# Patient Record
Sex: Female | Born: 1945 | Race: Black or African American | Hispanic: No | Smoking: Former smoker
Health system: Southern US, Community
[De-identification: ages and names within clinical notes are randomized; demographics above are authoritative.]

## PROBLEM LIST (undated history)

## (undated) DIAGNOSIS — E785 Hyperlipidemia, unspecified: Secondary | ICD-10-CM

## (undated) DIAGNOSIS — M1990 Unspecified osteoarthritis, unspecified site: Secondary | ICD-10-CM

## (undated) DIAGNOSIS — R519 Headache, unspecified: Secondary | ICD-10-CM

## (undated) DIAGNOSIS — E669 Obesity, unspecified: Secondary | ICD-10-CM

## (undated) DIAGNOSIS — F0390 Unspecified dementia without behavioral disturbance: Secondary | ICD-10-CM

## (undated) DIAGNOSIS — G629 Polyneuropathy, unspecified: Secondary | ICD-10-CM

## (undated) DIAGNOSIS — J449 Chronic obstructive pulmonary disease, unspecified: Secondary | ICD-10-CM

## (undated) DIAGNOSIS — I2510 Atherosclerotic heart disease of native coronary artery without angina pectoris: Secondary | ICD-10-CM

## (undated) DIAGNOSIS — I509 Heart failure, unspecified: Secondary | ICD-10-CM

## (undated) DIAGNOSIS — G4730 Sleep apnea, unspecified: Secondary | ICD-10-CM

## (undated) DIAGNOSIS — I10 Essential (primary) hypertension: Secondary | ICD-10-CM

## (undated) DIAGNOSIS — E119 Type 2 diabetes mellitus without complications: Secondary | ICD-10-CM

## (undated) DIAGNOSIS — K449 Diaphragmatic hernia without obstruction or gangrene: Secondary | ICD-10-CM

## (undated) DIAGNOSIS — M4800 Spinal stenosis, site unspecified: Secondary | ICD-10-CM

## (undated) DIAGNOSIS — M4316 Spondylolisthesis, lumbar region: Secondary | ICD-10-CM

## (undated) DIAGNOSIS — Z961 Presence of intraocular lens: Secondary | ICD-10-CM

## (undated) DIAGNOSIS — K219 Gastro-esophageal reflux disease without esophagitis: Secondary | ICD-10-CM

## (undated) DIAGNOSIS — I700 Atherosclerosis of aorta: Secondary | ICD-10-CM

## (undated) DIAGNOSIS — H409 Unspecified glaucoma: Secondary | ICD-10-CM

## (undated) HISTORY — PX: OOPHORECTOMY: SHX86

## (undated) HISTORY — PX: CATARACT EXTRACTION W/ INTRAOCULAR LENS  IMPLANT, BILATERAL: SHX1307

## (undated) HISTORY — PX: JOINT REPLACEMENT: SHX530

## (undated) HISTORY — PX: PELVIC LAPAROSCOPY: SHX162

## (undated) HISTORY — PX: OTHER SURGICAL HISTORY: SHX169

## (undated) HISTORY — PX: EYE SURGERY: SHX253

## (undated) HISTORY — PX: ABDOMINAL HYSTERECTOMY: SHX81

## (undated) HISTORY — PX: TUBAL LIGATION: SHX77

## (undated) HISTORY — PX: BREAST EXCISIONAL BIOPSY: SUR124

## (undated) HISTORY — DX: Diaphragmatic hernia without obstruction or gangrene: K44.9

## (undated) HISTORY — DX: Gastro-esophageal reflux disease without esophagitis: K21.9

---

## 2005-08-06 ENCOUNTER — Emergency Department: Payer: Self-pay | Admitting: Emergency Medicine

## 2006-05-04 ENCOUNTER — Emergency Department: Payer: Self-pay | Admitting: Emergency Medicine

## 2006-10-12 ENCOUNTER — Ambulatory Visit: Payer: Self-pay | Admitting: Gastroenterology

## 2006-11-10 ENCOUNTER — Other Ambulatory Visit: Payer: Self-pay

## 2006-11-10 ENCOUNTER — Inpatient Hospital Stay: Payer: Self-pay | Admitting: Internal Medicine

## 2006-11-16 ENCOUNTER — Ambulatory Visit: Payer: Self-pay | Admitting: General Practice

## 2006-11-28 ENCOUNTER — Inpatient Hospital Stay: Payer: Self-pay | Admitting: General Practice

## 2007-11-26 ENCOUNTER — Other Ambulatory Visit: Payer: Self-pay

## 2007-11-26 ENCOUNTER — Emergency Department: Payer: Self-pay | Admitting: Internal Medicine

## 2008-09-09 ENCOUNTER — Emergency Department: Payer: Self-pay | Admitting: Emergency Medicine

## 2008-10-28 ENCOUNTER — Emergency Department: Payer: Self-pay | Admitting: Emergency Medicine

## 2009-06-02 ENCOUNTER — Emergency Department: Payer: Self-pay | Admitting: Emergency Medicine

## 2009-06-19 ENCOUNTER — Ambulatory Visit: Payer: Self-pay | Admitting: Orthopedic Surgery

## 2009-09-23 ENCOUNTER — Emergency Department: Payer: Self-pay | Admitting: Emergency Medicine

## 2010-01-12 ENCOUNTER — Ambulatory Visit: Payer: Self-pay | Admitting: Internal Medicine

## 2010-12-24 ENCOUNTER — Ambulatory Visit: Payer: Self-pay | Admitting: Internal Medicine

## 2011-01-14 ENCOUNTER — Ambulatory Visit: Payer: Self-pay | Admitting: Internal Medicine

## 2011-02-09 ENCOUNTER — Ambulatory Visit: Payer: Self-pay | Admitting: Specialist

## 2011-03-09 ENCOUNTER — Ambulatory Visit: Payer: Self-pay | Admitting: Specialist

## 2011-06-23 ENCOUNTER — Observation Stay: Payer: Self-pay | Admitting: Internal Medicine

## 2011-10-03 ENCOUNTER — Ambulatory Visit: Payer: Self-pay | Admitting: Physician Assistant

## 2012-06-11 ENCOUNTER — Ambulatory Visit: Payer: Self-pay | Admitting: Internal Medicine

## 2012-07-10 ENCOUNTER — Ambulatory Visit: Payer: Self-pay | Admitting: Gastroenterology

## 2012-07-12 LAB — PATHOLOGY REPORT

## 2013-01-10 ENCOUNTER — Other Ambulatory Visit: Payer: Self-pay | Admitting: Gastroenterology

## 2013-01-10 DIAGNOSIS — R1310 Dysphagia, unspecified: Secondary | ICD-10-CM

## 2013-01-17 ENCOUNTER — Ambulatory Visit
Admission: RE | Admit: 2013-01-17 | Discharge: 2013-01-17 | Disposition: A | Discharge status: Home / Self Care | Payer: Medicare HMO | Source: Ambulatory Visit | Attending: Gastroenterology | Admitting: Gastroenterology

## 2013-01-17 DIAGNOSIS — R1310 Dysphagia, unspecified: Secondary | ICD-10-CM

## 2013-01-22 ENCOUNTER — Ambulatory Visit: Payer: Self-pay | Admitting: Gastroenterology

## 2013-01-24 LAB — PATHOLOGY REPORT

## 2013-07-17 ENCOUNTER — Ambulatory Visit: Payer: Self-pay | Admitting: Internal Medicine

## 2013-12-09 ENCOUNTER — Ambulatory Visit: Payer: Self-pay | Admitting: Physical Medicine and Rehabilitation

## 2014-04-13 ENCOUNTER — Emergency Department: Payer: Self-pay | Admitting: Emergency Medicine

## 2014-04-13 LAB — URINALYSIS, COMPLETE
Bilirubin,UR: NEGATIVE
Blood: NEGATIVE
Glucose,UR: NEGATIVE mg/dL (ref 0–75)
Ketone: NEGATIVE
Leukocyte Esterase: NEGATIVE
Nitrite: NEGATIVE
Ph: 5 (ref 4.5–8.0)
Protein: NEGATIVE
RBC,UR: 7 /HPF (ref 0–5)
Specific Gravity: 1.026 (ref 1.003–1.030)
Squamous Epithelial: 5
WBC UR: 9 /HPF (ref 0–5)

## 2014-04-13 LAB — CBC WITH DIFFERENTIAL/PLATELET
Basophil #: 0.1 10*3/uL (ref 0.0–0.1)
Basophil %: 1 %
Eosinophil #: 0.2 10*3/uL (ref 0.0–0.7)
Eosinophil %: 1.9 %
HCT: 43.5 % (ref 35.0–47.0)
HGB: 14 g/dL (ref 12.0–16.0)
Lymphocyte #: 3.4 10*3/uL (ref 1.0–3.6)
Lymphocyte %: 41.8 %
MCH: 27.2 pg (ref 26.0–34.0)
MCHC: 32.2 g/dL (ref 32.0–36.0)
MCV: 85 fL (ref 80–100)
Monocyte #: 0.9 x10 3/mm (ref 0.2–0.9)
Monocyte %: 11.5 %
Neutrophil #: 3.6 10*3/uL (ref 1.4–6.5)
Neutrophil %: 43.8 %
Platelet: 261 10*3/uL (ref 150–440)
RBC: 5.14 10*6/uL (ref 3.80–5.20)
RDW: 16.6 % — ABNORMAL HIGH (ref 11.5–14.5)
WBC: 8.2 10*3/uL (ref 3.6–11.0)

## 2014-04-13 LAB — COMPREHENSIVE METABOLIC PANEL
Albumin: 3.4 g/dL (ref 3.4–5.0)
Alkaline Phosphatase: 84 U/L
Anion Gap: 10 (ref 7–16)
BUN: 10 mg/dL (ref 7–18)
Bilirubin,Total: 0.4 mg/dL (ref 0.2–1.0)
Calcium, Total: 9.1 mg/dL (ref 8.5–10.1)
Chloride: 104 mmol/L (ref 98–107)
Co2: 23 mmol/L (ref 21–32)
Creatinine: 0.88 mg/dL (ref 0.60–1.30)
EGFR (African American): >60
EGFR (Non-African Amer.): >60
Glucose: 116 mg/dL — ABNORMAL HIGH (ref 65–99)
Osmolality: 274 (ref 275–301)
Potassium: 3.7 mmol/L (ref 3.5–5.1)
SGOT(AST): 23 U/L (ref 15–37)
SGPT (ALT): 29 U/L
Sodium: 137 mmol/L (ref 136–145)
Total Protein: 8 g/dL (ref 6.4–8.2)

## 2014-04-13 LAB — LIPASE, BLOOD: Lipase: 169 U/L (ref 73–393)

## 2014-04-13 LAB — TROPONIN I: Troponin-I: <0.02 ng/mL

## 2014-04-14 LAB — URINE CULTURE

## 2014-05-06 ENCOUNTER — Ambulatory Visit: Payer: Self-pay | Admitting: Gastroenterology

## 2014-05-07 LAB — PATHOLOGY REPORT

## 2014-07-21 ENCOUNTER — Ambulatory Visit: Payer: Self-pay | Admitting: Internal Medicine

## 2014-07-28 DIAGNOSIS — J449 Chronic obstructive pulmonary disease, unspecified: Secondary | ICD-10-CM | POA: Diagnosis not present

## 2014-07-28 DIAGNOSIS — E669 Obesity, unspecified: Secondary | ICD-10-CM | POA: Diagnosis not present

## 2014-07-28 DIAGNOSIS — Z01818 Encounter for other preprocedural examination: Secondary | ICD-10-CM | POA: Diagnosis not present

## 2014-07-28 DIAGNOSIS — G4733 Obstructive sleep apnea (adult) (pediatric): Secondary | ICD-10-CM | POA: Diagnosis not present

## 2014-07-29 ENCOUNTER — Ambulatory Visit: Payer: Self-pay | Admitting: Obstetrics and Gynecology

## 2014-07-29 DIAGNOSIS — G4769 Other sleep related movement disorders: Secondary | ICD-10-CM | POA: Diagnosis not present

## 2014-07-29 DIAGNOSIS — Z01818 Encounter for other preprocedural examination: Secondary | ICD-10-CM | POA: Diagnosis not present

## 2014-07-29 DIAGNOSIS — N8320 Unspecified ovarian cysts: Secondary | ICD-10-CM | POA: Diagnosis not present

## 2014-07-29 DIAGNOSIS — Z87891 Personal history of nicotine dependence: Secondary | ICD-10-CM | POA: Diagnosis not present

## 2014-07-29 DIAGNOSIS — I10 Essential (primary) hypertension: Secondary | ICD-10-CM | POA: Diagnosis not present

## 2014-07-29 DIAGNOSIS — Z01812 Encounter for preprocedural laboratory examination: Secondary | ICD-10-CM | POA: Diagnosis not present

## 2014-07-29 DIAGNOSIS — Z01810 Encounter for preprocedural cardiovascular examination: Secondary | ICD-10-CM | POA: Diagnosis not present

## 2014-07-29 LAB — BASIC METABOLIC PANEL
Anion Gap: 8 (ref 7–16)
BUN: 5 mg/dL — ABNORMAL LOW (ref 7–18)
Calcium, Total: 8.9 mg/dL (ref 8.5–10.1)
Chloride: 106 mmol/L (ref 98–107)
Co2: 27 mmol/L (ref 21–32)
Creatinine: 0.74 mg/dL (ref 0.60–1.30)
EGFR (African American): >60
EGFR (Non-African Amer.): >60
Glucose: 91 mg/dL (ref 65–99)
Osmolality: 278 (ref 275–301)
Potassium: 3.6 mmol/L (ref 3.5–5.1)
Sodium: 141 mmol/L (ref 136–145)

## 2014-07-29 LAB — CBC
HCT: 41.9 % (ref 35.0–47.0)
HGB: 13.3 g/dL (ref 12.0–16.0)
MCH: 28.5 pg (ref 26.0–34.0)
MCHC: 31.8 g/dL — ABNORMAL LOW (ref 32.0–36.0)
MCV: 90 fL (ref 80–100)
Platelet: 214 10*3/uL (ref 150–440)
RBC: 4.68 10*6/uL (ref 3.80–5.20)
RDW: 15.8 % — ABNORMAL HIGH (ref 11.5–14.5)
WBC: 5.9 10*3/uL (ref 3.6–11.0)

## 2014-08-04 ENCOUNTER — Ambulatory Visit: Payer: Self-pay | Admitting: Obstetrics and Gynecology

## 2014-08-04 DIAGNOSIS — D271 Benign neoplasm of left ovary: Secondary | ICD-10-CM | POA: Diagnosis not present

## 2014-08-04 DIAGNOSIS — R1032 Left lower quadrant pain: Secondary | ICD-10-CM | POA: Diagnosis not present

## 2014-08-04 DIAGNOSIS — D270 Benign neoplasm of right ovary: Secondary | ICD-10-CM | POA: Diagnosis not present

## 2014-08-04 DIAGNOSIS — I2510 Atherosclerotic heart disease of native coronary artery without angina pectoris: Secondary | ICD-10-CM | POA: Diagnosis not present

## 2014-08-04 DIAGNOSIS — N736 Female pelvic peritoneal adhesions (postinfective): Secondary | ICD-10-CM | POA: Diagnosis not present

## 2014-08-04 DIAGNOSIS — I252 Old myocardial infarction: Secondary | ICD-10-CM | POA: Diagnosis not present

## 2014-08-04 DIAGNOSIS — K668 Other specified disorders of peritoneum: Secondary | ICD-10-CM | POA: Diagnosis not present

## 2014-08-04 DIAGNOSIS — I10 Essential (primary) hypertension: Secondary | ICD-10-CM | POA: Diagnosis not present

## 2014-08-04 DIAGNOSIS — N8329 Other ovarian cysts: Secondary | ICD-10-CM | POA: Diagnosis not present

## 2014-08-04 DIAGNOSIS — E785 Hyperlipidemia, unspecified: Secondary | ICD-10-CM | POA: Diagnosis not present

## 2014-09-01 DIAGNOSIS — E1169 Type 2 diabetes mellitus with other specified complication: Secondary | ICD-10-CM | POA: Diagnosis not present

## 2014-09-01 DIAGNOSIS — E1149 Type 2 diabetes mellitus with other diabetic neurological complication: Secondary | ICD-10-CM | POA: Diagnosis not present

## 2014-09-01 DIAGNOSIS — I25119 Atherosclerotic heart disease of native coronary artery with unspecified angina pectoris: Secondary | ICD-10-CM | POA: Diagnosis not present

## 2014-09-01 DIAGNOSIS — J42 Unspecified chronic bronchitis: Secondary | ICD-10-CM | POA: Diagnosis not present

## 2014-11-17 LAB — SURGICAL PATHOLOGY

## 2014-11-22 DIAGNOSIS — H4011X3 Primary open-angle glaucoma, severe stage: Secondary | ICD-10-CM | POA: Diagnosis not present

## 2014-11-22 DIAGNOSIS — Z0100 Encounter for examination of eyes and vision without abnormal findings: Secondary | ICD-10-CM | POA: Diagnosis not present

## 2014-11-22 DIAGNOSIS — I10 Essential (primary) hypertension: Secondary | ICD-10-CM | POA: Diagnosis not present

## 2014-11-23 NOTE — Op Note (Signed)
PATIENT NAME:  Penny Hart, Penny Hart MR#:  [id number] DATE OF BIRTH:  04/27/1946  DATE OF PROCEDURE:  08/04/2014   PREOPERATIVE DIAGNOSIS:  Complex left ovarian cyst.   POSTOPERATIVE DIAGNOSIS:  Complex left ovarian cyst.  PROCEDURE:  1.  Laparoscopic bilateral salpingo-oophorectomy.  2.  Peritoneal washings.  3.  Lysis of adhesions.   SURGEON: Boykin Nearing, MD    FIRST ASSISTANT: Benjaman Kindler, MD   INDICATIONS: This is a 69 year old gravida 4, para 4 patient with a complex left ovarian cyst measuring 3 x 4 cm with thick septation and papillation, patient with a history of right lower quadrant pain.  CA-125 within normal limits.   DESCRIPTION OF PROCEDURE: After adequate general endotracheal anesthesia, the patient was placed in the dorsal supine position, the legs placed in the [place] stirrups. An abdominal, perineal and vaginal prep was performed with Betadine. The patient's bladder was straight catheterized yielding 75 mL of clear urine. A 12 mm infraumbilical incision was made after injecting with 0.5% Marcaine.  The laparoscope was advanced into the abdominal cavity under direct visualization with the Optiview cannula. Second port was placed 3 cm medial to the left anterior iliac spine and under direct visualization a #11 mm port was advanced into the abdominal cavity under direct visualization.  Third port was placed in the right lower quadrant, 3 cm medial to the right anterior iliac spine, and a 5 mm port was advanced into the abdominal cavity under direct visualization. Initial impression of a number of adhesions to the left ovary from the colon. These were taken down sharply with the Harmonic scalpel. There was no identifiable free fluid.  Peritoneal washings were done. Upper abdomen appeared normal. Attention was directed to the patient's left side of the pelvis and the infundibulopelvic ligament was identified as well as a normal coursing of the ureter with normal peristalsis.  Infundibulopelvic ligament was cauterized with the Kleppingers and then the Harmonic was used to transect the IP ligament. The left ovary was adhesed to the vaginal cuff and the Harmonic scalpel was used to dissect this free.  Ultimately, the ovary was freed from the sidewall. Normal ureteral peristalsis was identified after removal of the ovary.  A similar procedure was repeated on the patient's right side after cauterizing the infundibulopelvic ligament on the right, Harmonic scalpel was used to transect the ligament and dissect the right ovary from the sidewall. Each ovary was placed into an Endo Catch bag and was brought through the left lower port site, intact without rupture.  The patient's abdomen was copiously irrigated. Good hemostasis was noted and the infundibulopelvic ligament appeared to be a little bit oozy with no active bleeding, so therefore Arista was brought up to the operative field and applied to this pedicle.  Good hemostasis was noted when the intra-abdominal pressure was brought to 7 mmHg.  There were no complications. The patient's abdomen was deflated and the left lower quadrant port site was closed with a deep fascial layer with 3 separate fascial stitches applied, sutures were applied and the infraumbilical area was closed with a deep fascial layer and then all skin incisions were reapproximated with interrupted 4-0 Vicryl suture.  Dermabond was placed on the incisions and then a 2 x 2 with Tegaderm. Sponge stick was removed from the vagina which was used for aid in manipulating the vaginal vault during the procedure.   ESTIMATED BLOOD LOSS: Minimal.  INTRAOPERATIVE FLUIDS:  1000 mL   CONDITION:  The patient was taken to  the recovery room in good condition.      ____________________________ Boykin Nearing, MD tjs:DT D: 08/04/2014 12:16:39 ET T: 08/04/2014 12:49:34 ET JOB#: [id number]  cc: Boykin Nearing, MD, <Dictator> Boykin Nearing MD ELECTRONICALLY  SIGNED 08/05/2014 9:17

## 2014-12-09 DIAGNOSIS — E1149 Type 2 diabetes mellitus with other diabetic neurological complication: Secondary | ICD-10-CM | POA: Diagnosis not present

## 2014-12-09 DIAGNOSIS — M4806 Spinal stenosis, lumbar region: Secondary | ICD-10-CM | POA: Diagnosis not present

## 2014-12-09 DIAGNOSIS — E118 Type 2 diabetes mellitus with unspecified complications: Secondary | ICD-10-CM | POA: Diagnosis not present

## 2014-12-29 DIAGNOSIS — I25119 Atherosclerotic heart disease of native coronary artery with unspecified angina pectoris: Secondary | ICD-10-CM | POA: Diagnosis not present

## 2014-12-29 DIAGNOSIS — E1149 Type 2 diabetes mellitus with other diabetic neurological complication: Secondary | ICD-10-CM | POA: Diagnosis not present

## 2014-12-29 DIAGNOSIS — R6889 Other general symptoms and signs: Secondary | ICD-10-CM | POA: Diagnosis not present

## 2014-12-29 DIAGNOSIS — E1169 Type 2 diabetes mellitus with other specified complication: Secondary | ICD-10-CM | POA: Diagnosis not present

## 2014-12-29 DIAGNOSIS — J42 Unspecified chronic bronchitis: Secondary | ICD-10-CM | POA: Diagnosis not present

## 2014-12-30 DIAGNOSIS — Z6836 Body mass index (BMI) 36.0-36.9, adult: Secondary | ICD-10-CM | POA: Diagnosis not present

## 2014-12-30 DIAGNOSIS — M5416 Radiculopathy, lumbar region: Secondary | ICD-10-CM | POA: Diagnosis not present

## 2014-12-30 DIAGNOSIS — M4806 Spinal stenosis, lumbar region: Secondary | ICD-10-CM | POA: Diagnosis not present

## 2014-12-30 DIAGNOSIS — M4316 Spondylolisthesis, lumbar region: Secondary | ICD-10-CM | POA: Diagnosis not present

## 2015-01-20 ENCOUNTER — Other Ambulatory Visit: Payer: Self-pay | Admitting: Neurosurgery

## 2015-01-20 DIAGNOSIS — M4316 Spondylolisthesis, lumbar region: Secondary | ICD-10-CM

## 2015-01-28 ENCOUNTER — Ambulatory Visit: Payer: Medicare HMO

## 2015-01-28 DIAGNOSIS — I2510 Atherosclerotic heart disease of native coronary artery without angina pectoris: Secondary | ICD-10-CM | POA: Diagnosis not present

## 2015-01-28 DIAGNOSIS — E785 Hyperlipidemia, unspecified: Secondary | ICD-10-CM | POA: Diagnosis not present

## 2015-01-28 DIAGNOSIS — E669 Obesity, unspecified: Secondary | ICD-10-CM | POA: Diagnosis not present

## 2015-01-28 DIAGNOSIS — E119 Type 2 diabetes mellitus without complications: Secondary | ICD-10-CM | POA: Diagnosis not present

## 2015-03-18 ENCOUNTER — Ambulatory Visit
Admission: RE | Admit: 2015-03-18 | Discharge: 2015-03-18 | Disposition: A | Discharge status: Home / Self Care | Payer: Commercial Managed Care - HMO | Source: Ambulatory Visit | Attending: Neurosurgery | Admitting: Neurosurgery

## 2015-03-18 DIAGNOSIS — M47896 Other spondylosis, lumbar region: Secondary | ICD-10-CM | POA: Insufficient documentation

## 2015-03-18 DIAGNOSIS — M5136 Other intervertebral disc degeneration, lumbar region: Secondary | ICD-10-CM | POA: Diagnosis not present

## 2015-03-18 DIAGNOSIS — M47816 Spondylosis without myelopathy or radiculopathy, lumbar region: Secondary | ICD-10-CM | POA: Diagnosis not present

## 2015-03-18 DIAGNOSIS — M4316 Spondylolisthesis, lumbar region: Secondary | ICD-10-CM

## 2015-03-18 DIAGNOSIS — M9973 Connective tissue and disc stenosis of intervertebral foramina of lumbar region: Secondary | ICD-10-CM | POA: Diagnosis not present

## 2015-04-28 DIAGNOSIS — M1611 Unilateral primary osteoarthritis, right hip: Secondary | ICD-10-CM | POA: Diagnosis not present

## 2015-04-28 DIAGNOSIS — E785 Hyperlipidemia, unspecified: Secondary | ICD-10-CM | POA: Diagnosis not present

## 2015-04-28 DIAGNOSIS — R1032 Left lower quadrant pain: Secondary | ICD-10-CM | POA: Diagnosis not present

## 2015-04-28 DIAGNOSIS — M25551 Pain in right hip: Secondary | ICD-10-CM | POA: Diagnosis not present

## 2015-04-28 DIAGNOSIS — E1149 Type 2 diabetes mellitus with other diabetic neurological complication: Secondary | ICD-10-CM | POA: Diagnosis not present

## 2015-04-28 DIAGNOSIS — R1031 Right lower quadrant pain: Secondary | ICD-10-CM | POA: Diagnosis not present

## 2015-04-28 DIAGNOSIS — I700 Atherosclerosis of aorta: Secondary | ICD-10-CM | POA: Diagnosis not present

## 2015-04-28 DIAGNOSIS — E1169 Type 2 diabetes mellitus with other specified complication: Secondary | ICD-10-CM | POA: Diagnosis not present

## 2015-04-28 DIAGNOSIS — I25119 Atherosclerotic heart disease of native coronary artery with unspecified angina pectoris: Secondary | ICD-10-CM | POA: Diagnosis not present

## 2015-05-28 ENCOUNTER — Encounter: Payer: Self-pay | Admitting: *Deleted

## 2015-05-28 ENCOUNTER — Observation Stay
Admission: EM | Admit: 2015-05-28 | Discharge: 2015-05-30 | Disposition: A | Discharge status: Home / Self Care | Payer: Commercial Managed Care - HMO | Attending: Internal Medicine | Admitting: Internal Medicine

## 2015-05-28 DIAGNOSIS — Z90710 Acquired absence of both cervix and uterus: Secondary | ICD-10-CM | POA: Insufficient documentation

## 2015-05-28 DIAGNOSIS — R51 Headache: Secondary | ICD-10-CM | POA: Insufficient documentation

## 2015-05-28 DIAGNOSIS — G629 Polyneuropathy, unspecified: Secondary | ICD-10-CM | POA: Insufficient documentation

## 2015-05-28 DIAGNOSIS — K449 Diaphragmatic hernia without obstruction or gangrene: Secondary | ICD-10-CM | POA: Diagnosis not present

## 2015-05-28 DIAGNOSIS — Z79899 Other long term (current) drug therapy: Secondary | ICD-10-CM | POA: Diagnosis not present

## 2015-05-28 DIAGNOSIS — R9431 Abnormal electrocardiogram [ECG] [EKG]: Secondary | ICD-10-CM | POA: Diagnosis not present

## 2015-05-28 DIAGNOSIS — E119 Type 2 diabetes mellitus without complications: Secondary | ICD-10-CM | POA: Diagnosis not present

## 2015-05-28 DIAGNOSIS — I10 Essential (primary) hypertension: Secondary | ICD-10-CM | POA: Insufficient documentation

## 2015-05-28 DIAGNOSIS — Z833 Family history of diabetes mellitus: Secondary | ICD-10-CM | POA: Diagnosis not present

## 2015-05-28 DIAGNOSIS — K2970 Gastritis, unspecified, without bleeding: Secondary | ICD-10-CM | POA: Insufficient documentation

## 2015-05-28 DIAGNOSIS — Z96652 Presence of left artificial knee joint: Secondary | ICD-10-CM | POA: Insufficient documentation

## 2015-05-28 DIAGNOSIS — D120 Benign neoplasm of cecum: Secondary | ICD-10-CM | POA: Diagnosis not present

## 2015-05-28 DIAGNOSIS — D123 Benign neoplasm of transverse colon: Secondary | ICD-10-CM | POA: Diagnosis not present

## 2015-05-28 DIAGNOSIS — R112 Nausea with vomiting, unspecified: Secondary | ICD-10-CM | POA: Diagnosis not present

## 2015-05-28 DIAGNOSIS — K921 Melena: Principal | ICD-10-CM | POA: Insufficient documentation

## 2015-05-28 DIAGNOSIS — K625 Hemorrhage of anus and rectum: Secondary | ICD-10-CM | POA: Diagnosis not present

## 2015-05-28 DIAGNOSIS — R58 Hemorrhage, not elsewhere classified: Secondary | ICD-10-CM | POA: Diagnosis not present

## 2015-05-28 DIAGNOSIS — R42 Dizziness and giddiness: Secondary | ICD-10-CM

## 2015-05-28 DIAGNOSIS — R1031 Right lower quadrant pain: Secondary | ICD-10-CM | POA: Diagnosis not present

## 2015-05-28 DIAGNOSIS — Z7982 Long term (current) use of aspirin: Secondary | ICD-10-CM | POA: Diagnosis not present

## 2015-05-28 DIAGNOSIS — J449 Chronic obstructive pulmonary disease, unspecified: Secondary | ICD-10-CM | POA: Diagnosis not present

## 2015-05-28 DIAGNOSIS — Z87891 Personal history of nicotine dependence: Secondary | ICD-10-CM | POA: Diagnosis not present

## 2015-05-28 DIAGNOSIS — E785 Hyperlipidemia, unspecified: Secondary | ICD-10-CM | POA: Insufficient documentation

## 2015-05-28 DIAGNOSIS — M5136 Other intervertebral disc degeneration, lumbar region: Secondary | ICD-10-CM | POA: Diagnosis not present

## 2015-05-28 DIAGNOSIS — K5730 Diverticulosis of large intestine without perforation or abscess without bleeding: Secondary | ICD-10-CM | POA: Insufficient documentation

## 2015-05-28 DIAGNOSIS — K642 Third degree hemorrhoids: Secondary | ICD-10-CM | POA: Diagnosis not present

## 2015-05-28 DIAGNOSIS — R1115 Cyclical vomiting syndrome unrelated to migraine: Secondary | ICD-10-CM | POA: Insufficient documentation

## 2015-05-28 DIAGNOSIS — H409 Unspecified glaucoma: Secondary | ICD-10-CM | POA: Diagnosis not present

## 2015-05-28 HISTORY — DX: Hyperlipidemia, unspecified: E78.5

## 2015-05-28 HISTORY — DX: Type 2 diabetes mellitus without complications: E11.9

## 2015-05-28 HISTORY — DX: Chronic obstructive pulmonary disease, unspecified: J44.9

## 2015-05-28 HISTORY — DX: Essential (primary) hypertension: I10

## 2015-05-28 HISTORY — DX: Unspecified glaucoma: H40.9

## 2015-05-28 HISTORY — DX: Polyneuropathy, unspecified: G62.9

## 2015-05-28 LAB — COMPREHENSIVE METABOLIC PANEL
ALT: 20 U/L (ref 14–54)
AST: 23 U/L (ref 15–41)
Albumin: 4.1 g/dL (ref 3.5–5.0)
Alkaline Phosphatase: 79 U/L (ref 38–126)
Anion gap: 6 (ref 5–15)
BUN: 13 mg/dL (ref 6–20)
CO2: 27 mmol/L (ref 22–32)
Calcium: 9.3 mg/dL (ref 8.9–10.3)
Chloride: 105 mmol/L (ref 101–111)
Creatinine, Ser: 0.81 mg/dL (ref 0.44–1.00)
GFR calc Af Amer: >60 mL/min (ref >60)
GFR calc non Af Amer: >60 mL/min (ref >60)
Glucose, Bld: 100 mg/dL — ABNORMAL HIGH (ref 65–99)
Potassium: 3.6 mmol/L (ref 3.5–5.1)
Sodium: 138 mmol/L (ref 135–145)
Total Bilirubin: 0.6 mg/dL (ref 0.3–1.2)
Total Protein: 8.1 g/dL (ref 6.5–8.1)

## 2015-05-28 LAB — URINALYSIS COMPLETE WITH MICROSCOPIC (ARMC ONLY)
Bacteria, UA: NONE SEEN
Bilirubin Urine: NEGATIVE
Glucose, UA: NEGATIVE mg/dL
Hgb urine dipstick: NEGATIVE
Ketones, ur: NEGATIVE mg/dL
Leukocytes, UA: NEGATIVE
Nitrite: NEGATIVE
Protein, ur: NEGATIVE mg/dL
Specific Gravity, Urine: 1.009 (ref 1.005–1.030)
Squamous Epithelial / LPF: NONE SEEN
pH: 5 (ref 5.0–8.0)

## 2015-05-28 LAB — CBC
HCT: 41.1 % (ref 35.0–47.0)
Hemoglobin: 13.1 g/dL (ref 12.0–16.0)
MCH: 26.6 pg (ref 26.0–34.0)
MCHC: 32 g/dL (ref 32.0–36.0)
MCV: 83.1 fL (ref 80.0–100.0)
Platelets: 281 10*3/uL (ref 150–440)
RBC: 4.95 MIL/uL (ref 3.80–5.20)
RDW: 16.9 % — ABNORMAL HIGH (ref 11.5–14.5)
WBC: 7.2 10*3/uL (ref 3.6–11.0)

## 2015-05-28 MED ORDER — IOHEXOL 240 MG/ML SOLN
25.0000 mL | Freq: Once | INTRAMUSCULAR | Status: AC | PRN
  Administered 2015-05-28: 25 mL via ORAL

## 2015-05-28 NOTE — ED Provider Notes (Signed)
[facility] Emergency Department Provider Note   ____________________________________________  Time seen: 10:35 PM I have reviewed the triage vital signs and the triage nursing note.  HISTORY  Chief Complaint Rectal Bleeding   Historian Patient  HPI Penny Hart is a 69 y.o. female who is here with multiple complaints. What caused her to come over here tonight is multiple episodes of bright red blood per rectum today. She has had intermittent blood per rectum over the past one year which was attributed to hemorrhoids, however today she had much larger amount and it was associated with right lower quadrant pain. She does report a history of diverticular to low doses in the past. No fever. Pain is mild to moderate.  She is also complaining of dizziness for about 3 days. She's been having a headache over the same period time it is global. Sounds like the dizziness is both lightheadedness and possibly room spinning. She feels like she's been off balance. No weakness or numbness. No slurred speech. No facial droop.    No past medical history on file.hypertension, COPD  There are no active problems to display for this patient.   No past surgical history on file.  Current Outpatient Rx  Name  Route  Sig  Dispense  Refill  . amLODipine (NORVASC) 5 MG tablet   Oral   Take 1 tablet by mouth daily.         Marland Kitchen azelastine (ASTELIN) 0.1 % nasal spray   Each Nare   Place 1 spray into both nostrils 2 (two) times daily. Use in each nostril as directed         . calcitonin, salmon, (MIACALCIN/FORTICAL) 200 UNIT/ACT nasal spray   Alternating Nares   Place 1 spray into alternate nostrils daily.          Marland Kitchen esomeprazole (NEXIUM) 40 MG capsule   Oral   Take 40 mg by mouth daily.         . furosemide (LASIX) 20 MG tablet   Oral   Take 1 tablet by mouth daily.         Marland Kitchen gabapentin (NEURONTIN) 100 MG capsule   Oral   Take 2 capsules by mouth 3 (three)  times daily.         . hydrOXYzine (ATARAX/VISTARIL) 25 MG tablet   Oral   Take 1 tablet by mouth every 4 (four) hours as needed for itching.          Marland Kitchen ipratropium (ATROVENT) 0.03 % nasal spray   Nasal   Place 1 spray into the nose daily.         . metoprolol tartrate (LOPRESSOR) 25 MG tablet   Oral   Take 12.5 mg by mouth every morning.         . potassium chloride SA (K-DUR,KLOR-CON) 20 MEQ tablet   Oral   Take 1 tablet by mouth daily.         . pravastatin (PRAVACHOL) 10 MG tablet   Oral   Take 1 tablet by mouth every evening.           Allergies Review of patient's allergies indicates no known allergies.  No family history on file.  Social History Social History  Substance Use Topics  . Smoking status: Never Smoker   . Smokeless tobacco: Not on file  . Alcohol Use: No    Review of Systems  Constitutional: Negative for fever. Eyes: Negative for visual changes. ENT: Negative for sore throat. Cardiovascular:  Negative for chest pain. Respiratory: Negative for shortness of breath. Gastrointestinal: Negative for diarrhea. No black stools. Genitourinary: Negative for dysuria. Musculoskeletal:positive for chronic back pain. Skin: Negative for rash. Neurological: positive for headache. 10 point Review of Systems otherwise negative ____________________________________________   PHYSICAL EXAM:  VITAL SIGNS: ED Triage Vitals  Enc Vitals Group     BP 05/28/15 2020 140/72 mmHg     Pulse Rate 05/28/15 2020 62     Resp 05/28/15 2020 20     Temp 05/28/15 2020 98.2 F (36.8 C)     Temp Source 05/28/15 2020 Oral     SpO2 05/28/15 2020 99 %     Weight 05/28/15 2020 206 lb (93.441 kg)     Height 05/28/15 2020 5\' 5"  (1.651 m)     Head Cir --      Peak Flow --      Pain Score --      Pain Loc --      Pain Edu? --      Excl. in [place]? --      Constitutional: Alert and oriented. Well appearing and in no distress. Eyes: Conjunctivae are normal. PERRL.  Normal extraocular movements. ENT   Head: Normocephalic and atraumatic.   Nose: No congestion/rhinnorhea.   Mouth/Throat: Mucous membranes are moist.   Neck: No stridor. Cardiovascular/Chest: Normal rate, regular rhythm.  No murmurs, rubs, or gallops. Respiratory: Normal respiratory effort without tachypnea nor retractions. Breath sounds are clear and equal bilaterally. No wheezes/rales/rhonchi. Gastrointestinal: Soft. No distention, no guarding, no rebound. Moderate right lower quadrant tenderness to palpation.  Genitourinary/rectal:several external hemorrhoids which are not thrombosed. Bright red blood on glove from rectal exam. Nontender rectal exam.no stool in the rectal vault. Musculoskeletal: Nontender with normal range of motion in all extremities. No joint effusions.  No lower extremity tenderness.  No edema. Neurologic: no dysarthria or aphasia. No facial droop. No gross or focal neurologic deficits are appreciated. coordination is intact Skin:  Skin is warm, dry and intact. No rash noted. Psychiatric: Mood and affect are normal. Speech and behavior are normal. Patient exhibits appropriate insight and judgment.  ____________________________________________   EKG I, Lisa Roca, MD, the attending physician have personally viewed and interpreted all ECGs.  65 beats per minute. Normal sinus rhythm. Narrow QRS. Normal axis. T-wave inversions inferiorly, anteriorly and laterally. ____________________________________________  LABS (pertinent positives/negatives)  Comprehensive metabolic panel within normal limits CBC within normal limits. Hemoglobin13.1. Normal platelet count. UA negative  ____________________________________________  RADIOLOGY All Xrays were viewed by me. Imaging interpreted by Radiologist.  CT head noncontrast: Pending  CT abdomen and pelvis with contrast: Pending __________________________________________  PROCEDURES  Procedure(s)  performed: None  Critical Care performed: None  ____________________________________________   ED COURSE / ASSESSMENT AND PLAN  CONSULTATIONS: None  Pertinent labs & imaging results that were available during my care of the patient were reviewed by me and considered in my medical decision making (see chart for details).  In terms of the patient's chief complaint which is rectal bleeding, it sounds like this may likely be due to hemorrhoidal bleeding, however patient is describing an abrupt increase in the amount of bleeding, which is now associated with right lower quadrant pain. Her initial hemoglobin is stable at 13.1. She's had no hypotension or tachycardia. She's not any blood thinners. I am going to obtain a CT of the abdomen and pelvis to evaluate/investigate the right lower quadrant pain.  On review of systems she describes a subacute headache and dizziness and  problems with coordination over about 2 weeks, and I am going to obtain a head CT to rule out intracranial source. It sounds like it may actually be related to vertigo.  CT head is pending. CT abdomen and pelvis is pending. Patient care transferred to Dr. Owens Shark at shift change.   Patient / Family / Caregiver informed of clinical course, medical decision-making process, and agree with plan.   ___________________________________________   FINAL CLINICAL IMPRESSION(S) / ED DIAGNOSES   Final diagnoses:  Dizziness  Right lower quadrant pain  Rectal bleeding       Lisa Roca, MD 05/29/15 [phone]

## 2015-05-28 NOTE — ED Notes (Addendum)
Pt to triage via wheelchair.  Pt reports rectal bleeding and dizziness. Sx for 1 year.  Pt states sx worse now.  Rectal bleeding episodes x 3 today.  Pt also has right lower abd pain.  Pt also reports hx of hemorrhoids.

## 2015-05-29 ENCOUNTER — Emergency Department: Payer: Commercial Managed Care - HMO

## 2015-05-29 DIAGNOSIS — E119 Type 2 diabetes mellitus without complications: Secondary | ICD-10-CM | POA: Diagnosis not present

## 2015-05-29 DIAGNOSIS — R1031 Right lower quadrant pain: Secondary | ICD-10-CM | POA: Diagnosis not present

## 2015-05-29 DIAGNOSIS — G43A0 Cyclical vomiting, not intractable: Secondary | ICD-10-CM

## 2015-05-29 DIAGNOSIS — J449 Chronic obstructive pulmonary disease, unspecified: Secondary | ICD-10-CM | POA: Diagnosis not present

## 2015-05-29 DIAGNOSIS — K921 Melena: Secondary | ICD-10-CM | POA: Diagnosis not present

## 2015-05-29 DIAGNOSIS — R1115 Cyclical vomiting syndrome unrelated to migraine: Secondary | ICD-10-CM | POA: Insufficient documentation

## 2015-05-29 DIAGNOSIS — D120 Benign neoplasm of cecum: Secondary | ICD-10-CM | POA: Diagnosis not present

## 2015-05-29 DIAGNOSIS — I10 Essential (primary) hypertension: Secondary | ICD-10-CM | POA: Diagnosis not present

## 2015-05-29 DIAGNOSIS — K625 Hemorrhage of anus and rectum: Secondary | ICD-10-CM

## 2015-05-29 DIAGNOSIS — D123 Benign neoplasm of transverse colon: Secondary | ICD-10-CM | POA: Diagnosis not present

## 2015-05-29 DIAGNOSIS — R42 Dizziness and giddiness: Secondary | ICD-10-CM | POA: Diagnosis not present

## 2015-05-29 DIAGNOSIS — R51 Headache: Secondary | ICD-10-CM | POA: Diagnosis not present

## 2015-05-29 DIAGNOSIS — K5730 Diverticulosis of large intestine without perforation or abscess without bleeding: Secondary | ICD-10-CM | POA: Diagnosis not present

## 2015-05-29 DIAGNOSIS — E785 Hyperlipidemia, unspecified: Secondary | ICD-10-CM | POA: Diagnosis not present

## 2015-05-29 DIAGNOSIS — K642 Third degree hemorrhoids: Secondary | ICD-10-CM | POA: Diagnosis not present

## 2015-05-29 LAB — HEMOGLOBIN
Hemoglobin: 12.5 g/dL (ref 12.0–16.0)
Hemoglobin: 12.3 g/dL (ref 12.0–16.0)
Hemoglobin: 12.4 g/dL (ref 12.0–16.0)

## 2015-05-29 MED ORDER — ACETAMINOPHEN 650 MG RE SUPP: 650.0000 mg | Freq: Four times a day (QID) | RECTAL | Status: DC | PRN

## 2015-05-29 MED ORDER — PRAVASTATIN SODIUM 10 MG PO TABS
10.0000 mg | ORAL_TABLET | Freq: Every evening | ORAL | Status: DC
  Administered 2015-05-29: 10 mg via ORAL
  Filled 2015-05-29: qty 1

## 2015-05-29 MED ORDER — GABAPENTIN 100 MG PO CAPS: 200.0000 mg | ORAL_CAPSULE | Freq: Three times a day (TID) | ORAL | Status: DC

## 2015-05-29 MED ORDER — ONDANSETRON HCL 4 MG/2ML IJ SOLN: 4.0000 mg | Freq: Four times a day (QID) | INTRAMUSCULAR | Status: DC | PRN

## 2015-05-29 MED ORDER — SODIUM CHLORIDE 0.9 % IV SOLN
INTRAVENOUS | Status: DC
  Administered 2015-05-29 – 2015-05-30 (×3): via INTRAVENOUS

## 2015-05-29 MED ORDER — ACETAMINOPHEN 325 MG PO TABS
650.0000 mg | ORAL_TABLET | Freq: Four times a day (QID) | ORAL | Status: DC | PRN
  Administered 2015-05-29 – 2015-05-30 (×2): 650 mg via ORAL
  Filled 2015-05-29 (×2): qty 2

## 2015-05-29 MED ORDER — SODIUM CHLORIDE 0.9 % IV BOLUS (SEPSIS)
1000.0000 mL | Freq: Once | INTRAVENOUS | Status: AC
  Administered 2015-05-29: 1000 mL via INTRAVENOUS

## 2015-05-29 MED ORDER — PEG 3350-KCL-NABCB-NACL-NASULF 236 G PO SOLR
4000.0000 mL | Freq: Once | ORAL | Status: AC
  Administered 2015-05-29: 4000 mL via ORAL
  Filled 2015-05-29: qty 4000

## 2015-05-29 MED ORDER — PANTOPRAZOLE SODIUM 40 MG PO TBEC
40.0000 mg | DELAYED_RELEASE_TABLET | Freq: Every day | ORAL | Status: DC
  Administered 2015-05-29: 40 mg via ORAL
  Filled 2015-05-29: qty 1

## 2015-05-29 MED ORDER — PANTOPRAZOLE SODIUM 40 MG PO TBEC
40.0000 mg | DELAYED_RELEASE_TABLET | Freq: Two times a day (BID) | ORAL | Status: DC
  Administered 2015-05-29 – 2015-05-30 (×2): 40 mg via ORAL
  Filled 2015-05-29 (×2): qty 1

## 2015-05-29 MED ORDER — HYDROXYZINE HCL 25 MG PO TABS: 25.0000 mg | ORAL_TABLET | ORAL | Status: DC | PRN

## 2015-05-29 MED ORDER — SODIUM CHLORIDE 0.9 % IV BOLUS (SEPSIS): 1000.0000 mL | Freq: Once | INTRAVENOUS | Status: DC

## 2015-05-29 MED ORDER — IOHEXOL 300 MG/ML  SOLN
100.0000 mL | Freq: Once | INTRAMUSCULAR | Status: AC | PRN
  Administered 2015-05-29: 100 mL via INTRAVENOUS

## 2015-05-29 MED ORDER — IPRATROPIUM BROMIDE 0.03 % NA SOLN
1.0000 | Freq: Every day | NASAL | Status: DC
  Administered 2015-05-30: 1 via NASAL
  Filled 2015-05-29: qty 30

## 2015-05-29 NOTE — Progress Notes (Signed)
Patient ID: Penny Hart, female   DOB: Oct 30, 1945, 69 y.o.   MRN: [id number]  Called with more bleeding. Hb stable Observation.  Spoke with GI about colonoscopy for tomorrow Will write admission Note later  Dr Leslye Peer

## 2015-05-29 NOTE — Consult Note (Signed)
[facility]  [address] [place], [place] [address] Phone: [phone] Fax : [phone]  Consultation  Referring Provider:     No ref. provider found Primary Care Physician:  Kirk Ruths., MD Primary Gastroenterologist:  Dr. Gustavo Lah         Reason for Consultation:     Hematochezia  Date of Admission:  05/28/2015 Date of Consultation:  05/29/2015         HPI:   Penny Hart is a 69 y.o. female who comes in today with years of rectal bleeding. The patient reports that she bleeds frequently but noticed the blood to be more frequent recently on last few days. The patient also reports that she has had chronic vomiting with bringing up food for many months. The patient states that she told this to her primary care provider and her gastrologist who she states has not told her where her symptoms are coming from. The patient denies any unexplained weight loss. The patient came within normal hemoglobin but when she was being discharged from the emergency room she had another episode of rectal bleeding and it was decided to admit the patient. The patient has a diagnosis rectal bleeding the past with a colonoscopy in December 2013 that showed colon polyps and diverticulosis with internal hemorrhoids.  Past Medical History  Diagnosis Date  . Diabetes mellitus without complication ([place])   . Hypertension     History reviewed. No pertinent past surgical history.  Prior to Admission medications   Medication Sig Start Date End Date Taking? Authorizing Provider  amLODipine (NORVASC) 5 MG tablet Take 1 tablet by mouth daily.   Yes Historical Provider, MD  azelastine (ASTELIN) 0.1 % nasal spray Place 1 spray into both nostrils 2 (two) times daily. Use in each nostril as directed   Yes Historical Provider, MD  calcitonin, salmon, (MIACALCIN/FORTICAL) 200 UNIT/ACT nasal spray Place 1 spray into alternate nostrils daily.    Yes Historical Provider, MD  esomeprazole  (NEXIUM) 40 MG capsule Take 40 mg by mouth daily.   Yes Historical Provider, MD  furosemide (LASIX) 20 MG tablet Take 1 tablet by mouth daily.   Yes Historical Provider, MD  gabapentin (NEURONTIN) 100 MG capsule Take 2 capsules by mouth 3 (three) times daily.   Yes Historical Provider, MD  hydrOXYzine (ATARAX/VISTARIL) 25 MG tablet Take 1 tablet by mouth every 4 (four) hours as needed for itching.    Yes Historical Provider, MD  ipratropium (ATROVENT) 0.03 % nasal spray Place 1 spray into the nose daily.   Yes Historical Provider, MD  metoprolol tartrate (LOPRESSOR) 25 MG tablet Take 12.5 mg by mouth every morning.   Yes Historical Provider, MD  potassium chloride SA (K-DUR,KLOR-CON) 20 MEQ tablet Take 1 tablet by mouth daily.   Yes Historical Provider, MD  pravastatin (PRAVACHOL) 10 MG tablet Take 1 tablet by mouth every evening.   Yes Historical Provider, MD    History reviewed. No pertinent family history.   Social History  Substance Use Topics  . Smoking status: Never Smoker   . Smokeless tobacco: None  . Alcohol Use: No    Allergies as of 05/28/2015  . (No Known Allergies)    Review of Systems:    All systems reviewed and negative except where noted in HPI.   Physical Exam:  Vital signs in last 24 hours: Temp:  [98.2 F (36.8 C)] 98.2 F (36.8 C) (11/04 1045) Pulse Rate:  [45-67] 51 (11/04 1045) Resp:  [12-24] 18 (  11/04 1045) BP: (96-141)/(53-77) 122/60 mmHg (11/04 1045) SpO2:  [88 %-100 %] 97 % (11/04 1045) Weight:  [206 lb (93.441 kg)] 206 lb (93.441 kg) (11/03 2020) Last BM Date: 05/29/15 General:   Pleasant, cooperative in NAD Head:  Normocephalic and atraumatic. Eyes:   No icterus.   Conjunctiva pink. PERRLA. Ears:  Normal auditory acuity. Neck:  Supple; no masses or thyroidomegaly Lungs: Respirations even and unlabored. Lungs clear to auscultation bilaterally.   No wheezes, crackles, or rhonchi.  Heart:  Regular rate and rhythm;  Without murmur, clicks, rubs or  gallops Abdomen:  Soft, nondistended, nontender. Normal bowel sounds. No appreciable masses or hepatomegaly.  No rebound or guarding.  Rectal:  Not performed. Msk:  Symmetrical without gross deformities.  Strength  Extremities:  Without edema, cyanosis or clubbing. Neurologic:  Alert and oriented x3;  grossly normal neurologically. Skin:  Intact without significant lesions or rashes. Cervical Nodes:  No significant cervical adenopathy. Psych:  Alert and cooperative. Normal affect.  LAB RESULTS:  Recent Labs  05/28/15 2050 05/29/15 0752 05/29/15 1209  WBC 7.2  --   --   HGB 13.1 12.5 12.3  HCT 41.1  --   --   PLT 281  --   --    BMET  Recent Labs  05/28/15 2050  NA 138  K 3.6  CL 105  CO2 27  GLUCOSE 100*  BUN 13  CREATININE 0.81  CALCIUM 9.3   LFT  Recent Labs  05/28/15 2050  PROT 8.1  ALBUMIN 4.1  AST 23  ALT 20  ALKPHOS 79  BILITOT 0.6   PT/INR No results for input(s): LABPROT, INR in the last 72 hours.  STUDIES: Ct Head Wo Contrast  05/29/2015  CLINICAL DATA:  Dizziness for 3 days.  Headache.  Feels off balance. EXAM: CT HEAD WITHOUT CONTRAST TECHNIQUE: Contiguous axial images were obtained from the base of the skull through the vertex without intravenous contrast. COMPARISON:  11/26/2007 FINDINGS: Mild cerebral atrophy. No ventricular dilatation. No mass effect or midline shift. No abnormal extra-axial fluid collections. Gray-white matter junctions are distinct. Basal cisterns are not effaced. No evidence of acute intracranial hemorrhage. No depressed skull fractures. Visualized paranasal sinuses and mastoid air cells are not opacified. Vascular calcifications. IMPRESSION: No acute intracranial abnormalities. Electronically Signed   By: Lucienne Capers M.D.   On: 05/29/2015 00:47   Ct Abdomen Pelvis W Contrast  05/29/2015  CLINICAL DATA:  Bright red blood per rectum intermittently over the past year, multiple episodes tonight. Right lower quadrant pain.  EXAM: CT ABDOMEN AND PELVIS WITH CONTRAST TECHNIQUE: Multidetector CT imaging of the abdomen and pelvis was performed using the standard protocol following bolus administration of intravenous contrast. CONTRAST:  140mL OMNIPAQUE IOHEXOL 300 MG/ML  SOLN COMPARISON:  04/13/2014 FINDINGS: Lower chest: Severe emphysematous and fibrotic changes. No acute findings. Hepatobiliary: There are normal appearances of the liver, gallbladder and bile ducts. Pancreas: Normal Spleen: Normal Adrenals/Urinary Tract: The adrenals and kidneys are normal in appearance. There is no urinary calculus evident. There is no hydronephrosis or ureteral dilatation. Collecting systems and ureters appear unremarkable. Stomach/Bowel: The stomach, small bowel and colon are remarkable only for mild uncomplicated colonic diverticulosis. Vascular/Lymphatic: The abdominal aorta is normal in caliber. There is mild atherosclerotic calcification. There is no adenopathy in the abdomen or pelvis. Reproductive: Hysterectomy.  No adnexal abnormalities. Other: No ascites. No acute inflammatory changes are evident in the abdomen or pelvis. Musculoskeletal: Lower lumbar degenerative disc and facet changes. No significant  skeletal lesions. IMPRESSION: No acute findings.  Mild uncomplicated diverticulosis. Electronically Signed   By: Andreas Newport M.D.   On: 05/29/2015 00:53      Impression / Plan:   Penny Hart is a 69 y.o. y/o female with rectal bleeding who had an episode of rectal bleeding down emergency department and a slight drop in her hemoglobin. The patient reports that she was unable to keep down any liquid diet today because of vomiting. She also reports that she has vomiting off and on and does not know why she does this nor has she been told what may be the cause. The patient will be set up for an EGD and colonoscopy for tomorrow.I have discussed risks & benefits which include, but are not limited to, bleeding, infection, perforation &  drug reaction.  The patient agrees with this plan & written consent will be obtained.      Thank you for involving me in the care of this patient.        Ollen Bowl, MD  05/29/2015, 2:22 PM   Note: This dictation was prepared with Dragon dictation along with smaller phrase technology. Any transcriptional errors that result from this process are unintentional.

## 2015-05-29 NOTE — Progress Notes (Signed)
Pastoral Care and prayer provided.  °

## 2015-05-29 NOTE — H&P (Signed)
[place] [facility] [employer] NAME: Penny Hart    MR#:  [id number]  DATE OF BIRTH:  22-Oct-1945  DATE OF ADMISSION:  05/28/2015  PRIMARY CARE PHYSICIAN: Kirk Ruths., MD   REQUESTING/REFERRING PHYSICIAN: Lisa Roca  CHIEF COMPLAINT:   Chief Complaint  Patient presents with  . Rectal Bleeding    HISTORY OF PRESENT ILLNESS:  Penny Hart  is a 69 y.o. female presented to the ER with rectal bleeding going on and off for months. She states it's getting worse. Every time she goes to the bathroom she has blood. The blood is bright red with darker clots. 4-5 times yesterday and one time in the ER. She does have a history of hemorrhoids. She did have a colonoscopy in December 2013 that had 2 polyps removed and diverticulosis. She does take aspirin on a daily basis. She does have occasional abdominal pain. Some nausea. She was in the ER overnight and first hemoglobin was 13.1. When I saw her in the ER, I decided to get another hemoglobin and potentially discharge her from the ER. The patient had another episode of bleeding from the rectum and I was called to admit the patient.  PAST MEDICAL HISTORY:   Past Medical History  Diagnosis Date  . Diabetes mellitus without complication ([place])   . Hypertension   . Glaucoma   . COPD (chronic obstructive pulmonary disease) ([place])   . Neuropathy ([place])   . Hyperlipidemia     PAST SURGICAL HISTORY:   Past Surgical History  Procedure Laterality Date  . Abdominal hysterectomy    . Oophorectomy    . Left total knee replacement    . Eye surgery for glaucoma      SOCIAL HISTORY:   Social History  Substance Use Topics  . Smoking status: Former Research scientist (life sciences)  . Smokeless tobacco: Not on file  . Alcohol Use: No    FAMILY HISTORY:   Family History  Problem Relation Age of Onset  . Diabetes Mother     DRUG ALLERGIES:  No Known Allergies  REVIEW OF SYSTEMS:  CONSTITUTIONAL: No fever,  positive for fatigue. Positive for chills and sweats. Positive for weight loss 220 to 206 EYES: Sometimes vision is cloudy. Wears glasses. EARS, NOSE, AND THROAT: No tinnitus or ear pain. No sore throat. Occasional nosebleed. RESPIRATORY: Occasional cough and shortness of breath, no wheezing or hemoptysis.  CARDIOVASCULAR: Occasional chest pain, no orthopnea, edema.  GASTROINTESTINAL: Some nausea, no vomiting, occasional abdominal pain. Bright red blood per rectum with dark clots. Sometimes regurgitates food GENITOURINARY: No dysuria, hematuria.  ENDOCRINE: No polyuria, nocturia,  HEMATOLOGY: No anemia, easy bruising or bleeding SKIN: No rash or lesion. Occasional vaginal itching MUSCULOSKELETAL: Positive for arthritis joint pain  NEUROLOGIC: No tingling, numbness, weakness.  PSYCHIATRY: No anxiety or depression.   MEDICATIONS AT HOME:   Prior to Admission medications   Medication Sig Start Date End Date Taking? Authorizing Provider  amLODipine (NORVASC) 5 MG tablet Take 1 tablet by mouth daily.   Yes Historical Provider, MD  azelastine (ASTELIN) 0.1 % nasal spray Place 1 spray into both nostrils 2 (two) times daily. Use in each nostril as directed   Yes Historical Provider, MD  calcitonin, salmon, (MIACALCIN/FORTICAL) 200 UNIT/ACT nasal spray Place 1 spray into alternate nostrils daily.    Yes Historical Provider, MD  esomeprazole (NEXIUM) 40 MG capsule Take 40 mg by mouth daily.   Yes Historical Provider, MD  furosemide (LASIX) 20 MG tablet Take  1 tablet by mouth daily.   Yes Historical Provider, MD  gabapentin (NEURONTIN) 100 MG capsule Take 2 capsules by mouth 3 (three) times daily.   Yes Historical Provider, MD  hydrOXYzine (ATARAX/VISTARIL) 25 MG tablet Take 1 tablet by mouth every 4 (four) hours as needed for itching.    Yes Historical Provider, MD  ipratropium (ATROVENT) 0.03 % nasal spray Place 1 spray into the nose daily.   Yes Historical Provider, MD  metoprolol tartrate  (LOPRESSOR) 25 MG tablet Take 12.5 mg by mouth every morning.   Yes Historical Provider, MD  potassium chloride SA (K-DUR,KLOR-CON) 20 MEQ tablet Take 1 tablet by mouth daily.   Yes Historical Provider, MD  pravastatin (PRAVACHOL) 10 MG tablet Take 1 tablet by mouth every evening.   Yes Historical Provider, MD      VITAL SIGNS:  Blood pressure 118/55, pulse 59, temperature 98 F (36.7 C), temperature source Oral, resp. rate 19, height 5\' 5"  (1.651 m), weight 93.441 kg (206 lb), SpO2 94 %.  PHYSICAL EXAMINATION:  GENERAL:  69 y.o.-year-old patient lying in the bed with no acute distress.  EYES: Pupils equal, round, reactive to light and accommodation. No scleral icterus. Extraocular muscles intact.  HEENT: Head atraumatic, normocephalic. Oropharynx and nasopharynx clear.  NECK:  Supple, no jugular venous distention. No thyroid enlargement, no tenderness.  LUNGS: Normal breath sounds bilaterally, no wheezing, rales,rhonchi or crepitation. No use of accessory muscles of respiration.  CARDIOVASCULAR: S1, S2 normal. No murmurs, rubs, or gallops.  ABDOMEN: Soft, nontender, nondistended. Bowel sounds present. No organomegaly or mass.  EXTREMITIES: 2+ edema, no cyanosis, or clubbing.  NEUROLOGIC: Cranial nerves II through XII are intact. Muscle strength 5/5 in all extremities. Sensation intact. Gait not checked.  PSYCHIATRIC: The patient is alert and oriented x 3.  SKIN: No rash, lesion, or ulcer.   LABORATORY PANEL:   CBC  Recent Labs Lab 05/28/15 2050  05/29/15 1209  WBC 7.2  --   --   HGB 13.1  < > 12.3  HCT 41.1  --   --   PLT 281  --   --   < > = values in this interval not displayed. ------------------------------------------------------------------------------------------------------------------  Chemistries   Recent Labs Lab 05/28/15 2050  NA 138  K 3.6  CL 105  CO2 27  GLUCOSE 100*  BUN 13  CREATININE 0.81  CALCIUM 9.3  AST 23  ALT 20  ALKPHOS 79  BILITOT 0.6    ------------------------------------------------------------------------------------------------------------------  RADIOLOGY:  Ct Head Wo Contrast  05/29/2015  CLINICAL DATA:  Dizziness for 3 days.  Headache.  Feels off balance. EXAM: CT HEAD WITHOUT CONTRAST TECHNIQUE: Contiguous axial images were obtained from the base of the skull through the vertex without intravenous contrast. COMPARISON:  11/26/2007 FINDINGS: Mild cerebral atrophy. No ventricular dilatation. No mass effect or midline shift. No abnormal extra-axial fluid collections. Gray-white matter junctions are distinct. Basal cisterns are not effaced. No evidence of acute intracranial hemorrhage. No depressed skull fractures. Visualized paranasal sinuses and mastoid air cells are not opacified. Vascular calcifications. IMPRESSION: No acute intracranial abnormalities. Electronically Signed   By: Lucienne Capers M.D.   On: 05/29/2015 00:47   Ct Abdomen Pelvis W Contrast  05/29/2015  CLINICAL DATA:  Bright red blood per rectum intermittently over the past year, multiple episodes tonight. Right lower quadrant pain. EXAM: CT ABDOMEN AND PELVIS WITH CONTRAST TECHNIQUE: Multidetector CT imaging of the abdomen and pelvis was performed using the standard protocol following bolus administration of intravenous  contrast. CONTRAST:  165mL OMNIPAQUE IOHEXOL 300 MG/ML  SOLN COMPARISON:  04/13/2014 FINDINGS: Lower chest: Severe emphysematous and fibrotic changes. No acute findings. Hepatobiliary: There are normal appearances of the liver, gallbladder and bile ducts. Pancreas: Normal Spleen: Normal Adrenals/Urinary Tract: The adrenals and kidneys are normal in appearance. There is no urinary calculus evident. There is no hydronephrosis or ureteral dilatation. Collecting systems and ureters appear unremarkable. Stomach/Bowel: The stomach, small bowel and colon are remarkable only for mild uncomplicated colonic diverticulosis. Vascular/Lymphatic: The abdominal  aorta is normal in caliber. There is mild atherosclerotic calcification. There is no adenopathy in the abdomen or pelvis. Reproductive: Hysterectomy.  No adnexal abnormalities. Other: No ascites. No acute inflammatory changes are evident in the abdomen or pelvis. Musculoskeletal: Lower lumbar degenerative disc and facet changes. No significant skeletal lesions. IMPRESSION: No acute findings.  Mild uncomplicated diverticulosis. Electronically Signed   By: Andreas Newport M.D.   On: 05/29/2015 00:53    EKG:   Normal sinus rhythm. Flipped T waves laterally and inferiorly  IMPRESSION AND PLAN:   1. Rectal bleeding with stable hemoglobin. Since the patient did have a colonoscopy back in the end of 2013, I do not think we'll find much on evaluation. The patient is very nervous about going home now that she had another episode of bleeding. I spoke with Dr. Allen Norris gastroenterology who will evaluate for a colonoscopy tomorrow. Stop aspirin. Serial hemoglobins. 2. Type 2 diabetes diet control. 3. Essential hypertension- blood pressure on the lower side. I will hold antihypertensive medications and give gentle IV fluid hydration. 4. COPD- respiratory status stable. 5. Neuropathy- patient told the nurse that she does not take gabapentin anymore. 6. Hyperlipidemia unspecified- continue pravastatin.  All the records are reviewed and case discussed with ED provider. Management plans discussed with the patient, family and they are in agreement.  CODE STATUS: full code  TOTAL TIME TAKING CARE OF THIS PATIENT: 55 minutes.   Loletha Grayer M.D on 05/29/2015 at 4:15 PM, patient seen this morning.  Between 7am to 6pm - Pager - [phone]  After 6pm call admission pager [facility]  Office  [phone]  CC: Primary care physician; Kirk Ruths., MD

## 2015-05-29 NOTE — Care Management Obs Status (Signed)
[facility] NOTIFICATION   Patient Details  Name: Penny Hart MRN: [id number] Date of Birth: Sep 17, 1945   Medicare Observation Status Notification Given:   Given to PT. In ER [place], RN 05/29/2015, 9:49 AM

## 2015-05-29 NOTE — Progress Notes (Signed)
Patient ID: MONROE TOURE, female   DOB: 1945/10/14, 69 y.o.   MRN: [id number]  Called to admit patient for rectal bleeding.  She has been bleeding for months on and off and has hx of hemmrhoids. Colonoscopy done 07/10/3012- 2 polyps Spoke with Dr Allen Norris GI covering  Suggest repeat hemoglobin stat and fluid bolus  If hemoglobin stable then can be discharged and f/u with Dr Gustavo Lah as outpatient Stop aspirin Can consider meds for hemmrhoids upon d/c  ER physician will handle discharge. Full Note by Me to follow.  Dr Loletha Grayer

## 2015-05-29 NOTE — ED Provider Notes (Signed)
-----------------------------------------   8:59 AM on 05/29/2015 -----------------------------------------  Hemoglobin on repeat 12.5. At this point think likely hemorrhoid bleed. Had colonoscopy in 2013. Vital signs without tachycardia or hypotension. Will plan on discharging home with return precautions.   Nance Pear, MD 05/29/15 [id number]

## 2015-05-29 NOTE — Plan of Care (Addendum)
Problem: Education: Goal: Knowledge of [facility] Education information/materials will improve Outcome: Progressing IVF infusing. Checking HGB q8h. Trending down. Pt vomited 1x. zofran ordered PRN, declines at this time. C/o headache, tylenol given 1x. Pt stated relief.  Problem: Bowel/Gastric: Goal: Will show no signs and symptoms of gastrointestinal bleeding Outcome: Not Progressing Pt having bright red bloody bowel movements. Dr. Allen Norris to do EGD and colonoscopy tomorrow. Consents signed

## 2015-05-29 NOTE — ED Notes (Signed)
Pt placed on 2L O2 via [place].  Pt sleeping and sats are 88%

## 2015-05-30 ENCOUNTER — Encounter
Admission: EM | Disposition: A | Discharge status: Home / Self Care | Payer: Self-pay | Source: Home / Self Care | Attending: Emergency Medicine

## 2015-05-30 ENCOUNTER — Encounter: Payer: Self-pay | Admitting: *Deleted

## 2015-05-30 ENCOUNTER — Observation Stay: Payer: Commercial Managed Care - HMO | Admitting: Anesthesiology

## 2015-05-30 DIAGNOSIS — K625 Hemorrhage of anus and rectum: Secondary | ICD-10-CM | POA: Diagnosis not present

## 2015-05-30 DIAGNOSIS — K921 Melena: Secondary | ICD-10-CM | POA: Diagnosis not present

## 2015-05-30 DIAGNOSIS — K2970 Gastritis, unspecified, without bleeding: Secondary | ICD-10-CM | POA: Diagnosis not present

## 2015-05-30 DIAGNOSIS — D120 Benign neoplasm of cecum: Secondary | ICD-10-CM

## 2015-05-30 DIAGNOSIS — K449 Diaphragmatic hernia without obstruction or gangrene: Secondary | ICD-10-CM

## 2015-05-30 DIAGNOSIS — K635 Polyp of colon: Secondary | ICD-10-CM | POA: Diagnosis not present

## 2015-05-30 DIAGNOSIS — K5730 Diverticulosis of large intestine without perforation or abscess without bleeding: Secondary | ICD-10-CM | POA: Insufficient documentation

## 2015-05-30 DIAGNOSIS — K649 Unspecified hemorrhoids: Secondary | ICD-10-CM | POA: Diagnosis not present

## 2015-05-30 DIAGNOSIS — D123 Benign neoplasm of transverse colon: Secondary | ICD-10-CM | POA: Diagnosis not present

## 2015-05-30 DIAGNOSIS — E119 Type 2 diabetes mellitus without complications: Secondary | ICD-10-CM | POA: Diagnosis not present

## 2015-05-30 DIAGNOSIS — G43A0 Cyclical vomiting, not intractable: Secondary | ICD-10-CM | POA: Diagnosis not present

## 2015-05-30 DIAGNOSIS — K642 Third degree hemorrhoids: Secondary | ICD-10-CM | POA: Diagnosis not present

## 2015-05-30 DIAGNOSIS — E785 Hyperlipidemia, unspecified: Secondary | ICD-10-CM | POA: Diagnosis not present

## 2015-05-30 DIAGNOSIS — I10 Essential (primary) hypertension: Secondary | ICD-10-CM | POA: Diagnosis not present

## 2015-05-30 DIAGNOSIS — J449 Chronic obstructive pulmonary disease, unspecified: Secondary | ICD-10-CM | POA: Diagnosis not present

## 2015-05-30 HISTORY — PX: COLONOSCOPY WITH PROPOFOL: SHX5780

## 2015-05-30 HISTORY — PX: ESOPHAGOGASTRODUODENOSCOPY (EGD) WITH PROPOFOL: SHX5813

## 2015-05-30 LAB — BASIC METABOLIC PANEL
Anion gap: 3 — ABNORMAL LOW (ref 5–15)
BUN: <5 mg/dL — ABNORMAL LOW (ref 6–20)
CO2: 25 mmol/L (ref 22–32)
Calcium: 8.6 mg/dL — ABNORMAL LOW (ref 8.9–10.3)
Chloride: 112 mmol/L — ABNORMAL HIGH (ref 101–111)
Creatinine, Ser: 0.69 mg/dL (ref 0.44–1.00)
GFR calc Af Amer: >60 mL/min (ref >60)
GFR calc non Af Amer: >60 mL/min (ref >60)
Glucose, Bld: 99 mg/dL (ref 65–99)
Potassium: 3.5 mmol/L (ref 3.5–5.1)
Sodium: 140 mmol/L (ref 135–145)

## 2015-05-30 LAB — CBC
HCT: 35.5 % (ref 35.0–47.0)
Hemoglobin: 11.6 g/dL — ABNORMAL LOW (ref 12.0–16.0)
MCH: 27.1 pg (ref 26.0–34.0)
MCHC: 32.7 g/dL (ref 32.0–36.0)
MCV: 82.9 fL (ref 80.0–100.0)
Platelets: 230 10*3/uL (ref 150–440)
RBC: 4.28 MIL/uL (ref 3.80–5.20)
RDW: 16.3 % — ABNORMAL HIGH (ref 11.5–14.5)
WBC: 5.8 10*3/uL (ref 3.6–11.0)

## 2015-05-30 LAB — HEMOGLOBIN: Hemoglobin: 11.5 g/dL — ABNORMAL LOW (ref 12.0–16.0)

## 2015-05-30 SURGERY — ESOPHAGOGASTRODUODENOSCOPY (EGD) WITH PROPOFOL
Anesthesia: General

## 2015-05-30 MED ORDER — HYDROCORTISONE 2.5 % RE CREA: 1.0000 "application " | TOPICAL_CREAM | Freq: Two times a day (BID) | RECTAL | Status: DC

## 2015-05-30 MED ORDER — FENTANYL CITRATE (PF) 100 MCG/2ML IJ SOLN: 25.0000 ug | INTRAMUSCULAR | Status: DC | PRN

## 2015-05-30 MED ORDER — OXYCODONE HCL 5 MG/5ML PO SOLN: 5.0000 mg | Freq: Once | ORAL | Status: DC | PRN

## 2015-05-30 MED ORDER — LIDOCAINE HCL (CARDIAC) 20 MG/ML IV SOLN
INTRAVENOUS | Status: DC | PRN
  Administered 2015-05-30: 100 mg via INTRAVENOUS

## 2015-05-30 MED ORDER — PROPOFOL 10 MG/ML IV BOLUS
INTRAVENOUS | Status: DC | PRN
  Administered 2015-05-30: 50 mg via INTRAVENOUS
  Administered 2015-05-30 (×2): 40 mg via INTRAVENOUS
  Administered 2015-05-30: 60 mg via INTRAVENOUS
  Administered 2015-05-30: 50 mg via INTRAVENOUS
  Administered 2015-05-30: 40 mg via INTRAVENOUS
  Administered 2015-05-30: 60 mg via INTRAVENOUS

## 2015-05-30 MED ORDER — OXYCODONE HCL 5 MG PO TABS: 5.0000 mg | ORAL_TABLET | Freq: Once | ORAL | Status: DC | PRN

## 2015-05-30 NOTE — Transfer of Care (Signed)
Immediate Anesthesia Transfer of Care Note  Patient: Penny Hart  Procedure(s) Performed: Procedure(s): ESOPHAGOGASTRODUODENOSCOPY (EGD) WITH PROPOFOL (N/A) COLONOSCOPY WITH PROPOFOL (N/A)  Patient Location: PACU  Anesthesia Type:General  Level of Consciousness: sedated  Airway & Oxygen Therapy: Patient Spontanous Breathing and Patient connected to nasal cannula oxygen  Post-op Assessment: Report given to RN and Post -op Vital signs reviewed and stable  Post vital signs: Reviewed and stable  Last Vitals:  Filed Vitals:   05/30/15 0737  BP: 138/78  Pulse: 81  Temp: 36.3 C  Resp: 18    Complications: No apparent anesthesia complications

## 2015-05-30 NOTE — Progress Notes (Signed)
Oral and written EDG and colonscopy pictures/reports given to pt with explanation given. Oral and written AVS instructions given to pt with stated understanding. Dr. Allen Norris in to see pt and explained findings with referral to PCP and surgical consult desired. Tylenol x 1 with relief for HA. Intermittent pain in RLQ resolved. Post care procedure instructions given with stated understanding. Advised pt to eat soft foods, easy to digest foods, rest x 24 hours and avoid driving and activities where she has to be mentally alert- stated she understood. HGB stable. No bleeding this shift noted.

## 2015-05-30 NOTE — Op Note (Signed)
[facility] Patient Name: Penny Hart Procedure Date: 05/30/2015 7:55 AM MRN: [id number] Account #: [id number] Date of Birth: [birth year] Admit Type: Inpatient Age: 69 Room: [employer] ENDO ROOM 4 Gender: Female Note Status: Finalized Procedure:         Upper GI endoscopy Indications:       Hematochezia Providers:         Lucilla Lame, MD Referring MD:      Ocie Cornfield. Ouida Sills, MD (Referring MD) Medicines:         Propofol per Anesthesia Complications:     No immediate complications. Procedure:         Pre-Anesthesia Assessment:                    - Prior to the procedure, a History and Physical was                     performed, and patient medications and allergies were                     reviewed. The patient's tolerance of previous anesthesia                     was also reviewed. The risks and benefits of the procedure                     and the sedation options and risks were discussed with the                     patient. All questions were answered, and informed consent                     was obtained. Prior Anticoagulants: The patient has taken                     no previous anticoagulant or antiplatelet agents. ASA                     Grade Assessment: II - A patient with mild systemic                     disease. After reviewing the risks and benefits, the                     patient was deemed in satisfactory condition to undergo                     the procedure.                    After obtaining informed consent, the endoscope was passed                     under direct vision. Throughout the procedure, the                     patient's blood pressure, pulse, and oxygen saturations                     were monitored continuously. The Endoscope was introduced                     through the mouth, and advanced to the second part of  duodenum. The upper GI endoscopy was accomplished without   difficulty. The patient tolerated the procedure well. Findings:      A small hiatus hernia was present.      Localized moderate inflammation characterized by erythema was found in       the gastric antrum.      The examined duodenum was normal. Impression:        - Small hiatus hernia.                    - Gastritis.                    - Normal examined duodenum.                    - No specimens collected.                    - No sign of activee or recent bleeding. Recommendation:    - Perform a colonoscopy today. Procedure Code(s): --- Professional ---                    [phone], Esophagogastroduodenoscopy, flexible, transoral;                     diagnostic, including collection of specimen(s) by                     brushing or washing, when performed (separate procedure) Diagnosis Code(s): --- Professional ---                    K92.1, Melena                    K44.9, Diaphragmatic hernia without obstruction or gangrene                    K29.70, Gastritis, unspecified, without bleeding CPT copyright 2014 American Medical Association. All rights reserved. The codes documented in this report are preliminary and upon coder review may  be revised to meet current compliance requirements. Lucilla Lame, MD 05/30/2015 8:06:35 AM This report has been signed electronically. Number of Addenda: 0 Note Initiated On: 05/30/2015 7:55 AM      [facility]

## 2015-05-30 NOTE — Anesthesia Preprocedure Evaluation (Addendum)
Anesthesia Evaluation  Patient identified by MRN, date of birth, ID band Patient awake    Reviewed: Allergy & Precautions, H&P , NPO status , Patient's Chart, lab work & pertinent test results  History of Anesthesia Complications Negative for: history of anesthetic complications  Airway Mallampati: III  TM Distance: >3 FB Neck ROM: limited    Dental  (+) Poor Dentition, Missing, Upper Dentures, Lower Dentures   Pulmonary neg shortness of breath, COPD,  COPD inhaler, former smoker,    Pulmonary exam normal breath sounds clear to auscultation       Cardiovascular Exercise Tolerance: Good hypertension, (-) angina+ CAD and + Past MI  (-) DOE Normal cardiovascular exam Rhythm:regular Rate:Normal     Neuro/Psych negative neurological ROS  negative psych ROS   GI/Hepatic Neg liver ROS, GERD  Controlled,  Endo/Other  diabetes  Renal/GU negative Renal ROS  negative genitourinary   Musculoskeletal   Abdominal   Peds  Hematology negative hematology ROS (+)   Anesthesia Other Findings Past Medical History:   Diabetes mellitus without complication (HCC)                 Hypertension                                                 Glaucoma                                                     COPD (chronic obstructive pulmonary disease) (*              Neuropathy ([facility])                                             Hyperlipidemia                                              Past Surgical History:   ABDOMINAL HYSTERECTOMY                                        OOPHORECTOMY                                                  Left total knee replacement                                   Eye surgery for glaucoma                                     BMI    Body Mass Index   34.28 kg/m 2  Reproductive/Obstetrics negative OB ROS                             Anesthesia Physical Anesthesia Plan  ASA:  III  Anesthesia Plan: General   Post-op Pain Management:    Induction:   Airway Management Planned:   Additional Equipment:   Intra-op Plan:   Post-operative Plan:   Informed Consent: I have reviewed the patients History and Physical, chart, labs and discussed the procedure including the risks, benefits and alternatives for the proposed anesthesia with the patient or authorized representative who has indicated his/her understanding and acceptance.   Dental Advisory Given  Plan Discussed with: Anesthesiologist, CRNA and Surgeon  Anesthesia Plan Comments:         Anesthesia Quick Evaluation

## 2015-05-30 NOTE — OR Nursing (Signed)
Patient from Endo upper and lower completed

## 2015-05-30 NOTE — Discharge Instructions (Signed)
°  DIET:  Regular diet  DISCHARGE CONDITION:  Stable  ACTIVITY:  Activity as tolerated  OXYGEN:  Home Oxygen: No.   Oxygen Delivery: room air  DISCHARGE LOCATION:  home    ADDITIONAL DISCHARGE INSTRUCTION:must use supposotories every day   If you experience worsening of your admission symptoms, develop shortness of breath, life threatening emergency, suicidal or homicidal thoughts you must seek medical attention immediately by calling 911 or calling your MD immediately  if symptoms less severe.  You Must read complete instructions/literature along with all the possible adverse reactions/side effects for all the Medicines you take and that have been prescribed to you. Take any new Medicines after you have completely understood and accpet all the possible adverse reactions/side effects.   Please note  You were cared for by a hospitalist during your hospital stay. If you have any questions about your discharge medications or the care you received while you were in the hospital after you are discharged, you can call the unit and asked to speak with the hospitalist on call if the hospitalist that took care of you is not available. Once you are discharged, your primary care physician will handle any further medical issues. Please note that NO REFILLS for any discharge medications will be authorized once you are discharged, as it is imperative that you return to your primary care physician (or establish a relationship with a primary care physician if you do not have one) for your aftercare needs so that they can reassess your need for medications and monitor your lab values.    Gastrointestinal Bleeding Gastrointestinal bleeding is bleeding somewhere along the path that food travels through the body (digestive tract). This path is anywhere between the mouth and the opening of the butt (anus). You may have blood in your throw up (vomit) or in your poop (stools). If there is a lot of bleeding,  you may need to stay in the hospital. [place]  Only take medicine as told by your doctor.  Eat foods with fiber such as whole grains, fruits, and vegetables. You can also try eating 1 to 3 prunes a day.  Drink enough fluids to keep your pee (urine) clear or pale yellow. GET HELP RIGHT AWAY IF:   Your bleeding gets worse.  You feel dizzy, weak, or you pass out (faint).  You have bad cramps in your back or belly (abdomen).  You have large blood clumps (clots) in your poop.  Your problems are getting worse. MAKE SURE YOU:   Understand these instructions.  Will watch your condition.  Will get help right away if you are not doing well or get worse.   This information is not intended to replace advice given to you by your health care provider. Make sure you discuss any questions you have with your health care provider.   Document Released: 04/19/2008 Document Revised: 06/27/2012 Document Reviewed: 12/29/2014 Elsevier Interactive Patient Education [employer].

## 2015-05-30 NOTE — Anesthesia Postprocedure Evaluation (Signed)
  Anesthesia Post-op Note  Patient: Penny Hart  Procedure(s) Performed: Procedure(s): ESOPHAGOGASTRODUODENOSCOPY (EGD) WITH PROPOFOL (N/A) COLONOSCOPY WITH PROPOFOL (N/A)  Anesthesia type:General  Patient location: PACU  Post pain: Pain level controlled  Post assessment: Post-op Vital signs reviewed, Patient's Cardiovascular Status Stable, Respiratory Function Stable, Patent Airway and No signs of Nausea or vomiting  Post vital signs: Reviewed and stable  Last Vitals:  Filed Vitals:   05/30/15 0923  BP: 91/49  Pulse: 54  Temp: 36.8 C  Resp: 18    Level of consciousness: awake, alert  and patient cooperative  Complications: No apparent anesthesia complications

## 2015-05-30 NOTE — Discharge Summary (Signed)
Kathyrn Sheriff, 69 y.o., DOB [birth year], MRN [id number]. Admission date: 05/28/2015 Discharge Date 05/30/2015 Primary MD Kirk Ruths., MD Admitting Physician Loletha Grayer, MD  Admission Diagnosis  Dizziness [R42] Rectal bleeding [K62.5] Right lower quadrant pain [R10.31]  Discharge Diagnosis   Active Problems:   Rectal bleeding   Non-intractable cyclical vomiting with nausea   Blood in stool   Hiatal hernia   Benign neoplasm of cecum   Benign neoplasm of transverse colon   Diverticulosis of large intestine without diverticulitis    internal hemorrhoids        Hospital Course  [place] Sherrell Puller is a 69 y.o. female presented to the ER with rectal bleeding going on and off for months. She states it's getting worse.  On further questioning patient reports that she has been recommended to him have internal hemorrhoid repair. However she has refused because this was offered in the office. Due to her persistent complaint of bleeding. She underwent a EGD which showed a mild gastritis and a colonoscopy which showed internal hemorrhoids. With some blood near the hemorrhoids. GI recommended suppositories trial if that does not help then she'll need outpatient surgical evaluation for intervention.           Consults  GI  Significant Tests:  See full reports for all details      Ct Head Wo Contrast  05/29/2015  CLINICAL DATA:  Dizziness for 3 days.  Headache.  Feels off balance. EXAM: CT HEAD WITHOUT CONTRAST TECHNIQUE: Contiguous axial images were obtained from the base of the skull through the vertex without intravenous contrast. COMPARISON:  11/26/2007 FINDINGS: Mild cerebral atrophy. No ventricular dilatation. No mass effect or midline shift. No abnormal extra-axial fluid collections. Gray-white matter junctions are distinct. Basal cisterns are not effaced. No evidence of acute intracranial hemorrhage. No depressed skull fractures. Visualized paranasal sinuses and mastoid air  cells are not opacified. Vascular calcifications. IMPRESSION: No acute intracranial abnormalities. Electronically Signed   By: Lucienne Capers M.D.   On: 05/29/2015 00:47   Ct Abdomen Pelvis W Contrast  05/29/2015  CLINICAL DATA:  Bright red blood per rectum intermittently over the past year, multiple episodes tonight. Right lower quadrant pain. EXAM: CT ABDOMEN AND PELVIS WITH CONTRAST TECHNIQUE: Multidetector CT imaging of the abdomen and pelvis was performed using the standard protocol following bolus administration of intravenous contrast. CONTRAST:  190mL OMNIPAQUE IOHEXOL 300 MG/ML  SOLN COMPARISON:  04/13/2014 FINDINGS: Lower chest: Severe emphysematous and fibrotic changes. No acute findings. Hepatobiliary: There are normal appearances of the liver, gallbladder and bile ducts. Pancreas: Normal Spleen: Normal Adrenals/Urinary Tract: The adrenals and kidneys are normal in appearance. There is no urinary calculus evident. There is no hydronephrosis or ureteral dilatation. Collecting systems and ureters appear unremarkable. Stomach/Bowel: The stomach, small bowel and colon are remarkable only for mild uncomplicated colonic diverticulosis. Vascular/Lymphatic: The abdominal aorta is normal in caliber. There is mild atherosclerotic calcification. There is no adenopathy in the abdomen or pelvis. Reproductive: Hysterectomy.  No adnexal abnormalities. Other: No ascites. No acute inflammatory changes are evident in the abdomen or pelvis. Musculoskeletal: Lower lumbar degenerative disc and facet changes. No significant skeletal lesions. IMPRESSION: No acute findings.  Mild uncomplicated diverticulosis. Electronically Signed   By: Andreas Newport M.D.   On: 05/29/2015 00:53       Today   Subjective:   Norva Karvonen  feels better no further bleeding had a EGD and colonoscopy earlier today  Objective:   Blood pressure 91/49, pulse 54, temperature 98.3  F (36.8 C), temperature source Tympanic, resp. rate  18, height 5\' 5"  (1.651 m), weight 93.441 kg (206 lb), SpO2 98 %.  .  Intake/Output Summary (Last 24 hours) at 05/30/15 1425 Last data filed at 05/30/15 1100  Gross per 24 hour  Intake   2260 ml  Output      1 ml  Net   2259 ml    Exam VITAL SIGNS: Blood pressure 91/49, pulse 54, temperature 98.3 F (36.8 C), temperature source Tympanic, resp. rate 18, height 5\' 5"  (1.651 m), weight 93.441 kg (206 lb), SpO2 98 %.  GENERAL:  69 y.o.-year-old patient lying in the bed with no acute distress.  EYES: Pupils equal, round, reactive to light and accommodation. No scleral icterus. Extraocular muscles intact.  HEENT: Head atraumatic, normocephalic. Oropharynx and nasopharynx clear.  NECK:  Supple, no jugular venous distention. No thyroid enlargement, no tenderness.  LUNGS: Normal breath sounds bilaterally, no wheezing, rales,rhonchi or crepitation. No use of accessory muscles of respiration.  CARDIOVASCULAR: S1, S2 normal. No murmurs, rubs, or gallops.  ABDOMEN: Soft, nontender, nondistended. Bowel sounds present. No organomegaly or mass.  EXTREMITIES: No pedal edema, cyanosis, or clubbing.  NEUROLOGIC: Cranial nerves II through XII are intact. Muscle strength 5/5 in all extremities. Sensation intact. Gait not checked.  PSYCHIATRIC: The patient is alert and oriented x 3.  SKIN: No obvious rash, lesion, or ulcer.   Data Review     CBC w Diff: Lab Results  Component Value Date   WBC 5.8 05/30/2015   WBC 5.9 07/29/2014   HGB 11.5* 05/30/2015   HGB 13.3 07/29/2014   HCT 35.5 05/30/2015   HCT 41.9 07/29/2014   PLT 230 05/30/2015   PLT 214 07/29/2014   LYMPHOPCT 41.8 04/13/2014   MONOPCT 11.5 04/13/2014   EOSPCT 1.9 04/13/2014   BASOPCT 1.0 04/13/2014   CMP: Lab Results  Component Value Date   NA 140 05/30/2015   NA 141 07/29/2014   K 3.5 05/30/2015   K 3.6 07/29/2014   CL 112* 05/30/2015   CL 106 07/29/2014   CO2 25 05/30/2015   CO2 27 07/29/2014   BUN <5* 05/30/2015   BUN 5*  07/29/2014   CREATININE 0.69 05/30/2015   CREATININE 0.74 07/29/2014   PROT 8.1 05/28/2015   PROT 8.0 04/13/2014   ALBUMIN 4.1 05/28/2015   ALBUMIN 3.4 04/13/2014   BILITOT 0.6 05/28/2015   BILITOT 0.4 04/13/2014   ALKPHOS 79 05/28/2015   ALKPHOS 84 04/13/2014   AST 23 05/28/2015   AST 23 04/13/2014   ALT 20 05/28/2015   ALT 29 04/13/2014  .  Micro Results No results found for this or any previous visit (from the past 240 hour(s)).      Code Status Orders        Start     Ordered   05/29/15 0931  Full code   Continuous     05/29/15 0931          Follow-up Information    Follow up with Kirk Ruths., MD In 2 days.   Specialty:  Internal Medicine   Contact information:   [place] [address] [phone]       Follow up with [facility] In 10 days.   Why:  for internal hemmorids      Discharge Medications     Medication List    TAKE these medications        amLODipine 5  MG tablet  Commonly known as:  NORVASC  Take 1 tablet by mouth daily.     azelastine 0.1 % nasal spray  Commonly known as:  ASTELIN  Place 1 spray into both nostrils 2 (two) times daily. Use in each nostril as directed     calcitonin (salmon) 200 UNIT/ACT nasal spray  Commonly known as:  MIACALCIN/FORTICAL  Place 1 spray into alternate nostrils daily.     esomeprazole 40 MG capsule  Commonly known as:  NEXIUM  Take 40 mg by mouth daily.     furosemide 20 MG tablet  Commonly known as:  LASIX  Take 1 tablet by mouth daily.     gabapentin 100 MG capsule  Commonly known as:  NEURONTIN  Take 2 capsules by mouth 3 (three) times daily.     hydrocortisone 2.5 % rectal cream  Commonly known as:  PROCTOSOL HC  Place 1 application rectally 2 (two) times daily.     hydrOXYzine 25 MG tablet  Commonly known as:  ATARAX/VISTARIL  Take 1 tablet by mouth every 4 (four) hours as needed for itching.     ipratropium  0.03 % nasal spray  Commonly known as:  ATROVENT  Place 1 spray into the nose daily.     metoprolol tartrate 25 MG tablet  Commonly known as:  LOPRESSOR  Take 12.5 mg by mouth every morning.     potassium chloride SA 20 MEQ tablet  Commonly known as:  K-DUR,KLOR-CON  Take 1 tablet by mouth daily.     pravastatin 10 MG tablet  Commonly known as:  PRAVACHOL  Take 1 tablet by mouth every evening.           Total Time in preparing paper work, data evaluation and todays exam - 35 minutes  Dustin Flock M.D on 05/30/2015 at 2:25 [facility]  [facility]   Office  [phone]

## 2015-05-30 NOTE — Plan of Care (Signed)
Problem: Education: Goal: Knowledge of [facility] Education information/materials will improve Outcome: Progressing Discussed general education regarding endo and colonoscopy, discussed prep and what would happen during the night  Problem: Bowel/Gastric: Goal: Will show no signs and symptoms of gastrointestinal bleeding Outcome: Not Progressing Patient experienced several episodes of loose bloody stools during the night, on go lytley prep

## 2015-05-30 NOTE — Op Note (Signed)
[facility] Patient Name: Penny Hart Procedure Date: 05/30/2015 7:55 AM MRN: [id number] Account #: [id number] Date of Birth: [birth year] Admit Type: Inpatient Age: 69 Room: [facility] ENDO ROOM 4 Gender: Female Note Status: Finalized Procedure:         Colonoscopy Indications:       Hematochezia Providers:         Lucilla Lame, MD Referring MD:      Ocie Cornfield. Ouida Sills, MD (Referring MD) Medicines:         Propofol per Anesthesia Complications:     No immediate complications. Procedure:         Pre-Anesthesia Assessment:                    - Prior to the procedure, a History and Physical was                     performed, and patient medications and allergies were                     reviewed. The patient's tolerance of previous anesthesia                     was also reviewed. The risks and benefits of the procedure                     and the sedation options and risks were discussed with the                     patient. All questions were answered, and informed consent                     was obtained. Prior Anticoagulants: The patient has taken                     no previous anticoagulant or antiplatelet agents. ASA                     Grade Assessment: II - A patient with mild systemic                     disease. After reviewing the risks and benefits, the                     patient was deemed in satisfactory condition to undergo                     the procedure.                    After obtaining informed consent, the colonoscope was                     passed under direct vision. Throughout the procedure, the                     patient's blood pressure, pulse, and oxygen saturations                     were monitored continuously. The Colonoscope was                     introduced through the anus and advanced to the the cecum,  identified by appendiceal orifice and ileocecal valve. The                     colonoscopy  was performed without difficulty. The patient                     tolerated the procedure well. The quality of the bowel                     preparation was excellent. Findings:      The perianal and digital rectal examinations were normal.      A 5 mm polyp was found in the cecum. The polyp was sessile. The polyp       was removed with a cold snare. Resection and retrieval were complete.      A 4 mm polyp was found in the transverse colon. The polyp was sessile.       The polyp was removed with a cold snare. Resection and retrieval were       complete.      Multiple small-mouthed diverticula were found in the sigmoid colon and       in the descending colon.      Bleeding internal hemorrhoids were found during retroflexion. The       hemorrhoids were Grade III (internal hemorrhoids that prolapse but       require manual reduction). Impression:        - One 5 mm polyp in the cecum. Resected and retrieved.                    - One 4 mm polyp in the transverse colon. Resected and                     retrieved.                    - Diverticulosis in the sigmoid colon and in the                     descending colon.                    - Bleeding internal hemorrhoids. Recommendation:    - Await pathology results. Procedure Code(s): --- Professional ---                    [phone], Colonoscopy, flexible; with removal of tumor(s),                     polyp(s), or other lesion(s) by snare technique Diagnosis Code(s): --- Professional ---                    K92.1, Melena                    D12.0, Benign neoplasm of cecum                    D12.3, Benign neoplasm of transverse colon                    K57.30, Diverticulosis of large intestine without                     perforation or abscess without bleeding CPT copyright 2014 American Medical Association. All rights reserved. The codes documented in this report are preliminary and upon coder review  may  be revised to meet current compliance  requirements. Lucilla Lame, MD 05/30/2015 8:21:39 AM This report has been signed electronically. Number of Addenda: 0 Note Initiated On: 05/30/2015 7:55 AM Scope Withdrawal Time: 0 hours 8 minutes 41 seconds  Total Procedure Duration: 0 hours 12 minutes 13 seconds       [facility]

## 2015-05-30 NOTE — Progress Notes (Signed)
Discharge home in care of dgt. Transported to private vehicle by [facility]. Advised pt and dgt that increased supervision for next 24 hours desired and stated understood.

## 2015-06-02 ENCOUNTER — Encounter: Payer: Self-pay | Admitting: Gastroenterology

## 2015-06-02 LAB — SURGICAL PATHOLOGY

## 2015-06-03 ENCOUNTER — Encounter: Payer: Self-pay | Admitting: Gastroenterology

## 2015-06-03 ENCOUNTER — Encounter: Payer: Self-pay | Admitting: *Deleted

## 2015-06-03 DIAGNOSIS — K648 Other hemorrhoids: Secondary | ICD-10-CM | POA: Diagnosis not present

## 2015-06-03 DIAGNOSIS — I25119 Atherosclerotic heart disease of native coronary artery with unspecified angina pectoris: Secondary | ICD-10-CM | POA: Diagnosis not present

## 2015-06-03 DIAGNOSIS — E1140 Type 2 diabetes mellitus with diabetic neuropathy, unspecified: Secondary | ICD-10-CM | POA: Diagnosis not present

## 2015-06-03 DIAGNOSIS — R6889 Other general symptoms and signs: Secondary | ICD-10-CM | POA: Diagnosis not present

## 2015-06-03 DIAGNOSIS — K922 Gastrointestinal hemorrhage, unspecified: Secondary | ICD-10-CM | POA: Diagnosis not present

## 2015-06-03 DIAGNOSIS — Z23 Encounter for immunization: Secondary | ICD-10-CM | POA: Diagnosis not present

## 2015-06-09 ENCOUNTER — Ambulatory Visit: Payer: Self-pay | Admitting: General Surgery

## 2015-06-16 ENCOUNTER — Ambulatory Visit (INDEPENDENT_AMBULATORY_CARE_PROVIDER_SITE_OTHER): Payer: Commercial Managed Care - HMO | Admitting: General Surgery

## 2015-06-16 ENCOUNTER — Encounter: Payer: Self-pay | Admitting: General Surgery

## 2015-06-16 VITALS — BP 128/70 | HR 58 | Resp 14 | Ht 65.0 in | Wt 206.0 lb

## 2015-06-16 DIAGNOSIS — R58 Hemorrhage, not elsewhere classified: Secondary | ICD-10-CM | POA: Diagnosis not present

## 2015-06-16 DIAGNOSIS — R6889 Other general symptoms and signs: Secondary | ICD-10-CM | POA: Diagnosis not present

## 2015-06-16 NOTE — Progress Notes (Signed)
Patient ID: Penny Hart, female   DOB: 05/11/1946, 69 y.o.   MRN: [id number]  Chief Complaint  Patient presents with  . Other    hemorrhoid    HPI Penny Hart is a 69 y.o. female here today for a evaluation of hemorrhoids. She was recent;y in the hospital for GI bleed. She had and upper and lower done at that time by Dr Allen Norris. Her bowels move daily sometimes 2-3 times a day. She has had hemorrhoids for many years but only recently has become worse. The bleeding is about every other day She has noticed bleeding even without a BM. She has lower right abdominal pain before she has the bleeding. I have reviewed the history of present illness with the patient.  HPI  Past Medical History  Diagnosis Date  . Diabetes mellitus without complication ([place])   . Hypertension   . Glaucoma   . COPD (chronic obstructive pulmonary disease) ([place])   . Neuropathy ([place])   . Hyperlipidemia   . GERD (gastroesophageal reflux disease)   . Hiatal hernia     Past Surgical History  Procedure Laterality Date  . Abdominal hysterectomy    . Oophorectomy    . Left total knee replacement    . Eye surgery for glaucoma    . Esophagogastroduodenoscopy (egd) with propofol N/A 05/30/2015    Procedure: ESOPHAGOGASTRODUODENOSCOPY (EGD) WITH PROPOFOL;  Surgeon: Lucilla Lame, MD;  Location: [facility] ENDOSCOPY;  Service: Endoscopy;  Laterality: N/A;  . Colonoscopy with propofol N/A 05/30/2015    Procedure: COLONOSCOPY WITH PROPOFOL;  Surgeon: Lucilla Lame, MD;  Location: [facility] ENDOSCOPY;  Service: Endoscopy;  Laterality: N/A;    Family History  Problem Relation Age of Onset  . Diabetes Mother     Social History Social History  Substance Use Topics  . Smoking status: Former Research scientist (life sciences)  . Smokeless tobacco: None  . Alcohol Use: No    Allergies  Allergen Reactions  . Tramadol Rash    Chest pain    Current Outpatient Prescriptions  Medication Sig Dispense Refill  . amLODipine (NORVASC) 5 MG tablet Take 1 tablet  by mouth daily.    . calcitonin, salmon, (MIACALCIN/FORTICAL) 200 UNIT/ACT nasal spray Place 1 spray into alternate nostrils daily.     . furosemide (LASIX) 20 MG tablet Take 1 tablet by mouth daily.    Marland Kitchen gabapentin (NEURONTIN) 100 MG capsule Take 2 capsules by mouth 3 (three) times daily.    . hydrocortisone (ANUSOL-HC) 25 MG suppository Place 25 mg rectally 2 (two) times daily as needed for hemorrhoids or itching.    . hydrOXYzine (ATARAX/VISTARIL) 25 MG tablet Take 1 tablet by mouth every 4 (four) hours as needed for itching.     . metoprolol tartrate (LOPRESSOR) 25 MG tablet Take 12.5 mg by mouth every morning.    . potassium chloride SA (K-DUR,KLOR-CON) 20 MEQ tablet Take 1 tablet by mouth daily.    . pravastatin (PRAVACHOL) 10 MG tablet Take 1 tablet by mouth every evening.    Marland Kitchen azelastine (ASTELIN) 0.1 % nasal spray Place 1 spray into both nostrils 2 (two) times daily. Use in each nostril as directed    . esomeprazole (NEXIUM) 40 MG capsule Take 40 mg by mouth daily.    . hydrocortisone (PROCTOSOL HC) 2.5 % rectal cream Place 1 application rectally 2 (two) times daily. (Patient not taking: Reported on 06/16/2015) 30 g 0  . ipratropium (ATROVENT) 0.03 % nasal spray Place 1 spray into the nose daily.  No current facility-administered medications for this visit.    Review of Systems Review of Systems  Constitutional: Negative.   Respiratory: Negative.   Cardiovascular: Negative.   Neurological: Positive for headaches.    Blood pressure 128/70, pulse 58, resp. rate 14, height 5\' 5"  (1.651 m), weight 206 lb (93.441 kg).  Physical Exam Physical Exam  Constitutional: She is oriented to person, place, and time. She appears well-developed and well-nourished.  HENT:  Mouth/Throat: Oropharynx is clear and moist.  Eyes: Conjunctivae are normal. No scleral icterus.  Neck: Neck supple.  Cardiovascular: Normal rate, regular rhythm and normal heart sounds.   Pulmonary/Chest: Effort  normal and breath sounds normal.  Abdominal: Soft. Normal appearance and bowel sounds are normal. There is no tenderness. No hernia.  Genitourinary:  internal  hemorrhoid at 3 o'clock location, prolapses easily   Lymphadenopathy:    She has no cervical adenopathy.  Neurological: She is alert and oriented to person, place, and time.  Skin: Skin is warm and dry.  Psychiatric: Her behavior is normal.    Data Reviewed Progress notes, CT scan and upper and lower endoscopy.  Assessment    Internal hemorrhoid, prolapsing and bleeding     Plan       Recommend banding the internal hemorrhoid in near future, patient agrees. Discussed utilizing a fleets enema prior to office visit. Will plan for anoscopy prior to banding   PCP:  Frances Furbish 06/16/2015, 4:23 PM

## 2015-06-16 NOTE — Patient Instructions (Addendum)
The patient is aware to call back for any questions or concerns.  Recommend banding the internal hemorrhoid in near future, patient agrees. Discussed utilizing a fleets enema prior to office visit.   About Hemorrhoids  Hemorrhoids are swollen veins in the lower rectum and anus.  Also called piles, hemorrhoids are a common problem.  Hemorrhoids may be internal (inside the rectum) or external (around the anus).  Internal Hemorrhoids  Internal hemorrhoids are often painless, but they rarely cause bleeding.  The internal veins may stretch and fall down (prolapse) through the anus to the outside of the body.  The veins may then become irritated and painful.  External Hemorrhoids  External hemorrhoids can be easily seen or felt around the anal opening.  They are under the skin around the anus.  When the swollen veins are scratched or broken by straining, rubbing or wiping they sometimes bleed.  How Hemorrhoids Occur  Veins in the rectum and around the anus tend to swell under pressure.  Hemorrhoids can result from increased pressure in the veins of your anus or rectum.  Some sources of pressure are:   Straining to have a bowel movement because of constipation  Waiting too long to have a bowel movement  Coughing and sneezing often  Sitting for extended periods of time, including on the toilet  Diarrhea  Obesity  Trauma or injury to the anus  Some liver diseases  Stress  Family history of hemorrhoids  Pregnancy  Pregnant women should try to avoid becoming constipated, because they are more likely to have hemorrhoids during pregnancy.  In the last trimester of pregnancy, the enlarged uterus may press on blood vessels and causes hemorrhoids.  In addition, the strain of childbirth sometimes causes hemorrhoids after the birth.  Symptoms of Hemorrhoids  Some symptoms of hemorrhoids include:  Swelling and/or a tender lump around the anus  Itching, mild burning and bleeding around  the anus  Painful bowel movements with or without constipation  Bright red blood covering the stool, on toilet paper or in the toilet bowel.   Symptoms usually go away within a few days.  Always talk to your doctor about any bleeding to make sure it is not from some other causes.  Diagnosing and Treating Hemorrhoids  Diagnosis is made by an examination by your healthcare provider.  Special test can be performed by your doctor.    Most cases of hemorrhoids can be treated with:  High-fiber diet: Eat more high-fiber foods, which help prevent constipation.  Ask for more detailed fiber information on types and sources of fiber from your healthcare provider.  Fluids: Drink plenty of water.  This helps soften bowel movements so they are easier to pass.  Sitz baths and cold packs: Sitting in lukewarm water two or three times a day for 15 minutes cleases the anal area and may relieve discomfort.  If the water is too hot, swelling around the anus will get worse.  Placing a cloth-covered ice pack on the anus for ten minutes four times a day can also help reduce selling.  Gently pushing a prolapsed hemorrhoid back inside after the bath or ice pack can be helpful.  Medications: For mild discomfort, your healthcare provider may suggest over-the-counter pain medication or prescribe a cream or ointment for topical use.  The cream may contain witch hazel, zinc oxide or petroleum jelly.  Medicated suppositories are also a treatment option.  Always consult your doctor before applying medications or creams.  Procedures and surgeries:  There are also a number of procedures and surgeries to shrink or remove hemorrhoids in more serious cases.  Talk to your physician about these options.  You can often prevent hemorrhoids or keep them from becoming worse by maintaining a healthy lifestyle.  Eat a fiber-rich diet of fruits, vegetables and whole grains.  Also, drink plenty of water and exercise regularly.   2007,  [employer] Doc.30

## 2015-07-01 ENCOUNTER — Ambulatory Visit (INDEPENDENT_AMBULATORY_CARE_PROVIDER_SITE_OTHER): Payer: Commercial Managed Care - HMO | Admitting: General Surgery

## 2015-07-01 ENCOUNTER — Encounter: Payer: Self-pay | Admitting: General Surgery

## 2015-07-01 VITALS — BP 130/70 | HR 62 | Resp 14 | Ht 65.0 in | Wt 204.0 lb

## 2015-07-01 DIAGNOSIS — K648 Other hemorrhoids: Secondary | ICD-10-CM

## 2015-07-01 DIAGNOSIS — R6889 Other general symptoms and signs: Secondary | ICD-10-CM | POA: Diagnosis not present

## 2015-07-01 NOTE — Progress Notes (Signed)
Patient is here today for anoscopy and hemorrhoid banding.  With patient in left lateral position anoscopy was completed. Single prolapsing internal hemorrhoid at 3 ocl location. No other findings.  Area was prepped with betadine. 2 ml 1% xylocaine mixed with 0.5% marcaine instileed. The prolapsing hemorrhoid was successfully banded. Procedure tolerated with very minimal discomfort. Pt advised on potential bleeding when the hemorrhoid sloughs off.  Recheck in 2-3 weeks.

## 2015-07-01 NOTE — Patient Instructions (Signed)
Avoid constipation 

## 2015-07-15 ENCOUNTER — Ambulatory Visit: Payer: Commercial Managed Care - HMO | Admitting: General Surgery

## 2015-07-29 ENCOUNTER — Ambulatory Visit (INDEPENDENT_AMBULATORY_CARE_PROVIDER_SITE_OTHER): Payer: Commercial Managed Care - HMO | Admitting: General Surgery

## 2015-07-29 ENCOUNTER — Encounter: Payer: Self-pay | Admitting: General Surgery

## 2015-07-29 VITALS — BP 150/70 | HR 72 | Resp 14 | Ht 64.0 in | Wt 211.0 lb

## 2015-07-29 DIAGNOSIS — K648 Other hemorrhoids: Secondary | ICD-10-CM | POA: Diagnosis not present

## 2015-07-29 DIAGNOSIS — R6889 Other general symptoms and signs: Secondary | ICD-10-CM | POA: Diagnosis not present

## 2015-07-29 NOTE — Progress Notes (Signed)
This is a 70 year old female here today for her two week follow up prolapsing internal hemorrhoid.  Hemorrhoid banding done on 07/01/15. Patient states she is doing well. No bleeding or pain . I have reviewed the history of present illness with the patient.  Rectal exam- small external skin tag at 3 ocl. No internal hemorrhoid seen at this site. No new findings Patient to return as needed.  This information has been scribed by Gaspar Cola CMA.  PCP:  Kirk Ruths

## 2015-07-29 NOTE — Patient Instructions (Signed)
Patient to return as needed. 

## 2015-10-09 DIAGNOSIS — E1140 Type 2 diabetes mellitus with diabetic neuropathy, unspecified: Secondary | ICD-10-CM | POA: Diagnosis not present

## 2015-10-09 DIAGNOSIS — Z6835 Body mass index (BMI) 35.0-35.9, adult: Secondary | ICD-10-CM | POA: Diagnosis not present

## 2015-10-09 DIAGNOSIS — I700 Atherosclerosis of aorta: Secondary | ICD-10-CM | POA: Diagnosis not present

## 2015-10-09 DIAGNOSIS — I10 Essential (primary) hypertension: Secondary | ICD-10-CM | POA: Diagnosis not present

## 2015-10-09 DIAGNOSIS — I25119 Atherosclerotic heart disease of native coronary artery with unspecified angina pectoris: Secondary | ICD-10-CM | POA: Diagnosis not present

## 2015-10-09 DIAGNOSIS — R6889 Other general symptoms and signs: Secondary | ICD-10-CM | POA: Diagnosis not present

## 2015-10-09 DIAGNOSIS — E785 Hyperlipidemia, unspecified: Secondary | ICD-10-CM | POA: Diagnosis not present

## 2015-10-09 DIAGNOSIS — E1169 Type 2 diabetes mellitus with other specified complication: Secondary | ICD-10-CM | POA: Diagnosis not present

## 2015-10-09 DIAGNOSIS — J42 Unspecified chronic bronchitis: Secondary | ICD-10-CM | POA: Diagnosis not present

## 2015-12-23 DIAGNOSIS — H5210 Myopia, unspecified eye: Secondary | ICD-10-CM | POA: Diagnosis not present

## 2015-12-23 DIAGNOSIS — H5202 Hypermetropia, left eye: Secondary | ICD-10-CM | POA: Diagnosis not present

## 2016-01-07 DIAGNOSIS — H401133 Primary open-angle glaucoma, bilateral, severe stage: Secondary | ICD-10-CM | POA: Diagnosis not present

## 2016-01-07 DIAGNOSIS — R6889 Other general symptoms and signs: Secondary | ICD-10-CM | POA: Diagnosis not present

## 2016-01-26 IMAGING — CT CT ABD-PELV W/ CM
1 of 2 series · 15 of 32 positions shown, 19 images · IV contrast (omnipaque)
Comparison: 04/13/2014

CLINICAL DATA: Bright red blood per rectum intermittently over the
past year, multiple episodes tonight. Right lower quadrant pain.

EXAM:
CT ABDOMEN AND PELVIS WITH CONTRAST
TECHNIQUE: Multidetector CT imaging of the abdomen and pelvis was performed
using the standard protocol following bolus administration of
intravenous contrast.
CONTRAST:  100mL OMNIPAQUE IOHEXOL 300 MG/ML  SOLN

[Series 2: routine abd pel with · axial · 0.79mm/px · z∈[-780,-345]mm · 15 of 95 slices shown, 19 images]
[im 4/95  soft-tissue]
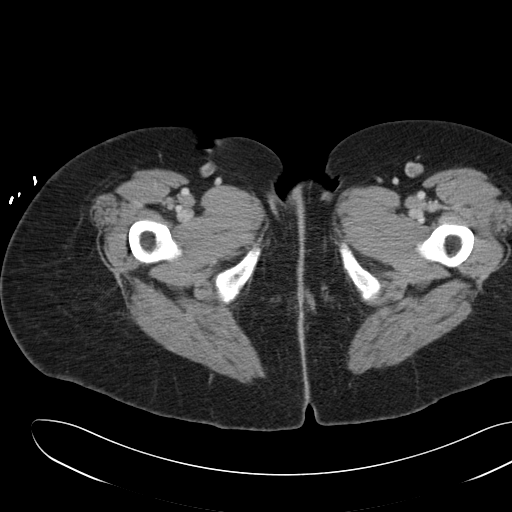
[im 4/95  bone]
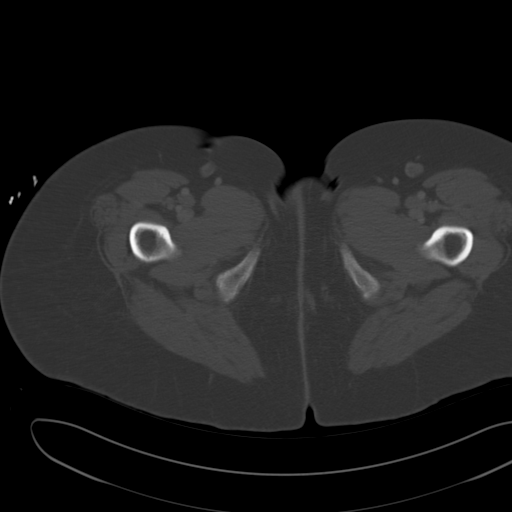
[im 12/95  soft-tissue]
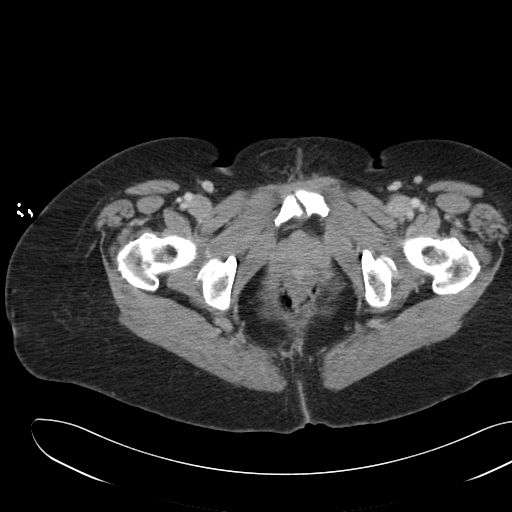
[im 20/95  soft-tissue]
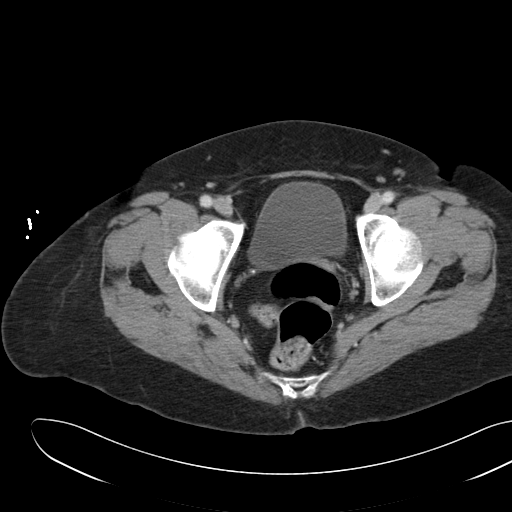
[im 28/95  soft-tissue]
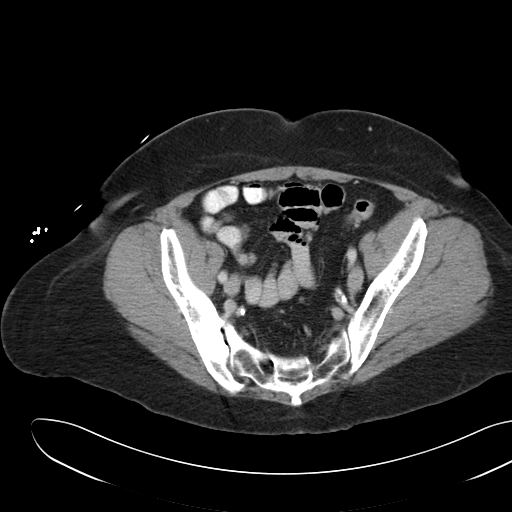
[im 32/95  soft-tissue]
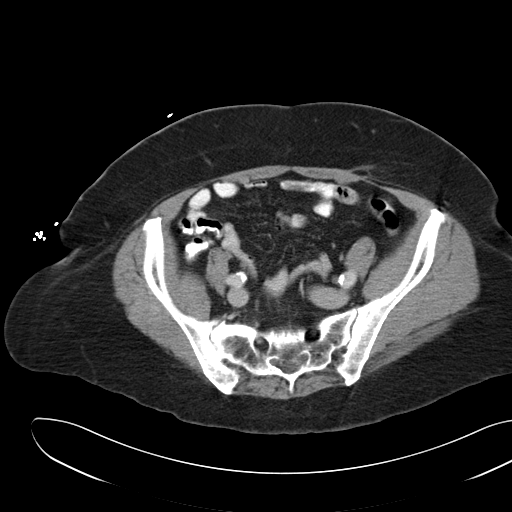
[im 40/95  soft-tissue]
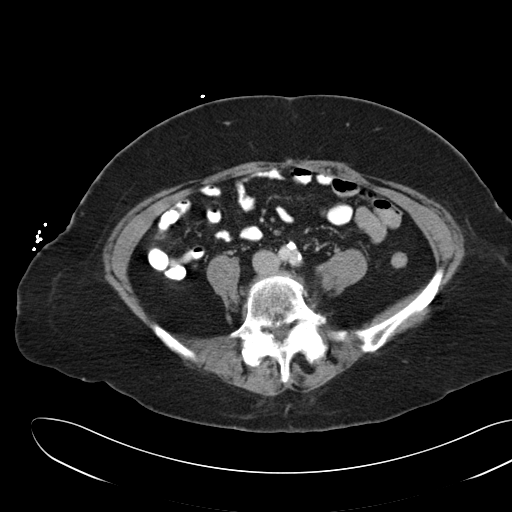
[im 48/95  soft-tissue]
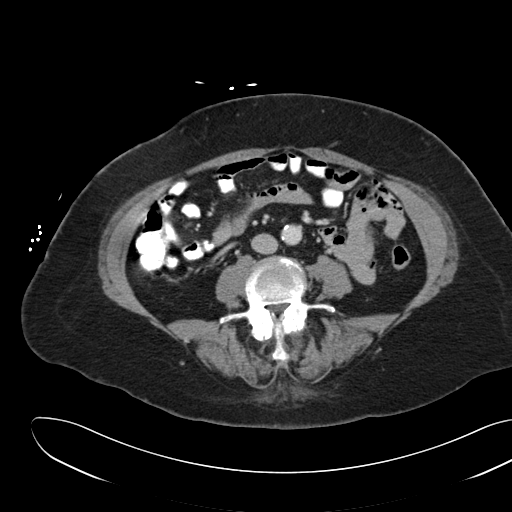
[im 55/95  soft-tissue]
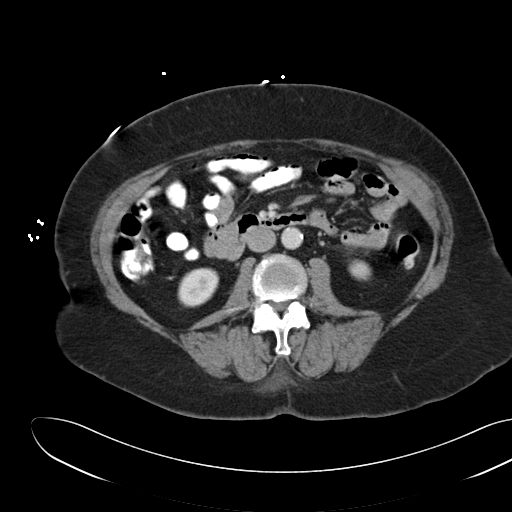
[im 63/95  soft-tissue]
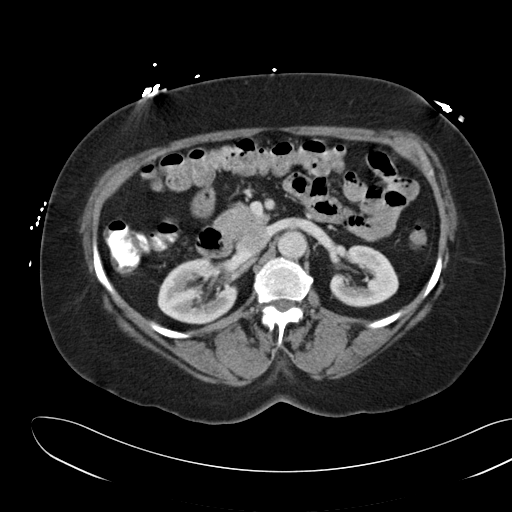
[im 63/95  bone]
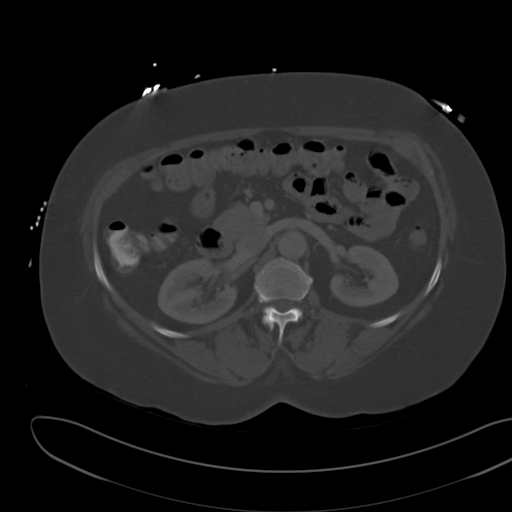
[im 67/95  soft-tissue]
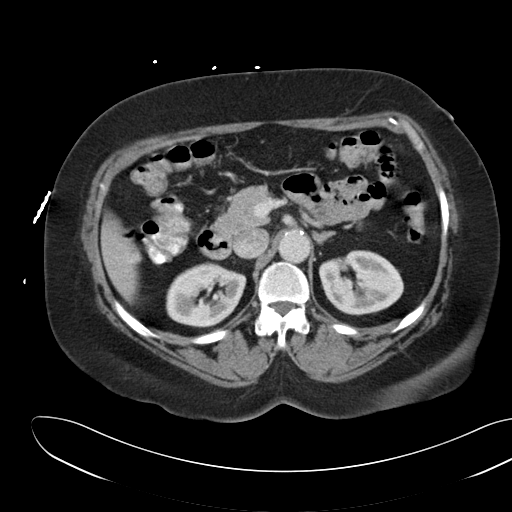
[im 75/95  soft-tissue]
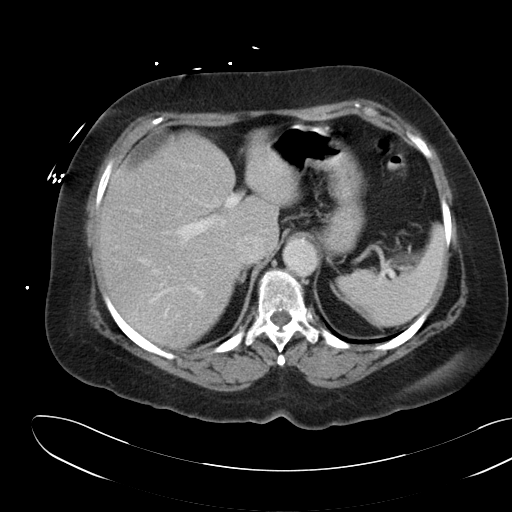
[im 79/95  lung]
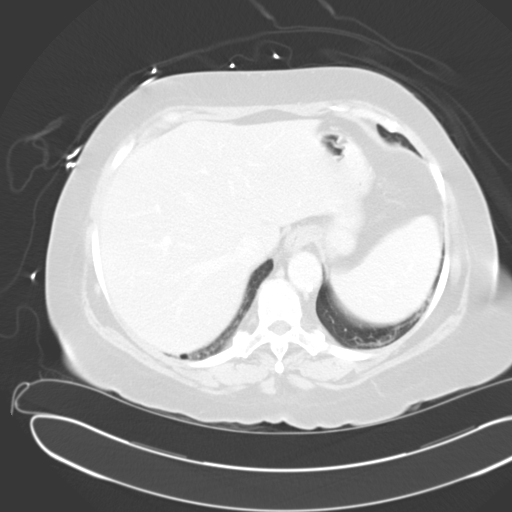
[im 83/95  soft-tissue]
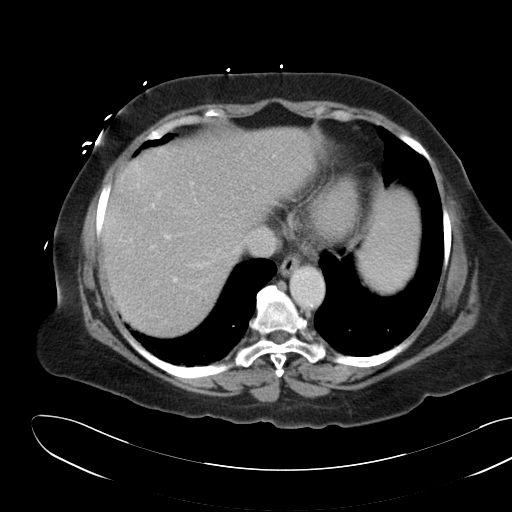
[im 83/95  lung]
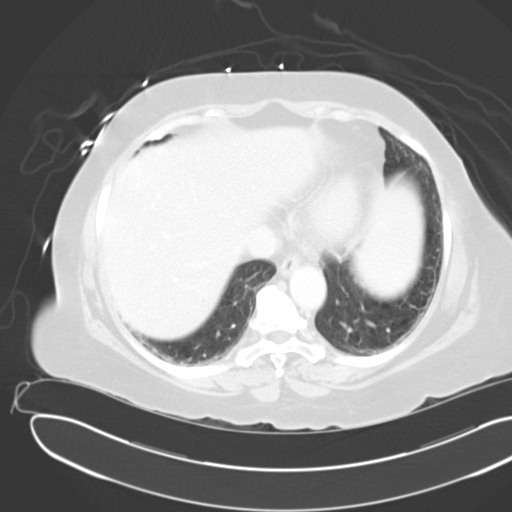
[im 87/95  lung]
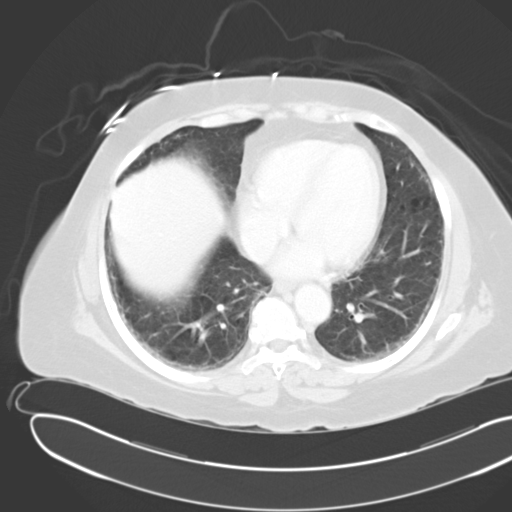
[im 91/95  soft-tissue]
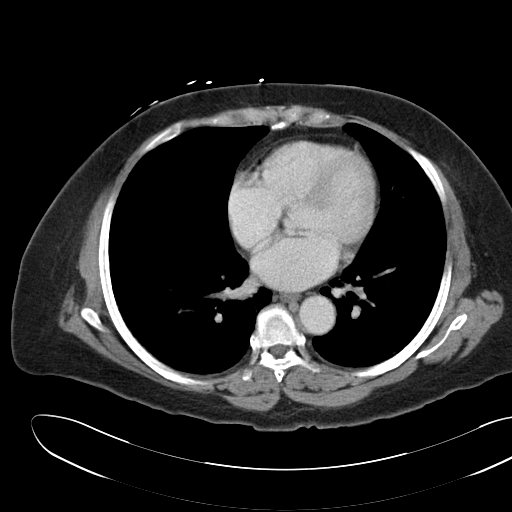
[im 91/95  lung]
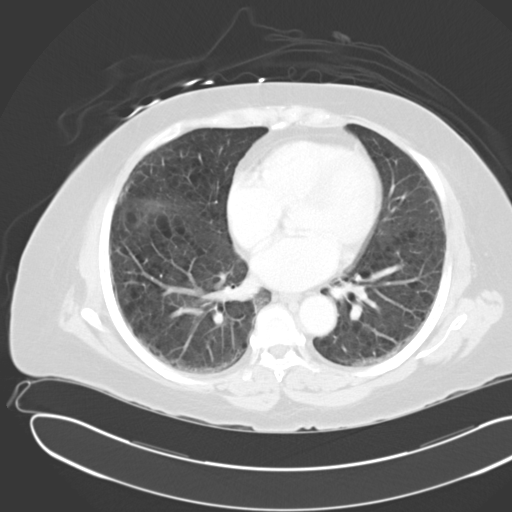

[15 of 32 positions shown; findings below may reference images not displayed]

FINDINGS: Lower chest: Severe emphysematous and fibrotic changes. No acute
findings.

Hepatobiliary: There are normal appearances of the liver,
gallbladder and bile ducts.

Pancreas: Normal

Spleen: Normal

Adrenals/Urinary Tract: The adrenals and kidneys are normal in
appearance. There is no urinary calculus evident. There is no
hydronephrosis or ureteral dilatation. Collecting systems and
ureters appear unremarkable.

Stomach/Bowel: The stomach, small bowel and colon are remarkable
only for mild uncomplicated colonic diverticulosis.

Vascular/Lymphatic: The abdominal aorta is normal in caliber. There
is mild atherosclerotic calcification. There is no adenopathy in the
abdomen or pelvis.

Reproductive: Hysterectomy.  No adnexal abnormalities.

Other: No ascites. No acute inflammatory changes are evident in the
abdomen or pelvis.

Musculoskeletal: Lower lumbar degenerative disc and facet changes.
No significant skeletal lesions.
IMPRESSION: No acute findings.  Mild uncomplicated diverticulosis.

## 2016-03-22 DIAGNOSIS — I700 Atherosclerosis of aorta: Secondary | ICD-10-CM | POA: Diagnosis not present

## 2016-03-22 DIAGNOSIS — E1169 Type 2 diabetes mellitus with other specified complication: Secondary | ICD-10-CM | POA: Diagnosis not present

## 2016-03-22 DIAGNOSIS — I10 Essential (primary) hypertension: Secondary | ICD-10-CM | POA: Diagnosis not present

## 2016-03-22 DIAGNOSIS — E1140 Type 2 diabetes mellitus with diabetic neuropathy, unspecified: Secondary | ICD-10-CM | POA: Diagnosis not present

## 2016-03-22 DIAGNOSIS — E785 Hyperlipidemia, unspecified: Secondary | ICD-10-CM | POA: Diagnosis not present

## 2016-03-29 ENCOUNTER — Other Ambulatory Visit: Payer: Self-pay | Admitting: Internal Medicine

## 2016-03-29 DIAGNOSIS — Z1231 Encounter for screening mammogram for malignant neoplasm of breast: Secondary | ICD-10-CM

## 2016-03-29 DIAGNOSIS — Z0000 Encounter for general adult medical examination without abnormal findings: Secondary | ICD-10-CM | POA: Diagnosis not present

## 2016-03-29 DIAGNOSIS — R6889 Other general symptoms and signs: Secondary | ICD-10-CM | POA: Diagnosis not present

## 2016-03-29 DIAGNOSIS — E1140 Type 2 diabetes mellitus with diabetic neuropathy, unspecified: Secondary | ICD-10-CM | POA: Diagnosis not present

## 2016-03-29 DIAGNOSIS — I25119 Atherosclerotic heart disease of native coronary artery with unspecified angina pectoris: Secondary | ICD-10-CM | POA: Diagnosis not present

## 2016-03-29 DIAGNOSIS — Z6835 Body mass index (BMI) 35.0-35.9, adult: Secondary | ICD-10-CM | POA: Diagnosis not present

## 2016-04-14 ENCOUNTER — Ambulatory Visit: Payer: Commercial Managed Care - HMO | Attending: Internal Medicine

## 2016-04-20 DIAGNOSIS — H401133 Primary open-angle glaucoma, bilateral, severe stage: Secondary | ICD-10-CM | POA: Diagnosis not present

## 2016-05-19 DIAGNOSIS — M25531 Pain in right wrist: Secondary | ICD-10-CM | POA: Diagnosis not present

## 2016-05-19 DIAGNOSIS — E1140 Type 2 diabetes mellitus with diabetic neuropathy, unspecified: Secondary | ICD-10-CM | POA: Diagnosis not present

## 2016-05-19 DIAGNOSIS — R229 Localized swelling, mass and lump, unspecified: Secondary | ICD-10-CM | POA: Diagnosis not present

## 2016-05-19 DIAGNOSIS — M19031 Primary osteoarthritis, right wrist: Secondary | ICD-10-CM | POA: Diagnosis not present

## 2016-09-02 ENCOUNTER — Emergency Department: Payer: Medicare HMO

## 2016-09-02 ENCOUNTER — Encounter: Payer: Self-pay | Admitting: Emergency Medicine

## 2016-09-02 ENCOUNTER — Emergency Department
Admission: EM | Admit: 2016-09-02 | Discharge: 2016-09-02 | Disposition: A | Discharge status: Home / Self Care | Payer: Medicare HMO | Attending: Emergency Medicine | Admitting: Emergency Medicine

## 2016-09-02 DIAGNOSIS — J449 Chronic obstructive pulmonary disease, unspecified: Secondary | ICD-10-CM | POA: Diagnosis not present

## 2016-09-02 DIAGNOSIS — M25512 Pain in left shoulder: Secondary | ICD-10-CM | POA: Diagnosis not present

## 2016-09-02 DIAGNOSIS — I10 Essential (primary) hypertension: Secondary | ICD-10-CM | POA: Insufficient documentation

## 2016-09-02 DIAGNOSIS — E119 Type 2 diabetes mellitus without complications: Secondary | ICD-10-CM | POA: Insufficient documentation

## 2016-09-02 DIAGNOSIS — R079 Chest pain, unspecified: Secondary | ICD-10-CM | POA: Diagnosis not present

## 2016-09-02 DIAGNOSIS — Z87891 Personal history of nicotine dependence: Secondary | ICD-10-CM | POA: Diagnosis not present

## 2016-09-02 DIAGNOSIS — R05 Cough: Secondary | ICD-10-CM | POA: Diagnosis not present

## 2016-09-02 DIAGNOSIS — R0789 Other chest pain: Secondary | ICD-10-CM | POA: Diagnosis not present

## 2016-09-02 DIAGNOSIS — Z79899 Other long term (current) drug therapy: Secondary | ICD-10-CM | POA: Insufficient documentation

## 2016-09-02 LAB — BASIC METABOLIC PANEL
Anion gap: 6 (ref 5–15)
BUN: UNDETERMINED mg/dL (ref 6–20)
BUN: 9 mg/dL (ref 6–20)
CO2: UNDETERMINED mmol/L (ref 22–32)
CO2: 28 mmol/L (ref 22–32)
Calcium: UNDETERMINED mg/dL (ref 8.9–10.3)
Calcium: 9.1 mg/dL (ref 8.9–10.3)
Chloride: UNDETERMINED mmol/L (ref 101–111)
Chloride: 106 mmol/L (ref 101–111)
Creatinine, Ser: UNDETERMINED mg/dL (ref 0.44–1.00)
Creatinine, Ser: 0.7 mg/dL (ref 0.44–1.00)
GFR calc Af Amer: >60 mL/min (ref >60)
GFR calc non Af Amer: >60 mL/min (ref >60)
Glucose, Bld: UNDETERMINED mg/dL (ref 65–99)
Glucose, Bld: 106 mg/dL — ABNORMAL HIGH (ref 65–99)
Potassium: UNDETERMINED mmol/L (ref 3.5–5.1)
Potassium: 3.6 mmol/L (ref 3.5–5.1)
Sodium: UNDETERMINED mmol/L (ref 135–145)
Sodium: 140 mmol/L (ref 135–145)

## 2016-09-02 LAB — CBC
HCT: 40.8 % (ref 35.0–47.0)
Hemoglobin: 13.5 g/dL (ref 12.0–16.0)
MCH: 28.2 pg (ref 26.0–34.0)
MCHC: 33 g/dL (ref 32.0–36.0)
MCV: 85.6 fL (ref 80.0–100.0)
Platelets: 226 10*3/uL (ref 150–440)
RBC: 4.77 MIL/uL (ref 3.80–5.20)
RDW: 14.9 % — ABNORMAL HIGH (ref 11.5–14.5)
WBC: 5.9 10*3/uL (ref 3.6–11.0)

## 2016-09-02 LAB — TROPONIN I
Troponin I: <0.03 ng/mL (ref <0.03)
Troponin I: <0.03 ng/mL (ref <0.03)

## 2016-09-02 MED ORDER — SUCRALFATE 1 G PO TABS: 1.0000 g | ORAL_TABLET | Freq: Four times a day (QID) | ORAL | 1 refills | Status: DC

## 2016-09-02 MED ORDER — PANTOPRAZOLE SODIUM 20 MG PO TBEC: 20.0000 mg | DELAYED_RELEASE_TABLET | Freq: Every day | ORAL | 1 refills | Status: DC

## 2016-09-02 MED ORDER — GI COCKTAIL ~~LOC~~
30.0000 mL | Freq: Once | ORAL | Status: AC
  Administered 2016-09-02: 30 mL via ORAL

## 2016-09-02 MED ORDER — GI COCKTAIL ~~LOC~~
ORAL | Status: AC
  Administered 2016-09-02: 30 mL via ORAL
  Filled 2016-09-02: qty 30

## 2016-09-02 NOTE — ED Provider Notes (Signed)
Time Seen: Approximately1625  I have reviewed the triage notes  Chief Complaint: Chest Pain   History of Present Illness: Penny Hart is a 71 y.o. female who presents with some transient chest discomfort she describes as substernal to this historian intermittent mostly related to meals over the past week. She states she has had a history of hiatal hernia and esophageal reflux disease and this feels similar. She has some left shoulder pain which she has acute attributed to a rotator cuff problem. The patient states some mild chest discomfort at this time without shortness of breath, nausea, vomiting. She denies any melena or hematochezia   Past Medical History:  Diagnosis Date  . COPD (chronic obstructive pulmonary disease) ([place])   . Diabetes mellitus without complication ([place])   . GERD (gastroesophageal reflux disease)   . Glaucoma   . Hiatal hernia   . Hyperlipidemia   . Hypertension   . Neuropathy [facility])   Patient states she has had a cardiac workup in the past which sounds like she had a chemical stress test which was negative. She has not had a heart catheterization or any stent placement  Patient Active Problem List   Diagnosis Date Noted  . Blood in stool   . Hiatal hernia   . Benign neoplasm of cecum   . Benign neoplasm of transverse colon   . Diverticulosis of large intestine without diverticulitis   . Rectal bleeding 05/29/2015  . Non-intractable cyclical vomiting with nausea     Past Surgical History:  Procedure Laterality Date  . ABDOMINAL HYSTERECTOMY    . COLONOSCOPY WITH PROPOFOL N/A 05/30/2015   Procedure: COLONOSCOPY WITH PROPOFOL;  Surgeon: Lucilla Lame, MD;  Location: [facility] ENDOSCOPY;  Service: Endoscopy;  Laterality: N/A;  . ESOPHAGOGASTRODUODENOSCOPY (EGD) WITH PROPOFOL N/A 05/30/2015   Procedure: ESOPHAGOGASTRODUODENOSCOPY (EGD) WITH PROPOFOL;  Surgeon: Lucilla Lame, MD;  Location: [facility] ENDOSCOPY;  Service: Endoscopy;  Laterality: N/A;  . Eye surgery for  glaucoma    . Left total knee replacement    . OOPHORECTOMY      Past Surgical History:  Procedure Laterality Date  . ABDOMINAL HYSTERECTOMY    . COLONOSCOPY WITH PROPOFOL N/A 05/30/2015   Procedure: COLONOSCOPY WITH PROPOFOL;  Surgeon: Lucilla Lame, MD;  Location: [facility] ENDOSCOPY;  Service: Endoscopy;  Laterality: N/A;  . ESOPHAGOGASTRODUODENOSCOPY (EGD) WITH PROPOFOL N/A 05/30/2015   Procedure: ESOPHAGOGASTRODUODENOSCOPY (EGD) WITH PROPOFOL;  Surgeon: Lucilla Lame, MD;  Location: [facility] ENDOSCOPY;  Service: Endoscopy;  Laterality: N/A;  . Eye surgery for glaucoma    . Left total knee replacement    . OOPHORECTOMY      Current Outpatient Rx  . Order #: [id number] Class: Historical Med  . Order #: [id number] Class: Historical Med  . Order #: [id number] Class: Historical Med  . Order #: [id number] Class: Historical Med  . Order #: [id number] Class: Historical Med  . Order #: [id number] Class: Historical Med  . Order #: [id number] Class: Historical Med  . Order #: [id number] Class: Normal  . Order #: [id number] Class: Historical Med  . Order #: [id number] Class: Historical Med  . Order #: [id number] Class: Historical Med  . Order #: [id number] Class: Print  . Order #: [id number] Class: Historical Med  . Order #: [id number] Class: Historical Med  . Order #: [id number] Class: Print    Allergies:  Tramadol  Family History: Family History  Problem Relation Age of Onset  . Diabetes Mother     Social History: Social History  Substance Use Topics  . Smoking status: Former Research scientist (life sciences)  . Smokeless tobacco: Not on  file  . Alcohol use No     Review of Systems:   10 point review of systems was performed and was otherwise negative:  Constitutional: No fever Eyes: No visual disturbances ENT: No sore throat, ear pain Cardiac: Mostly transient substernal related to meals. Respiratory: No shortness of breath, wheezing, or stridor Abdomen: No abdominal pain, no vomiting, No diarrhea Endocrine: No weight loss, No  night sweats Extremities: No peripheral edema, cyanosis Skin: No rashes, easy bruising Neurologic: No focal weakness, trouble with speech or swollowing Urologic: No dysuria, Hematuria, or urinary frequency * She denies any exertional chest pain Physical Exam:  ED Triage Vitals  Enc Vitals Group     BP 09/02/16 1114 133/66     Pulse Rate 09/02/16 1114 80     Resp 09/02/16 1114 18     Temp 09/02/16 1114 97.4 F (36.3 C)     Temp Source 09/02/16 1114 Oral     SpO2 09/02/16 1114 96 %     Weight 09/02/16 1109 210 lb (95.3 kg)     Height 09/02/16 1109 5\' 5"  (1.651 m)     Head Circumference --      Peak Flow --      Pain Score 09/02/16 1109 4     Pain Loc --      Pain Edu? --      Excl. in [place]? --     General: Awake , Alert , and Oriented times 3; GCS 15 Head: Normal cephalic , atraumatic Eyes: Pupils equal , round, reactive to light Nose/Throat: No nasal drainage, patent upper airway without erythema or exudate.  Neck: Supple, Full range of motion, No anterior adenopathy or palpable thyroid masses Lungs: Clear to ascultation without wheezes , rhonchi, or rales Heart: Regular rate, regular rhythm without murmurs , gallops , or rubs Abdomen: Soft, non tender without rebound, guarding , or rigidity; bowel sounds positive and symmetric in all 4 quadrants. No organomegaly .        Extremities: 2 plus symmetric pulses. No edema, clubbing or cyanosis Neurologic: normal ambulation, Motor symmetric without deficits, sensory intact Skin: warm, dry, no rashes   Labs:   All laboratory work was reviewed including any pertinent negatives or positives listed below:  Labs Reviewed  CBC - Abnormal; Notable for the following:       Result Value   RDW 14.9 (*)    All other components within normal limits  BASIC METABOLIC PANEL - Abnormal; Notable for the following:    Glucose, Bld 106 (*)    All other components within normal limits  BASIC METABOLIC PANEL  TROPONIN I  TROPONIN I   Laboratory work was reviewed and showed no clinically significant abnormalities.   EKG:  ED ECG REPORT I, Daymon Larsen, the attending physician, personally viewed and interpreted this ECG.  Date: 09/02/2016 EKG Time:1112 Rate: 79 Rhythm: normal sinus rhythm QRS Axis: normal Intervals: normal ST/T Wave abnormalities: normal Conduction Disturbances: none Narrative Interpretation: unremarkable No acute ischemic changes   Radiology:  "Dg Chest 2 View  Result Date: 09/02/2016 CLINICAL DATA:  Intermittent chest pain for 1 week. Former smoker. Some cough, some nausea and vomiting. History of COPD EXAM: CHEST  2 VIEW COMPARISON:  Chest x-ray dated 07/29/2014. FINDINGS: Heart size and mediastinal contours are normal. Atherosclerotic changes noted at the aortic arch. Lungs are hyperexpanded. Coarse interstitial lung markings noted bilaterally. No confluent opacity to suggest a developing pneumonia. No pleural effusion or pneumothorax seen. Mild degenerative spurring  noted within the thoracic spine. IMPRESSION: 1. Hyperexpanded lungs compatible with history of COPD. Suspect associated chronic bronchitic changes centrally and some degree of chronic interstitial lung disease. 2. No acute findings.  No evidence of pneumonia or pulmonary edema. 3. Aortic atherosclerosis. Electronically Signed   By: Franki Cabot M.D.   On: 09/02/2016 11:30  "  I personally reviewed the radiologic studies    ED Course: * Patient's stay here was uneventful and given her differential for chest pain or felt this was unlikely to be acute coronary syndrome, pulmonary embolism, aortic dissection, cardiac Denied or other life-threatening causes for chest pain. She received a GI cocktail here in emergency department which gave her symptomatic relief and with her history of reflux and pain around meals have felt this was most likely the source of her chest discomfort. I did request her to see her primary physician for  second opinion along with her cardiologist.     Assessment: * Esophageal reflux disease  Final Clinical Impression:   Final diagnoses:  Atypical chest pain     Plan:  Outpatient " Discharge Medication List as of 09/02/2016  5:39 PM    START taking these medications   Details  pantoprazole (PROTONIX) 20 MG tablet Take 1 tablet (20 mg total) by mouth daily., Starting Fri 09/02/2016, Until Sat 09/02/2017, Print    sucralfate (CARAFATE) 1 g tablet Take 1 tablet (1 g total) by mouth 4 (four) times daily., Starting Fri 09/02/2016, Until Sat 09/02/2017, Print      " Patient was advised to return immediately if condition worsens. Patient was advised to follow up with their primary care physician or other specialized physicians involved in their outpatient care. The patient and/or family member/power of attorney had laboratory results reviewed at the bedside. All questions and concerns were addressed and appropriate discharge instructions were distributed by the nursing staff.             Daymon Larsen, MD 09/02/16 [phone]

## 2016-09-02 NOTE — Discharge Instructions (Signed)
Please return immediately if condition worsens. Please contact her primary physician or the physician you were given for referral. If you have any specialist physicians involved in her treatment and plan please also contact them. Thank you for using [facility]. ° °

## 2016-09-02 NOTE — ED Triage Notes (Addendum)
Pt c/o left sided intermittent chest pain over the last week. Reports some vomiting and fatigue as well. NAD at this time. Respiration unlabored. Also has had some left arm pain.

## 2016-09-02 NOTE — ED Notes (Signed)
Brought back to triage area for one more green top at lab request.

## 2016-09-26 DIAGNOSIS — Z6835 Body mass index (BMI) 35.0-35.9, adult: Secondary | ICD-10-CM | POA: Diagnosis not present

## 2016-09-26 DIAGNOSIS — E1140 Type 2 diabetes mellitus with diabetic neuropathy, unspecified: Secondary | ICD-10-CM | POA: Diagnosis not present

## 2016-09-26 DIAGNOSIS — E785 Hyperlipidemia, unspecified: Secondary | ICD-10-CM | POA: Diagnosis not present

## 2016-09-26 DIAGNOSIS — I25119 Atherosclerotic heart disease of native coronary artery with unspecified angina pectoris: Secondary | ICD-10-CM | POA: Diagnosis not present

## 2016-09-26 DIAGNOSIS — M4800 Spinal stenosis, site unspecified: Secondary | ICD-10-CM | POA: Diagnosis not present

## 2016-09-26 DIAGNOSIS — E1169 Type 2 diabetes mellitus with other specified complication: Secondary | ICD-10-CM | POA: Diagnosis not present

## 2016-09-26 DIAGNOSIS — R6889 Other general symptoms and signs: Secondary | ICD-10-CM | POA: Diagnosis not present

## 2016-09-26 DIAGNOSIS — J42 Unspecified chronic bronchitis: Secondary | ICD-10-CM | POA: Diagnosis not present

## 2016-09-26 DIAGNOSIS — I700 Atherosclerosis of aorta: Secondary | ICD-10-CM | POA: Diagnosis not present

## 2016-10-14 DIAGNOSIS — I25119 Atherosclerotic heart disease of native coronary artery with unspecified angina pectoris: Secondary | ICD-10-CM | POA: Diagnosis not present

## 2016-10-14 DIAGNOSIS — E1140 Type 2 diabetes mellitus with diabetic neuropathy, unspecified: Secondary | ICD-10-CM | POA: Diagnosis not present

## 2016-10-18 DIAGNOSIS — M48062 Spinal stenosis, lumbar region with neurogenic claudication: Secondary | ICD-10-CM | POA: Diagnosis not present

## 2016-10-18 DIAGNOSIS — Z6833 Body mass index (BMI) 33.0-33.9, adult: Secondary | ICD-10-CM | POA: Diagnosis not present

## 2016-10-18 DIAGNOSIS — M4316 Spondylolisthesis, lumbar region: Secondary | ICD-10-CM | POA: Diagnosis not present

## 2016-10-18 DIAGNOSIS — R6889 Other general symptoms and signs: Secondary | ICD-10-CM | POA: Diagnosis not present

## 2016-10-27 ENCOUNTER — Other Ambulatory Visit: Payer: Self-pay | Admitting: Neurosurgery

## 2016-10-27 DIAGNOSIS — M48062 Spinal stenosis, lumbar region with neurogenic claudication: Secondary | ICD-10-CM

## 2016-11-03 ENCOUNTER — Ambulatory Visit
Admission: RE | Admit: 2016-11-03 | Discharge: 2016-11-03 | Disposition: A | Discharge status: Home / Self Care | Payer: Medicare HMO | Source: Ambulatory Visit | Attending: Neurosurgery | Admitting: Neurosurgery

## 2016-11-03 DIAGNOSIS — M48062 Spinal stenosis, lumbar region with neurogenic claudication: Secondary | ICD-10-CM

## 2016-11-03 DIAGNOSIS — M48061 Spinal stenosis, lumbar region without neurogenic claudication: Secondary | ICD-10-CM | POA: Diagnosis not present

## 2016-11-03 DIAGNOSIS — R6889 Other general symptoms and signs: Secondary | ICD-10-CM | POA: Diagnosis not present

## 2016-11-03 MED ORDER — ONDANSETRON HCL 4 MG/2ML IJ SOLN
4.0000 mg | Freq: Once | INTRAMUSCULAR | Status: AC
  Administered 2016-11-03: 4 mg via INTRAMUSCULAR

## 2016-11-03 MED ORDER — IOPAMIDOL (ISOVUE-M 200) INJECTION 41%
15.0000 mL | Freq: Once | INTRAMUSCULAR | Status: AC
  Administered 2016-11-03: 15 mL via INTRATHECAL

## 2016-11-03 MED ORDER — DIAZEPAM 5 MG PO TABS
5.0000 mg | ORAL_TABLET | Freq: Once | ORAL | Status: AC
  Administered 2016-11-03: 5 mg via ORAL

## 2016-11-03 MED ORDER — MEPERIDINE HCL 100 MG/ML IJ SOLN
50.0000 mg | Freq: Once | INTRAMUSCULAR | Status: AC
  Administered 2016-11-03: 50 mg via INTRAMUSCULAR

## 2016-11-03 NOTE — Discharge Instructions (Signed)
Myelogram Discharge Instructions  1. Go home and rest quietly for the next 24 hours.  It is important to lie flat for the next 24 hours.  Get up only to go to the restroom.  You may lie in the bed or on a couch on your back, your stomach, your left side or your right side.  You may have one pillow under your head.  You may have pillows between your knees while you are on your side or under your knees while you are on your back.  2. DO NOT drive today.  Recline the seat as far back as it will go, while still wearing your seat belt, on the way home.  3. You may get up to go to the bathroom as needed.  You may sit up for 10 minutes to eat.  You may resume your normal diet and medications unless otherwise indicated.  Drink lots of extra fluids today and tomorrow.  4. The incidence of headache, nausea, or vomiting is about 5% (one in 20 patients).  If you develop a headache, lie flat and drink plenty of fluids until the headache goes away.  Caffeinated beverages may be helpful.  If you develop severe nausea and vomiting or a headache that does not go away with flat bed rest, call [phone].  5. You may resume normal activities after your 24 hours of bed rest is over; however, do not exert yourself strongly or do any heavy lifting tomorrow. If when you get up you have a headache when standing, go back to bed and force fluids for another 24 hours.  6. Call your physician for a follow-up appointment.  The results of your myelogram will be sent directly to your physician by the following day.  7. If you have any questions or if complications develop after you arrive home, please call [phone].  Discharge instructions have been explained to the patient.  The patient, or the person responsible for the patient, fully understands these instructions.       May resume Venalefaxine on November 04, 2016, after 9:30 am.

## 2016-11-04 ENCOUNTER — Telehealth: Payer: Self-pay

## 2016-11-04 NOTE — Telephone Encounter (Signed)
Penny Hart called to report her stomach was bloated and hurting her and wanted to know if these symptoms were caused by yesterday's myelogram.  I told her "no," but said lying flat for the past 24 hours, especially after eating quickly and not being able to move around to help food digest, may be contributing.  I asked her to hang in there for about one more hour for her 24 hours of bedrest to be over, then she may ambulate and try to get things moving in her gut.  She says she will do this and also will ask someone to bring her some Zantac or Tums.  She also says she hasn't been taking either Protonix or Nexium lately, so I suggested she resume one of these to help her belly in the long term.  She states an understanding to call her primary doctor if these symptoms continue.  Brita Romp, RN

## 2017-02-28 DIAGNOSIS — M4316 Spondylolisthesis, lumbar region: Secondary | ICD-10-CM | POA: Diagnosis not present

## 2017-02-28 DIAGNOSIS — M48062 Spinal stenosis, lumbar region with neurogenic claudication: Secondary | ICD-10-CM | POA: Diagnosis not present

## 2017-02-28 DIAGNOSIS — R6889 Other general symptoms and signs: Secondary | ICD-10-CM | POA: Diagnosis not present

## 2017-03-23 DIAGNOSIS — I25119 Atherosclerotic heart disease of native coronary artery with unspecified angina pectoris: Secondary | ICD-10-CM | POA: Diagnosis not present

## 2017-03-23 DIAGNOSIS — E1140 Type 2 diabetes mellitus with diabetic neuropathy, unspecified: Secondary | ICD-10-CM | POA: Diagnosis not present

## 2017-03-30 DIAGNOSIS — R6889 Other general symptoms and signs: Secondary | ICD-10-CM | POA: Diagnosis not present

## 2017-03-30 DIAGNOSIS — E1140 Type 2 diabetes mellitus with diabetic neuropathy, unspecified: Secondary | ICD-10-CM | POA: Diagnosis not present

## 2017-03-30 DIAGNOSIS — I25119 Atherosclerotic heart disease of native coronary artery with unspecified angina pectoris: Secondary | ICD-10-CM | POA: Diagnosis not present

## 2017-03-30 DIAGNOSIS — I700 Atherosclerosis of aorta: Secondary | ICD-10-CM | POA: Diagnosis not present

## 2017-03-30 DIAGNOSIS — E1169 Type 2 diabetes mellitus with other specified complication: Secondary | ICD-10-CM | POA: Diagnosis not present

## 2017-03-30 DIAGNOSIS — Z0000 Encounter for general adult medical examination without abnormal findings: Secondary | ICD-10-CM | POA: Diagnosis not present

## 2017-03-30 DIAGNOSIS — J42 Unspecified chronic bronchitis: Secondary | ICD-10-CM | POA: Diagnosis not present

## 2017-03-30 DIAGNOSIS — G6289 Other specified polyneuropathies: Secondary | ICD-10-CM | POA: Diagnosis not present

## 2017-03-30 DIAGNOSIS — E785 Hyperlipidemia, unspecified: Secondary | ICD-10-CM | POA: Diagnosis not present

## 2017-03-31 ENCOUNTER — Other Ambulatory Visit: Payer: Self-pay | Admitting: Neurosurgery

## 2017-04-19 NOTE — Pre-Procedure Instructions (Signed)
Penny Hart  04/19/2017      [facility] [name], [place] - [address] [place] [address] Phone: [phone] Fax: [facility] [place] [address] ([address]), [place] - [place]) [name] [address] Phone: [phone] Fax: [phone]    Your procedure is scheduled on Thursday, April 27, 2017  Report to [facility] Admitting Entrance "A" at 5:30 A.M.   Call this number if you have problems the morning of surgery:  [phone]   Remember:  Do not eat food or drink liquids after midnight.  Take these medicines the morning of surgery with A SIP OF WATER: AmLODipine (NORVASC), Gabapentin (NEURONTIN), Metoprolol tartrate (LOPRESSOR).  If needed Omeprazole (PRILOSEC) for acid reflux.  7 days before surgery stop taking all Aspirins, Vitamins, Fish oils, and Herbal medications. Also stop all NSAIDS i.e. Advil, Motrin, Aleve, Anaprox, Naproxen, BC and Goody Powders.  How to Manage Your Diabetes Before and After Surgery  Why is it important to control my blood sugar before and after surgery? . Improving blood sugar levels before and after surgery helps healing and can limit problems. . A way of improving blood sugar control is eating a healthy diet by: o  Eating less sugar and carbohydrates o  Increasing activity/exercise o  Talking with your doctor about reaching your blood sugar goals . High blood sugars (greater than 180 mg/dL) can raise your risk of infections and slow your recovery, so you will need to focus on controlling your diabetes during the weeks before surgery. . Make sure that the doctor who takes care of your diabetes knows about your planned surgery including the date and location.  How do I manage my blood sugar before surgery? . Check your blood sugar at least 4 times a day, starting 2 days before surgery, to make sure that the level is not too high or  low. o Check your blood sugar the morning of your surgery when you wake up and every 2 hours until you get to the Short Stay unit. . If your blood sugar is less than 70 mg/dL, you will need to treat for low blood sugar: o Do not take insulin. o Treat a low blood sugar (less than 70 mg/dL) with  cup of clear juice (cranberry or apple), 4 glucose tablets, OR glucose gel. o Recheck blood sugar in 15 minutes after treatment (to make sure it is greater than 70 mg/dL). If your blood sugar is not greater than 70 mg/dL on recheck, call [phone] for further instructions. . Report your blood sugar to the short stay nurse when you get to Short Stay.  . If you are admitted to the hospital after surgery: o Your blood sugar will be checked by the staff and you will probably be given insulin after surgery (instead of oral diabetes medicines) to make sure you have good blood sugar levels. o The goal for blood sugar control after surgery is 80-180 mg/dL.  WHAT DO I DO ABOUT MY DIABETES MEDICATION?   Marland Kitchen Do not take oral diabetes medicines (pills) the morning of surgery.  . THE NIGHT BEFORE SURGERY, take ___________ units of ___________insulin.       Marland Kitchen HE MORNING OF SURGERY, take _____________ units of __________insulin.  . The day of surgery, do not take other diabetes injectables, including Byetta (exenatide), Bydureon (exenatide ER), Victoza (liraglutide), or Trulicity (dulaglutide).  . If your CBG is greater than  220 mg/dL, you may take  of your sliding scale (correction) dose of insulin.   Do not wear jewelry, make-up or nail polish.  Do not wear lotions, powders, or perfumes, or deodorant.  Do not shave 48 hours prior to surgery.    Do not bring valuables to the hospital.  [facility] is not responsible for any belongings or valuables.  Contacts, dentures or bridgework may not be worn into surgery.  Leave your suitcase in the car.  After surgery it may be brought to your room.  For patients  admitted to the hospital, discharge time will be determined by your treatment team.  Patients discharged the day of surgery will not be allowed to drive home.   Special instructions:   [place]- Preparing For Surgery  Before surgery, you can play an important role. Because skin is not sterile, your skin needs to be as free of germs as possible. You can reduce the number of germs on your skin by washing with CHG (chlorahexidine gluconate) Soap before surgery.  CHG is an antiseptic cleaner which kills germs and bonds with the skin to continue killing germs even after washing.  Please do not use if you have an allergy to CHG or antibacterial soaps. If your skin becomes reddened/irritated stop using the CHG.  Do not shave (including legs and underarms) for at least 48 hours prior to first CHG shower. It is OK to shave your face.  Please follow these instructions carefully.   1. Shower the NIGHT BEFORE SURGERY and the MORNING OF SURGERY with CHG.   2. If you chose to wash your hair, wash your hair first as usual with your normal shampoo.  3. After you shampoo, rinse your hair and body thoroughly to remove the shampoo.  4. Use CHG as you would any other liquid soap. You can apply CHG directly to the skin and wash gently with a scrungie or a clean washcloth.   5. Apply the CHG Soap to your body ONLY FROM THE NECK DOWN.  Do not use on open wounds or open sores. Avoid contact with your eyes, ears, mouth and genitals (private parts). Wash genitals (private parts) with your normal soap.  6. Wash thoroughly, paying special attention to the area where your surgery will be performed.  7. Thoroughly rinse your body with warm water from the neck down.  8. DO NOT shower/wash with your normal soap after using and rinsing off the CHG Soap.  9. Pat yourself dry with a CLEAN TOWEL.   10. Wear CLEAN PAJAMAS   11. Place CLEAN SHEETS on your bed the night of your first shower and DO NOT SLEEP WITH  PETS.  Day of Surgery: Do not apply any deodorants/lotions. Please wear clean clothes to the hospital/surgery center.    Please read over the following fact sheets that you were given. Pain Booklet, Coughing and Deep Breathing, Blood Transfusion Information, MRSA Information and Surgical Site Infection Prevention

## 2017-04-20 ENCOUNTER — Encounter (HOSPITAL_COMMUNITY): Payer: Self-pay

## 2017-04-20 ENCOUNTER — Encounter (HOSPITAL_COMMUNITY)
Admission: RE | Admit: 2017-04-20 | Discharge: 2017-04-20 | Disposition: A | Discharge status: Home / Self Care | Payer: Medicare HMO | Source: Ambulatory Visit | Attending: Neurosurgery | Admitting: Neurosurgery

## 2017-04-20 DIAGNOSIS — K219 Gastro-esophageal reflux disease without esophagitis: Secondary | ICD-10-CM | POA: Insufficient documentation

## 2017-04-20 DIAGNOSIS — E119 Type 2 diabetes mellitus without complications: Secondary | ICD-10-CM | POA: Insufficient documentation

## 2017-04-20 DIAGNOSIS — I509 Heart failure, unspecified: Secondary | ICD-10-CM | POA: Diagnosis not present

## 2017-04-20 DIAGNOSIS — K449 Diaphragmatic hernia without obstruction or gangrene: Secondary | ICD-10-CM | POA: Diagnosis not present

## 2017-04-20 DIAGNOSIS — D120 Benign neoplasm of cecum: Secondary | ICD-10-CM | POA: Insufficient documentation

## 2017-04-20 DIAGNOSIS — I110 Hypertensive heart disease with heart failure: Secondary | ICD-10-CM | POA: Diagnosis not present

## 2017-04-20 DIAGNOSIS — Z01812 Encounter for preprocedural laboratory examination: Secondary | ICD-10-CM | POA: Insufficient documentation

## 2017-04-20 DIAGNOSIS — R6889 Other general symptoms and signs: Secondary | ICD-10-CM | POA: Diagnosis not present

## 2017-04-20 DIAGNOSIS — D123 Benign neoplasm of transverse colon: Secondary | ICD-10-CM | POA: Insufficient documentation

## 2017-04-20 DIAGNOSIS — E785 Hyperlipidemia, unspecified: Secondary | ICD-10-CM | POA: Diagnosis not present

## 2017-04-20 DIAGNOSIS — K5730 Diverticulosis of large intestine without perforation or abscess without bleeding: Secondary | ICD-10-CM | POA: Diagnosis not present

## 2017-04-20 DIAGNOSIS — H409 Unspecified glaucoma: Secondary | ICD-10-CM | POA: Diagnosis not present

## 2017-04-20 DIAGNOSIS — K625 Hemorrhage of anus and rectum: Secondary | ICD-10-CM | POA: Insufficient documentation

## 2017-04-20 DIAGNOSIS — J449 Chronic obstructive pulmonary disease, unspecified: Secondary | ICD-10-CM | POA: Insufficient documentation

## 2017-04-20 HISTORY — DX: Spondylolisthesis, lumbar region: M43.16

## 2017-04-20 HISTORY — DX: Unspecified osteoarthritis, unspecified site: M19.90

## 2017-04-20 HISTORY — DX: Heart failure, unspecified: I50.9

## 2017-04-20 HISTORY — DX: Sleep apnea, unspecified: G47.30

## 2017-04-20 LAB — CBC
HCT: 42.4 % (ref 36.0–46.0)
Hemoglobin: 13.2 g/dL (ref 12.0–15.0)
MCH: 27.3 pg (ref 26.0–34.0)
MCHC: 31.1 g/dL (ref 30.0–36.0)
MCV: 87.6 fL (ref 78.0–100.0)
Platelets: 260 10*3/uL (ref 150–400)
RBC: 4.84 MIL/uL (ref 3.87–5.11)
RDW: 14.9 % (ref 11.5–15.5)
WBC: 5.5 10*3/uL (ref 4.0–10.5)

## 2017-04-20 LAB — SURGICAL PCR SCREEN
MRSA, PCR: NEGATIVE
Staphylococcus aureus: NEGATIVE

## 2017-04-20 LAB — TYPE AND SCREEN
ABO/RH(D): O POS
Antibody Screen: NEGATIVE

## 2017-04-20 LAB — BASIC METABOLIC PANEL
Anion gap: 10 (ref 5–15)
BUN: 7 mg/dL (ref 6–20)
CO2: 23 mmol/L (ref 22–32)
Calcium: 9.2 mg/dL (ref 8.9–10.3)
Chloride: 104 mmol/L (ref 101–111)
Creatinine, Ser: 0.79 mg/dL (ref 0.44–1.00)
GFR calc Af Amer: >60 mL/min (ref >60)
GFR calc non Af Amer: >60 mL/min (ref >60)
Glucose, Bld: 96 mg/dL (ref 65–99)
Potassium: 3.8 mmol/L (ref 3.5–5.1)
Sodium: 137 mmol/L (ref 135–145)

## 2017-04-20 LAB — ABO/RH: ABO/RH(D): O POS

## 2017-04-20 LAB — GLUCOSE, CAPILLARY: Glucose-Capillary: 102 mg/dL — ABNORMAL HIGH (ref 65–99)

## 2017-04-20 NOTE — Progress Notes (Addendum)
[facility]  [facility]  Chest x-ray - 09/02/16 (E)  EKG - 09/02/16 (E)  Stress Test - 03/14/14 (CE)  ECHO - 03/14/14 (CE)  Cardiac Cath - Denies  Sleep Study - Yes CPAP - Yes- Does not know settings and only uses as needed  HA1C- 03/23/17-6.2 Fasting Blood Sugar - Today, 102 Checks Blood Sugar ___0__ times a day Pt does not check her blood sugar. Pt sts its always normal, and she has never used medications to control it.  Pt denies having chest pain, sob, or fever at this time. All instructions explained to the pt, with a verbal understanding of the material. Pt agrees to go over the instructions while at home for a better understanding. The opportunity to ask questions was provided.

## 2017-04-20 NOTE — Progress Notes (Signed)
Penny Hart            04/19/2017                          [place], [place] - [address] [place] [address] Phone: [phone] Fax: [place] [address]), [place] - [place]) [place] [address] Phone: [phone] Fax: [phone]              Your procedure is scheduled on Thursday, April 27, 2017            Report to [facility] Admitting Entrance "A" at 5:30 A.M.             Call this number if you have problems the morning of surgery:            [phone]             Remember:            Do not eat food or drink liquids after midnight.            Take these medicines the morning of surgery with A SIP OF WATER: AmLODipine (NORVASC), Gabapentin (NEURONTIN), Metoprolol tartrate (LOPRESSOR).  If needed Omeprazole (PRILOSEC) for acid reflux.  7 days before surgery stop taking all Aspirins, Vitamins, Fish oils, and Herbal medications. Also stop all NSAIDS i.e. Advil, Motrin, Aleve, Anaprox, Naproxen, BC and Goody Powders.  HOW TO MANAGE YOUR DIABETES BEFORE AND AFTER SURGERY  Why is it important to control my blood sugar before and after surgery?  Improving blood sugar levels before and after surgery helps healing and can limit problems.  A way of improving blood sugar control is eating a healthy diet by: ?  Eating less sugar and carbohydrates ?  Increasing activity/exercise ?  Talking with your doctor about reaching your blood sugar goals  High blood sugars (greater than 180 mg/dL) can raise your risk of infections and slow your recovery, so you will need to focus on controlling your diabetes during the weeks before surgery.  Make sure that the doctor who takes care of your diabetes knows about your planned surgery including the date and location.  How do I manage my blood sugar before surgery?  Check your blood sugar at  least 4 times a day, starting 2 days before surgery, to make sure that the level is not too high or low. ? Check your blood sugar the morning of your surgery when you wake up and every 2 hours until you get to the Short Stay unit.  If your blood sugar is less than 70 mg/dL, you will need to treat for low blood sugar: ? Do not take insulin. ? Treat a low blood sugar (less than 70 mg/dL) with  cup of clear juice (cranberry or apple), 4glucose tablets, OR glucose gel. ? Recheck blood sugar in 15 minutes after treatment (to make sure it is greater than 70 mg/dL). If your blood sugar is not greater than 70 mg/dL on recheck, call [phone] for further instructions.  Report your blood sugar to the short stay nurse when you get to Short Stay.   If you are admitted to the hospital after surgery: ? Your blood sugar will be checked by the staff and you will probably be given insulin after surgery (instead of oral diabetes medicines) to make sure you have  good blood sugar levels. ? The goal for blood sugar control after surgery is 80-180 mg/dL.             Do not wear jewelry, make-up or nail polish.            Do not wear lotions, powders, or perfumes, or deodorant.            Do not shave 48 hours prior to surgery.              Do not bring valuables to the hospital.            [facility] is not responsible for any belongings or valuables.  Contacts, dentures or bridgework may not be worn into surgery.  Leave your suitcase in the car.  After surgery it may be brought to your room.  For patients admitted to the hospital, discharge time will be determined by your treatment team.  Patients discharged the day of surgery will not be allowed to drive home.   Special instructions:   [facility]- Preparing For Surgery  Before surgery, you can play an important role. Because skin is not sterile, your skin needs to be as free of germs as possible. You can reduce the number of germs on your  skin by washing with CHG (chlorahexidine gluconate) Soap before surgery.  CHG is an antiseptic cleaner which kills germs and bonds with the skin to continue killing germs even after washing.  Please do not use if you have an allergy to CHG or antibacterial soaps. If your skin becomes reddened/irritated stop using the CHG.  Do not shave (including legs and underarms) for at least 48 hours prior to first CHG shower. It is OK to shave your face.  Please follow these instructions carefully.                                                                                                                     1. Shower the NIGHT BEFORE SURGERY and the MORNING OF SURGERY with CHG.   2. If you chose to wash your hair, wash your hair first as usual with your normal shampoo.  3. After you shampoo, rinse your hair and body thoroughly to remove the shampoo.  4. Use CHG as you would any other liquid soap. You can apply CHG directly to the skin and wash gently with a scrungie or a clean washcloth.   5. Apply the CHG Soap to your body ONLY FROM THE NECK DOWN.  Do not use on open wounds or open sores. Avoid contact with your eyes, ears, mouth and genitals (private parts). Wash genitals (private parts) with your normal soap.  6. Wash thoroughly, paying special attention to the area where your surgery will be performed.  7. Thoroughly rinse your body with warm water from the neck down.  8. DO NOT shower/wash with your normal soap after using and rinsing off the CHG Soap.  9. Pat yourself dry with a CLEAN TOWEL.  10. Wear CLEAN PAJAMAS   11. Place CLEAN SHEETS on your bed the night of your first shower and DO NOT SLEEP WITH PETS.  Day of Surgery: Do not apply any deodorants/lotions. Please wear clean clothes to the hospital/surgery center.    Please read over the following fact sheets that you were given. Pain Booklet, Coughing and Deep Breathing, Blood Transfusion Information, MRSA  Information and Surgical Site Infection Prevention

## 2017-04-27 ENCOUNTER — Inpatient Hospital Stay (HOSPITAL_COMMUNITY): Payer: Medicare HMO

## 2017-04-27 ENCOUNTER — Encounter (HOSPITAL_COMMUNITY): Payer: Self-pay | Admitting: *Deleted

## 2017-04-27 ENCOUNTER — Inpatient Hospital Stay (HOSPITAL_COMMUNITY)
Admission: RE | Admit: 2017-04-27 | Discharge: 2017-05-03 | DRG: 454 | Disposition: A | Discharge status: Skilled Nursing Facility | Payer: Medicare HMO | Source: Ambulatory Visit | Attending: Neurosurgery | Admitting: Neurosurgery

## 2017-04-27 ENCOUNTER — Inpatient Hospital Stay (HOSPITAL_COMMUNITY): Payer: Medicare HMO | Admitting: Certified Registered Nurse Anesthetist

## 2017-04-27 ENCOUNTER — Encounter (HOSPITAL_COMMUNITY)
Admission: RE | Disposition: A | Discharge status: Skilled Nursing Facility | Payer: Self-pay | Source: Ambulatory Visit | Attending: Neurosurgery

## 2017-04-27 DIAGNOSIS — I25119 Atherosclerotic heart disease of native coronary artery with unspecified angina pectoris: Secondary | ICD-10-CM | POA: Diagnosis not present

## 2017-04-27 DIAGNOSIS — O3430 Maternal care for cervical incompetence, unspecified trimester: Secondary | ICD-10-CM | POA: Diagnosis not present

## 2017-04-27 DIAGNOSIS — I5032 Chronic diastolic (congestive) heart failure: Secondary | ICD-10-CM | POA: Diagnosis present

## 2017-04-27 DIAGNOSIS — I10 Essential (primary) hypertension: Secondary | ICD-10-CM

## 2017-04-27 DIAGNOSIS — Z23 Encounter for immunization: Secondary | ICD-10-CM

## 2017-04-27 DIAGNOSIS — J449 Chronic obstructive pulmonary disease, unspecified: Secondary | ICD-10-CM

## 2017-04-27 DIAGNOSIS — Z419 Encounter for procedure for purposes other than remedying health state, unspecified: Secondary | ICD-10-CM | POA: Diagnosis not present

## 2017-04-27 DIAGNOSIS — Z79899 Other long term (current) drug therapy: Secondary | ICD-10-CM | POA: Diagnosis not present

## 2017-04-27 DIAGNOSIS — K625 Hemorrhage of anus and rectum: Secondary | ICD-10-CM | POA: Diagnosis not present

## 2017-04-27 DIAGNOSIS — G4730 Sleep apnea, unspecified: Secondary | ICD-10-CM | POA: Diagnosis present

## 2017-04-27 DIAGNOSIS — K219 Gastro-esophageal reflux disease without esophagitis: Secondary | ICD-10-CM | POA: Diagnosis present

## 2017-04-27 DIAGNOSIS — G8918 Other acute postprocedural pain: Secondary | ICD-10-CM

## 2017-04-27 DIAGNOSIS — M4326 Fusion of spine, lumbar region: Secondary | ICD-10-CM | POA: Diagnosis not present

## 2017-04-27 DIAGNOSIS — E1140 Type 2 diabetes mellitus with diabetic neuropathy, unspecified: Secondary | ICD-10-CM | POA: Diagnosis present

## 2017-04-27 DIAGNOSIS — M48062 Spinal stenosis, lumbar region with neurogenic claudication: Secondary | ICD-10-CM | POA: Diagnosis not present

## 2017-04-27 DIAGNOSIS — R41841 Cognitive communication deficit: Secondary | ICD-10-CM | POA: Diagnosis not present

## 2017-04-27 DIAGNOSIS — M5416 Radiculopathy, lumbar region: Secondary | ICD-10-CM | POA: Diagnosis not present

## 2017-04-27 DIAGNOSIS — E119 Type 2 diabetes mellitus without complications: Secondary | ICD-10-CM

## 2017-04-27 DIAGNOSIS — Z96652 Presence of left artificial knee joint: Secondary | ICD-10-CM | POA: Diagnosis present

## 2017-04-27 DIAGNOSIS — E785 Hyperlipidemia, unspecified: Secondary | ICD-10-CM | POA: Diagnosis present

## 2017-04-27 DIAGNOSIS — M6281 Muscle weakness (generalized): Secondary | ICD-10-CM | POA: Diagnosis not present

## 2017-04-27 DIAGNOSIS — K449 Diaphragmatic hernia without obstruction or gangrene: Secondary | ICD-10-CM | POA: Diagnosis not present

## 2017-04-27 DIAGNOSIS — M4807 Spinal stenosis, lumbosacral region: Secondary | ICD-10-CM | POA: Diagnosis not present

## 2017-04-27 DIAGNOSIS — M4316 Spondylolisthesis, lumbar region: Secondary | ICD-10-CM | POA: Diagnosis not present

## 2017-04-27 DIAGNOSIS — D72829 Elevated white blood cell count, unspecified: Secondary | ICD-10-CM | POA: Diagnosis not present

## 2017-04-27 DIAGNOSIS — M545 Low back pain: Secondary | ICD-10-CM | POA: Diagnosis present

## 2017-04-27 DIAGNOSIS — K921 Melena: Secondary | ICD-10-CM | POA: Diagnosis not present

## 2017-04-27 DIAGNOSIS — I110 Hypertensive heart disease with heart failure: Secondary | ICD-10-CM | POA: Diagnosis not present

## 2017-04-27 DIAGNOSIS — Z87891 Personal history of nicotine dependence: Secondary | ICD-10-CM | POA: Diagnosis not present

## 2017-04-27 DIAGNOSIS — R2689 Other abnormalities of gait and mobility: Secondary | ICD-10-CM | POA: Diagnosis not present

## 2017-04-27 DIAGNOSIS — M549 Dorsalgia, unspecified: Secondary | ICD-10-CM | POA: Diagnosis not present

## 2017-04-27 DIAGNOSIS — E871 Hypo-osmolality and hyponatremia: Secondary | ICD-10-CM

## 2017-04-27 DIAGNOSIS — M48061 Spinal stenosis, lumbar region without neurogenic claudication: Secondary | ICD-10-CM | POA: Diagnosis not present

## 2017-04-27 DIAGNOSIS — Z4789 Encounter for other orthopedic aftercare: Secondary | ICD-10-CM | POA: Diagnosis not present

## 2017-04-27 HISTORY — PX: POSTERIOR LUMBAR FUSION: SHX6036

## 2017-04-27 LAB — GLUCOSE, CAPILLARY
Glucose-Capillary: 106 mg/dL — ABNORMAL HIGH (ref 65–99)
Glucose-Capillary: 136 mg/dL — ABNORMAL HIGH (ref 65–99)

## 2017-04-27 SURGERY — POSTERIOR LUMBAR FUSION 1 LEVEL
Anesthesia: General | Site: Back

## 2017-04-27 MED ORDER — HYDROMORPHONE HCL 2 MG PO TABS
ORAL_TABLET | ORAL | Status: AC
  Filled 2017-04-27: qty 2

## 2017-04-27 MED ORDER — HYDROMORPHONE HCL 1 MG/ML IJ SOLN
0.2500 mg | INTRAMUSCULAR | Status: DC | PRN
  Administered 2017-04-27 (×2): 0.25 mg via INTRAVENOUS

## 2017-04-27 MED ORDER — ONDANSETRON HCL 4 MG PO TABS: 4.0000 mg | ORAL_TABLET | Freq: Four times a day (QID) | ORAL | Status: DC | PRN

## 2017-04-27 MED ORDER — FUROSEMIDE 20 MG PO TABS
20.0000 mg | ORAL_TABLET | Freq: Every day | ORAL | Status: DC
  Administered 2017-04-28 – 2017-05-03 (×5): 20 mg via ORAL
  Filled 2017-04-27 (×5): qty 1

## 2017-04-27 MED ORDER — ARTIFICIAL TEARS OPHTHALMIC OINT
TOPICAL_OINTMENT | OPHTHALMIC | Status: AC
  Filled 2017-04-27: qty 3.5

## 2017-04-27 MED ORDER — FENTANYL CITRATE (PF) 100 MCG/2ML IJ SOLN
INTRAMUSCULAR | Status: DC | PRN
Start: 2017-04-27 — End: 2017-04-27
  Administered 2017-04-27 (×3): 50 ug via INTRAVENOUS

## 2017-04-27 MED ORDER — VANCOMYCIN HCL 1000 MG IV SOLR
INTRAVENOUS | Status: DC | PRN
  Administered 2017-04-27: 1000 mg via TOPICAL

## 2017-04-27 MED ORDER — LIDOCAINE HCL (CARDIAC) 20 MG/ML IV SOLN
INTRAVENOUS | Status: DC | PRN
  Administered 2017-04-27: 60 mg via INTRAVENOUS

## 2017-04-27 MED ORDER — SODIUM CHLORIDE 0.9 % IV SOLN
250.0000 mL | INTRAVENOUS | Status: DC
  Administered 2017-04-27: 250 mL via INTRAVENOUS

## 2017-04-27 MED ORDER — BUPIVACAINE-EPINEPHRINE (PF) 0.5% -1:200000 IJ SOLN
INTRAMUSCULAR | Status: DC | PRN
  Administered 2017-04-27: 10 mL

## 2017-04-27 MED ORDER — METOPROLOL TARTRATE 12.5 MG HALF TABLET
12.5000 mg | ORAL_TABLET | Freq: Two times a day (BID) | ORAL | Status: DC
  Administered 2017-04-28 – 2017-05-03 (×10): 12.5 mg via ORAL
  Filled 2017-04-27 (×12): qty 1

## 2017-04-27 MED ORDER — ONDANSETRON HCL 4 MG/2ML IJ SOLN
INTRAMUSCULAR | Status: AC
  Filled 2017-04-27: qty 2

## 2017-04-27 MED ORDER — THROMBIN 20000 UNITS EX SOLR
CUTANEOUS | Status: AC
  Filled 2017-04-27: qty 20000

## 2017-04-27 MED ORDER — INFLUENZA VAC SPLIT HIGH-DOSE 0.5 ML IM SUSY
0.5000 mL | PREFILLED_SYRINGE | INTRAMUSCULAR | Status: AC
  Administered 2017-04-29: 0.5 mL via INTRAMUSCULAR
  Filled 2017-04-27 (×2): qty 0.5

## 2017-04-27 MED ORDER — ACETAMINOPHEN 650 MG RE SUPP: 650.0000 mg | RECTAL | Status: DC | PRN

## 2017-04-27 MED ORDER — SURGIFOAM 100 EX MISC
CUTANEOUS | Status: DC | PRN
  Administered 2017-04-27: 09:00:00 via TOPICAL

## 2017-04-27 MED ORDER — THROMBIN 5000 UNITS EX SOLR
CUTANEOUS | Status: AC
  Filled 2017-04-27: qty 5000

## 2017-04-27 MED ORDER — ROCURONIUM BROMIDE 10 MG/ML (PF) SYRINGE
PREFILLED_SYRINGE | INTRAVENOUS | Status: AC
  Filled 2017-04-27: qty 5

## 2017-04-27 MED ORDER — DOCUSATE SODIUM 100 MG PO CAPS
100.0000 mg | ORAL_CAPSULE | Freq: Two times a day (BID) | ORAL | Status: DC
  Administered 2017-04-27 – 2017-05-03 (×11): 100 mg via ORAL
  Filled 2017-04-27 (×11): qty 1

## 2017-04-27 MED ORDER — BUPIVACAINE LIPOSOME 1.3 % IJ SUSP
20.0000 mL | INTRAMUSCULAR | Status: AC
  Administered 2017-04-27: 20 mL
  Filled 2017-04-27: qty 20

## 2017-04-27 MED ORDER — MIDAZOLAM HCL 2 MG/2ML IJ SOLN
INTRAMUSCULAR | Status: AC
  Filled 2017-04-27: qty 2

## 2017-04-27 MED ORDER — BISACODYL 10 MG RE SUPP: 10.0000 mg | Freq: Every day | RECTAL | Status: DC | PRN

## 2017-04-27 MED ORDER — HYDROMORPHONE HCL 2 MG PO TABS
4.0000 mg | ORAL_TABLET | ORAL | Status: DC | PRN
  Administered 2017-04-27 – 2017-04-29 (×5): 4 mg via ORAL
  Filled 2017-04-27 (×4): qty 2

## 2017-04-27 MED ORDER — CHLORHEXIDINE GLUCONATE CLOTH 2 % EX PADS: 6.0000 | MEDICATED_PAD | Freq: Once | CUTANEOUS | Status: DC

## 2017-04-27 MED ORDER — ONDANSETRON HCL 4 MG/2ML IJ SOLN
INTRAMUSCULAR | Status: DC | PRN
  Administered 2017-04-27: 4 mg via INTRAVENOUS

## 2017-04-27 MED ORDER — MIDAZOLAM HCL 5 MG/5ML IJ SOLN
INTRAMUSCULAR | Status: DC | PRN
  Administered 2017-04-27: 1 mg via INTRAVENOUS

## 2017-04-27 MED ORDER — VANCOMYCIN HCL 1000 MG IV SOLR
INTRAVENOUS | Status: AC
  Filled 2017-04-27: qty 1000

## 2017-04-27 MED ORDER — ZOLPIDEM TARTRATE 5 MG PO TABS
5.0000 mg | ORAL_TABLET | Freq: Every evening | ORAL | Status: DC | PRN
  Filled 2017-04-27: qty 1

## 2017-04-27 MED ORDER — PANTOPRAZOLE SODIUM 40 MG PO TBEC
40.0000 mg | DELAYED_RELEASE_TABLET | Freq: Every day | ORAL | Status: DC
  Administered 2017-04-27 – 2017-05-03 (×7): 40 mg via ORAL
  Filled 2017-04-27 (×7): qty 1

## 2017-04-27 MED ORDER — PHENYLEPHRINE HCL 10 MG/ML IJ SOLN
INTRAMUSCULAR | Status: DC | PRN
  Administered 2017-04-27: 40 ug/min via INTRAVENOUS

## 2017-04-27 MED ORDER — GLYCOPYRROLATE 0.2 MG/ML IJ SOLN
INTRAMUSCULAR | Status: DC | PRN
  Administered 2017-04-27: 0.2 mg via INTRAVENOUS

## 2017-04-27 MED ORDER — PHENYLEPHRINE 40 MCG/ML (10ML) SYRINGE FOR IV PUSH (FOR BLOOD PRESSURE SUPPORT)
PREFILLED_SYRINGE | INTRAVENOUS | Status: AC
  Filled 2017-04-27: qty 10

## 2017-04-27 MED ORDER — PHENYLEPHRINE HCL 10 MG/ML IJ SOLN
INTRAMUSCULAR | Status: DC | PRN
  Administered 2017-04-27: 80 ug via INTRAVENOUS

## 2017-04-27 MED ORDER — POTASSIUM CHLORIDE CRYS ER 20 MEQ PO TBCR
20.0000 meq | EXTENDED_RELEASE_TABLET | Freq: Every day | ORAL | Status: DC
  Administered 2017-04-28 – 2017-05-03 (×6): 20 meq via ORAL
  Filled 2017-04-27 (×6): qty 1

## 2017-04-27 MED ORDER — CEFAZOLIN SODIUM-DEXTROSE 2-4 GM/100ML-% IV SOLN
INTRAVENOUS | Status: AC
  Filled 2017-04-27: qty 100

## 2017-04-27 MED ORDER — 0.9 % SODIUM CHLORIDE (POUR BTL) OPTIME
TOPICAL | Status: DC | PRN
  Administered 2017-04-27: 1000 mL

## 2017-04-27 MED ORDER — LACTATED RINGERS IV SOLN
INTRAVENOUS | Status: DC | PRN
  Administered 2017-04-27: 08:00:00 via INTRAVENOUS

## 2017-04-27 MED ORDER — ARTIFICIAL TEARS OPHTHALMIC OINT
TOPICAL_OINTMENT | OPHTHALMIC | Status: DC | PRN
  Administered 2017-04-27: 1 via OPHTHALMIC

## 2017-04-27 MED ORDER — SUGAMMADEX SODIUM 200 MG/2ML IV SOLN
INTRAVENOUS | Status: AC
  Filled 2017-04-27: qty 2

## 2017-04-27 MED ORDER — VITAMIN D 1000 UNITS PO TABS
1000.0000 [IU] | ORAL_TABLET | Freq: Every day | ORAL | Status: DC
  Administered 2017-04-28 – 2017-05-03 (×6): 1000 [IU] via ORAL
  Filled 2017-04-27 (×6): qty 1

## 2017-04-27 MED ORDER — HYDROMORPHONE HCL 1 MG/ML IJ SOLN
INTRAMUSCULAR | Status: AC
  Administered 2017-04-27: 0.25 mg via INTRAVENOUS
  Filled 2017-04-27: qty 1

## 2017-04-27 MED ORDER — PROPOFOL 10 MG/ML IV BOLUS
INTRAVENOUS | Status: AC
  Filled 2017-04-27: qty 40

## 2017-04-27 MED ORDER — DEXAMETHASONE SODIUM PHOSPHATE 10 MG/ML IJ SOLN
INTRAMUSCULAR | Status: AC
  Filled 2017-04-27: qty 1

## 2017-04-27 MED ORDER — SODIUM CHLORIDE 0.9% FLUSH
3.0000 mL | Freq: Two times a day (BID) | INTRAVENOUS | Status: DC
  Administered 2017-04-27 – 2017-05-03 (×12): 3 mL via INTRAVENOUS

## 2017-04-27 MED ORDER — PROPOFOL 10 MG/ML IV BOLUS
INTRAVENOUS | Status: AC
  Filled 2017-04-27: qty 20

## 2017-04-27 MED ORDER — GABAPENTIN 100 MG PO CAPS
200.0000 mg | ORAL_CAPSULE | Freq: Three times a day (TID) | ORAL | Status: DC
  Administered 2017-04-27 – 2017-05-03 (×17): 200 mg via ORAL
  Filled 2017-04-27 (×18): qty 2

## 2017-04-27 MED ORDER — ROCURONIUM BROMIDE 100 MG/10ML IV SOLN
INTRAVENOUS | Status: DC | PRN
  Administered 2017-04-27: 10 mg via INTRAVENOUS
  Administered 2017-04-27: 20 mg via INTRAVENOUS
  Administered 2017-04-27: 60 mg via INTRAVENOUS

## 2017-04-27 MED ORDER — LIDOCAINE 2% (20 MG/ML) 5 ML SYRINGE
INTRAMUSCULAR | Status: AC
  Filled 2017-04-27: qty 5

## 2017-04-27 MED ORDER — SUGAMMADEX SODIUM 200 MG/2ML IV SOLN
INTRAVENOUS | Status: DC | PRN
  Administered 2017-04-27: 200 mg via INTRAVENOUS

## 2017-04-27 MED ORDER — PROMETHAZINE HCL 25 MG/ML IJ SOLN: 6.2500 mg | INTRAMUSCULAR | Status: DC | PRN

## 2017-04-27 MED ORDER — ACETAMINOPHEN 325 MG PO TABS
650.0000 mg | ORAL_TABLET | ORAL | Status: DC | PRN
  Administered 2017-04-29 – 2017-05-03 (×10): 650 mg via ORAL
  Filled 2017-04-27 (×10): qty 2

## 2017-04-27 MED ORDER — VENLAFAXINE HCL ER 75 MG PO CP24
75.0000 mg | ORAL_CAPSULE | Freq: Every day | ORAL | Status: DC
Start: 2017-04-28 — End: 2017-05-03
  Administered 2017-04-28 – 2017-05-03 (×6): 75 mg via ORAL
  Filled 2017-04-27 (×6): qty 1

## 2017-04-27 MED ORDER — SODIUM CHLORIDE 0.9 % IR SOLN
Status: DC | PRN
  Administered 2017-04-27: 09:00:00

## 2017-04-27 MED ORDER — LACTATED RINGERS IV SOLN
INTRAVENOUS | Status: DC | PRN
  Administered 2017-04-27: 07:00:00 via INTRAVENOUS

## 2017-04-27 MED ORDER — MENTHOL 3 MG MT LOZG: 1.0000 | LOZENGE | OROMUCOSAL | Status: DC | PRN

## 2017-04-27 MED ORDER — BACITRACIN ZINC 500 UNIT/GM EX OINT
TOPICAL_OINTMENT | CUTANEOUS | Status: DC | PRN
  Administered 2017-04-27: 1 via TOPICAL

## 2017-04-27 MED ORDER — MORPHINE SULFATE (PF) 4 MG/ML IV SOLN
4.0000 mg | INTRAVENOUS | Status: DC | PRN
  Administered 2017-04-29: 4 mg via INTRAVENOUS
  Filled 2017-04-27 (×2): qty 1

## 2017-04-27 MED ORDER — EPHEDRINE SULFATE 50 MG/ML IJ SOLN
INTRAMUSCULAR | Status: DC | PRN
  Administered 2017-04-27 (×2): 10 mg via INTRAVENOUS

## 2017-04-27 MED ORDER — CEFAZOLIN SODIUM-DEXTROSE 2-4 GM/100ML-% IV SOLN
2.0000 g | Freq: Three times a day (TID) | INTRAVENOUS | Status: AC
  Administered 2017-04-27 – 2017-04-28 (×2): 2 g via INTRAVENOUS
  Filled 2017-04-27 (×2): qty 100

## 2017-04-27 MED ORDER — DEXAMETHASONE SODIUM PHOSPHATE 10 MG/ML IJ SOLN
INTRAMUSCULAR | Status: DC | PRN
  Administered 2017-04-27: 10 mg via INTRAVENOUS

## 2017-04-27 MED ORDER — FENTANYL CITRATE (PF) 250 MCG/5ML IJ SOLN
INTRAMUSCULAR | Status: AC
  Filled 2017-04-27: qty 5

## 2017-04-27 MED ORDER — BUPIVACAINE-EPINEPHRINE (PF) 0.5% -1:200000 IJ SOLN
INTRAMUSCULAR | Status: AC
  Filled 2017-04-27: qty 30

## 2017-04-27 MED ORDER — CEFAZOLIN SODIUM-DEXTROSE 2-4 GM/100ML-% IV SOLN
2.0000 g | INTRAVENOUS | Status: AC
  Administered 2017-04-27: 2 g via INTRAVENOUS

## 2017-04-27 MED ORDER — PROPOFOL 10 MG/ML IV BOLUS
INTRAVENOUS | Status: DC | PRN
  Administered 2017-04-27: 100 mg via INTRAVENOUS

## 2017-04-27 MED ORDER — PHENOL 1.4 % MT LIQD: 1.0000 | OROMUCOSAL | Status: DC | PRN

## 2017-04-27 MED ORDER — AMLODIPINE BESYLATE 5 MG PO TABS
5.0000 mg | ORAL_TABLET | Freq: Every day | ORAL | Status: DC
  Administered 2017-04-28 – 2017-05-03 (×5): 5 mg via ORAL
  Filled 2017-04-27 (×5): qty 1

## 2017-04-27 MED ORDER — CYCLOBENZAPRINE HCL 10 MG PO TABS
10.0000 mg | ORAL_TABLET | Freq: Three times a day (TID) | ORAL | Status: DC | PRN
  Administered 2017-04-29 – 2017-04-30 (×2): 10 mg via ORAL
  Filled 2017-04-27 (×2): qty 1

## 2017-04-27 MED ORDER — SODIUM CHLORIDE 0.9% FLUSH: 3.0000 mL | INTRAVENOUS | Status: DC | PRN

## 2017-04-27 MED ORDER — PRAVASTATIN SODIUM 10 MG PO TABS
10.0000 mg | ORAL_TABLET | Freq: Every evening | ORAL | Status: DC
  Administered 2017-04-27 – 2017-05-02 (×6): 10 mg via ORAL
  Filled 2017-04-27 (×5): qty 1

## 2017-04-27 MED ORDER — ONDANSETRON HCL 4 MG/2ML IJ SOLN: 4.0000 mg | Freq: Four times a day (QID) | INTRAMUSCULAR | Status: DC | PRN

## 2017-04-27 MED ORDER — BACITRACIN ZINC 500 UNIT/GM EX OINT
TOPICAL_OINTMENT | CUTANEOUS | Status: AC
  Filled 2017-04-27: qty 28.35

## 2017-04-27 MED ORDER — THROMBIN 5000 UNITS EX SOLR
OROMUCOSAL | Status: DC | PRN
  Administered 2017-04-27: 09:00:00 via TOPICAL

## 2017-04-27 MED FILL — Heparin Sodium (Porcine) Inj 1000 Unit/ML: INTRAMUSCULAR | Qty: 30 | Status: AC

## 2017-04-27 MED FILL — Sodium Chloride IV Soln 0.9%: INTRAVENOUS | Qty: 1000 | Status: AC

## 2017-04-27 SURGICAL SUPPLY — 64 items
BAG DECANTER FOR FLEXI CONT (MISCELLANEOUS) ×2 IMPLANT
BENZOIN TINCTURE PRP APPL 2/3 (GAUZE/BANDAGES/DRESSINGS) ×2 IMPLANT
BLADE CLIPPER SURG (BLADE) IMPLANT
BUR MATCHSTICK NEURO 3.0 LAGG (BURR) ×2 IMPLANT
BUR PRECISION FLUTE 6.0 (BURR) ×2 IMPLANT
CANISTER SUCT 3000ML PPV (MISCELLANEOUS) ×2 IMPLANT
CAP REVERE LOCKING (Cap) ×8 IMPLANT
CARTRIDGE OIL MAESTRO DRILL (MISCELLANEOUS) ×1 IMPLANT
CONT SPEC 4OZ CLIKSEAL STRL BL (MISCELLANEOUS) ×2 IMPLANT
COVER BACK TABLE 60X90IN (DRAPES) ×2 IMPLANT
DIFFUSER DRILL AIR PNEUMATIC (MISCELLANEOUS) ×2 IMPLANT
DRAPE C-ARM 42X72 X-RAY (DRAPES) ×4 IMPLANT
DRAPE HALF SHEET 40X57 (DRAPES) ×2 IMPLANT
DRAPE LAPAROTOMY 100X72X124 (DRAPES) ×2 IMPLANT
DRAPE POUCH INSTRU U-SHP 10X18 (DRAPES) ×2 IMPLANT
DRAPE SURG 17X23 STRL (DRAPES) ×8 IMPLANT
ELECT BLADE 4.0 EZ CLEAN MEGAD (MISCELLANEOUS) ×2
ELECT REM PT RETURN 9FT ADLT (ELECTROSURGICAL) ×2
ELECTRODE BLDE 4.0 EZ CLN MEGD (MISCELLANEOUS) ×1 IMPLANT
ELECTRODE REM PT RTRN 9FT ADLT (ELECTROSURGICAL) ×1 IMPLANT
EVACUATOR 1/8 PVC DRAIN (DRAIN) IMPLANT
GAUZE SPONGE 4X4 12PLY STRL (GAUZE/BANDAGES/DRESSINGS) ×2 IMPLANT
GAUZE SPONGE 4X4 16PLY XRAY LF (GAUZE/BANDAGES/DRESSINGS) ×2 IMPLANT
GLOVE BIO SURGEON STRL SZ8 (GLOVE) ×6 IMPLANT
GLOVE BIO SURGEON STRL SZ8.5 (GLOVE) ×4 IMPLANT
GLOVE BIOGEL PI IND STRL 7.0 (GLOVE) ×3 IMPLANT
GLOVE BIOGEL PI INDICATOR 7.0 (GLOVE) ×3
GLOVE EXAM NITRILE LRG STRL (GLOVE) IMPLANT
GLOVE EXAM NITRILE XL STR (GLOVE) IMPLANT
GLOVE EXAM NITRILE XS STR PU (GLOVE) IMPLANT
GLOVE INDICATOR 8.5 STRL (GLOVE) ×2 IMPLANT
GLOVE SURG SS PI 7.0 STRL IVOR (GLOVE) ×10 IMPLANT
GOWN STRL REUS W/ TWL LRG LVL3 (GOWN DISPOSABLE) ×2 IMPLANT
GOWN STRL REUS W/ TWL XL LVL3 (GOWN DISPOSABLE) ×3 IMPLANT
GOWN STRL REUS W/TWL 2XL LVL3 (GOWN DISPOSABLE) IMPLANT
GOWN STRL REUS W/TWL LRG LVL3 (GOWN DISPOSABLE) ×2
GOWN STRL REUS W/TWL XL LVL3 (GOWN DISPOSABLE) ×3
HEMOSTAT POWDER KIT SURGIFOAM (HEMOSTASIS) ×2 IMPLANT
KIT BASIN OR (CUSTOM PROCEDURE TRAY) ×2 IMPLANT
KIT ROOM TURNOVER OR (KITS) ×2 IMPLANT
NEEDLE HYPO 21X1.5 SAFETY (NEEDLE) ×2 IMPLANT
NEEDLE HYPO 22GX1.5 SAFETY (NEEDLE) ×2 IMPLANT
NS IRRIG 1000ML POUR BTL (IV SOLUTION) ×2 IMPLANT
OIL CARTRIDGE MAESTRO DRILL (MISCELLANEOUS) ×2
PACK LAMINECTOMY NEURO (CUSTOM PROCEDURE TRAY) ×2 IMPLANT
PAD ARMBOARD 7.5X6 YLW CONV (MISCELLANEOUS) ×6 IMPLANT
PATTIES SURGICAL .5 X1 (DISPOSABLE) IMPLANT
ROD CURVED REVERE 6.35X50MM (Rod) ×4 IMPLANT
SCREW REVERE 6.35 6.5MMX45 (Screw) ×8 IMPLANT
SPACER ALTERA 10X31-15 (Spacer) ×2 IMPLANT
SPONGE LAP 4X18 X RAY DECT (DISPOSABLE) IMPLANT
SPONGE NEURO XRAY DETECT 1X3 (DISPOSABLE) IMPLANT
SPONGE SURGIFOAM ABS GEL 100 (HEMOSTASIS) ×2 IMPLANT
STRIP BIOACTIVE 20CC 25X100X8 (Miscellaneous) ×2 IMPLANT
STRIP CLOSURE SKIN 1/2X4 (GAUZE/BANDAGES/DRESSINGS) ×2 IMPLANT
SUT VIC AB 1 CT1 18XBRD ANBCTR (SUTURE) ×2 IMPLANT
SUT VIC AB 1 CT1 8-18 (SUTURE) ×2
SUT VIC AB 2-0 CP2 18 (SUTURE) ×4 IMPLANT
SYRINGE 20CC LL (MISCELLANEOUS) ×2 IMPLANT
TAPE CLOTH SURG 4X10 WHT LF (GAUZE/BANDAGES/DRESSINGS) ×2 IMPLANT
TOWEL GREEN STERILE (TOWEL DISPOSABLE) ×2 IMPLANT
TOWEL GREEN STERILE FF (TOWEL DISPOSABLE) ×2 IMPLANT
TRAY FOLEY W/METER SILVER 16FR (SET/KITS/TRAYS/PACK) ×2 IMPLANT
WATER STERILE IRR 1000ML POUR (IV SOLUTION) ×2 IMPLANT

## 2017-04-27 NOTE — Transfer of Care (Signed)
Immediate Anesthesia Transfer of Care Note  Patient: Penny Hart  Procedure(s) Performed: POSTERIOR LUMBAR INTERBODY FUSION, INTERBODY PROSTHESIS, POSTERIOR INSTRUMENTATION LUMBAR FOUR- LUMBAR FIVE; LUMBAR THREE- LUMBAR FOUR AND LUMBAR FIVE- SACRAL ONE LAMINECTOMY (N/A Back)  Patient Location: PACU  Anesthesia Type:General  Level of Consciousness: patient cooperative  Airway & Oxygen Therapy: Patient Spontanous Breathing and Patient connected to nasal cannula oxygen  Post-op Assessment: Report given to RN and Post -op Vital signs reviewed and stable  Post vital signs: Reviewed and stable  Last Vitals:  Vitals:   04/27/17 0602  BP: 127/75  Pulse: 85  Resp: 20  Temp: 37 C    Last Pain:  Vitals:   04/27/17 0602  TempSrc: Oral         Complications: No apparent anesthesia complications

## 2017-04-27 NOTE — Anesthesia Postprocedure Evaluation (Signed)
Anesthesia Post Note  Patient: Penny Hart  Procedure(s) Performed: POSTERIOR LUMBAR INTERBODY FUSION, INTERBODY PROSTHESIS, POSTERIOR INSTRUMENTATION LUMBAR FOUR- LUMBAR FIVE; LUMBAR THREE- LUMBAR FOUR AND LUMBAR FIVE- SACRAL ONE LAMINECTOMY (N/A Back)     Patient location during evaluation: PACU Anesthesia Type: General Level of consciousness: awake and alert Pain management: pain level controlled Vital Signs Assessment: post-procedure vital signs reviewed and stable Respiratory status: spontaneous breathing, nonlabored ventilation, respiratory function stable and patient connected to nasal cannula oxygen Cardiovascular status: blood pressure returned to baseline and stable Postop Assessment: no apparent nausea or vomiting Anesthetic complications: no    Last Vitals:  Vitals:   04/27/17 0602 04/27/17 1120  BP: 127/75 124/61  Pulse: 85 (!) 58  Resp: 20 17  Temp: 37 C (!) 36.1 C  SpO2:  99%    Last Pain:  Vitals:   04/27/17 1120  TempSrc:   PainSc: 0-No pain                 Bronsyn Shappell S

## 2017-04-27 NOTE — Anesthesia Procedure Notes (Signed)
Procedure Name: Intubation Date/Time: 04/27/2017 7:38 AM Performed by: Candis Shine Pre-anesthesia Checklist: Patient identified, Emergency Drugs available, Suction available and Patient being monitored Patient Re-evaluated:Patient Re-evaluated prior to induction Oxygen Delivery Method: Circle System Utilized Preoxygenation: Pre-oxygenation with 100% oxygen Induction Type: IV induction Ventilation: Mask ventilation without difficulty Laryngoscope Size: Mac and 3 Grade View: Grade I Tube type: Oral Tube size: 7.0 mm Number of attempts: 1 Airway Equipment and Method: Stylet Placement Confirmation: ETT inserted through vocal cords under direct vision,  positive ETCO2 and breath sounds checked- equal and bilateral Secured at: 22 cm Tube secured with: Tape Dental Injury: Teeth and Oropharynx as per pre-operative assessment

## 2017-04-27 NOTE — Evaluation (Signed)
Physical Therapy Evaluation Patient Details Name: Penny Hart MRN: [id number] DOB: [birth year] Today's Date: 04/27/2017   History of Present Illness  Pt is a 71 y/o female s/p TLIF L4-L5 and decompressive laminotomies L3-S1. PMH including but not limited to CHF, COPD, HTN, and DM.  Clinical Impression  Pt presented supine in bed with HOB elevated, awake and willing to participate in therapy session. Prior to admission, pt reported that she was very independent and active with all functional mobility and ADLs. Pt lives alone in a single level home and has family that can provide some assistance upon d/c. Pt limited this session secondary to lethargy and weakness, especially in R LE. Pt with buckling of R knee in weight bearing and unable to advance forwards. Upon standing, pt with LOB posteriorly x2 requiring mod A to maintain upright standing. PT reviewed 3/3 back precautions and log roll technique with pt and pt's family. Pt would continue to benefit from skilled physical therapy services at this time while admitted and after d/c to address the below listed limitations in order to improve overall safety and independence with functional mobility.     Follow Up Recommendations CIR;Supervision/Assistance - 24 hour    Equipment Recommendations  None recommended by PT    Recommendations for Other Services Rehab consult     Precautions / Restrictions Precautions Precautions: Fall;Back Precaution Booklet Issued: No Precaution Comments: PT reviewed 3/3 back precautions with pt and family Required Braces or Orthoses: Spinal Brace Spinal Brace: Lumbar corset;Applied in sitting position Restrictions Weight Bearing Restrictions: No      Mobility  Bed Mobility Overal bed mobility: Needs Assistance Bed Mobility: Rolling;Sidelying to Sit;Sit to Sidelying Rolling: Min assist Sidelying to sit: Mod assist     Sit to sidelying: Min assist General bed mobility comments: increased time and  effort, use of bed rails, cueing for log roll technique, assist with bilateral LEs and to elevate trunk  Transfers Overall transfer level: Needs assistance Equipment used: Rolling walker (2 wheeled) Transfers: Sit to/from Stand Sit to Stand: Mod assist         General transfer comment: increased time and effort, cueing for technique, mod A to power into standing from EOB and with stability upon standing as pt had LOB posteriorly x2  Ambulation/Gait             General Gait Details: deferred at this time secondary to pt being lethargic, impaired sensation in R LE with R knee buckling in standing  Stairs            Wheelchair Mobility    Modified Rankin (Stroke Patients Only)       Balance Overall balance assessment: Needs assistance Sitting-balance support: Feet supported Sitting balance-Leahy Scale: Fair     Standing balance support: During functional activity;No upper extremity supported Standing balance-Leahy Scale: Poor Standing balance comment: pt with LOB x2 posteriorly in standing, requiring mod A to maintain upright                             Pertinent Vitals/Pain Pain Assessment: Faces Faces Pain Scale: No hurt    Home Living Family/patient expects to be discharged to:: Private residence Living Arrangements: Alone Available Help at Discharge: Family;Available PRN/intermittently Type of Home: House Home Access: Level entry     Home Layout: One level Home Equipment: Walker - 2 wheels      Prior Function Level of Independence: Independent  Hand Dominance        Extremity/Trunk Assessment   Upper Extremity Assessment Upper Extremity Assessment: Defer to OT evaluation    Lower Extremity Assessment Lower Extremity Assessment: Generalized weakness;RLE deficits/detail RLE Deficits / Details: pt with impaired sensation to light touch throughout and weakness post-op with buckling of R knee with weight  bearing    Cervical / Trunk Assessment Cervical / Trunk Assessment: Other exceptions Cervical / Trunk Exceptions: s/p lumbar sx  Communication   Communication: No difficulties  Cognition Arousal/Alertness: Lethargic Behavior During Therapy: WFL for tasks assessed/performed Overall Cognitive Status: Within Functional Limits for tasks assessed                                        General Comments General comments (skin integrity, edema, etc.): multiple family members present throughout session    Exercises     Assessment/Plan    PT Assessment Patient needs continued PT services  PT Problem List Decreased strength;Decreased activity tolerance;Decreased balance;Decreased mobility;Decreased coordination;Decreased knowledge of use of DME;Decreased safety awareness;Decreased knowledge of precautions;Pain       PT Treatment Interventions DME instruction;Gait training;Stair training;Functional mobility training;Therapeutic activities;Therapeutic exercise;Neuromuscular re-education;Balance training;Patient/family education    PT Goals (Current goals can be found in the Care Plan section)  Acute Rehab PT Goals Patient Stated Goal: return to independence PT Goal Formulation: With patient/family Time For Goal Achievement: 05/11/17 Potential to Achieve Goals: Good    Frequency Min 5X/week   Barriers to discharge        Co-evaluation               AM-PAC PT "6 Clicks" Daily Activity  Outcome Measure Difficulty turning over in bed (including adjusting bedclothes, sheets and blankets)?: Unable Difficulty moving from lying on back to sitting on the side of the bed? : Unable Difficulty sitting down on and standing up from a chair with arms (e.g., wheelchair, bedside commode, etc,.)?: Unable Help needed moving to and from a bed to chair (including a wheelchair)?: A Lot Help needed walking in hospital room?: A Lot Help needed climbing 3-5 steps with a railing? :  Total 6 Click Score: 8    End of Session Equipment Utilized During Treatment: Gait belt;Back brace Activity Tolerance: Patient limited by lethargy;Patient limited by fatigue Patient left: in bed;with call bell/phone within reach;with bed alarm set;with family/visitor present Nurse Communication: Mobility status PT Visit Diagnosis: Other abnormalities of gait and mobility (R26.89);Unsteadiness on feet (R26.81)    Time: 9562-1308 PT Time Calculation (min) (ACUTE ONLY): 23 min   Charges:   PT Evaluation $PT Eval Moderate Complexity: 1 Mod PT Treatments $Therapeutic Activity: 8-22 mins   PT G Codes:        Reynolds, PT, DPT [place] 04/27/2017, 5:38 PM

## 2017-04-27 NOTE — H&P (Signed)
Subjective: The patient is a 71 year old black female who has complained of back and leg pain consistent with neurogenic claudication. She has failed medical management and was worked up with a lumbar myelo CT. This demonstrated the patient had a spondylolisthesis at L4-5 with stenosis at L3-4, L4-5 and L5-S1. I discussed the various treatment options with the patient including surgery. She has decided to proceed with a lumbar decompression, instrumentation, and fusion.   Past Medical History:  Diagnosis Date  . Arthritis   . CHF (congestive heart failure) ([place])   . COPD (chronic obstructive pulmonary disease) ([place])   . Diabetes mellitus without complication ([place])   . GERD (gastroesophageal reflux disease)   . Glaucoma   . Hiatal hernia   . Hyperlipidemia   . Hypertension   . Neuropathy   . Sleep apnea   . Spondylolisthesis of lumbar region     Past Surgical History:  Procedure Laterality Date  . ABDOMINAL HYSTERECTOMY    . COLONOSCOPY WITH PROPOFOL N/A 05/30/2015   Procedure: COLONOSCOPY WITH PROPOFOL;  Surgeon: Lucilla Lame, MD;  Location: [facility] ENDOSCOPY;  Service: Endoscopy;  Laterality: N/A;  . ESOPHAGOGASTRODUODENOSCOPY (EGD) WITH PROPOFOL N/A 05/30/2015   Procedure: ESOPHAGOGASTRODUODENOSCOPY (EGD) WITH PROPOFOL;  Surgeon: Lucilla Lame, MD;  Location: [facility] ENDOSCOPY;  Service: Endoscopy;  Laterality: N/A;  . EYE SURGERY     Bialteral for glaucoma  . Eye surgery for glaucoma    . JOINT REPLACEMENT     Left  . Left total knee replacement    . OOPHORECTOMY    . TUBAL LIGATION      Allergies  Allergen Reactions  . Oxycodone-Acetaminophen     UNSPECIFIED REACTION   . Tramadol Rash and Other (See Comments)    INTOLERANCE > Chest pain    Social History  Substance Use Topics  . Smoking status: Former Research scientist (life sciences)  . Smokeless tobacco: Never Used  . Alcohol use No    Family History  Problem Relation Age of Onset  . Diabetes Mother    Prior to Admission medications   Medication  Sig Start Date End Date Taking? Authorizing Provider  amLODipine (NORVASC) 5 MG tablet Take 1 tablet by mouth daily.   Yes [provider]  cholecalciferol (VITAMIN D) 1000 units tablet Take 1,000 Units by mouth daily.   Yes [provider]  furosemide (LASIX) 20 MG tablet Take 1 tablet by mouth daily.   Yes [provider]  gabapentin (NEURONTIN) 100 MG capsule Take 200 mg by mouth 3 (three) times daily.    Yes [provider]  hydrocortisone (ANUSOL-HC) 25 MG suppository Place 25 mg rectally 2 (two) times daily as needed for hemorrhoids or itching.   Yes [provider]  metoprolol tartrate (LOPRESSOR) 25 MG tablet Take 12.5 mg by mouth 2 (two) times daily.    Yes [provider]  omeprazole (PRILOSEC) 20 MG capsule Take 20 mg by mouth daily as needed.   Yes [provider]  potassium chloride SA (K-DUR,KLOR-CON) 20 MEQ tablet Take 1 tablet by mouth daily.   Yes [provider]  pravastatin (PRAVACHOL) 10 MG tablet Take 1 tablet by mouth every evening.   Yes [provider]  venlafaxine XR (EFFEXOR-XR) 75 MG 24 hr capsule Take 75 mg by mouth daily with breakfast.   Yes [provider]     Review of Systems  Positive ROS: As above  All other systems have been reviewed and were otherwise negative with the exception of those mentioned  in the HPI and as above.  Objective: Vital signs in last 24 hours: Temp:  [98.6 F (37 C)] 98.6 F (37 C) (10/04 0602) Pulse Rate:  [85] 85 (10/04 0602) Resp:  [20] 20 (10/04 0602) BP: (127)/(75) 127/75 (10/04 0602)  General Appearance: Alert Head: Normocephalic, without obvious abnormality, atraumatic Eyes: PERRL, conjunctiva/corneas clear, EOM's intact,    Ears: Normal  Throat: Normal  Neck: Supple, Back: unremarkable Lungs: Clear to auscultation bilaterally, respirations unlabored Heart: Regular rate and rhythm, no murmur, rub or gallop Abdomen: Soft,  non-tender Extremities: Extremities normal, atraumatic, no cyanosis or edema Skin: unremarkable  NEUROLOGIC:   Mental status: alert and oriented,Motor Exam - grossly normal Sensory Exam - grossly normal Reflexes:  Coordination - grossly normal Gait - grossly normal Balance - grossly normal Cranial Nerves: I: smell Not tested  II: visual acuity  OS: Normal  OD: Normal   II: visual fields Full to confrontation  II: pupils Equal, round, reactive to light  III,VII: ptosis None  III,IV,VI: extraocular muscles  Full ROM  V: mastication Normal  V: facial light touch sensation  Normal  V,VII: corneal reflex  Present  VII: facial muscle function - upper  Normal  VII: facial muscle function - lower Normal  VIII: hearing Not tested  IX: soft palate elevation  Normal  IX,X: gag reflex Present  XI: trapezius strength  5/5  XI: sternocleidomastoid strength 5/5  XI: neck flexion strength  5/5  XII: tongue strength  Normal    Data Review Lab Results  Component Value Date   WBC 5.5 04/20/2017   HGB 13.2 04/20/2017   HCT 42.4 04/20/2017   MCV 87.6 04/20/2017   PLT 260 04/20/2017   Lab Results  Component Value Date   NA 137 04/20/2017   K 3.8 04/20/2017   CL 104 04/20/2017   CO2 23 04/20/2017   BUN 7 04/20/2017   CREATININE 0.79 04/20/2017   GLUCOSE 96 04/20/2017   No results found for: INR, PROTIME  Assessment/Plan: At L3-4, L4-5 and L5-S1 spinal stenosis, L4-5 spondylolisthesis, lumbago, lumbar radicular pain, neurogenic claudication: I have discussed the situation with the patient and reviewed her imaging studies with her. We have discussed the various treatment options including surgery. I have described the surgical treatment option of a L3-4, L4-5 and L5-S1 laminectomy with instrumentation and fusion at L4-5. I have described the surgery to her. I have shown her surgical models. I have given her a surgical pamphlet. We have discussed the risks, benefits, alternatives,  expected postoperative course, and likelihood of achieving her goals with surgery. I have answered all her questions. She has decided to proceed with surgery.   Karris Deangelo D 04/27/2017 7:23 AM

## 2017-04-27 NOTE — Op Note (Signed)
Brief history: The patient is a 71 year old white female who has complained of back, buttock and leg pain consistent with neurogenic claudication. She has failed medical management was worked up with a lumbar myelo CT. This demonstrated the patient had spinal stenosis at L34, L4-5 and L5-S1 with a spondylolisthesis at L4-5. I discussed the various treatment options with the patient including surgery. She has weighed the risks, benefits, and alternatives to surgery and decided proceed with a lumbar laminectomy, his mentation, and fusion.  Preoperative diagnosis: L4-5 spondylolisthesis, L3-4, L4-5 and L5-S1 spinal stenosis compressing the L4, L5 and S1 nerve roots; lumbago; lumbar radiculopathy  Postoperative diagnosis: The same  Procedure: L3-4 bilateral Laminotomy/foraminotomies, L4-5 and L5-S1 laminectomy/foraminotomy to decompress the bilateral L4, L5 and S1 nerve roots(the work required to do this was in addition to the work required to do the posterior lumbar interbody fusion because of the patient's spinal stenosis, facet arthropathy. Etc. requiring a wide decompression of the nerve roots.); L4-5 transforaminal lumbar interbody fusion with local morselized autograft bone and Kinnex graft extender; insertion of interbody prosthesis at L4-5 (globus peek expandable interbody prosthesis); posterior nonsegmental instrumentation from L4 to L5 with globus titanium pedicle screws and rods; posterior lateral arthrodesis at L4-5 with local morselized autograft bone and Kinnex bone graft extender.  Surgeon: Dr. Earle Gell  Asst.: Dr. Saintclair Halsted  Anesthesia: Gen. endotracheal  Estimated blood loss: 250 mL  Drains: None  Complications: None  Description of procedure: The patient was brought to the operating room by the anesthesia team. General endotracheal anesthesia was induced. The patient was turned to the prone position on the Wilson frame. The patient's lumbosacral region was then prepared with Betadine  scrub and Betadine solution. Sterile drapes were applied.  I then injected the area to be incised with Marcaine with epinephrine solution. I then used the scalpel to make a linear midline incision over the L3-4, L4-5 and L5-S1 interspace. I then used electrocautery to perform a bilateral subperiosteal dissection exposing the spinous p L3-4, L4-5 and L5-S1 bilaterally rocess and lamina of L3, L4, L5 and the upper sacrum. We then obtained intraoperative radiograph to confirm our location. We then inserted the Verstrac retractor to provide exposure. We began the decompression by incising the interspinous ligament at L3-4, L4-5 and L5-S1. I used a Leksell rongeur to remove the L4 and L5 spinous process.  I began the decompression by using the high speed drill to perform laminotomies at L3-4, L4-5 and L5-S1 bilaterally. We then used the Kerrison punches to complete the L4 and L5 laminectomy and to widen the bilateral laminotomy at L3-4 and removed the ligamentum flavum at L3-4, L4-5 and L5-S1. We used the Kerrison punches to remove the medial facets at L3-4, L4-5 and L5-S1. We performed wide foraminotomies about the bilateral L4, L5 and S1 nerve roots completing the decompression.  We now turned our attention to the posterior lumbar interbody fusion. I used a scalpel to incise the intervertebral disc at L4-5 bilaterally. I then performed a partial intervertebral discectomy at L4-5 bilaterally using the pituitary forceps. We prepared the vertebral endplates at C1-4 bilaterally for the fusion by removing the soft tissues with the curettes. We then used the trial spacers to pick the appropriate sized interbody prosthesis. We prefilled his prosthesis with a combination of local morselized autograft bone that we obtained during the decompression as well as Kinnex bone graft extender. We inserted the prefilled prosthesis into the interspace at L4-5 from the right, we then expanded the prosthesis. There was  a good snug  fit of the prosthesis in the interspace. We then filled and the remainder of the intervertebral disc space with local morselized autograft bone and Kinnex. This completed the posterior lumbar interbody arthrodesis.  We now turned attention to the instrumentation. Under fluoroscopic guidance we cannulated the bilateral L4 and L5 pedicles with the bone probe. We then removed the bone probe. We then tapped the pedicle with a 5.5 millimeter tap. We then removed the tap. We probed inside the tapped pedicle with a ball probe to rule out cortical breaches. We then inserted a 6.5 x 45 millimeter pedicle screw into the L4 and L5 pedicles bilaterally under fluoroscopic guidance. We then palpated along the medial aspect of the pedicles to rule out cortical breaches. There were none. The nerve roots were not injured. We then connected the unilateral pedicle screws with a lordotic rod. We compressed the construct and secured the rod in place with the caps. We then tightened the caps appropriately. This completed the instrumentation from L4-5 bilaterally.  We now turned our attention to the posterior lateral arthrodesis at L4-5 bilaterally. We used the high-speed drill to decorticate the remainder of the facets, pars, transverse process at L4-5 bilaterally. We then applied a combination of local morselized autograft bone and Kinnex bone graft extender over these decorticated posterior lateral structures. This completed the posterior lateral arthrodesis.  We then obtained hemostasis using bipolar electrocautery. We irrigated the wound out with bacitracin solution. We inspected the thecal sac and nerve roots and noted they were well decompressed. We then removed the retractor. We placed vancomycin powder in the wound. We reapproximated patient's thoracolumbar fascia with interrupted #1 Vicryl suture. We reapproximated patient's subcutaneous tissue with interrupted 2-0 Vicryl suture. The reapproximated patient's skin with  Steri-Strips and benzoin. The wound was then coated with bacitracin ointment. A sterile dressing was applied. The drapes were removed. The patient was subsequently returned to the supine position where they were extubated by the anesthesia team. He was then transported to the post anesthesia care unit in stable condition. All sponge instrument and needle counts were reportedly correct at the end of this case.

## 2017-04-27 NOTE — Progress Notes (Signed)
Patient ID: Penny Hart, female   DOB: May 17, 1946, 71 y.o.   MRN: [id number] Subjective:  The patient is alert and pleasant. She is in no apparent distress.  Objective: Vital signs in last 24 hours: Temp:  [97 F (36.1 C)-98.6 F (37 C)] 97.7 F (36.5 C) (10/04 1300) Pulse Rate:  [45-85] 45 (10/04 1330) Resp:  [12-20] 12 (10/04 1330) BP: (102-127)/(59-75) 102/66 (10/04 1330) SpO2:  [93 %-99 %] 93 % (10/04 1330)  Intake/Output from previous day: No intake/output data recorded. Intake/Output this shift: Total I/O In: 1600 [I.V.:1600] Out: 420 [Urine:220; Blood:200]  Physical exam the patient is alert and pleasant. Her strength is grossly normal in her bilateral quadriceps, gastrocnemius and dorsiflexors.  Lab Results: No results for input(s): WBC, HGB, HCT, PLT in the last 72 hours. BMET No results for input(s): NA, K, CL, CO2, GLUCOSE, BUN, CREATININE, CALCIUM in the last 72 hours.  Studies/Results: Dg Lumbar Spine 2-3 Views  Result Date: 04/27/2017 CLINICAL DATA:  Status post fusion L4 and L5 EXAM: DG C-ARM 61-120 MIN; LUMBAR SPINE - 2-3 VIEW COMPARISON:  Intraoperative images obtained earlier in the day FLUOROSCOPY TIME:  0 minutes 26 seconds; 2 acquired images FINDINGS: Frontal and lateral views were obtained. There is screw fixation posteriorly at L4 and L5 with pedicle screw tips in the respective vertebral bodies. Disc spacer noted at L4-5. Moderate disc space narrowing noted at L5-S1. No fracture or spondylolisthesis. IMPRESSION: Pedicle screws at L4 and L5 with screw tips in respective vertebral bodies. Disc spacer L4-5. Disc space narrowing L5-S1. No fracture or spondylolisthesis. Electronically Signed   By: Lowella Grip III M.D.   On: 04/27/2017 11:22   Dg Lumbar Spine 1 View  Result Date: 04/27/2017 CLINICAL DATA:  Intraoperative localization for lumbar fusion EXAM: LUMBAR SPINE - 1 VIEW COMPARISON:  11/03/2016 FINDINGS: Intraoperative localization is noted at  L4-5 with surgical instrument along the posterior elements at that level. Surgical retractors are seen as well as radiopaque sponges. The numbering nomenclature is similar to that utilized on prior CT examination. IMPRESSION: Intraoperative localization at L4-5. Electronically Signed   By: Inez Catalina M.D.   On: 04/27/2017 11:50   Dg C-arm 1-60 Min  Result Date: 04/27/2017 CLINICAL DATA:  Status post fusion L4 and L5 EXAM: DG C-ARM 61-120 MIN; LUMBAR SPINE - 2-3 VIEW COMPARISON:  Intraoperative images obtained earlier in the day FLUOROSCOPY TIME:  0 minutes 26 seconds; 2 acquired images FINDINGS: Frontal and lateral views were obtained. There is screw fixation posteriorly at L4 and L5 with pedicle screw tips in the respective vertebral bodies. Disc spacer noted at L4-5. Moderate disc space narrowing noted at L5-S1. No fracture or spondylolisthesis. IMPRESSION: Pedicle screws at L4 and L5 with screw tips in respective vertebral bodies. Disc spacer L4-5. Disc space narrowing L5-S1. No fracture or spondylolisthesis. Electronically Signed   By: Lowella Grip III M.D.   On: 04/27/2017 11:22    Assessment/Plan: The patient is doing well. I spoke with her family.  LOS: 0 days     Zak Gondek D 04/27/2017, 3:01 PM

## 2017-04-27 NOTE — Progress Notes (Signed)
Orthopedic Tech Progress Note Patient Details:  Penny Hart 1945-12-19 [id number] Brace completed by bio-tech Patient ID: Penny Hart, female   DOB: May 21, 1946, 71 y.o.   MRN: [id number]   Penny Hart 04/27/2017, 5:26 PM

## 2017-04-27 NOTE — Anesthesia Preprocedure Evaluation (Signed)
Anesthesia Evaluation  Patient identified by MRN, date of birth, ID band Patient awake    Reviewed: Allergy & Precautions, NPO status , Patient's Chart, lab work & pertinent test results  Airway Mallampati: II  TM Distance: >3 FB Neck ROM: Full    Dental no notable dental hx.    Pulmonary sleep apnea , COPD, former smoker,    Pulmonary exam normal breath sounds clear to auscultation       Cardiovascular hypertension, Normal cardiovascular exam Rhythm:Regular Rate:Normal     Neuro/Psych negative neurological ROS  negative psych ROS   GI/Hepatic Neg liver ROS, GERD  ,  Endo/Other  diabetes  Renal/GU negative Renal ROS  negative genitourinary   Musculoskeletal negative musculoskeletal ROS (+)   Abdominal   Peds negative pediatric ROS (+)  Hematology negative hematology ROS (+)   Anesthesia Other Findings   Reproductive/Obstetrics negative OB ROS                             Anesthesia Physical Anesthesia Plan  ASA: III  Anesthesia Plan: General   Post-op Pain Management:    Induction: Intravenous  PONV Risk Score and Plan: 2 and Ondansetron, Dexamethasone and Treatment may vary due to age or medical condition  Airway Management Planned: Oral ETT  Additional Equipment:   Intra-op Plan:   Post-operative Plan: Extubation in OR  Informed Consent: I have reviewed the patients History and Physical, chart, labs and discussed the procedure including the risks, benefits and alternatives for the proposed anesthesia with the patient or authorized representative who has indicated his/her understanding and acceptance.   Dental advisory given  Plan Discussed with: CRNA and Surgeon  Anesthesia Plan Comments:         Anesthesia Quick Evaluation

## 2017-04-28 LAB — GLUCOSE, CAPILLARY
Glucose-Capillary: 116 mg/dL — ABNORMAL HIGH (ref 65–99)
Glucose-Capillary: 113 mg/dL — ABNORMAL HIGH (ref 65–99)

## 2017-04-28 LAB — CBC
HCT: 36.3 % (ref 36.0–46.0)
Hemoglobin: 11.8 g/dL — ABNORMAL LOW (ref 12.0–15.0)
MCH: 28.2 pg (ref 26.0–34.0)
MCHC: 32.5 g/dL (ref 30.0–36.0)
MCV: 86.6 fL (ref 78.0–100.0)
Platelets: 229 10*3/uL (ref 150–400)
RBC: 4.19 MIL/uL (ref 3.87–5.11)
RDW: 14.5 % (ref 11.5–15.5)
WBC: 14 10*3/uL — ABNORMAL HIGH (ref 4.0–10.5)

## 2017-04-28 LAB — BASIC METABOLIC PANEL
Anion gap: 5 (ref 5–15)
BUN: 7 mg/dL (ref 6–20)
CO2: 25 mmol/L (ref 22–32)
Calcium: 8.7 mg/dL — ABNORMAL LOW (ref 8.9–10.3)
Chloride: 102 mmol/L (ref 101–111)
Creatinine, Ser: 0.81 mg/dL (ref 0.44–1.00)
GFR calc Af Amer: >60 mL/min (ref >60)
GFR calc non Af Amer: >60 mL/min (ref >60)
Glucose, Bld: 125 mg/dL — ABNORMAL HIGH (ref 65–99)
Potassium: 3.8 mmol/L (ref 3.5–5.1)
Sodium: 132 mmol/L — ABNORMAL LOW (ref 135–145)

## 2017-04-28 NOTE — Progress Notes (Signed)
Physical Therapy Treatment Patient Details Name: Penny Hart MRN: [id number] DOB: 1946-02-12 Today's Date: 04/28/2017    History of Present Illness Pt is a 71 y/o female s/p TLIF L4-L5 and decompressive laminotomies L3-S1. PMH including but not limited to CHF, COPD, HTN, and DM.    PT Comments    Pt remains limited secondary to lethargy and R LE weakness. Pt unable to weight shift in standing without R knee buckling. Therefore to perform transfer to chair safely, the Charlaine Dalton was utilized. PT will continue to follow acutely for mobility progression.    Follow Up Recommendations  CIR;Supervision/Assistance - 24 hour     Equipment Recommendations  None recommended by PT    Recommendations for Other Services Rehab consult     Precautions / Restrictions Precautions Precautions: Fall;Back Precaution Booklet Issued: No Precaution Comments: PT reviewed 3/3 back precautions with pt and family Required Braces or Orthoses: Spinal Brace Spinal Brace: Lumbar corset;Applied in sitting position Restrictions Weight Bearing Restrictions: No    Mobility  Bed Mobility Overal bed mobility: Needs Assistance Bed Mobility: Rolling;Sidelying to Sit Rolling: Min assist Sidelying to sit: Min assist       General bed mobility comments: increased time and effort, use of bed rails, cueing for log roll technique, assist with bilateral LEs and to elevate trunk  Transfers Overall transfer level: Needs assistance Equipment used: Rolling walker (2 wheeled) Transfers: Sit to/from [facility] Sit to Stand: Mod assist;+2 physical assistance;+2 safety/equipment;From elevated surface Stand pivot transfers: Mod assist;+2 physical assistance;+2 safety/equipment       General transfer comment: increased time and effort, cueing for technique, mod A to power into standing from EOB and with stability upon standing. attempted stand-pivot transfer with RW and two person assist but pt with R  knee buckling and unable to safely perform; therefore, used the [place].  Ambulation/Gait             General Gait Details: deferred at this time secondary to pt being lethargic, impaired sensation in R LE with R knee buckling in standing   Stairs            Wheelchair Mobility    Modified Rankin (Stroke Patients Only)       Balance Overall balance assessment: Needs assistance Sitting-balance support: Feet supported Sitting balance-Leahy Scale: Fair Sitting balance - Comments: pt able to sit EOB with supervision   Standing balance support: During functional activity;Bilateral upper extremity supported Standing balance-Leahy Scale: Poor Standing balance comment: reliant on UE supports and mod assist                            Cognition Arousal/Alertness: Lethargic Behavior During Therapy: WFL for tasks assessed/performed Overall Cognitive Status: Within Functional Limits for tasks assessed                                        Exercises      General Comments        Pertinent Vitals/Pain Pain Assessment: Faces Faces Pain Scale: No hurt    Home Living                      Prior Function            PT Goals (current goals can now be found in the care plan section) Acute Rehab PT Goals  PT Goal Formulation: With patient/family Time For Goal Achievement: 05/11/17 Potential to Achieve Goals: Good Progress towards PT goals: Progressing toward goals    Frequency    Min 5X/week      PT Plan Current plan remains appropriate    Co-evaluation              AM-PAC PT "6 Clicks" Daily Activity  Outcome Measure  Difficulty turning over in bed (including adjusting bedclothes, sheets and blankets)?: Unable Difficulty moving from lying on back to sitting on the side of the bed? : Unable Difficulty sitting down on and standing up from a chair with arms (e.g., wheelchair, bedside commode, etc,.)?: Unable Help  needed moving to and from a bed to chair (including a wheelchair)?: A Lot Help needed walking in hospital room?: A Lot Help needed climbing 3-5 steps with a railing? : Total 6 Click Score: 8    End of Session Equipment Utilized During Treatment: Gait belt;Back brace Activity Tolerance: Patient limited by lethargy;Patient limited by fatigue Patient left: in chair;with call bell/phone within reach;with chair alarm set Nurse Communication: Mobility status PT Visit Diagnosis: Other abnormalities of gait and mobility (R26.89);Unsteadiness on feet (R26.81)     Time: 1431-1450 PT Time Calculation (min) (ACUTE ONLY): 19 min  Charges:  $Therapeutic Activity: 8-22 mins                    G Codes:       [place], [place], [place] 04/28/2017, 4:27 PM

## 2017-04-28 NOTE — Plan of Care (Signed)
Problem: Activity: Goal: Risk for activity intolerance will decrease Outcome: Progressing Patient up to [facility] and chair with max 2+ assist, tolerating mobility well with extreme RLE weakness.

## 2017-04-28 NOTE — Progress Notes (Signed)
Received patient lying in bed, supine, with mouth open, asleep.Easily awakened. States "as long as I'm lying still, it doesn't hurt too bad.  Encouraged to keep deep breathing and coughing.  Will continue to monitor.

## 2017-04-28 NOTE — Progress Notes (Signed)
Rehab Admissions Coordinator Note:  Patient was screened by Retta Diones for appropriateness for an Inpatient Acute Rehab Consult.  At this time, we are recommending Inpatient Rehab consult.  Jodell Cipro M 04/28/2017, 9:26 AM  I can be reached at [phone].

## 2017-04-28 NOTE — Consult Note (Signed)
     [facility] CM Primary Care Navigator  04/28/2017  Penny Hart 06-09-1946 [id number]   Met with patient at the bedside to identify possible discharge needs. Patient reports having back and leg pain that had led to this admission/ surgery.  Patient endorses Dr. Laurance Flatten [facility] as herprimary care provider.   [facility] [place] and [facility] serviceto obtain medications without difficulty.   She verbalizedmanagingher own medications at home with use of "pill box" system filled weekly.  Patient reports using [employer] transportation to herdoctors'appointments and taxi cab for any other transport needs.   Patient lives alone at home and is independent with self care prior to admission/ surgery.  Daughter Helene Kelp) can assist her with care needs if needed per patient.   Anticipated discharge plan per PT recommendation is CIR [facility] Inpatient [facility]). Awaiting for OT evaluation and physician order.  Patient voiced understanding to call primary care provider's officewhen shereturns backhome,for a post discharge follow-up within a week or sooner if needs arise.Patient letter (with PCP's contact number) was provided as areminder.   Discussed with patient regarding [facility] CM services available for health managementat home and she states that "it is under control and managed at home". Patient voiced understanding to seekreferral from primary care provider to [facility] care management ifdeemed necessary and appropriatefor services in the future- when she returns back home.   [facility] care management information was provided for future needs that she may have.   For questions, please contact:  Dannielle Huh, BSN, RN- [facility] Primary Care Navigator  Telephone: [phone] [place]

## 2017-04-28 NOTE — Care Management Note (Signed)
Case Management Note  Patient Details  Name: Penny Hart MRN: [id number] Date of Birth: 1946-06-13  Subjective/Objective:    Pt underwent:    POSTERIOR LUMBAR INTERBODY FUSION, INTERBODY PROSTHESIS, POSTERIOR INSTRUMENTATION LUMBAR FOUR- LUMBAR FIVE; LUMBAR THREE- LUMBAR FOUR AND LUMBAR FIVE- SACRAL ONE LAMINECTOMY. She is from home alone.               Action/Plan: PT recommending CIR. Awaiting OT eval and physician order. CM following for d/c disposition.  Expected Discharge Date:                  Expected Discharge Plan:  [place]  In-House Referral:     Discharge planning Services  CM Consult  Post Acute Care Choice:    Choice offered to:     DME Arranged:    DME Agency:     HH Arranged:    [place] Agency:     Status of Service:  In process, will continue to follow  If discussed at Long Length of Stay Meetings, dates discussed:    Additional Comments:  Pollie Friar, RN 04/28/2017, 11:19 AM

## 2017-04-28 NOTE — Progress Notes (Signed)
Patient drowsy throughout day, forgetful at times. BLE weak, needing assistance of stedy to transfer from bed to BSC/chair.  Minimal complaints with pain/complaints with movement.  Patient voiding on own throughout day. Continue to monitor patient.

## 2017-04-28 NOTE — Progress Notes (Signed)
Patient is concerned regarding the way her Right legs wants to "buckle under her. We fixed the bed side commode for her to used.

## 2017-04-28 NOTE — Progress Notes (Signed)
Subjective: Patient reports patient doing well no leg pain improved back pain  Objective: Vital signs in last 24 hours: Temp:  [97 F (36.1 C)-98.8 F (37.1 C)] 98.8 F (37.1 C) (10/05 0655) Pulse Rate:  [45-75] 62 (10/05 0655) Resp:  [11-20] 18 (10/05 0655) BP: (102-124)/(59-69) 122/61 (10/05 0655) SpO2:  [93 %-100 %] 100 % (10/05 0655)  Intake/Output from previous day: 10/04 0701 - 10/05 0700 In: [place] [P.O.:240; I.V.:1600] Out: 1370 [Urine:1170; Blood:200] Intake/Output this shift: No intake/output data recorded.  strength out of 5 with some baseline right looks to me weakness  Lab Results:  Recent Labs  04/28/17 0527  WBC 14.0*  HGB 11.8*  HCT 36.3  PLT 229   BMET  Recent Labs  04/28/17 0527  NA 132*  K 3.8  CL 102  CO2 25  GLUCOSE 125*  BUN 7  CREATININE 0.81  CALCIUM 8.7*    Studies/Results: Dg Lumbar Spine 2-3 Views  Result Date: 04/27/2017 CLINICAL DATA:  Status post fusion L4 and L5 EXAM: DG C-ARM 61-120 MIN; LUMBAR SPINE - 2-3 VIEW COMPARISON:  Intraoperative images obtained earlier in the day FLUOROSCOPY TIME:  0 minutes 26 seconds; 2 acquired images FINDINGS: Frontal and lateral views were obtained. There is screw fixation posteriorly at L4 and L5 with pedicle screw tips in the respective vertebral bodies. Disc spacer noted at L4-5. Moderate disc space narrowing noted at L5-S1. No fracture or spondylolisthesis. IMPRESSION: Pedicle screws at L4 and L5 with screw tips in respective vertebral bodies. Disc spacer L4-5. Disc space narrowing L5-S1. No fracture or spondylolisthesis. Electronically Signed   By: Lowella Grip III M.D.   On: 04/27/2017 11:22   Dg Lumbar Spine 1 View  Result Date: 04/27/2017 CLINICAL DATA:  Intraoperative localization for lumbar fusion EXAM: LUMBAR SPINE - 1 VIEW COMPARISON:  11/03/2016 FINDINGS: Intraoperative localization is noted at L4-5 with surgical instrument along the posterior elements at that level. Surgical  retractors are seen as well as radiopaque sponges. The numbering nomenclature is similar to that utilized on prior CT examination. IMPRESSION: Intraoperative localization at L4-5. Electronically Signed   By: Inez Catalina M.D.   On: 04/27/2017 11:50   Dg C-arm 1-60 Min  Result Date: 04/27/2017 CLINICAL DATA:  Status post fusion L4 and L5 EXAM: DG C-ARM 61-120 MIN; LUMBAR SPINE - 2-3 VIEW COMPARISON:  Intraoperative images obtained earlier in the day FLUOROSCOPY TIME:  0 minutes 26 seconds; 2 acquired images FINDINGS: Frontal and lateral views were obtained. There is screw fixation posteriorly at L4 and L5 with pedicle screw tips in the respective vertebral bodies. Disc spacer noted at L4-5. Moderate disc space narrowing noted at L5-S1. No fracture or spondylolisthesis. IMPRESSION: Pedicle screws at L4 and L5 with screw tips in respective vertebral bodies. Disc spacer L4-5. Disc space narrowing L5-S1. No fracture or spondylolisthesis. Electronically Signed   By: Lowella Grip III M.D.   On: 04/27/2017 11:22    Assessment/Plan: Status post posterior lumbar interbody fusion postop day 1 doing well making normal and expected recovery. Continue physical occupational therapy  LOS: 1 day     Aanvi Voyles P 04/28/2017, 8:49 AM

## 2017-04-29 NOTE — Progress Notes (Signed)
Physical Therapy Treatment Patient Details Name: Penny Hart MRN: [id number] DOB: 10-Feb-1946 Today's Date: 04/29/2017    History of Present Illness Pt is a 71 y/o female s/p TLIF L4-L5 and decompressive laminotomies L3-S1. PMH including but not limited to CHF, COPD, HTN, and DM.    PT Comments    Pt remains very limited secondary to lethargy and R LE weakness. She continues to require two person physical assistance for transfers with blocking of R knee. Pt would continue to benefit from skilled physical therapy services at this time while admitted and after d/c to address the below listed limitations in order to improve overall safety and independence with functional mobility.    Follow Up Recommendations  CIR;Supervision/Assistance - 24 hour     Equipment Recommendations  None recommended by PT    Recommendations for Other Services Rehab consult     Precautions / Restrictions Precautions Precautions: Fall;Back Precaution Booklet Issued: No Precaution Comments: PT reviewed 3/3 back precautions with pt and family Required Braces or Orthoses: Spinal Brace Spinal Brace: Lumbar corset;Applied in sitting position Restrictions Weight Bearing Restrictions: No    Mobility  Bed Mobility Overal bed mobility: Needs Assistance Bed Mobility: Rolling;Sidelying to Sit Rolling: Min assist Sidelying to sit: Min assist       General bed mobility comments: increased time and effort, use of bed rails, cueing for log roll technique, assist with bilateral LEs and to elevate trunk  Transfers Overall transfer level: Needs assistance Equipment used: 2 person hand held assist Transfers: Sit to/from [facility] Sit to Stand: Mod assist;+2 physical assistance Stand pivot transfers: Max assist;+2 physical assistance       General transfer comment: increased time and effort, cueing for safe hand placement, physical assist of two to rise into full standing from EOB with  therapist blocking R knee, more assist needed with pivotal movement to chair positioned on pt's L side  Ambulation/Gait             General Gait Details: deferred at this time secondary to pt being lethargic, impaired sensation in R LE with R knee buckling in standing   Stairs            Wheelchair Mobility    Modified Rankin (Stroke Patients Only)       Balance Overall balance assessment: Needs assistance Sitting-balance support: Feet supported Sitting balance-Leahy Scale: Fair     Standing balance support: During functional activity;Bilateral upper extremity supported Standing balance-Leahy Scale: Poor Standing balance comment: reliant on UE supports and mod assist                            Cognition Arousal/Alertness: Lethargic Behavior During Therapy: WFL for tasks assessed/performed Overall Cognitive Status: Impaired/Different from baseline Area of Impairment: Attention;Memory;Following commands;Safety/judgement;Problem solving                   Current Attention Level: Sustained Memory: Decreased short-term memory;Decreased recall of precautions Following Commands: Follows one step commands consistently;Follows one step commands with increased time Safety/Judgement: Decreased awareness of safety   Problem Solving: Decreased initiation;Requires verbal cues;Requires tactile cues;Difficulty sequencing        Exercises      General Comments        Pertinent Vitals/Pain Pain Assessment: Faces Faces Pain Scale: No hurt    Home Living  Prior Function            PT Goals (current goals can now be found in the care plan section) Acute Rehab PT Goals PT Goal Formulation: With patient/family Time For Goal Achievement: 05/11/17 Potential to Achieve Goals: Good Progress towards PT goals: Progressing toward goals    Frequency    Min 5X/week      PT Plan Current plan remains appropriate     Co-evaluation              AM-PAC PT "6 Clicks" Daily Activity  Outcome Measure  Difficulty turning over in bed (including adjusting bedclothes, sheets and blankets)?: Unable Difficulty moving from lying on back to sitting on the side of the bed? : Unable Difficulty sitting down on and standing up from a chair with arms (e.g., wheelchair, bedside commode, etc,.)?: Unable Help needed moving to and from a bed to chair (including a wheelchair)?: A Lot Help needed walking in hospital room?: A Lot Help needed climbing 3-5 steps with a railing? : Total 6 Click Score: 8    End of Session Equipment Utilized During Treatment: Gait belt;Back brace Activity Tolerance: Patient limited by lethargy;Patient limited by fatigue Patient left: in chair;with call bell/phone within reach;with chair alarm set Nurse Communication: Mobility status;Need for lift equipment PT Visit Diagnosis: Other abnormalities of gait and mobility (R26.89);Unsteadiness on feet (R26.81)     Time: 4268-3419 PT Time Calculation (min) (ACUTE ONLY): 16 min  Charges:  $Therapeutic Activity: 8-22 mins                    G Codes:       [place], [place], [place] 04/29/2017, 12:12 PM

## 2017-04-29 NOTE — Progress Notes (Signed)
Patient with lower BP and sleepy most of the shift; she is now awake and alert; denies discomfort; denies any shortness of breath, denies any chest pain or feeling weak. Continue to monitor; patient has been out of bed to chair wearing her brace; still no ambulation due to right leg weakness; she did a transfer with therapy; she ate all of her lunch and is taking po's without difficulty.

## 2017-04-29 NOTE — Plan of Care (Signed)
Problem: Activity: Goal: Risk for activity intolerance will decrease Outcome: Not Progressing Patient unable to stand without assistance of at least 2. She states her R leg "just gives out".

## 2017-04-29 NOTE — Progress Notes (Signed)
Subjective: Patient reports overall patient improved reports less pain but still significantly limited by baseline right lower extremity weakness  Objective: Vital signs in last 24 hours: Temp:  [97.4 F (36.3 C)-99.9 F (37.7 C)] 99.9 F (37.7 C) (10/06 0500) Pulse Rate:  [61-66] 65 (10/06 0500) Resp:  [16-18] 18 (10/06 0500) BP: (104-125)/(46-72) 107/72 (10/06 0500) SpO2:  [86 %-100 %] 97 % (10/06 0500) Weight:  [86 kg (189 lb 9.6 oz)] 86 kg (189 lb 9.6 oz) (10/05 0921)  Intake/Output from previous day: 10/05 0701 - 10/06 0700 In: 375.7 [P.O.:360; I.V.:15.7] Out: 50 [Urine:50] Intake/Output this shift: No intake/output data recorded.  wound clean dry and intact baseline pain limited weakness right lower extremity no worse  Lab Results:  Recent Labs  04/28/17 0527  WBC 14.0*  HGB 11.8*  HCT 36.3  PLT 229   BMET  Recent Labs  04/28/17 0527  NA 132*  K 3.8  CL 102  CO2 25  GLUCOSE 125*  BUN 7  CREATININE 0.81  CALCIUM 8.7*    Studies/Results: Dg Lumbar Spine 2-3 Views  Result Date: 04/27/2017 CLINICAL DATA:  Status post fusion L4 and L5 EXAM: DG C-ARM 61-120 MIN; LUMBAR SPINE - 2-3 VIEW COMPARISON:  Intraoperative images obtained earlier in the day FLUOROSCOPY TIME:  0 minutes 26 seconds; 2 acquired images FINDINGS: Frontal and lateral views were obtained. There is screw fixation posteriorly at L4 and L5 with pedicle screw tips in the respective vertebral bodies. Disc spacer noted at L4-5. Moderate disc space narrowing noted at L5-S1. No fracture or spondylolisthesis. IMPRESSION: Pedicle screws at L4 and L5 with screw tips in respective vertebral bodies. Disc spacer L4-5. Disc space narrowing L5-S1. No fracture or spondylolisthesis. Electronically Signed   By: Lowella Grip III M.D.   On: 04/27/2017 11:22   Dg Lumbar Spine 1 View  Result Date: 04/27/2017 CLINICAL DATA:  Intraoperative localization for lumbar fusion EXAM: LUMBAR SPINE - 1 VIEW COMPARISON:   11/03/2016 FINDINGS: Intraoperative localization is noted at L4-5 with surgical instrument along the posterior elements at that level. Surgical retractors are seen as well as radiopaque sponges. The numbering nomenclature is similar to that utilized on prior CT examination. IMPRESSION: Intraoperative localization at L4-5. Electronically Signed   By: Inez Catalina M.D.   On: 04/27/2017 11:50   Dg C-arm 1-60 Min  Result Date: 04/27/2017 CLINICAL DATA:  Status post fusion L4 and L5 EXAM: DG C-ARM 61-120 MIN; LUMBAR SPINE - 2-3 VIEW COMPARISON:  Intraoperative images obtained earlier in the day FLUOROSCOPY TIME:  0 minutes 26 seconds; 2 acquired images FINDINGS: Frontal and lateral views were obtained. There is screw fixation posteriorly at L4 and L5 with pedicle screw tips in the respective vertebral bodies. Disc spacer noted at L4-5. Moderate disc space narrowing noted at L5-S1. No fracture or spondylolisthesis. IMPRESSION: Pedicle screws at L4 and L5 with screw tips in respective vertebral bodies. Disc spacer L4-5. Disc space narrowing L5-S1. No fracture or spondylolisthesis. Electronically Signed   By: Lowella Grip III M.D.   On: 04/27/2017 11:22    Assessment/Plan: Continue to mobilize with physical and occupational therapy patient may very well need a rehabilitation consult.  LOS: 2 days     Makena Mcgrady P 04/29/2017, 7:28 AM

## 2017-04-29 NOTE — Progress Notes (Signed)
Patient slept well throughout night, however she is quite unpredictable when standing or ambulating, stating her R leg is weak, and it buckles at times, without warning.

## 2017-04-30 NOTE — Progress Notes (Signed)
Penny Hart's daughter Clarene Critchley called to follow up on her mothers progression for the day. Staff assessed pt for level of consciousness with response of a/ox4, spontaneously arousal with voice and no complaint of pain. Patient in bed resting, states of being tired and wanting to sleep for the time being.

## 2017-04-30 NOTE — Progress Notes (Signed)
Neurosurgery Progress Note  Soft blood pressures yesterday. Asymptomatic. Discussed with nursing. Held narcotics and lasix and had improvement in BP. Stable neuro exam.  Consult was placed for CIR. Continue to work with therapy

## 2017-04-30 NOTE — Progress Notes (Signed)
Patients blood pressure continues to be soft, difficulty ambulating due to deconditioned right leg, consequently gait is unstable will attempt once more in AM. She is not compliant with twisting at her torso and continues to have pain relative to this.

## 2017-04-30 NOTE — Progress Notes (Signed)
Physical Therapy Treatment Patient Details Name: Penny Hart MRN: [id number] DOB: [birth year] Today's Date: 04/30/2017    History of Present Illness Pt is a 71 y/o female s/p TLIF L4-L5 and decompressive laminotomies L3-S1. PMH including but not limited to CHF, COPD, HTN, and DM.    PT Comments    Patient required +2 assist for functional transfers. Pt continues to be drowsy but willing to participate. Pt was able to perform pre gait activities however pt does continue to demonstrate R LE weakness and R knee buckling. Pt limited by pain. RN reported pt was up with nursing staff using [place] to get to the bathroom just prior to PT arrival.  Current plan remains appropriate.  Follow Up Recommendations  CIR;Supervision/Assistance - 24 hour     Equipment Recommendations  None recommended by PT    Recommendations for Other Services Rehab consult     Precautions / Restrictions Precautions Precautions: Fall;Back Precaution Booklet Issued: No Required Braces or Orthoses: Spinal Brace Spinal Brace: Lumbar corset;Applied in sitting position Restrictions Weight Bearing Restrictions: No    Mobility  Bed Mobility               General bed mobility comments: pt sitting in chair upon arrival  Transfers Overall transfer level: Needs assistance Equipment used: Rolling walker (2 wheeled) Transfers: Sit to/from Stand Sit to Stand: Mod assist;+2 physical assistance         General transfer comment: pt stood X2 with cues for technique and safe hand placement; pt able to stand without R knee blocked with assist to power up and maintain balance upon standing  Ambulation/Gait             General Gait Details: pt able to work on pre gait tasks with multimodal cues for R knee stabilization and for wieght shifting and posture; R knee buckling noted X2; pt limited by pain   Stairs            Wheelchair Mobility    Modified Rankin (Stroke Patients Only)        Balance Overall balance assessment: Needs assistance Sitting-balance support: Feet supported Sitting balance-Leahy Scale: Fair     Standing balance support: During functional activity;Bilateral upper extremity supported Standing balance-Leahy Scale: Poor                              Cognition Arousal/Alertness: Lethargic Behavior During Therapy: WFL for tasks assessed/performed Overall Cognitive Status: Impaired/Different from baseline Area of Impairment: Attention;Memory;Following commands;Problem solving                   Current Attention Level: Sustained Memory: Decreased short-term memory;Decreased recall of precautions Following Commands: Follows one step commands consistently;Follows one step commands with increased time     Problem Solving: Decreased initiation;Requires verbal cues;Requires tactile cues;Difficulty sequencing        Exercises      General Comments        Pertinent Vitals/Pain Pain Assessment: Faces Faces Pain Scale: Hurts even more Pain Location: back and buttocks Pain Descriptors / Indicators: Grimacing;Guarding;Sore;Spasm Pain Intervention(s): Limited activity within patient's tolerance;Monitored during session;Premedicated before session;Repositioned    Home Living                      Prior Function            PT Goals (current goals can now be found in the care plan section) Acute Rehab  PT Goals Patient Stated Goal: return to independence PT Goal Formulation: With patient/family Time For Goal Achievement: 05/11/17 Potential to Achieve Goals: Good Progress towards PT goals: Progressing toward goals    Frequency    Min 5X/week      PT Plan Current plan remains appropriate    Co-evaluation              AM-PAC PT "6 Clicks" Daily Activity  Outcome Measure  Difficulty turning over in bed (including adjusting bedclothes, sheets and blankets)?: Unable Difficulty moving from lying on back to  sitting on the side of the bed? : Unable Difficulty sitting down on and standing up from a chair with arms (e.g., wheelchair, bedside commode, etc,.)?: Unable Help needed moving to and from a bed to chair (including a wheelchair)?: A Lot Help needed walking in hospital room?: A Lot Help needed climbing 3-5 steps with a railing? : Total 6 Click Score: 8    End of Session Equipment Utilized During Treatment: Gait belt;Back brace Activity Tolerance: Patient limited by pain Patient left: in chair;with call bell/phone within reach;with chair alarm set Nurse Communication: Mobility status;Need for lift equipment PT Visit Diagnosis: Other abnormalities of gait and mobility (R26.89);Unsteadiness on feet (R26.81)     Time: 9798-9211 PT Time Calculation (min) (ACUTE ONLY): 18 min  Charges:  $Therapeutic Activity: 8-22 mins                    G Codes:       Earney Navy, PTA Pager: [phone]     Darliss Cheney 04/30/2017, 11:07 AM

## 2017-04-30 NOTE — Care Management Note (Addendum)
Case Management Note  Patient Details  Name: Penny Hart MRN: [id number] Date of Birth: Dec 20, 1945  Subjective/Objective:  MD has ordered CIR however pt is from "home alone", per previous CM assessment. Will alert CSW for alternative plan.                    Action/Plan: CM will follow closely for disposition/discharge needs.    Expected Discharge Date:                  Expected Discharge Plan:  [place]  In-House Referral:     Discharge planning Services  CM Consult  Post Acute Care Choice:    Choice offered to:     DME Arranged:    DME Agency:     HH Arranged:    [place] Agency:     Status of Service:  In process, will continue to follow  If discussed at Long Length of Stay Meetings, dates discussed:    Additional Comments:  Delrae Sawyers, RN 04/30/2017, 8:34 AM

## 2017-05-01 DIAGNOSIS — Z419 Encounter for procedure for purposes other than remedying health state, unspecified: Secondary | ICD-10-CM

## 2017-05-01 DIAGNOSIS — E119 Type 2 diabetes mellitus without complications: Secondary | ICD-10-CM

## 2017-05-01 DIAGNOSIS — M4316 Spondylolisthesis, lumbar region: Principal | ICD-10-CM

## 2017-05-01 DIAGNOSIS — J449 Chronic obstructive pulmonary disease, unspecified: Secondary | ICD-10-CM

## 2017-05-01 DIAGNOSIS — I5032 Chronic diastolic (congestive) heart failure: Secondary | ICD-10-CM

## 2017-05-01 DIAGNOSIS — G8918 Other acute postprocedural pain: Secondary | ICD-10-CM

## 2017-05-01 DIAGNOSIS — D72829 Elevated white blood cell count, unspecified: Secondary | ICD-10-CM

## 2017-05-01 DIAGNOSIS — I10 Essential (primary) hypertension: Secondary | ICD-10-CM

## 2017-05-01 DIAGNOSIS — E871 Hypo-osmolality and hyponatremia: Secondary | ICD-10-CM

## 2017-05-01 MED ORDER — HYDROMORPHONE HCL 2 MG PO TABS
2.0000 mg | ORAL_TABLET | ORAL | Status: DC | PRN
  Administered 2017-05-01 – 2017-05-03 (×6): 2 mg via ORAL
  Filled 2017-05-01 (×6): qty 1

## 2017-05-01 NOTE — Progress Notes (Signed)
Inpatient Rehabilitation  Met with patient to discuss team's recommendation for IP Rehab.  Shared booklets, insurance verification letter, and answered initial questions.  Plan to follow for timing of medical readiness, therapy tolerance, insurance authorization, and IP Rehab bed availability.  Please call with questions.    Carmelia Roller., CCC/SLP Admission Coordinator  [facility]  Cell [phone]

## 2017-05-01 NOTE — Progress Notes (Signed)
Physical Therapy Treatment Patient Details Name: Penny Hart MRN: [id number] DOB: June 22, 1946 Today's Date: 05/01/2017    History of Present Illness Pt is a 71 y/o female s/p TLIF L4-L5 and decompressive laminotomies L3-S1. PMH including but not limited to CHF, COPD, HTN, and DM.    PT Comments    Patient making modest progress towards mobility goals. Ambulated in room ~66ft with moderate assist and multi modal cues for RLE control. Tolerated Pre gait activities and functional task performance with assist. Will continue to see and progress as tolerated. Current POC remains appropriate.   Follow Up Recommendations  CIR;Supervision/Assistance - 24 hour     Equipment Recommendations  None recommended by PT    Recommendations for Other Services Rehab consult     Precautions / Restrictions Precautions Precautions: Fall;Back Precaution Booklet Issued: No Required Braces or Orthoses: Spinal Brace Spinal Brace: Lumbar corset;Applied in sitting position Restrictions Weight Bearing Restrictions: No    Mobility  Bed Mobility               General bed mobility comments: pt sitting in chair upon arrival  Transfers Overall transfer level: Needs assistance Equipment used: Rolling walker (2 wheeled) Transfers: Sit to/from Stand Sit to Stand: Mod assist;+2 physical assistance         General transfer comment: performed x4 during session, x2 from chair x2 from [facility]  Ambulation/Gait Ambulation/Gait assistance: Mod assist;+2 physical assistance Ambulation Distance (Feet): 10 Feet Assistive device: Rolling walker (2 wheeled) Gait Pattern/deviations: Decreased stance time - right;Decreased dorsiflexion - right (RLE buckling) Gait velocity: decreased Gait velocity interpretation: <1.8 ft/sec, indicative of risk for recurrent falls General Gait Details: patient with noted RLE buckling, cues for quad setting prior to taking on weight for limb advancement   Stairs            Wheelchair Mobility    Modified Rankin (Stroke Patients Only)       Balance Overall balance assessment: Needs assistance Sitting-balance support: Feet supported Sitting balance-Leahy Scale: Fair     Standing balance support: During functional activity;Bilateral upper extremity supported Standing balance-Leahy Scale: Poor                              Cognition Arousal/Alertness: Lethargic Behavior During Therapy: WFL for tasks assessed/performed Overall Cognitive Status: Impaired/Different from baseline Area of Impairment: Attention;Memory;Following commands;Problem solving                   Current Attention Level: Sustained Memory: Decreased short-term memory;Decreased recall of precautions Following Commands: Follows one step commands consistently;Follows one step commands with increased time     Problem Solving: Decreased initiation;Requires verbal cues;Requires tactile cues;Difficulty sequencing        Exercises Other Exercises Other Exercises: Pre gait weight shifts in standing with physical assist and RLE block out    General Comments        Pertinent Vitals/Pain Pain Assessment: Faces Faces Pain Scale: Hurts little more Pain Location: back and buttocks Pain Descriptors / Indicators: Grimacing;Guarding;Sore;Spasm Pain Intervention(s): Monitored during session    Home Living                      Prior Function            PT Goals (current goals can now be found in the care plan section) Acute Rehab PT Goals Patient Stated Goal: return to independence PT Goal Formulation: With patient/family Time For  Goal Achievement: 05/11/17 Potential to Achieve Goals: Good Progress towards PT goals: Progressing toward goals    Frequency    Min 5X/week      PT Plan Current plan remains appropriate    Co-evaluation              AM-PAC PT "6 Clicks" Daily Activity  Outcome Measure  Difficulty turning over in bed  (including adjusting bedclothes, sheets and blankets)?: Unable Difficulty moving from lying on back to sitting on the side of the bed? : Unable Difficulty sitting down on and standing up from a chair with arms (e.g., wheelchair, bedside commode, etc,.)?: Unable Help needed moving to and from a bed to chair (including a wheelchair)?: A Lot Help needed walking in hospital room?: A Lot Help needed climbing 3-5 steps with a railing? : Total 6 Click Score: 8    End of Session Equipment Utilized During Treatment: Gait belt;Back brace Activity Tolerance: Patient limited by pain Patient left: in chair;with call bell/phone within reach;with chair alarm set Nurse Communication: Mobility status;Need for lift equipment PT Visit Diagnosis: Other abnormalities of gait and mobility (R26.89);Unsteadiness on feet (R26.81)     Time: 4081-4481 PT Time Calculation (min) (ACUTE ONLY): 21 min  Charges:  $Therapeutic Activity: 8-22 mins                    G Codes:       Alben Deeds, PT DPT  Board Certified Neurologic Specialist [place] 05/01/2017, 1:34 PM

## 2017-05-01 NOTE — Progress Notes (Signed)
Patient still not able to ambulate without fear of right leg buckling, using steady in and out of bathroom. Daughter visited last night and was very supportive and felt if patient could get rehab at this hospital would be great.

## 2017-05-01 NOTE — Progress Notes (Signed)
Patient ID: Penny Hart, female   DOB: 06-02-1946, 71 y.o.   MRN: [id number] Subjective:  the patient is somnolent. She is  Arousable.She is in no apparent distress  Objective: Vital signs in last 24 hours: Temp:  [98.1 F (36.7 C)-99.5 F (37.5 C)] 99.5 F (37.5 C) (10/08 1103) Pulse Rate:  [57-75] 57 (10/08 1103) Resp:  [16-20] 20 (10/08 1103) BP: (110-144)/(55-71) 127/68 (10/08 1103) SpO2:  [94 %-100 %] 100 % (10/08 1103)  Intake/Output from previous day: 10/07 0701 - 10/08 0700 In: 123 [P.O.:120; I.V.:3] Out: -  Intake/Output this shift: No intake/output data recorded.  Physical exam the patient isomunolent but arousable.her strength is grossly normal on her bilateral gastrocnemius and quadriceps.  Lab Results: No results for input(s): WBC, HGB, HCT, PLT in the last 72 hours. BMET No results for input(s): NA, K, CL, CO2, GLUCOSE, BUN, CREATININE, CALCIUM in the last 72 hours.  Studies/Results: No results found.  Assessment/Plan: Postop day #4: I'll ask rehabilitation to see the patient. We will minimize her sedating medications and continue to encourage mobilization.  LOS: 4 days     Penny Hart D 05/01/2017, 11:13 AM

## 2017-05-01 NOTE — Consult Note (Signed)
Physical Medicine and Rehabilitation Consult Reason for Consult:decreased functional mobility Referring Physician: Dr. Arnoldo Morale   HPI: Penny Hart is a 71 y.o.right hand female with history of diastolic congestive heart failure, COPD, diabetes mellitus, hypertension. Per chart review and patient, patient lives alone independent prior to admission occasionally using a cane. One level home. Family in the area works. Presented 04/27/2017 with progressive low back pain. X-rays and imaging revealed L4 - 5 spondylolisthesis, L3-4, L4-5 and L5-S1 spinal stenosis radiculopathy compressing L4-5 and S1 nerve roots. Underwent L3-4 bilateral laminotomy foraminotomies, L4-5 and L5-S1 laminectomy foraminotomies to decompress bilateral L4, L5 and S1 nerve roots with posterior lumbar interbody fusion 04/27/2017 per Dr. Arnoldo Morale. Lumbar corset when out of bed applied in sitting position. Hospital course pain management. Physical therapy evaluation completed with recommendations of physical medicine rehabilitation consult.   Review of Systems  Constitutional: Negative for chills and fever.  HENT: Negative for hearing loss.   Eyes: Negative for blurred vision and double vision.  Respiratory: Positive for shortness of breath. Negative for cough.   Cardiovascular: Positive for leg swelling. Negative for chest pain and palpitations.  Gastrointestinal: Positive for constipation. Negative for nausea and vomiting.       GERD  Genitourinary: Negative for flank pain and hematuria.  Musculoskeletal: Positive for back pain and myalgias.  Skin: Negative for rash.  Neurological: Positive for weakness.  All other systems reviewed and are negative.  Past Medical History:  Diagnosis Date  . Arthritis   . CHF (congestive heart failure) ([place])   . COPD (chronic obstructive pulmonary disease) ([place])   . Diabetes mellitus without complication ([place])   . GERD (gastroesophageal reflux disease)   . Glaucoma   . Hiatal  hernia   . Hyperlipidemia   . Hypertension   . Neuropathy   . Sleep apnea   . Spondylolisthesis of lumbar region    Past Surgical History:  Procedure Laterality Date  . ABDOMINAL HYSTERECTOMY    . COLONOSCOPY WITH PROPOFOL N/A 05/30/2015   Procedure: COLONOSCOPY WITH PROPOFOL;  Surgeon: Lucilla Lame, MD;  Location: [facility] ENDOSCOPY;  Service: Endoscopy;  Laterality: N/A;  . ESOPHAGOGASTRODUODENOSCOPY (EGD) WITH PROPOFOL N/A 05/30/2015   Procedure: ESOPHAGOGASTRODUODENOSCOPY (EGD) WITH PROPOFOL;  Surgeon: Lucilla Lame, MD;  Location: [facility] ENDOSCOPY;  Service: Endoscopy;  Laterality: N/A;  . EYE SURGERY     Bialteral for glaucoma  . Eye surgery for glaucoma    . JOINT REPLACEMENT     Left  . Left total knee replacement    . OOPHORECTOMY    . POSTERIOR LUMBAR FUSION  04/27/2017  . TUBAL LIGATION     Family History  Problem Relation Age of Onset  . Diabetes Mother    Social History:  reports that she has quit smoking. She has never used smokeless tobacco. She reports that she does not drink alcohol or use drugs. Allergies:  Allergies  Allergen Reactions  . Oxycodone-Acetaminophen     UNSPECIFIED REACTION   . Tramadol Rash and Other (See Comments)    INTOLERANCE > Chest pain   Medications Prior to Admission  Medication Sig Dispense Refill  . amLODipine (NORVASC) 5 MG tablet Take 1 tablet by mouth daily.    . cholecalciferol (VITAMIN D) 1000 units tablet Take 1,000 Units by mouth daily.    . furosemide (LASIX) 20 MG tablet Take 1 tablet by mouth daily.    Marland Kitchen gabapentin (NEURONTIN) 100 MG capsule Take 200 mg by mouth 3 (three) times daily.     Marland Kitchen  hydrocortisone (ANUSOL-HC) 25 MG suppository Place 25 mg rectally 2 (two) times daily as needed for hemorrhoids or itching.    . metoprolol tartrate (LOPRESSOR) 25 MG tablet Take 12.5 mg by mouth 2 (two) times daily.     Marland Kitchen omeprazole (PRILOSEC) 20 MG capsule Take 20 mg by mouth daily as needed.    . potassium chloride SA (K-DUR,KLOR-CON) 20  MEQ tablet Take 1 tablet by mouth daily.    . pravastatin (PRAVACHOL) 10 MG tablet Take 1 tablet by mouth every evening.    . venlafaxine XR (EFFEXOR-XR) 75 MG 24 hr capsule Take 75 mg by mouth daily with breakfast.      Home: Home Living Family/patient expects to be discharged to:: Private residence Living Arrangements: Alone Available Help at Discharge: Family, Available PRN/intermittently Type of Home: House Home Access: Level entry Home Layout: One level Home Equipment: Environmental consultant - 2 wheels  Functional History: Prior Function Level of Independence: Independent Functional Status:  Mobility: Bed Mobility Overal bed mobility: Needs Assistance Bed Mobility: Rolling, Sidelying to Sit Rolling: Min assist Sidelying to sit: Min assist Sit to sidelying: Min assist General bed mobility comments: pt sitting in chair upon arrival Transfers Overall transfer level: Needs assistance Equipment used: Rolling walker (2 wheeled) Transfer via Lift Equipment: Stedy Transfers: Sit to/from Stand Sit to Stand: Mod assist, +2 physical assistance Stand pivot transfers: Max assist, +2 physical assistance General transfer comment: pt stood X2 with cues for technique and safe hand placement; pt able to stand without R knee blocked with assist to power up and maintain balance upon standing Ambulation/Gait General Gait Details: pt able to work on pre gait tasks with multimodal cues for R knee stabilization and for wieght shifting and posture; R knee buckling noted X2; pt limited by pain    ADL:    Cognition: Cognition Overall Cognitive Status: Impaired/Different from baseline Orientation Level: Oriented X4 Cognition Arousal/Alertness: Lethargic Behavior During Therapy: WFL for tasks assessed/performed Overall Cognitive Status: Impaired/Different from baseline Area of Impairment: Attention, Memory, Following commands, Problem solving Current Attention Level: Sustained Memory: Decreased short-term  memory, Decreased recall of precautions Following Commands: Follows one step commands consistently, Follows one step commands with increased time Safety/Judgement: Decreased awareness of safety Problem Solving: Decreased initiation, Requires verbal cues, Requires tactile cues, Difficulty sequencing  Blood pressure 118/62, pulse 72, temperature 98.8 F (37.1 C), temperature source Oral, resp. rate 18, height 5\' 3"  (1.6 m), weight 86 kg (189 lb 9.6 oz), SpO2 96 %. Physical Exam  Vitals reviewed. Constitutional: She is oriented to person, place, and time. She appears well-developed and well-nourished. She appears distressed.  HENT:  Head: Normocephalic and atraumatic.  Eyes: EOM are normal. Right eye exhibits no discharge. Left eye exhibits no discharge.  Neck: Normal range of motion. Neck supple. No thyromegaly present.  Cardiovascular: Normal rate, regular rhythm and normal heart sounds.   Respiratory: Effort normal and breath sounds normal. No respiratory distress.  GI: Soft. Bowel sounds are normal. She exhibits no distension.  Musculoskeletal:  +Edema, no tenderness in extremities  Neurological: She is alert and oriented to person, place, and time.  Motor: B/l UE 5/5 proximal to distal RLE: HF 2+/5, KE 3/5, ADF/PF 4+/5 LLE: HF, KE 4-/5, ADF/PF 5/5 Sensation intact to light touch  Skin: Skin is warm and dry.  Back incision is dressed  Psychiatric: Her affect is blunt. She is slowed.    No results found for this or any previous visit (from the past 24 hour(s)). No results  found.  Assessment/Plan: Diagnosis: L4- S1 Radiculopathy s/p decompression Labs independently reviewed.  Records reviewed and summated above.  1. Does the need for close, 24 hr/day medical supervision in concert with the patient's rehab needs make it unreasonable for this patient to be served in a less intensive setting? Potentially 2. Co-Morbidities requiring supervision/potential complications: diastolic  congestive heart failure (monitor for signs/symptoms of fluid overload), COPD (monitor O2 Sats and RR with increased mobility), diabetes mellitus (Monitor in accordance with exercise and adjust meds as necessary), HTN (monitor and provide prns in accordance with increased physical exertion and pain), post-op pain management (Biofeedback training with therapies to help reduce reliance on opiate pain medications, monitor pain control during therapies, and sedation at rest and titrate to maximum efficacy to ensure participation and gains in therapies), hyponatremia (cont to monitor, treat if necessary), leukocytosis (cont to monitor for signs and symptoms of infection, further workup if indicated) 3. Due to bladder management, bowel management, safety, skin/wound care, disease management, pain management and patient education, does the patient require 24 hr/day rehab nursing? Yes 4. Does the patient require coordinated care of a physician, rehab nurse, PT (1-2 hrs/day, 5 days/week) and OT (1-2 hrs/day, 5 days/week) to address physical and functional deficits in the context of the above medical diagnosis(es)? Yes Addressing deficits in the following areas: balance, endurance, locomotion, strength, transferring, bowel/bladder control, bathing, dressing, toileting and psychosocial support 5. Can the patient actively participate in an intensive therapy program of at least 3 hrs of therapy per day at least 5 days per week? No 6. The potential for patient to make measurable gains while on inpatient rehab is excellent 7. Anticipated functional outcomes upon discharge from inpatient rehab are min assist  with PT, min assist with OT, n/a with SLP. 8. Estimated rehab length of stay to reach the above functional goals is: 18-22 days. 9. Anticipated D/C setting: Other 10. Anticipated post D/C treatments: SNF 11. Overall Rehab/Functional Prognosis: excellent  RECOMMENDATIONS: This patient's condition is appropriate for  continued rehabilitative care in the following setting: Likely CIR when patient able to tolerate 3 hours of therapy/day. Patient has agreed to participate in recommended program. Potentially Note that insurance prior authorization may be required for reimbursement for recommended care.  Comment: Rehab Admissions Coordinator to follow up.  Delice Lesch, MD, ABPMR Lauraine Rinne J., PA-C 05/01/2017

## 2017-05-01 NOTE — Care Management Important Message (Signed)
Important Message  Patient Details  Name: Penny Hart MRN: [id number] Date of Birth: 05-10-1946   Medicare Important Message Given:  Yes    Nathen May 05/01/2017, 8:41 AM

## 2017-05-01 NOTE — Progress Notes (Signed)
Penny Hart showing increased drowsiness in between activities. Patients daughter Penny Hart called asking that her mother is given pain medications. Daughter was reassured that her mother is given pain medication when patient verbalizes or show signs and symptoms of pain. Doctor paged for pain medication adjustment.

## 2017-05-02 MED ORDER — DOCUSATE SODIUM 100 MG PO CAPS: 100.0000 mg | ORAL_CAPSULE | Freq: Two times a day (BID) | ORAL | 0 refills | Status: DC

## 2017-05-02 MED ORDER — HYDROMORPHONE HCL 2 MG PO TABS: 2.0000 mg | ORAL_TABLET | ORAL | 0 refills | Status: DC | PRN

## 2017-05-02 NOTE — Progress Notes (Addendum)
Inpatient Rehabilitation  Note that patient has been discharged today, we are likely unable to get insurance authorization same day for an IP Rehab admission, and have limited bed availability at this time.  Discussed with nurse case manager.  Will follow incase patient remains in house and we have a bed that opens. Call if questions.    Carmelia Roller., CCC/SLP Admission Coordinator  [place]  Cell [phone]

## 2017-05-02 NOTE — Progress Notes (Signed)
Physical Therapy Treatment Patient Details Name: Penny Hart MRN: [id number] DOB: 1945-11-30 Today's Date: 05/02/2017    History of Present Illness Pt is a 71 y/o female s/p TLIF L4-L5 and decompressive laminotomies L3-S1. PMH including but not limited to CHF, COPD, HTN, and DM.    PT Comments    Patient seen for mobility progression, was able to tolerate ambulation with RW and 1 person assist today. Still remains limited by fatigue and pain. Will follow.   Follow Up Recommendations  CIR;Supervision/Assistance - 24 hour     Equipment Recommendations  None recommended by PT    Recommendations for Other Services Rehab consult     Precautions / Restrictions Precautions Precautions: Fall;Back Precaution Booklet Issued: No Required Braces or Orthoses: Spinal Brace Spinal Brace: Lumbar corset;Applied in sitting position Restrictions Weight Bearing Restrictions: No    Mobility  Bed Mobility               General bed mobility comments: pt sitting in chair upon arrival  Transfers Overall transfer level: Needs assistance Equipment used: Rolling walker (2 wheeled) Transfers: Sit to/from Stand Sit to Stand: Min assist         General transfer comment: Min assist to power up to standing with modified hand placement and multi modal cues  Ambulation/Gait Ambulation/Gait assistance: Min assist Ambulation Distance (Feet): 50 Feet Assistive device: Rolling walker (2 wheeled) Gait Pattern/deviations: Decreased stance time - right;Decreased dorsiflexion - right Gait velocity: decreased   General Gait Details: Continues to demonstrate RLE weakness which worsens with fatigue, cues for setting of RLE remain a necessity   Stairs            Wheelchair Mobility    Modified Rankin (Stroke Patients Only)       Balance Overall balance assessment: Needs assistance Sitting-balance support: Feet supported Sitting balance-Leahy Scale: Fair     Standing balance  support: During functional activity;Bilateral upper extremity supported Standing balance-Leahy Scale: Poor Standing balance comment: heavy reliance on UE support                            Cognition Arousal/Alertness: Awake/alert Behavior During Therapy: WFL for tasks assessed/performed Overall Cognitive Status: Within Functional Limits for tasks assessed                                 General Comments: improved cognition today      Exercises      General Comments        Pertinent Vitals/Pain Pain Assessment: Faces Faces Pain Scale: Hurts little more Pain Location: back and right LE Pain Descriptors / Indicators: Grimacing;Guarding;Sore;Spasm Pain Intervention(s): Monitored during session    Home Living                      Prior Function            PT Goals (current goals can now be found in the care plan section) Acute Rehab PT Goals Patient Stated Goal: return to independence PT Goal Formulation: With patient/family Time For Goal Achievement: 05/11/17 Potential to Achieve Goals: Good Progress towards PT goals: Progressing toward goals    Frequency    Min 5X/week      PT Plan Current plan remains appropriate    Co-evaluation              AM-PAC PT "6 Clicks" Daily  Activity  Outcome Measure  Difficulty turning over in bed (including adjusting bedclothes, sheets and blankets)?: Unable Difficulty moving from lying on back to sitting on the side of the bed? : Unable Difficulty sitting down on and standing up from a chair with arms (e.g., wheelchair, bedside commode, etc,.)?: Unable Help needed moving to and from a bed to chair (including a wheelchair)?: A Lot Help needed walking in hospital room?: A Lot Help needed climbing 3-5 steps with a railing? : Total 6 Click Score: 8    End of Session Equipment Utilized During Treatment: Gait belt;Back brace Activity Tolerance: Patient limited by pain Patient left: in  chair;with call bell/phone within reach;with chair alarm set Nurse Communication: Mobility status;Need for lift equipment PT Visit Diagnosis: Other abnormalities of gait and mobility (R26.89);Unsteadiness on feet (R26.81)     Time: 0488-8916 PT Time Calculation (min) (ACUTE ONLY): 18 min  Charges:  $Gait Training: 8-22 mins                    G Codes:       Alben Deeds, PT DPT  Board Certified Neurologic Specialist [place] 05/02/2017, 1:40 PM

## 2017-05-02 NOTE — Discharge Summary (Addendum)
Physician Discharge Summary  Patient ID: Penny Hart MRN: [id number] DOB/AGE: 69-Apr-1947 69 y.o.  Admit date: 04/27/2017 Discharge date: 05/03/2017  Admission Diagnoses:lumbar spinal stenosis, lumbar spondylolisthesis, lumbago, lumbar radiculopathy, neurogenic claudication  Discharge Diagnoses: the same Active Problems:   Spondylolisthesis of lumbar region   Surgery, elective   Chronic diastolic congestive heart failure ([facility])   Chronic obstructive pulmonary disease ([place])   Diabetes mellitus type 2 in nonobese ([place])   Benign essential HTN   Post-operative pain   Hyponatremia   Leukocytosis   Discharged Condition: good  Hospital Course: I performed a lumbar compression, instrumentation and fusion on the patient on 04/27/2017. The surgery went well.  The patient's postoperative course was only remarkable for her being slow to mobilize. He was a bit somnolent at first. Her sedating medications were minimized.The patient was seen by physical therapy and rehabilitation. Arrangements were made for her to be transferred to the rehabilitation unit.  Consults: physical therapy, rehabilitation Significant Diagnostic Studies:none Treatments:L4-5 decompression, instrumentation and fusion, L3-4 and L5-S1 laminectomy/laminotomy/foraminotomy Discharge Exam: Blood pressure (!) 113/58, pulse 72, temperature 98.1 F (36.7 C), temperature source Oral, resp. rate 18, height 5\' 3"  (1.6 m), weight 86 kg (189 lb 9.6 oz), SpO2 93 %. The patient is alert and pleasant. Her strengths normal in her lower extremities.  Disposition: rehabilitation  Discharge Instructions    Call MD for:  difficulty breathing, headache or visual disturbances    Complete by:  As directed    Call MD for:  extreme fatigue    Complete by:  As directed    Call MD for:  hives    Complete by:  As directed    Call MD for:  persistant dizziness or light-headedness    Complete by:  As directed    Call MD for:  persistant  nausea and vomiting    Complete by:  As directed    Call MD for:  redness, tenderness, or signs of infection (pain, swelling, redness, odor or green/yellow discharge around incision site)    Complete by:  As directed    Call MD for:  severe uncontrolled pain    Complete by:  As directed    Call MD for:  temperature >100.4    Complete by:  As directed    Diet - low sodium heart healthy    Complete by:  As directed    Discharge instructions    Complete by:  As directed    Call [phone] for a followup appointment. Take a stool softener while you are using pain medications.   Driving Restrictions    Complete by:  As directed    Do not drive for 2 weeks.   Increase activity slowly    Complete by:  As directed    Lifting restrictions    Complete by:  As directed    Do not lift more than 5 pounds. No excessive bending or twisting.   May shower / Bathe    Complete by:  As directed    He may shower after the pain she is removed 3 days after surgery. Leave the incision alone.   No dressing needed    Complete by:  As directed      Allergies as of 05/03/2017      Reactions   Oxycodone-acetaminophen    UNSPECIFIED REACTION    Tramadol Rash, Other (See Comments)   INTOLERANCE > Chest pain      Medication List    TAKE these medications  amLODipine 5 MG tablet Commonly known as:  NORVASC Take 1 tablet by mouth daily.   cholecalciferol 1000 units tablet Commonly known as:  VITAMIN D Take 1,000 Units by mouth daily.   docusate sodium 100 MG capsule Commonly known as:  COLACE Take 1 capsule (100 mg total) by mouth 2 (two) times daily.   furosemide 20 MG tablet Commonly known as:  LASIX Take 1 tablet by mouth daily.   gabapentin 100 MG capsule Commonly known as:  NEURONTIN Take 200 mg by mouth 3 (three) times daily.   hydrocortisone 25 MG suppository Commonly known as:  ANUSOL-HC Place 25 mg rectally 2 (two) times daily as needed for hemorrhoids or itching.    HYDROmorphone 4 MG tablet Commonly known as:  DILAUDID Take 1 tablet (4 mg total) by mouth every 4 (four) hours as needed for severe pain.   metoprolol tartrate 25 MG tablet Commonly known as:  LOPRESSOR Take 12.5 mg by mouth 2 (two) times daily.   omeprazole 20 MG capsule Commonly known as:  PRILOSEC Take 20 mg by mouth daily as needed.   potassium chloride SA 20 MEQ tablet Commonly known as:  K-DUR,KLOR-CON Take 1 tablet by mouth daily.   pravastatin 10 MG tablet Commonly known as:  PRAVACHOL Take 1 tablet by mouth every evening.   venlafaxine XR 75 MG 24 hr capsule Commonly known as:  EFFEXOR-XR Take 75 mg by mouth daily with breakfast.      Contact information for after-discharge care    Destination    [facility] .   Specialty:  [place] information: [address] [place] [phone]              Signed: Traci Sermon 05/03/2017, 1:57 PM

## 2017-05-02 NOTE — Clinical Social Work Note (Signed)
Clinical Social Work Assessment  Patient Details  Name: Penny Hart MRN: [id number] Date of Birth: 10-13-1945  Date of referral:  05/02/17               Reason for consult:  Facility Placement                Permission sought to share information with:  Facility Sport and exercise psychologist, Family Supports Permission granted to share information::  Yes, Verbal Permission Granted  Name::     Armed forces training and education officer::  SNF  Relationship::  Daughter  Contact Information:     Housing/Transportation Living arrangements for the past 2 months:  Single Family Home Source of Information:  Patient Patient Interpreter Needed:  None Criminal Activity/Legal Involvement Pertinent to Current Situation/Hospitalization:  No - Comment as needed Significant Relationships:  Adult Children, Other Family Members Lives with:  Self Do you feel safe going back to the place where you live?  Yes Need for family participation in patient care:  No (Coment)  Care giving concerns:  Patient currently lives at home alone but will benefit from receiving short term rehab prior to discharging back home in order to adequately care for herself independently.   Social Worker assessment / plan:  CSW met with patient and discussed recommendation for SNF. CSW provided bed offers. CSW will follow back up with patient after she speaks with her daughter to determine facility preference and initiate insurance authorization with [employer].  Employment status:  Retired Nurse, adult PT Recommendations:  [place] / Referral to community resources:  [place]  Patient/Family's Response to care:  Patient is agreeable to SNF, and would like to be close to [place].  Patient/Family's Understanding of and Emotional Response to Diagnosis, Current Treatment, and Prognosis:  Patient indicated understanding of the benefit of receiving rehab prior to going home, and  indicated that she wanted to make sure she was able to pick a good place that would take good care of her. Patient requested ability to talk with her daughter over the phone to help with facility preference. Patient indicated understanding of CSW role and appreciated assistance.  Emotional Assessment Appearance:  Appears stated age Attitude/Demeanor/Rapport:    Affect (typically observed):  Appropriate Orientation:  Oriented to Self, Oriented to Place, Oriented to  Time, Oriented to Situation Alcohol / Substance use:  Not Applicable Psych involvement (Current and /or in the community):  No (Comment)  Discharge Needs  Concerns to be addressed:  Care Coordination Readmission within the last 30 days:  No Current discharge risk:  Lives alone, Physical Impairment Barriers to Discharge:  Continued Medical Work up, [place], [place] 05/02/2017, 12:42 PM

## 2017-05-02 NOTE — NC FL2 (Signed)
[place] MEDICAID FL2 LEVEL OF CARE SCREENING TOOL     IDENTIFICATION  Patient Name: Penny Hart Birthdate: 06-02-1946 Sex: female Admission Date (Current Location): 04/27/2017  [facility] and [place] Number:  Engineering geologist and Address:  [facility], [place] [address], [place], [place] [address]      Provider Number: [id number]  Attending Physician Name and Address:  Newman Pies, MD  Relative Name and Phone Number:       Current Level of Care: Hospital Recommended Level of Care: [place] Prior Approval Number:    Date Approved/Denied:   PASRR Number: [id number]  Discharge Plan: SNF    Current Diagnoses: Patient Active Problem List   Diagnosis Date Noted  . Surgery, elective   . Chronic diastolic congestive heart failure ([place])   . Chronic obstructive pulmonary disease ([place])   . Diabetes mellitus type 2 in nonobese (HCC)   . Benign essential HTN   . Post-operative pain   . Hyponatremia   . Leukocytosis   . Spondylolisthesis of lumbar region 04/27/2017  . Blood in stool   . Hiatal hernia   . Benign neoplasm of cecum   . Benign neoplasm of transverse colon   . Diverticulosis of large intestine without diverticulitis   . Rectal bleeding 05/29/2015  . Non-intractable cyclical vomiting with nausea     Orientation RESPIRATION BLADDER Height & Weight     Self, Time, Situation, Place  Normal Continent Weight: 189 lb 9.6 oz (86 kg) Height:  5\' 3"  (160 cm)  BEHAVIORAL SYMPTOMS/MOOD NEUROLOGICAL BOWEL NUTRITION STATUS      Continent    AMBULATORY STATUS COMMUNICATION OF NEEDS Skin   Limited Assist Verbally Surgical wounds (closed back incision 04/27/17, adhesive bandage and gauze dressing)                       Personal Care Assistance Level of Assistance  Bathing, Dressing Bathing Assistance: Limited assistance   Dressing Assistance: Limited assistance     Functional Limitations Info              [place]  PT (By licensed PT), OT (By licensed OT)     PT Frequency: 5x/wk OT Frequency: 5x/wk            Contractures      Additional Factors Info  Code Status, Allergies, Psychotropic Code Status Info: full Allergies Info: Oxycodone-acetaminophen, Tramadol Psychotropic Info: Effexor XR 75mg , daily with breakfast         Current Medications (05/02/2017):  This is the current hospital active medication list Current Facility-Administered Medications  Medication Dose Route Frequency Provider Last Rate Last Dose  . 0.9 %  sodium chloride infusion  250 mL Intravenous Continuous Newman Pies, MD   Stopped at 04/28/17 1000  . acetaminophen (TYLENOL) tablet 650 mg  650 mg Oral Q4H PRN Newman Pies, MD   650 mg at 05/02/17 2297   Or  . acetaminophen (TYLENOL) suppository 650 mg  650 mg Rectal Q4H PRN Newman Pies, MD      . amLODipine (NORVASC) tablet 5 mg  5 mg Oral Daily Newman Pies, MD   5 mg at 05/02/17 0915  . bisacodyl (DULCOLAX) suppository 10 mg  10 mg Rectal Daily PRN Newman Pies, MD      . cholecalciferol (VITAMIN D) tablet 1,000 Units  1,000 Units Oral Daily Newman Pies, MD   1,000 Units at 05/02/17 0914  . docusate sodium (  COLACE) capsule 100 mg  100 mg Oral BID Newman Pies, MD   100 mg at 05/02/17 0920  . furosemide (LASIX) tablet 20 mg  20 mg Oral Daily Newman Pies, MD   20 mg at 05/02/17 0915  . gabapentin (NEURONTIN) capsule 200 mg  200 mg Oral TID Newman Pies, MD   200 mg at 05/02/17 0914  . HYDROmorphone (DILAUDID) tablet 2 mg  2 mg Oral Q4H PRN Newman Pies, MD   2 mg at 05/01/17 2202  . menthol-cetylpyridinium (CEPACOL) lozenge 3 mg  1 lozenge Oral PRN Newman Pies, MD       Or  . phenol [facility]) mouth spray 1 spray  1 spray Mouth/Throat PRN Newman Pies, MD      . metoprolol tartrate (LOPRESSOR) tablet 12.5 mg  12.5 mg Oral BID Newman Pies, MD   12.5 mg at 05/02/17 0914  .  ondansetron (ZOFRAN) tablet 4 mg  4 mg Oral Q6H PRN Newman Pies, MD       Or  . ondansetron [facility]) injection 4 mg  4 mg Intravenous Q6H PRN Newman Pies, MD      . pantoprazole (PROTONIX) EC tablet 40 mg  40 mg Oral Daily Newman Pies, MD   40 mg at 05/02/17 0915  . potassium chloride SA (K-DUR,KLOR-CON) CR tablet 20 mEq  20 mEq Oral Daily Newman Pies, MD   20 mEq at 05/02/17 1000  . pravastatin (PRAVACHOL) tablet 10 mg  10 mg Oral QPM Newman Pies, MD   10 mg at 05/01/17 2000  . sodium chloride flush (NS) 0.9 % injection 3 mL  3 mL Intravenous Q12H Newman Pies, MD   3 mL at 05/01/17 2200  . sodium chloride flush (NS) 0.9 % injection 3 mL  3 mL Intravenous PRN Newman Pies, MD      . venlafaxine XR (EFFEXOR-XR) 24 hr capsule 75 mg  75 mg Oral Q breakfast Newman Pies, MD   75 mg at 05/02/17 6294     Discharge Medications: Please see discharge summary for a list of discharge medications.  Relevant Imaging Results:  Relevant Lab Results:   Additional Information SS#: [id number]  Geralynn Ochs, LCSW

## 2017-05-02 NOTE — Progress Notes (Signed)
CSW received call from clinical nurse through [facility] to discuss the authorization request received for SNF placement. Clinical nurse indicated that authorization would not be received until tomorrow. CSW contacted facility to discuss if they would accept the patient under LOG pending insurance authorization, and they will not; patient will not be able to admit to facility until authorization has been received.  CSW to follow up with information when received tomorrow.  Laveda Abbe, [place] Clinical Social Worker [phone]

## 2017-05-02 NOTE — Progress Notes (Signed)
Patient in bed this evening. Walked to and from the bathroom several times. Requested and received pain meds several times through the shift. Will continue to monitor

## 2017-05-03 ENCOUNTER — Encounter
Admission: RE | Admit: 2017-05-03 | Discharge: 2017-05-03 | Disposition: A | Discharge status: Home / Self Care | Payer: Medicare HMO | Source: Ambulatory Visit | Attending: Internal Medicine | Admitting: Internal Medicine

## 2017-05-03 DIAGNOSIS — M4800 Spinal stenosis, site unspecified: Secondary | ICD-10-CM | POA: Diagnosis not present

## 2017-05-03 DIAGNOSIS — M549 Dorsalgia, unspecified: Secondary | ICD-10-CM | POA: Diagnosis not present

## 2017-05-03 DIAGNOSIS — I25119 Atherosclerotic heart disease of native coronary artery with unspecified angina pectoris: Secondary | ICD-10-CM | POA: Diagnosis not present

## 2017-05-03 DIAGNOSIS — Z4789 Encounter for other orthopedic aftercare: Secondary | ICD-10-CM | POA: Diagnosis not present

## 2017-05-03 DIAGNOSIS — J42 Unspecified chronic bronchitis: Secondary | ICD-10-CM | POA: Diagnosis not present

## 2017-05-03 DIAGNOSIS — Z23 Encounter for immunization: Secondary | ICD-10-CM | POA: Diagnosis not present

## 2017-05-03 DIAGNOSIS — R2689 Other abnormalities of gait and mobility: Secondary | ICD-10-CM | POA: Diagnosis not present

## 2017-05-03 DIAGNOSIS — M4316 Spondylolisthesis, lumbar region: Secondary | ICD-10-CM | POA: Diagnosis not present

## 2017-05-03 DIAGNOSIS — E1140 Type 2 diabetes mellitus with diabetic neuropathy, unspecified: Secondary | ICD-10-CM | POA: Diagnosis not present

## 2017-05-03 DIAGNOSIS — I110 Hypertensive heart disease with heart failure: Secondary | ICD-10-CM | POA: Diagnosis not present

## 2017-05-03 DIAGNOSIS — M48062 Spinal stenosis, lumbar region with neurogenic claudication: Secondary | ICD-10-CM | POA: Diagnosis not present

## 2017-05-03 DIAGNOSIS — R41841 Cognitive communication deficit: Secondary | ICD-10-CM | POA: Diagnosis not present

## 2017-05-03 DIAGNOSIS — O3430 Maternal care for cervical incompetence, unspecified trimester: Secondary | ICD-10-CM | POA: Diagnosis not present

## 2017-05-03 DIAGNOSIS — M6281 Muscle weakness (generalized): Secondary | ICD-10-CM | POA: Diagnosis not present

## 2017-05-03 MED ORDER — HYDROMORPHONE HCL 4 MG PO TABS: 4.0000 mg | ORAL_TABLET | ORAL | 0 refills | Status: DC | PRN

## 2017-05-03 MED ORDER — HYDROMORPHONE HCL 2 MG PO TABS
4.0000 mg | ORAL_TABLET | ORAL | Status: DC | PRN
  Administered 2017-05-03: 4 mg via ORAL
  Filled 2017-05-03: qty 2

## 2017-05-03 NOTE — Progress Notes (Signed)
Inpatient Rehabilitation  I have no bed available to offer today; note that SNF plans are in process.  Will sign off at this time.  Call if questions.  Carmelia Roller., CCC/SLP Admission Coordinator  [place]  Cell [phone]

## 2017-05-03 NOTE — Progress Notes (Signed)
Report called in to nurse Dawn at [facility].

## 2017-05-03 NOTE — Care Management Note (Signed)
Case Management Note  Patient Details  Name: Penny Hart MRN: [id number] Date of Birth: 1945/10/27  Subjective/Objective:                    Action/Plan: Pt discharging to [facility]. No further needs per CM.  Expected Discharge Date:  05/02/17               Expected Discharge Plan:  IP Rehab Facility  In-House Referral:  Clinical Social Work  Discharge planning Services  CM Consult  Post Acute Care Choice:    Choice offered to:     DME Arranged:    DME Agency:     HH Arranged:    [place] Agency:     Status of Service:  Completed, signed off  If discussed at [employer], dates discussed:    Additional Comments:  Pollie Friar, RN 05/03/2017, 2:56 PM

## 2017-05-03 NOTE — Progress Notes (Signed)
Physical Therapy Treatment Patient Details Name: Penny Hart MRN: [id number] DOB: [birth year] Today's Date: 05/03/2017    History of Present Illness Pt is a 71 y/o female s/p TLIF L4-L5 and decompressive laminotomies L3-S1. PMH including but not limited to CHF, COPD, HTN, and DM.    PT Comments    Patient seen for mobility progression. Limited by pain today but able to mobilize in room. Performed some aspects of bed mobility but did require cues for technique and sequencing.  Continues to require physical assist during transfers and ambulation. SNF is appropriate for post acute rehabilitation.  Follow Up Recommendations  SNF;Supervision/Assistance - 24 hour     Equipment Recommendations  None recommended by PT    Recommendations for Other Services Rehab consult     Precautions / Restrictions Precautions Precautions: Fall;Back Precaution Booklet Issued: No Required Braces or Orthoses: Spinal Brace Spinal Brace: Lumbar corset;Applied in sitting position Restrictions Weight Bearing Restrictions: No    Mobility  Bed Mobility Overal bed mobility: Needs Assistance Bed Mobility: Rolling;Sit to Sidelying Rolling: Min assist       Sit to sidelying: Min assist General bed mobility comments: assist to elevate LEs to return to sidelying  Transfers Overall transfer level: Needs assistance Equipment used: Rolling walker (2 wheeled) Transfers: Sit to/from Stand Sit to Stand: Min assist;Mod assist         General transfer comment: Moderate assist form chair, in assist from toilet to power up to standing with modified hand placement and multi modal cues  Ambulation/Gait Ambulation/Gait assistance: Min assist Ambulation Distance (Feet): 60 Feet Assistive device: Rolling walker (2 wheeled) Gait Pattern/deviations: Decreased stance time - right;Decreased dorsiflexion - right Gait velocity: decreased Gait velocity interpretation: <1.8 ft/sec, indicative of risk for recurrent  falls General Gait Details: Limited by pain and RLWE weakness. Continues to demonstrate RLE weakness which worsens with fatigue, cues for setting of RLE remain a necessity   Stairs            Wheelchair Mobility    Modified Rankin (Stroke Patients Only)       Balance Overall balance assessment: Needs assistance Sitting-balance support: Feet supported Sitting balance-Leahy Scale: Fair     Standing balance support: During functional activity;Bilateral upper extremity supported Standing balance-Leahy Scale: Poor Standing balance comment: heavy reliance on UE support                            Cognition Arousal/Alertness: Awake/alert Behavior During Therapy: WFL for tasks assessed/performed Overall Cognitive Status: Within Functional Limits for tasks assessed                                 General Comments: improved cognition today      Exercises      General Comments        Pertinent Vitals/Pain Pain Assessment: Faces Faces Pain Scale: Hurts even more Pain Location: back and right LE Pain Descriptors / Indicators: Grimacing;Guarding;Sore;Spasm Pain Intervention(s): Monitored during session    Home Living                      Prior Function            PT Goals (current goals can now be found in the care plan section) Acute Rehab PT Goals Patient Stated Goal: return to independence PT Goal Formulation: With patient/family Time For Goal Achievement: 05/11/17 Potential  to Achieve Goals: Good Progress towards PT goals: Progressing toward goals    Frequency    Min 5X/week      PT Plan Current plan remains appropriate    Co-evaluation              AM-PAC PT "6 Clicks" Daily Activity  Outcome Measure  Difficulty turning over in bed (including adjusting bedclothes, sheets and blankets)?: Unable Difficulty moving from lying on back to sitting on the side of the bed? : Unable Difficulty sitting down on and  standing up from a chair with arms (e.g., wheelchair, bedside commode, etc,.)?: Unable Help needed moving to and from a bed to chair (including a wheelchair)?: A Lot Help needed walking in hospital room?: A Lot Help needed climbing 3-5 steps with a railing? : Total 6 Click Score: 8    End of Session Equipment Utilized During Treatment: Gait belt;Back brace Activity Tolerance: Patient limited by pain Patient left: in chair;with call bell/phone within reach;with chair alarm set Nurse Communication: Mobility status;Need for lift equipment PT Visit Diagnosis: Other abnormalities of gait and mobility (R26.89);Unsteadiness on feet (R26.81)     Time: 1000-1016 PT Time Calculation (min) (ACUTE ONLY): 16 min  Charges:  $Therapeutic Activity: 8-22 mins                    G Codes:       Alben Deeds, PT DPT  Board Certified Neurologic Specialist [place] 05/03/2017, 11:51 AM

## 2017-05-03 NOTE — Progress Notes (Signed)
Patient ID: Penny Hart, female   DOB: [birth year], 71 y.o.   MRN: [id number] Subjective:  The patient is alert and pleasant. She is in no apparent distress. She does not feel she is getting adequate pain relief with her current regimen.  Objective: Vital signs in last 24 hours: Temp:  [98.4 F (36.9 C)-99.3 F (37.4 C)] 98.4 F (36.9 C) (10/10 0829) Pulse Rate:  [57-88] 75 (10/10 0829) Resp:  [16-18] 16 (10/10 0829) BP: (106-122)/(43-70) 122/64 (10/10 0829) SpO2:  [93 %-100 %] 96 % (10/10 0829)  Intake/Output from previous day: 10/09 0701 - 10/10 0700 In: 600 [P.O.:600] Out: -  Intake/Output this shift: Total I/O In: 360 [P.O.:360] Out: -   Physical exam the patient is alert and oriented. Her strength is grossly normal in the lower extremities. Her lumbar incision is healing well.  Lab Results: No results for input(s): WBC, HGB, HCT, PLT in the last 72 hours. BMET No results for input(s): NA, K, CL, CO2, GLUCOSE, BUN, CREATININE, CALCIUM in the last 72 hours.  Studies/Results: No results found.  Assessment/Plan: Postop day #6: We are awaiting skilled nursing facility placement. She is ready for discharge. I will increase her Dilaudid for better pain control. I have answered all her questions.  LOS: 6 days     Yashar Inclan D 05/03/2017, 11:10 AM

## 2017-05-03 NOTE — H&P (Signed)
Pt discharging at this time to [facility] via [place] with all personal belongings. IV discontinued, dry dressing applied. Discharge instructions provided with verbal understanding. Attempted x4 to give nurse report. Left message with Sharyn Lull the admissions coordinator.

## 2017-05-03 NOTE — Clinical Social Work Placement (Signed)
Nurse to call report to [phone], room 209A    CLINICAL SOCIAL WORK PLACEMENT  NOTE  Date:  05/03/2017  Patient Details  Name: Penny Hart MRN: [id number] Date of Birth: 1945-08-26  Clinical Social Work is seeking post-discharge placement for this patient at the [place] level of care (*CSW will initial, date and re-position this form in  chart as items are completed):  Yes   Patient/family provided with [place] Work Department's list of facilities offering this level of care within the geographic area requested by the patient (or if unable, by the patient's family).  Yes   Patient/family informed of their freedom to choose among providers that offer the needed level of care, that participate in Medicare, Medicaid or managed care program needed by the patient, have an available bed and are willing to accept the patient.  Yes   Patient/family informed of [facility]'s ownership interest in [facility] and [facility], as well as of the fact that they are under no obligation to receive care at these facilities.  PASRR submitted to EDS on 05/02/17     PASRR number received on       Existing PASRR number confirmed on       FL2 transmitted to all facilities in geographic area requested by pt/family on       FL2 transmitted to all facilities within larger geographic area on 05/02/17     Patient informed that his/her managed care company has contracts with or will negotiate with certain facilities, including the following:        Yes   Patient/family informed of bed offers received.  Patient chooses bed at [facility]     Physician recommends and patient chooses bed at      Patient to be transferred to [facility] on 05/03/17.  Patient to be transferred to facility by [facility]     Patient family notified on 05/03/17 of transfer.  Name of family member notified:  Helene Kelp     PHYSICIAN       Additional Comment:     _______________________________________________ Geralynn Ochs, [place] 05/03/2017, 12:24 PM

## 2017-05-04 DIAGNOSIS — I25119 Atherosclerotic heart disease of native coronary artery with unspecified angina pectoris: Secondary | ICD-10-CM | POA: Diagnosis not present

## 2017-05-04 DIAGNOSIS — E1140 Type 2 diabetes mellitus with diabetic neuropathy, unspecified: Secondary | ICD-10-CM | POA: Diagnosis not present

## 2017-05-04 DIAGNOSIS — M4800 Spinal stenosis, site unspecified: Secondary | ICD-10-CM | POA: Diagnosis not present

## 2017-05-04 DIAGNOSIS — J42 Unspecified chronic bronchitis: Secondary | ICD-10-CM | POA: Diagnosis not present

## 2017-05-09 ENCOUNTER — Other Ambulatory Visit
Admission: RE | Admit: 2017-05-09 | Discharge: 2017-05-09 | Disposition: A | Discharge status: Home / Self Care | Payer: Medicare HMO | Source: Skilled Nursing Facility | Attending: Gerontology | Admitting: Gerontology

## 2017-05-09 DIAGNOSIS — I25119 Atherosclerotic heart disease of native coronary artery with unspecified angina pectoris: Secondary | ICD-10-CM | POA: Insufficient documentation

## 2017-05-09 LAB — COMPREHENSIVE METABOLIC PANEL
ALT: 22 U/L (ref 14–54)
AST: 25 U/L (ref 15–41)
Albumin: 2.6 g/dL — ABNORMAL LOW (ref 3.5–5.0)
Alkaline Phosphatase: 72 U/L (ref 38–126)
Anion gap: 12 (ref 5–15)
BUN: 7 mg/dL (ref 6–20)
CO2: 27 mmol/L (ref 22–32)
Calcium: 9.8 mg/dL (ref 8.9–10.3)
Chloride: 95 mmol/L — ABNORMAL LOW (ref 101–111)
Creatinine, Ser: 0.69 mg/dL (ref 0.44–1.00)
GFR calc Af Amer: >60 mL/min (ref >60)
GFR calc non Af Amer: >60 mL/min (ref >60)
Glucose, Bld: 97 mg/dL (ref 65–99)
Potassium: 3.9 mmol/L (ref 3.5–5.1)
Sodium: 134 mmol/L — ABNORMAL LOW (ref 135–145)
Total Bilirubin: 0.7 mg/dL (ref 0.3–1.2)
Total Protein: 7.7 g/dL (ref 6.5–8.1)

## 2017-05-09 LAB — CBC WITH DIFFERENTIAL/PLATELET
Basophils Absolute: 0.1 10*3/uL (ref 0–0.1)
Basophils Relative: 1 %
Eosinophils Absolute: 0.2 10*3/uL (ref 0–0.7)
Eosinophils Relative: 2 %
HCT: 34.7 % — ABNORMAL LOW (ref 35.0–47.0)
Hemoglobin: 11.4 g/dL — ABNORMAL LOW (ref 12.0–16.0)
Lymphocytes Relative: 14 %
Lymphs Abs: 1.5 10*3/uL (ref 1.0–3.6)
MCH: 27.7 pg (ref 26.0–34.0)
MCHC: 32.9 g/dL (ref 32.0–36.0)
MCV: 84.1 fL (ref 80.0–100.0)
Monocytes Absolute: 1.4 10*3/uL — ABNORMAL HIGH (ref 0.2–0.9)
Monocytes Relative: 13 %
Neutro Abs: 7.6 10*3/uL — ABNORMAL HIGH (ref 1.4–6.5)
Neutrophils Relative %: 70 %
Platelets: 455 10*3/uL — ABNORMAL HIGH (ref 150–440)
RBC: 4.12 MIL/uL (ref 3.80–5.20)
RDW: 15.5 % — ABNORMAL HIGH (ref 11.5–14.5)
WBC: 10.7 10*3/uL (ref 3.6–11.0)

## 2017-05-10 ENCOUNTER — Other Ambulatory Visit: Payer: Self-pay

## 2017-05-10 MED ORDER — HYDROMORPHONE HCL 4 MG PO TABS: 4.0000 mg | ORAL_TABLET | ORAL | 0 refills | Status: DC | PRN

## 2017-05-10 NOTE — Telephone Encounter (Signed)
Rx sent to [facility] phone : [phone] , fax : [phone]  

## 2017-05-15 ENCOUNTER — Other Ambulatory Visit: Payer: Self-pay | Admitting: Internal Medicine

## 2017-05-15 DIAGNOSIS — Z1231 Encounter for screening mammogram for malignant neoplasm of breast: Secondary | ICD-10-CM

## 2017-05-19 DIAGNOSIS — M6281 Muscle weakness (generalized): Secondary | ICD-10-CM | POA: Diagnosis not present

## 2017-05-19 DIAGNOSIS — R2689 Other abnormalities of gait and mobility: Secondary | ICD-10-CM | POA: Diagnosis not present

## 2017-05-19 DIAGNOSIS — M4316 Spondylolisthesis, lumbar region: Secondary | ICD-10-CM | POA: Diagnosis not present

## 2017-05-19 DIAGNOSIS — E1140 Type 2 diabetes mellitus with diabetic neuropathy, unspecified: Secondary | ICD-10-CM | POA: Diagnosis not present

## 2017-05-19 DIAGNOSIS — M48062 Spinal stenosis, lumbar region with neurogenic claudication: Secondary | ICD-10-CM | POA: Diagnosis not present

## 2017-05-19 DIAGNOSIS — I10 Essential (primary) hypertension: Secondary | ICD-10-CM | POA: Diagnosis not present

## 2017-05-19 DIAGNOSIS — Z4789 Encounter for other orthopedic aftercare: Secondary | ICD-10-CM | POA: Diagnosis not present

## 2017-05-22 DIAGNOSIS — Z4789 Encounter for other orthopedic aftercare: Secondary | ICD-10-CM | POA: Diagnosis not present

## 2017-05-22 DIAGNOSIS — M48062 Spinal stenosis, lumbar region with neurogenic claudication: Secondary | ICD-10-CM | POA: Diagnosis not present

## 2017-05-22 DIAGNOSIS — I10 Essential (primary) hypertension: Secondary | ICD-10-CM | POA: Diagnosis not present

## 2017-05-22 DIAGNOSIS — M6281 Muscle weakness (generalized): Secondary | ICD-10-CM | POA: Diagnosis not present

## 2017-05-22 DIAGNOSIS — R2689 Other abnormalities of gait and mobility: Secondary | ICD-10-CM | POA: Diagnosis not present

## 2017-05-22 DIAGNOSIS — E1140 Type 2 diabetes mellitus with diabetic neuropathy, unspecified: Secondary | ICD-10-CM | POA: Diagnosis not present

## 2017-05-22 DIAGNOSIS — M4316 Spondylolisthesis, lumbar region: Secondary | ICD-10-CM | POA: Diagnosis not present

## 2017-05-23 DIAGNOSIS — I10 Essential (primary) hypertension: Secondary | ICD-10-CM | POA: Diagnosis not present

## 2017-05-23 DIAGNOSIS — Z4789 Encounter for other orthopedic aftercare: Secondary | ICD-10-CM | POA: Diagnosis not present

## 2017-05-23 DIAGNOSIS — E1140 Type 2 diabetes mellitus with diabetic neuropathy, unspecified: Secondary | ICD-10-CM | POA: Diagnosis not present

## 2017-05-23 DIAGNOSIS — M4316 Spondylolisthesis, lumbar region: Secondary | ICD-10-CM | POA: Diagnosis not present

## 2017-05-23 DIAGNOSIS — M48062 Spinal stenosis, lumbar region with neurogenic claudication: Secondary | ICD-10-CM | POA: Diagnosis not present

## 2017-05-23 DIAGNOSIS — M6281 Muscle weakness (generalized): Secondary | ICD-10-CM | POA: Diagnosis not present

## 2017-05-23 DIAGNOSIS — R2689 Other abnormalities of gait and mobility: Secondary | ICD-10-CM | POA: Diagnosis not present

## 2017-05-29 DIAGNOSIS — I5032 Chronic diastolic (congestive) heart failure: Secondary | ICD-10-CM | POA: Diagnosis not present

## 2017-05-29 DIAGNOSIS — R2689 Other abnormalities of gait and mobility: Secondary | ICD-10-CM | POA: Diagnosis not present

## 2017-05-29 DIAGNOSIS — M6281 Muscle weakness (generalized): Secondary | ICD-10-CM | POA: Diagnosis not present

## 2017-05-29 DIAGNOSIS — I110 Hypertensive heart disease with heart failure: Secondary | ICD-10-CM | POA: Diagnosis not present

## 2017-05-29 DIAGNOSIS — E1140 Type 2 diabetes mellitus with diabetic neuropathy, unspecified: Secondary | ICD-10-CM | POA: Diagnosis not present

## 2017-05-29 DIAGNOSIS — J449 Chronic obstructive pulmonary disease, unspecified: Secondary | ICD-10-CM | POA: Diagnosis not present

## 2017-05-29 DIAGNOSIS — Z87891 Personal history of nicotine dependence: Secondary | ICD-10-CM | POA: Diagnosis not present

## 2017-05-29 DIAGNOSIS — Z4789 Encounter for other orthopedic aftercare: Secondary | ICD-10-CM | POA: Diagnosis not present

## 2017-05-30 DIAGNOSIS — R2689 Other abnormalities of gait and mobility: Secondary | ICD-10-CM | POA: Diagnosis not present

## 2017-05-30 DIAGNOSIS — M6281 Muscle weakness (generalized): Secondary | ICD-10-CM | POA: Diagnosis not present

## 2017-05-30 DIAGNOSIS — Z4789 Encounter for other orthopedic aftercare: Secondary | ICD-10-CM | POA: Diagnosis not present

## 2017-05-30 DIAGNOSIS — J449 Chronic obstructive pulmonary disease, unspecified: Secondary | ICD-10-CM | POA: Diagnosis not present

## 2017-05-30 DIAGNOSIS — Z23 Encounter for immunization: Secondary | ICD-10-CM | POA: Diagnosis not present

## 2017-05-30 DIAGNOSIS — E1169 Type 2 diabetes mellitus with other specified complication: Secondary | ICD-10-CM | POA: Diagnosis not present

## 2017-05-30 DIAGNOSIS — I110 Hypertensive heart disease with heart failure: Secondary | ICD-10-CM | POA: Diagnosis not present

## 2017-05-30 DIAGNOSIS — Z87891 Personal history of nicotine dependence: Secondary | ICD-10-CM | POA: Diagnosis not present

## 2017-05-30 DIAGNOSIS — E1140 Type 2 diabetes mellitus with diabetic neuropathy, unspecified: Secondary | ICD-10-CM | POA: Diagnosis not present

## 2017-05-30 DIAGNOSIS — I25119 Atherosclerotic heart disease of native coronary artery with unspecified angina pectoris: Secondary | ICD-10-CM | POA: Diagnosis not present

## 2017-05-30 DIAGNOSIS — J42 Unspecified chronic bronchitis: Secondary | ICD-10-CM | POA: Diagnosis not present

## 2017-05-30 DIAGNOSIS — E785 Hyperlipidemia, unspecified: Secondary | ICD-10-CM | POA: Diagnosis not present

## 2017-05-30 DIAGNOSIS — I5032 Chronic diastolic (congestive) heart failure: Secondary | ICD-10-CM | POA: Diagnosis not present

## 2017-06-01 DIAGNOSIS — I110 Hypertensive heart disease with heart failure: Secondary | ICD-10-CM | POA: Diagnosis not present

## 2017-06-01 DIAGNOSIS — Z87891 Personal history of nicotine dependence: Secondary | ICD-10-CM | POA: Diagnosis not present

## 2017-06-01 DIAGNOSIS — Z4789 Encounter for other orthopedic aftercare: Secondary | ICD-10-CM | POA: Diagnosis not present

## 2017-06-01 DIAGNOSIS — I5032 Chronic diastolic (congestive) heart failure: Secondary | ICD-10-CM | POA: Diagnosis not present

## 2017-06-01 DIAGNOSIS — J449 Chronic obstructive pulmonary disease, unspecified: Secondary | ICD-10-CM | POA: Diagnosis not present

## 2017-06-01 DIAGNOSIS — R2689 Other abnormalities of gait and mobility: Secondary | ICD-10-CM | POA: Diagnosis not present

## 2017-06-01 DIAGNOSIS — E1140 Type 2 diabetes mellitus with diabetic neuropathy, unspecified: Secondary | ICD-10-CM | POA: Diagnosis not present

## 2017-06-01 DIAGNOSIS — M6281 Muscle weakness (generalized): Secondary | ICD-10-CM | POA: Diagnosis not present

## 2017-06-02 DIAGNOSIS — I5032 Chronic diastolic (congestive) heart failure: Secondary | ICD-10-CM | POA: Diagnosis not present

## 2017-06-02 DIAGNOSIS — I110 Hypertensive heart disease with heart failure: Secondary | ICD-10-CM | POA: Diagnosis not present

## 2017-06-02 DIAGNOSIS — M6281 Muscle weakness (generalized): Secondary | ICD-10-CM | POA: Diagnosis not present

## 2017-06-02 DIAGNOSIS — Z87891 Personal history of nicotine dependence: Secondary | ICD-10-CM | POA: Diagnosis not present

## 2017-06-02 DIAGNOSIS — E1140 Type 2 diabetes mellitus with diabetic neuropathy, unspecified: Secondary | ICD-10-CM | POA: Diagnosis not present

## 2017-06-02 DIAGNOSIS — J449 Chronic obstructive pulmonary disease, unspecified: Secondary | ICD-10-CM | POA: Diagnosis not present

## 2017-06-02 DIAGNOSIS — Z4789 Encounter for other orthopedic aftercare: Secondary | ICD-10-CM | POA: Diagnosis not present

## 2017-06-02 DIAGNOSIS — R2689 Other abnormalities of gait and mobility: Secondary | ICD-10-CM | POA: Diagnosis not present

## 2017-06-06 DIAGNOSIS — J449 Chronic obstructive pulmonary disease, unspecified: Secondary | ICD-10-CM | POA: Diagnosis not present

## 2017-06-06 DIAGNOSIS — Z4789 Encounter for other orthopedic aftercare: Secondary | ICD-10-CM | POA: Diagnosis not present

## 2017-06-06 DIAGNOSIS — I110 Hypertensive heart disease with heart failure: Secondary | ICD-10-CM | POA: Diagnosis not present

## 2017-06-06 DIAGNOSIS — Z87891 Personal history of nicotine dependence: Secondary | ICD-10-CM | POA: Diagnosis not present

## 2017-06-06 DIAGNOSIS — R2689 Other abnormalities of gait and mobility: Secondary | ICD-10-CM | POA: Diagnosis not present

## 2017-06-06 DIAGNOSIS — E1140 Type 2 diabetes mellitus with diabetic neuropathy, unspecified: Secondary | ICD-10-CM | POA: Diagnosis not present

## 2017-06-06 DIAGNOSIS — M6281 Muscle weakness (generalized): Secondary | ICD-10-CM | POA: Diagnosis not present

## 2017-06-06 DIAGNOSIS — I5032 Chronic diastolic (congestive) heart failure: Secondary | ICD-10-CM | POA: Diagnosis not present

## 2017-06-07 DIAGNOSIS — M6281 Muscle weakness (generalized): Secondary | ICD-10-CM | POA: Diagnosis not present

## 2017-06-07 DIAGNOSIS — I5032 Chronic diastolic (congestive) heart failure: Secondary | ICD-10-CM | POA: Diagnosis not present

## 2017-06-07 DIAGNOSIS — I110 Hypertensive heart disease with heart failure: Secondary | ICD-10-CM | POA: Diagnosis not present

## 2017-06-07 DIAGNOSIS — R2689 Other abnormalities of gait and mobility: Secondary | ICD-10-CM | POA: Diagnosis not present

## 2017-06-07 DIAGNOSIS — Z87891 Personal history of nicotine dependence: Secondary | ICD-10-CM | POA: Diagnosis not present

## 2017-06-07 DIAGNOSIS — E1140 Type 2 diabetes mellitus with diabetic neuropathy, unspecified: Secondary | ICD-10-CM | POA: Diagnosis not present

## 2017-06-07 DIAGNOSIS — J449 Chronic obstructive pulmonary disease, unspecified: Secondary | ICD-10-CM | POA: Diagnosis not present

## 2017-06-07 DIAGNOSIS — Z4789 Encounter for other orthopedic aftercare: Secondary | ICD-10-CM | POA: Diagnosis not present

## 2017-06-08 DIAGNOSIS — Z87891 Personal history of nicotine dependence: Secondary | ICD-10-CM | POA: Diagnosis not present

## 2017-06-08 DIAGNOSIS — M6281 Muscle weakness (generalized): Secondary | ICD-10-CM | POA: Diagnosis not present

## 2017-06-08 DIAGNOSIS — Z4789 Encounter for other orthopedic aftercare: Secondary | ICD-10-CM | POA: Diagnosis not present

## 2017-06-08 DIAGNOSIS — J449 Chronic obstructive pulmonary disease, unspecified: Secondary | ICD-10-CM | POA: Diagnosis not present

## 2017-06-08 DIAGNOSIS — R2689 Other abnormalities of gait and mobility: Secondary | ICD-10-CM | POA: Diagnosis not present

## 2017-06-08 DIAGNOSIS — I110 Hypertensive heart disease with heart failure: Secondary | ICD-10-CM | POA: Diagnosis not present

## 2017-06-08 DIAGNOSIS — I5032 Chronic diastolic (congestive) heart failure: Secondary | ICD-10-CM | POA: Diagnosis not present

## 2017-06-08 DIAGNOSIS — E1140 Type 2 diabetes mellitus with diabetic neuropathy, unspecified: Secondary | ICD-10-CM | POA: Diagnosis not present

## 2017-06-09 DIAGNOSIS — Z4789 Encounter for other orthopedic aftercare: Secondary | ICD-10-CM | POA: Diagnosis not present

## 2017-06-09 DIAGNOSIS — E1140 Type 2 diabetes mellitus with diabetic neuropathy, unspecified: Secondary | ICD-10-CM | POA: Diagnosis not present

## 2017-06-09 DIAGNOSIS — Z87891 Personal history of nicotine dependence: Secondary | ICD-10-CM | POA: Diagnosis not present

## 2017-06-09 DIAGNOSIS — M6281 Muscle weakness (generalized): Secondary | ICD-10-CM | POA: Diagnosis not present

## 2017-06-09 DIAGNOSIS — I5032 Chronic diastolic (congestive) heart failure: Secondary | ICD-10-CM | POA: Diagnosis not present

## 2017-06-09 DIAGNOSIS — I110 Hypertensive heart disease with heart failure: Secondary | ICD-10-CM | POA: Diagnosis not present

## 2017-06-09 DIAGNOSIS — J449 Chronic obstructive pulmonary disease, unspecified: Secondary | ICD-10-CM | POA: Diagnosis not present

## 2017-06-09 DIAGNOSIS — R2689 Other abnormalities of gait and mobility: Secondary | ICD-10-CM | POA: Diagnosis not present

## 2017-06-12 DIAGNOSIS — I110 Hypertensive heart disease with heart failure: Secondary | ICD-10-CM | POA: Diagnosis not present

## 2017-06-12 DIAGNOSIS — J449 Chronic obstructive pulmonary disease, unspecified: Secondary | ICD-10-CM | POA: Diagnosis not present

## 2017-06-12 DIAGNOSIS — M6281 Muscle weakness (generalized): Secondary | ICD-10-CM | POA: Diagnosis not present

## 2017-06-12 DIAGNOSIS — Z4789 Encounter for other orthopedic aftercare: Secondary | ICD-10-CM | POA: Diagnosis not present

## 2017-06-12 DIAGNOSIS — Z87891 Personal history of nicotine dependence: Secondary | ICD-10-CM | POA: Diagnosis not present

## 2017-06-12 DIAGNOSIS — I5032 Chronic diastolic (congestive) heart failure: Secondary | ICD-10-CM | POA: Diagnosis not present

## 2017-06-12 DIAGNOSIS — R2689 Other abnormalities of gait and mobility: Secondary | ICD-10-CM | POA: Diagnosis not present

## 2017-06-12 DIAGNOSIS — E1140 Type 2 diabetes mellitus with diabetic neuropathy, unspecified: Secondary | ICD-10-CM | POA: Diagnosis not present

## 2017-06-14 DIAGNOSIS — I5032 Chronic diastolic (congestive) heart failure: Secondary | ICD-10-CM | POA: Diagnosis not present

## 2017-06-14 DIAGNOSIS — Z87891 Personal history of nicotine dependence: Secondary | ICD-10-CM | POA: Diagnosis not present

## 2017-06-14 DIAGNOSIS — M6281 Muscle weakness (generalized): Secondary | ICD-10-CM | POA: Diagnosis not present

## 2017-06-14 DIAGNOSIS — J449 Chronic obstructive pulmonary disease, unspecified: Secondary | ICD-10-CM | POA: Diagnosis not present

## 2017-06-14 DIAGNOSIS — E1140 Type 2 diabetes mellitus with diabetic neuropathy, unspecified: Secondary | ICD-10-CM | POA: Diagnosis not present

## 2017-06-14 DIAGNOSIS — R2689 Other abnormalities of gait and mobility: Secondary | ICD-10-CM | POA: Diagnosis not present

## 2017-06-14 DIAGNOSIS — I110 Hypertensive heart disease with heart failure: Secondary | ICD-10-CM | POA: Diagnosis not present

## 2017-06-14 DIAGNOSIS — Z4789 Encounter for other orthopedic aftercare: Secondary | ICD-10-CM | POA: Diagnosis not present

## 2017-06-20 DIAGNOSIS — I110 Hypertensive heart disease with heart failure: Secondary | ICD-10-CM | POA: Diagnosis not present

## 2017-06-20 DIAGNOSIS — Z4789 Encounter for other orthopedic aftercare: Secondary | ICD-10-CM | POA: Diagnosis not present

## 2017-06-20 DIAGNOSIS — J449 Chronic obstructive pulmonary disease, unspecified: Secondary | ICD-10-CM | POA: Diagnosis not present

## 2017-06-20 DIAGNOSIS — R2689 Other abnormalities of gait and mobility: Secondary | ICD-10-CM | POA: Diagnosis not present

## 2017-06-20 DIAGNOSIS — E1140 Type 2 diabetes mellitus with diabetic neuropathy, unspecified: Secondary | ICD-10-CM | POA: Diagnosis not present

## 2017-06-20 DIAGNOSIS — M6281 Muscle weakness (generalized): Secondary | ICD-10-CM | POA: Diagnosis not present

## 2017-06-20 DIAGNOSIS — Z87891 Personal history of nicotine dependence: Secondary | ICD-10-CM | POA: Diagnosis not present

## 2017-06-20 DIAGNOSIS — I5032 Chronic diastolic (congestive) heart failure: Secondary | ICD-10-CM | POA: Diagnosis not present

## 2017-06-21 DIAGNOSIS — Z4789 Encounter for other orthopedic aftercare: Secondary | ICD-10-CM | POA: Diagnosis not present

## 2017-06-21 DIAGNOSIS — I5032 Chronic diastolic (congestive) heart failure: Secondary | ICD-10-CM | POA: Diagnosis not present

## 2017-06-21 DIAGNOSIS — I10 Essential (primary) hypertension: Secondary | ICD-10-CM | POA: Diagnosis not present

## 2017-06-21 DIAGNOSIS — E1140 Type 2 diabetes mellitus with diabetic neuropathy, unspecified: Secondary | ICD-10-CM | POA: Diagnosis not present

## 2017-06-22 DIAGNOSIS — Z4789 Encounter for other orthopedic aftercare: Secondary | ICD-10-CM | POA: Diagnosis not present

## 2017-06-22 DIAGNOSIS — E1140 Type 2 diabetes mellitus with diabetic neuropathy, unspecified: Secondary | ICD-10-CM | POA: Diagnosis not present

## 2017-06-22 DIAGNOSIS — M6281 Muscle weakness (generalized): Secondary | ICD-10-CM | POA: Diagnosis not present

## 2017-06-22 DIAGNOSIS — I5032 Chronic diastolic (congestive) heart failure: Secondary | ICD-10-CM | POA: Diagnosis not present

## 2017-06-22 DIAGNOSIS — R2689 Other abnormalities of gait and mobility: Secondary | ICD-10-CM | POA: Diagnosis not present

## 2017-06-22 DIAGNOSIS — J449 Chronic obstructive pulmonary disease, unspecified: Secondary | ICD-10-CM | POA: Diagnosis not present

## 2017-06-22 DIAGNOSIS — I110 Hypertensive heart disease with heart failure: Secondary | ICD-10-CM | POA: Diagnosis not present

## 2017-06-22 DIAGNOSIS — Z87891 Personal history of nicotine dependence: Secondary | ICD-10-CM | POA: Diagnosis not present

## 2017-06-23 DIAGNOSIS — M6281 Muscle weakness (generalized): Secondary | ICD-10-CM | POA: Diagnosis not present

## 2017-06-23 DIAGNOSIS — M4316 Spondylolisthesis, lumbar region: Secondary | ICD-10-CM | POA: Diagnosis not present

## 2017-06-23 DIAGNOSIS — R2689 Other abnormalities of gait and mobility: Secondary | ICD-10-CM | POA: Diagnosis not present

## 2017-06-23 DIAGNOSIS — I110 Hypertensive heart disease with heart failure: Secondary | ICD-10-CM | POA: Diagnosis not present

## 2017-06-23 DIAGNOSIS — Z87891 Personal history of nicotine dependence: Secondary | ICD-10-CM | POA: Diagnosis not present

## 2017-06-23 DIAGNOSIS — J449 Chronic obstructive pulmonary disease, unspecified: Secondary | ICD-10-CM | POA: Diagnosis not present

## 2017-06-23 DIAGNOSIS — Z4789 Encounter for other orthopedic aftercare: Secondary | ICD-10-CM | POA: Diagnosis not present

## 2017-06-23 DIAGNOSIS — E1140 Type 2 diabetes mellitus with diabetic neuropathy, unspecified: Secondary | ICD-10-CM | POA: Diagnosis not present

## 2017-06-23 DIAGNOSIS — I5032 Chronic diastolic (congestive) heart failure: Secondary | ICD-10-CM | POA: Diagnosis not present

## 2017-06-29 DIAGNOSIS — M4316 Spondylolisthesis, lumbar region: Secondary | ICD-10-CM | POA: Diagnosis not present

## 2017-07-10 ENCOUNTER — Ambulatory Visit
Admission: RE | Admit: 2017-07-10 | Discharge: 2017-07-10 | Disposition: A | Discharge status: Home / Self Care | Payer: Medicare HMO | Source: Ambulatory Visit | Attending: Internal Medicine | Admitting: Internal Medicine

## 2017-07-10 DIAGNOSIS — Z1231 Encounter for screening mammogram for malignant neoplasm of breast: Secondary | ICD-10-CM

## 2017-07-25 HISTORY — PX: BACK SURGERY: SHX140

## 2017-09-04 ENCOUNTER — Emergency Department
Admission: EM | Admit: 2017-09-04 | Discharge: 2017-09-04 | Disposition: A | Discharge status: Home / Self Care | Payer: Medicare Other | Attending: Emergency Medicine | Admitting: Emergency Medicine

## 2017-09-04 ENCOUNTER — Emergency Department: Payer: Medicare Other

## 2017-09-04 ENCOUNTER — Other Ambulatory Visit: Payer: Self-pay

## 2017-09-04 DIAGNOSIS — Z96652 Presence of left artificial knee joint: Secondary | ICD-10-CM | POA: Diagnosis not present

## 2017-09-04 DIAGNOSIS — Z79899 Other long term (current) drug therapy: Secondary | ICD-10-CM | POA: Diagnosis not present

## 2017-09-04 DIAGNOSIS — Z87891 Personal history of nicotine dependence: Secondary | ICD-10-CM | POA: Diagnosis not present

## 2017-09-04 DIAGNOSIS — E119 Type 2 diabetes mellitus without complications: Secondary | ICD-10-CM | POA: Insufficient documentation

## 2017-09-04 DIAGNOSIS — R0789 Other chest pain: Secondary | ICD-10-CM

## 2017-09-04 DIAGNOSIS — J449 Chronic obstructive pulmonary disease, unspecified: Secondary | ICD-10-CM | POA: Insufficient documentation

## 2017-09-04 DIAGNOSIS — I110 Hypertensive heart disease with heart failure: Secondary | ICD-10-CM | POA: Diagnosis not present

## 2017-09-04 DIAGNOSIS — I5032 Chronic diastolic (congestive) heart failure: Secondary | ICD-10-CM | POA: Insufficient documentation

## 2017-09-04 LAB — TROPONIN I: Troponin I: <0.03 ng/mL (ref <0.03)

## 2017-09-04 LAB — CBC
HCT: 39.9 % (ref 35.0–47.0)
Hemoglobin: 13 g/dL (ref 12.0–16.0)
MCH: 27.4 pg (ref 26.0–34.0)
MCHC: 32.6 g/dL (ref 32.0–36.0)
MCV: 84.1 fL (ref 80.0–100.0)
Platelets: 257 10*3/uL (ref 150–440)
RBC: 4.74 MIL/uL (ref 3.80–5.20)
RDW: 16.9 % — ABNORMAL HIGH (ref 11.5–14.5)
WBC: 8.7 10*3/uL (ref 3.6–11.0)

## 2017-09-04 LAB — BASIC METABOLIC PANEL
Anion gap: 10 (ref 5–15)
BUN: 11 mg/dL (ref 6–20)
CO2: 25 mmol/L (ref 22–32)
Calcium: 9.1 mg/dL (ref 8.9–10.3)
Chloride: 103 mmol/L (ref 101–111)
Creatinine, Ser: 0.85 mg/dL (ref 0.44–1.00)
GFR calc Af Amer: >60 mL/min (ref >60)
GFR calc non Af Amer: >60 mL/min (ref >60)
Glucose, Bld: 90 mg/dL (ref 65–99)
Potassium: 2.9 mmol/L — ABNORMAL LOW (ref 3.5–5.1)
Sodium: 138 mmol/L (ref 135–145)

## 2017-09-04 NOTE — ED Triage Notes (Signed)
Pt arrived via ems for c/o chest pain that started this am - pt has hx of htn, angina and chf - pt denies shortness of breath - pt states that she has chest pressure that is going and coming with burping - Dr Corky Downs at bedside

## 2017-09-04 NOTE — ED Provider Notes (Signed)
[facility] Emergency Department Provider Note   ____________________________________________    I have reviewed the triage vital signs and the nursing notes.   HISTORY  Chief Complaint Chest Pain     HPI Taytem Norman Clay is a 72 y.o. female who presents with complaints of chest tightness.  Patient reports since she woke up this morning she has felt mild intermittent tightness in her chest.  She is also felt like her heart rate has been increasing and decreasing.  Currently she says she feels well and has no significant complaints.  Aspirin given via EMS.  Review of records demonstrates she sees Dr. Clayborn Bigness for history of coronary artery disease.  Besides the aspirin has not taken anything else today.   Past Medical History:  Diagnosis Date  . Arthritis   . CHF (congestive heart failure) ([place])   . COPD (chronic obstructive pulmonary disease) ([place])   . Diabetes mellitus without complication ([place])   . GERD (gastroesophageal reflux disease)   . Glaucoma   . Hiatal hernia   . Hyperlipidemia   . Hypertension   . Neuropathy   . Sleep apnea   . Spondylolisthesis of lumbar region     Patient Active Problem List   Diagnosis Date Noted  . Surgery, elective   . Chronic diastolic congestive heart failure ([place])   . Chronic obstructive pulmonary disease ([place])   . Diabetes mellitus type 2 in nonobese (HCC)   . Benign essential HTN   . Post-operative pain   . Hyponatremia   . Leukocytosis   . Spondylolisthesis of lumbar region 04/27/2017  . Blood in stool   . Hiatal hernia   . Benign neoplasm of cecum   . Benign neoplasm of transverse colon   . Diverticulosis of large intestine without diverticulitis   . Rectal bleeding 05/29/2015  . Non-intractable cyclical vomiting with nausea     Past Surgical History:  Procedure Laterality Date  . ABDOMINAL HYSTERECTOMY    . BREAST EXCISIONAL BIOPSY Left yrs ago    benign  . COLONOSCOPY WITH PROPOFOL N/A  05/30/2015   Procedure: COLONOSCOPY WITH PROPOFOL;  Surgeon: Lucilla Lame, MD;  Location: [facility] ENDOSCOPY;  Service: Endoscopy;  Laterality: N/A;  . ESOPHAGOGASTRODUODENOSCOPY (EGD) WITH PROPOFOL N/A 05/30/2015   Procedure: ESOPHAGOGASTRODUODENOSCOPY (EGD) WITH PROPOFOL;  Surgeon: Lucilla Lame, MD;  Location: [facility] ENDOSCOPY;  Service: Endoscopy;  Laterality: N/A;  . EYE SURGERY     Bialteral for glaucoma  . Eye surgery for glaucoma    . JOINT REPLACEMENT     Left  . Left total knee replacement    . OOPHORECTOMY    . POSTERIOR LUMBAR FUSION  04/27/2017  . TUBAL LIGATION      Prior to Admission medications   Medication Sig Start Date End Date Taking? Authorizing Provider  amLODipine (NORVASC) 5 MG tablet Take 1 tablet by mouth daily.    [provider]  cholecalciferol (VITAMIN D) 1000 units tablet Take 1,000 Units by mouth daily.    [provider]  docusate sodium (COLACE) 100 MG capsule Take 1 capsule (100 mg total) by mouth 2 (two) times daily. 05/02/17   Newman Pies, MD  furosemide (LASIX) 20 MG tablet Take 1 tablet by mouth daily.    [provider]  gabapentin (NEURONTIN) 100 MG capsule Take 200 mg by mouth 3 (three) times daily.     [provider]  hydrocortisone (ANUSOL-HC) 25 MG suppository Place 25 mg rectally 2 (two) times daily as  needed for hemorrhoids or itching.    [provider]  HYDROmorphone (DILAUDID) 4 MG tablet Take 1 tablet (4 mg total) by mouth every 4 (four) hours as needed for severe pain. 05/10/17   Toni Arthurs, NP  metoprolol tartrate (LOPRESSOR) 25 MG tablet Take 12.5 mg by mouth 2 (two) times daily.     [provider]  omeprazole (PRILOSEC) 20 MG capsule Take 20 mg by mouth daily as needed.    [provider]  potassium chloride SA (K-DUR,KLOR-CON) 20 MEQ tablet Take 1 tablet by mouth daily.    [provider]  pravastatin (PRAVACHOL) 10 MG tablet Take 1 tablet by mouth every evening.     [provider]  venlafaxine XR (EFFEXOR-XR) 75 MG 24 hr capsule Take 75 mg by mouth daily with breakfast.    [provider]     Allergies Oxycodone-acetaminophen and Tramadol  Family History  Problem Relation Age of Onset  . Diabetes Mother     Social History Social History   Tobacco Use  . Smoking status: Former Research scientist (life sciences)  . Smokeless tobacco: Never Used  Substance Use Topics  . Alcohol use: No  . Drug use: No    Review of Systems  Constitutional: No fever/chills Eyes: No visual changes.  ENT: No sore throat. Cardiovascular: Chest tightness as described above, central chest Respiratory: Denies shortness of breath. Gastrointestinal: No abdominal pain.   Genitourinary: Negative for dysuria. Musculoskeletal: Negative for back pain. Skin: Negative for rash. Neurological: Negative for headaches    ____________________________________________   PHYSICAL EXAM:  VITAL SIGNS: ED Triage Vitals [09/04/17 1338]  Enc Vitals Group     BP 130/78     Pulse Rate (!) 51     Resp 16     Temp (!) 97.5 F (36.4 C)     Temp Source Oral     SpO2 99 %     Weight 81.6 kg (180 lb)     Height 1.676 m (5\' 6" )     Head Circumference      Peak Flow      Pain Score 0     Pain Loc      Pain Edu?      Excl. in [place]?     Constitutional: Alert and oriented. No acute distress. Pleasant and interactive Eyes: Conjunctivae are normal.   Nose: No congestion/rhinnorhea. Mouth/Throat: Mucous membranes are moist.    Cardiovascular: Mild bradycardia, regular rhythm. Grossly normal heart sounds.  Good peripheral circulation. Respiratory: Normal respiratory effort.  No retractions. Lungs CTAB. Gastrointestinal: Soft and nontender. No distention.  No CVA tenderness. Genitourinary: deferred Musculoskeletal:  Warm and well perfused, no calf pain tenderness Neurologic:  Normal speech and language. No gross focal neurologic deficits are appreciated.  Skin:  Skin is warm, dry  and intact. No rash noted. Psychiatric: Mood and affect are normal. Speech and behavior are normal.  ____________________________________________   LABS (all labs ordered are listed, but only abnormal results are displayed)  Labs Reviewed  BASIC METABOLIC PANEL - Abnormal; Notable for the following components:      Result Value   Potassium 2.9 (*)    All other components within normal limits  CBC - Abnormal; Notable for the following components:   RDW 16.9 (*)    All other components within normal limits  TROPONIN I   ____________________________________________  EKG  ED ECG REPORT I, Lavonia Drafts, the attending physician, personally viewed and interpreted this ECG.  Date: 09/04/2017  Rhythm: Bradycardia  QRS Axis: normal Intervals: normal ST/T Wave abnormalities: normal Narrative Interpretation: no evidence of acute ischemia  ____________________________________________  RADIOLOGY  Chest x-ray normal ____________________________________________   PROCEDURES  Procedure(s) performed: No  Procedures   Critical Care performed: No ____________________________________________   INITIAL IMPRESSION / ASSESSMENT AND PLAN / ED COURSE  Pertinent labs & imaging results that were available during my care of the patient were reviewed by me and considered in my medical decision making (see chart for details).  Patient overall well-appearing in no acute distress.  History of coronary artery disease with brief episode chest tightness today.  We will obtain labs, EKG, chest x-ray and monitor closely  ----------------------------------------- 3:19 PM on 09/04/2017 -----------------------------------------  Lab work is unremarkable, troponin normal.  EKG unremarkable.  Chest x-ray normal.  Patient has been pain-free in the emergency department.  Discussed with her and her family, they will follow-up with cardiology, any return of pain will come back to the emergency  department.    ____________________________________________   FINAL CLINICAL IMPRESSION(S) / ED DIAGNOSES  Final diagnoses:  Atypical chest pain        Note:  This document was prepared using Dragon voice recognition software and may include unintentional dictation errors.    Lavonia Drafts, MD 09/04/17 1520

## 2017-09-27 DIAGNOSIS — E1140 Type 2 diabetes mellitus with diabetic neuropathy, unspecified: Secondary | ICD-10-CM | POA: Diagnosis not present

## 2017-09-27 DIAGNOSIS — E785 Hyperlipidemia, unspecified: Secondary | ICD-10-CM | POA: Diagnosis not present

## 2017-09-27 DIAGNOSIS — E1169 Type 2 diabetes mellitus with other specified complication: Secondary | ICD-10-CM | POA: Diagnosis not present

## 2017-09-27 DIAGNOSIS — Z6835 Body mass index (BMI) 35.0-35.9, adult: Secondary | ICD-10-CM | POA: Diagnosis not present

## 2017-09-27 DIAGNOSIS — I25119 Atherosclerotic heart disease of native coronary artery with unspecified angina pectoris: Secondary | ICD-10-CM | POA: Diagnosis not present

## 2017-09-27 DIAGNOSIS — J42 Unspecified chronic bronchitis: Secondary | ICD-10-CM | POA: Diagnosis not present

## 2017-09-27 DIAGNOSIS — I700 Atherosclerosis of aorta: Secondary | ICD-10-CM | POA: Diagnosis not present

## 2017-09-28 DIAGNOSIS — R6889 Other general symptoms and signs: Secondary | ICD-10-CM | POA: Diagnosis not present

## 2017-09-28 DIAGNOSIS — I208 Other forms of angina pectoris: Secondary | ICD-10-CM | POA: Diagnosis not present

## 2017-09-28 DIAGNOSIS — R0602 Shortness of breath: Secondary | ICD-10-CM | POA: Diagnosis not present

## 2017-10-02 DIAGNOSIS — R0602 Shortness of breath: Secondary | ICD-10-CM | POA: Diagnosis not present

## 2017-10-02 DIAGNOSIS — G4730 Sleep apnea, unspecified: Secondary | ICD-10-CM | POA: Diagnosis not present

## 2017-10-02 DIAGNOSIS — I10 Essential (primary) hypertension: Secondary | ICD-10-CM | POA: Diagnosis not present

## 2017-10-02 DIAGNOSIS — I208 Other forms of angina pectoris: Secondary | ICD-10-CM | POA: Diagnosis not present

## 2017-10-02 DIAGNOSIS — E1140 Type 2 diabetes mellitus with diabetic neuropathy, unspecified: Secondary | ICD-10-CM | POA: Diagnosis not present

## 2017-10-02 DIAGNOSIS — J42 Unspecified chronic bronchitis: Secondary | ICD-10-CM | POA: Diagnosis not present

## 2017-10-02 DIAGNOSIS — K219 Gastro-esophageal reflux disease without esophagitis: Secondary | ICD-10-CM | POA: Diagnosis not present

## 2017-10-02 DIAGNOSIS — M1990 Unspecified osteoarthritis, unspecified site: Secondary | ICD-10-CM | POA: Diagnosis not present

## 2017-10-02 DIAGNOSIS — E1169 Type 2 diabetes mellitus with other specified complication: Secondary | ICD-10-CM | POA: Diagnosis not present

## 2017-10-23 DIAGNOSIS — R6889 Other general symptoms and signs: Secondary | ICD-10-CM | POA: Diagnosis not present

## 2017-10-23 DIAGNOSIS — H401133 Primary open-angle glaucoma, bilateral, severe stage: Secondary | ICD-10-CM | POA: Diagnosis not present

## 2017-11-28 DIAGNOSIS — R6889 Other general symptoms and signs: Secondary | ICD-10-CM | POA: Diagnosis not present

## 2017-11-28 DIAGNOSIS — H401133 Primary open-angle glaucoma, bilateral, severe stage: Secondary | ICD-10-CM | POA: Diagnosis not present

## 2017-12-06 DIAGNOSIS — H401133 Primary open-angle glaucoma, bilateral, severe stage: Secondary | ICD-10-CM | POA: Diagnosis not present

## 2018-02-06 DIAGNOSIS — H401133 Primary open-angle glaucoma, bilateral, severe stage: Secondary | ICD-10-CM | POA: Diagnosis not present

## 2018-02-07 DIAGNOSIS — R21 Rash and other nonspecific skin eruption: Secondary | ICD-10-CM | POA: Diagnosis not present

## 2018-02-26 DIAGNOSIS — Z0100 Encounter for examination of eyes and vision without abnormal findings: Secondary | ICD-10-CM | POA: Diagnosis not present

## 2018-03-17 DIAGNOSIS — T1490XA Injury, unspecified, initial encounter: Secondary | ICD-10-CM | POA: Diagnosis not present

## 2018-03-17 DIAGNOSIS — R51 Headache: Secondary | ICD-10-CM | POA: Diagnosis not present

## 2018-04-05 ENCOUNTER — Other Ambulatory Visit: Payer: Self-pay | Admitting: Internal Medicine

## 2018-04-05 ENCOUNTER — Ambulatory Visit
Admission: RE | Admit: 2018-04-05 | Discharge: 2018-04-05 | Disposition: A | Discharge status: Home / Self Care | Payer: Medicare HMO | Source: Ambulatory Visit | Attending: Internal Medicine | Admitting: Internal Medicine

## 2018-04-05 DIAGNOSIS — E785 Hyperlipidemia, unspecified: Secondary | ICD-10-CM | POA: Diagnosis not present

## 2018-04-05 DIAGNOSIS — W19XXXA Unspecified fall, initial encounter: Secondary | ICD-10-CM

## 2018-04-05 DIAGNOSIS — I25119 Atherosclerotic heart disease of native coronary artery with unspecified angina pectoris: Secondary | ICD-10-CM | POA: Diagnosis not present

## 2018-04-05 DIAGNOSIS — H02401 Unspecified ptosis of right eyelid: Secondary | ICD-10-CM

## 2018-04-05 DIAGNOSIS — I700 Atherosclerosis of aorta: Secondary | ICD-10-CM | POA: Diagnosis not present

## 2018-04-05 DIAGNOSIS — R42 Dizziness and giddiness: Secondary | ICD-10-CM

## 2018-04-05 DIAGNOSIS — R51 Headache: Secondary | ICD-10-CM | POA: Diagnosis not present

## 2018-04-05 DIAGNOSIS — E1169 Type 2 diabetes mellitus with other specified complication: Secondary | ICD-10-CM | POA: Diagnosis not present

## 2018-04-05 DIAGNOSIS — R270 Ataxia, unspecified: Secondary | ICD-10-CM

## 2018-04-05 DIAGNOSIS — E1140 Type 2 diabetes mellitus with diabetic neuropathy, unspecified: Secondary | ICD-10-CM | POA: Diagnosis not present

## 2018-04-05 DIAGNOSIS — J42 Unspecified chronic bronchitis: Secondary | ICD-10-CM | POA: Diagnosis not present

## 2018-04-05 DIAGNOSIS — Z0000 Encounter for general adult medical examination without abnormal findings: Secondary | ICD-10-CM | POA: Diagnosis not present

## 2018-04-05 DIAGNOSIS — R6889 Other general symptoms and signs: Secondary | ICD-10-CM | POA: Diagnosis not present

## 2018-04-06 ENCOUNTER — Other Ambulatory Visit: Payer: Self-pay | Admitting: Internal Medicine

## 2018-04-06 DIAGNOSIS — R270 Ataxia, unspecified: Secondary | ICD-10-CM

## 2018-04-06 DIAGNOSIS — H02409 Unspecified ptosis of unspecified eyelid: Secondary | ICD-10-CM

## 2018-04-10 DIAGNOSIS — H401133 Primary open-angle glaucoma, bilateral, severe stage: Secondary | ICD-10-CM | POA: Diagnosis not present

## 2018-04-10 DIAGNOSIS — R6889 Other general symptoms and signs: Secondary | ICD-10-CM | POA: Diagnosis not present

## 2018-04-26 DIAGNOSIS — H401133 Primary open-angle glaucoma, bilateral, severe stage: Secondary | ICD-10-CM | POA: Diagnosis not present

## 2018-05-15 DIAGNOSIS — H401133 Primary open-angle glaucoma, bilateral, severe stage: Secondary | ICD-10-CM | POA: Diagnosis not present

## 2018-06-12 ENCOUNTER — Other Ambulatory Visit (HOSPITAL_COMMUNITY): Payer: Self-pay | Admitting: Neurology

## 2018-06-12 DIAGNOSIS — R4189 Other symptoms and signs involving cognitive functions and awareness: Secondary | ICD-10-CM | POA: Diagnosis not present

## 2018-06-12 DIAGNOSIS — G44309 Post-traumatic headache, unspecified, not intractable: Secondary | ICD-10-CM | POA: Diagnosis not present

## 2018-06-12 DIAGNOSIS — E559 Vitamin D deficiency, unspecified: Secondary | ICD-10-CM | POA: Diagnosis not present

## 2018-06-12 DIAGNOSIS — R2689 Other abnormalities of gait and mobility: Secondary | ICD-10-CM | POA: Diagnosis not present

## 2018-06-12 DIAGNOSIS — E538 Deficiency of other specified B group vitamins: Secondary | ICD-10-CM | POA: Diagnosis not present

## 2018-06-13 DIAGNOSIS — H401133 Primary open-angle glaucoma, bilateral, severe stage: Secondary | ICD-10-CM | POA: Diagnosis not present

## 2018-06-18 DIAGNOSIS — Z298 Encounter for other specified prophylactic measures: Secondary | ICD-10-CM | POA: Diagnosis not present

## 2018-06-22 ENCOUNTER — Ambulatory Visit (HOSPITAL_COMMUNITY): Payer: Medicare HMO

## 2018-06-24 ENCOUNTER — Encounter: Payer: Self-pay | Admitting: Emergency Medicine

## 2018-06-24 ENCOUNTER — Other Ambulatory Visit: Payer: Self-pay

## 2018-06-24 ENCOUNTER — Emergency Department
Admission: EM | Admit: 2018-06-24 | Discharge: 2018-06-24 | Disposition: A | Discharge status: Home / Self Care | Payer: Medicare HMO | Attending: Emergency Medicine | Admitting: Emergency Medicine

## 2018-06-24 DIAGNOSIS — E119 Type 2 diabetes mellitus without complications: Secondary | ICD-10-CM | POA: Diagnosis not present

## 2018-06-24 DIAGNOSIS — I110 Hypertensive heart disease with heart failure: Secondary | ICD-10-CM | POA: Diagnosis not present

## 2018-06-24 DIAGNOSIS — R42 Dizziness and giddiness: Secondary | ICD-10-CM | POA: Diagnosis not present

## 2018-06-24 DIAGNOSIS — Z79899 Other long term (current) drug therapy: Secondary | ICD-10-CM | POA: Insufficient documentation

## 2018-06-24 DIAGNOSIS — I5032 Chronic diastolic (congestive) heart failure: Secondary | ICD-10-CM | POA: Diagnosis not present

## 2018-06-24 DIAGNOSIS — J449 Chronic obstructive pulmonary disease, unspecified: Secondary | ICD-10-CM | POA: Insufficient documentation

## 2018-06-24 DIAGNOSIS — R51 Headache: Secondary | ICD-10-CM | POA: Insufficient documentation

## 2018-06-24 DIAGNOSIS — R519 Headache, unspecified: Secondary | ICD-10-CM

## 2018-06-24 DIAGNOSIS — Z87891 Personal history of nicotine dependence: Secondary | ICD-10-CM | POA: Insufficient documentation

## 2018-06-24 MED ORDER — METOCLOPRAMIDE HCL 5 MG/ML IJ SOLN
10.0000 mg | Freq: Once | INTRAMUSCULAR | Status: AC
  Administered 2018-06-24: 10 mg via INTRAMUSCULAR
  Filled 2018-06-24: qty 2

## 2018-06-24 MED ORDER — MECLIZINE HCL 25 MG PO TABS
25.0000 mg | ORAL_TABLET | Freq: Once | ORAL | Status: AC
  Administered 2018-06-24: 25 mg via ORAL
  Filled 2018-06-24: qty 1

## 2018-06-24 NOTE — Discharge Instructions (Addendum)
Go to your MRI tomorrow as scheduled, and follow-up with Dr. Manuella Ghazi as scheduled.  Return to the ER for new, worsening, persistent severe headache, nausea vomiting, dizziness, weakness, or any other new or worsening symptoms that concern you.

## 2018-06-24 NOTE — ED Notes (Signed)
Spoke with MD Siadecki, no new orders at this time

## 2018-06-24 NOTE — ED Provider Notes (Signed)
[facility] Emergency Department Provider Note ____________________________________________   First MD Initiated Contact with Patient 06/24/18 1746     (approximate)  I have reviewed the triage vital signs and the nursing notes.   HISTORY  Chief Complaint Dizziness    HPI Penny Hart is a 72 y.o. female with PMH as noted below who presents with headache, intermittent course, starting on the top of her head and then radiating to the right side, and associated with a dizzy or "swimmy headed" feeling.  The patient states that these headaches have been occurring ever since she had a head injury this past August, but they have worsened in the last several days.  The patient previously had a CT in September after the injury, and is actually scheduled for an MRI tomorrow.  She denies associated nausea or vomiting.  She denies vision changes, fever, or other acute symptoms.  Past Medical History:  Diagnosis Date  . Arthritis   . CHF (congestive heart failure) ([place])   . COPD (chronic obstructive pulmonary disease) ([place])   . Diabetes mellitus without complication ([place])   . GERD (gastroesophageal reflux disease)   . Glaucoma   . Hiatal hernia   . Hyperlipidemia   . Hypertension   . Neuropathy   . Sleep apnea   . Spondylolisthesis of lumbar region     Patient Active Problem List   Diagnosis Date Noted  . Surgery, elective   . Chronic diastolic congestive heart failure ([place])   . Chronic obstructive pulmonary disease ([place])   . Diabetes mellitus type 2 in nonobese (HCC)   . Benign essential HTN   . Post-operative pain   . Hyponatremia   . Leukocytosis   . Spondylolisthesis of lumbar region 04/27/2017  . Blood in stool   . Hiatal hernia   . Benign neoplasm of cecum   . Benign neoplasm of transverse colon   . Diverticulosis of large intestine without diverticulitis   . Rectal bleeding 05/29/2015  . Non-intractable cyclical vomiting with nausea      Past Surgical History:  Procedure Laterality Date  . ABDOMINAL HYSTERECTOMY    . BREAST EXCISIONAL BIOPSY Left yrs ago    benign  . COLONOSCOPY WITH PROPOFOL N/A 05/30/2015   Procedure: COLONOSCOPY WITH PROPOFOL;  Surgeon: Lucilla Lame, MD;  Location: [facility] ENDOSCOPY;  Service: Endoscopy;  Laterality: N/A;  . ESOPHAGOGASTRODUODENOSCOPY (EGD) WITH PROPOFOL N/A 05/30/2015   Procedure: ESOPHAGOGASTRODUODENOSCOPY (EGD) WITH PROPOFOL;  Surgeon: Lucilla Lame, MD;  Location: [facility] ENDOSCOPY;  Service: Endoscopy;  Laterality: N/A;  . EYE SURGERY     Bialteral for glaucoma  . Eye surgery for glaucoma    . JOINT REPLACEMENT     Left  . Left total knee replacement    . OOPHORECTOMY    . POSTERIOR LUMBAR FUSION  04/27/2017  . TUBAL LIGATION      Prior to Admission medications   Medication Sig Start Date End Date Taking? Authorizing Provider  amLODipine (NORVASC) 5 MG tablet Take 1 tablet by mouth daily.    [provider]  cholecalciferol (VITAMIN D) 1000 units tablet Take 1,000 Units by mouth daily.    [provider]  docusate sodium (COLACE) 100 MG capsule Take 1 capsule (100 mg total) by mouth 2 (two) times daily. 05/02/17   Newman Pies, MD  furosemide (LASIX) 20 MG tablet Take 1 tablet by mouth daily.    [provider]  gabapentin (NEURONTIN) 100 MG capsule Take 200 mg by mouth  3 (three) times daily.     [provider]  hydrocortisone (ANUSOL-HC) 25 MG suppository Place 25 mg rectally 2 (two) times daily as needed for hemorrhoids or itching.    [provider]  HYDROmorphone (DILAUDID) 4 MG tablet Take 1 tablet (4 mg total) by mouth every 4 (four) hours as needed for severe pain. 05/10/17   Toni Arthurs, NP  metoprolol tartrate (LOPRESSOR) 25 MG tablet Take 12.5 mg by mouth 2 (two) times daily.     [provider]  omeprazole (PRILOSEC) 20 MG capsule Take 20 mg by mouth daily as needed.    [provider]  potassium  chloride SA (K-DUR,KLOR-CON) 20 MEQ tablet Take 1 tablet by mouth daily.    [provider]  pravastatin (PRAVACHOL) 10 MG tablet Take 1 tablet by mouth every evening.    [provider]  venlafaxine XR (EFFEXOR-XR) 75 MG 24 hr capsule Take 75 mg by mouth daily with breakfast.    [provider]    Allergies Oxycodone-acetaminophen and Tramadol  Family History  Problem Relation Age of Onset  . Diabetes Mother     Social History Social History   Tobacco Use  . Smoking status: Former Research scientist (life sciences)  . Smokeless tobacco: Never Used  Substance Use Topics  . Alcohol use: No  . Drug use: No    Review of Systems  Constitutional: No fever. Eyes: No visual changes. ENT: No neck pain. Cardiovascular: Denies chest pain. Respiratory: Denies shortness of breath. Gastrointestinal: No vomiting.  Genitourinary: Negative for flank pain.  Musculoskeletal: Negative for back pain. Skin: Negative for rash. Neurological: Positive for headache, negative for focal weakness or numbness.   ____________________________________________   PHYSICAL EXAM:  VITAL SIGNS: ED Triage Vitals  Enc Vitals Group     BP 06/24/18 1527 (!) 144/66     Pulse Rate 06/24/18 1527 62     Resp 06/24/18 1527 18     Temp 06/24/18 1527 98.6 F (37 C)     Temp Source 06/24/18 1527 Oral     SpO2 06/24/18 1527 94 %     Weight 06/24/18 1533 179 lb 14.3 oz (81.6 kg)     Height 06/24/18 1533 5\' 9"  (1.753 m)     Head Circumference --      Peak Flow --      Pain Score 06/24/18 1533 8     Pain Loc --      Pain Edu? --      Excl. in [place]? --     Constitutional: Alert and oriented. Well appearing and in no acute distress. Eyes: Conjunctivae are normal.  EOMI.  PERRLA. Head: Atraumatic. Nose: No congestion/rhinnorhea. Mouth/Throat: Mucous membranes are moist.   Neck: Normal range of motion.  Cardiovascular: Normal rate, regular rhythm. Grossly normal heart sounds.  Good peripheral  circulation. Respiratory: Normal respiratory effort.  No retractions. Lungs CTAB. Gastrointestinal: No distention.  Musculoskeletal: Extremities warm and well perfused.  Neurologic:  Normal speech and language.  Motor and sensory intact in all extremities.  Normal coordination with no ataxia on finger-to-nose.  Cranial nerves II through XII intact.  Skin:  Skin is warm and dry. No rash noted. Psychiatric: Mood and affect are normal. Speech and behavior are normal.  ____________________________________________   LABS (all labs ordered are listed, but only abnormal results are displayed)  Labs Reviewed - No data to display ____________________________________________  EKG   ____________________________________________  RADIOLOGY    ____________________________________________   PROCEDURES  Procedure(s) performed: No  Procedures  Critical Care performed: No ____________________________________________   INITIAL IMPRESSION / ASSESSMENT AND PLAN / ED COURSE  Pertinent labs & imaging results that were available during my care of the patient were reviewed by me and considered in my medical decision making (see chart for details).  72 year old female with PMH as noted above presents with intermittent headaches over the last several months, which have somewhat worsened in the last few days.  I reviewed the past medical records in Epic; the patient had a fall in August, and has been evaluated by Dr. Manuella Ghazi for likely concussion.  She had a negative CT head on 04/05/2018.  She had labs drawn on 06/12/2018 which were within normal limits.  On exam the patient is relatively well-appearing.  Her vital signs are normal except for borderline low heart rate.  Her neuro exam is normal.  Overall, as suspected by the neurologist I think that the patient's symptoms are likely postconcussive related.  Based on the chronicity of the symptoms and the normal neuro exam, there is no evidence of  intracranial hemorrhage or other concerning acute cause, and no indication for emergent imaging.  Because of the dizziness and the patient's age I offered to obtain repeat labs today, however the patient declines and states that she just wants something for the headache and would like to go home.  I will give a dose of Reglan and meclizine for symptomatic treatment.  The patient wants to go home after this.  I counseled her on return precautions.  She will go to the MRI tomorrow as scheduled. ____________________________________________   FINAL CLINICAL IMPRESSION(S) / ED DIAGNOSES  Final diagnoses:  Dizziness  Nonintractable episodic headache, unspecified headache type      NEW MEDICATIONS STARTED DURING THIS VISIT:  Discharge Medication List as of 06/24/2018  6:49 PM       Note:  This document was prepared using Dragon voice recognition software and may include unintentional dictation errors.   Arta Silence, MD 06/24/18 1905

## 2018-06-24 NOTE — ED Triage Notes (Signed)
Pt had mechanical fall 8/21 causing pt to hit her head, no loc. Pt states since then "my head is swimming- it feels like I am drunk." Pt was evaluated and had an negative CT. Pt states she has an MRI tomorrow but "cannot take it anymore."

## 2018-06-25 ENCOUNTER — Ambulatory Visit (HOSPITAL_COMMUNITY)
Admission: RE | Admit: 2018-06-25 | Discharge: 2018-06-25 | Disposition: A | Discharge status: Home / Self Care | Payer: Medicare HMO | Source: Ambulatory Visit | Attending: Neurology | Admitting: Neurology

## 2018-06-25 DIAGNOSIS — R9389 Abnormal findings on diagnostic imaging of other specified body structures: Secondary | ICD-10-CM | POA: Diagnosis not present

## 2018-06-25 DIAGNOSIS — R4189 Other symptoms and signs involving cognitive functions and awareness: Secondary | ICD-10-CM | POA: Diagnosis not present

## 2018-06-25 DIAGNOSIS — G44309 Post-traumatic headache, unspecified, not intractable: Secondary | ICD-10-CM | POA: Diagnosis not present

## 2018-06-25 DIAGNOSIS — R4020 Unspecified coma: Secondary | ICD-10-CM | POA: Diagnosis not present

## 2018-07-11 DIAGNOSIS — H401133 Primary open-angle glaucoma, bilateral, severe stage: Secondary | ICD-10-CM | POA: Diagnosis not present

## 2018-07-12 DIAGNOSIS — R2689 Other abnormalities of gait and mobility: Secondary | ICD-10-CM | POA: Diagnosis not present

## 2018-07-12 DIAGNOSIS — G44309 Post-traumatic headache, unspecified, not intractable: Secondary | ICD-10-CM | POA: Diagnosis not present

## 2018-07-31 ENCOUNTER — Ambulatory Visit: Payer: Medicare HMO

## 2018-08-07 ENCOUNTER — Ambulatory Visit: Payer: Medicare Other

## 2018-08-13 ENCOUNTER — Encounter: Payer: Self-pay | Admitting: Physical Therapy

## 2018-08-13 ENCOUNTER — Other Ambulatory Visit: Payer: Self-pay

## 2018-08-13 ENCOUNTER — Ambulatory Visit: Payer: Medicare Other | Attending: Neurology | Admitting: Physical Therapy

## 2018-08-13 VITALS — BP 145/69 | HR 56

## 2018-08-13 DIAGNOSIS — M6281 Muscle weakness (generalized): Secondary | ICD-10-CM | POA: Insufficient documentation

## 2018-08-13 DIAGNOSIS — Z9181 History of falling: Secondary | ICD-10-CM | POA: Insufficient documentation

## 2018-08-13 DIAGNOSIS — R293 Abnormal posture: Secondary | ICD-10-CM | POA: Diagnosis present

## 2018-08-14 ENCOUNTER — Ambulatory Visit: Payer: Medicare Other

## 2018-08-14 NOTE — Therapy (Addendum)
[place] MAIN [facility] SERVICES [address] [place], [place], [address] Phone: [phone]   Fax:  [phone]  Physical Therapy Evaluation  Patient Details  Name: Penny Hart MRN: [id number] Date of Birth: Nov 14, 1945 Referring Provider (PT): Jennings Books, MD   Encounter Date: 08/13/2018  PT End of Session - 08/13/18 1737    Visit Number  1    Number of Visits  17    Date for PT Re-Evaluation  10/09/18    Authorization Type  1/10 (Eval 08/13/18)    PT Start Time  1740    PT Stop Time  1833    PT Time Calculation (min)  53 min    Equipment Utilized During Treatment  Gait belt    Activity Tolerance  Patient tolerated treatment well;Patient limited by fatigue;Treatment limited secondary to medical complications (Comment)   CHF and COPD caused increased need for rest during testing   Behavior During Therapy  [facility] for tasks assessed/performed       Past Medical History:  Diagnosis Date  . Arthritis   . CHF (congestive heart failure) ([place])   . COPD (chronic obstructive pulmonary disease) ([place])   . Diabetes mellitus without complication ([place])   . GERD (gastroesophageal reflux disease)   . Glaucoma   . Hiatal hernia   . Hyperlipidemia   . Hypertension   . Neuropathy   . Sleep apnea   . Spondylolisthesis of lumbar region     Past Surgical History:  Procedure Laterality Date  . ABDOMINAL HYSTERECTOMY    . BREAST EXCISIONAL BIOPSY Left yrs ago    benign  . COLONOSCOPY WITH PROPOFOL N/A 05/30/2015   Procedure: COLONOSCOPY WITH PROPOFOL;  Surgeon: Lucilla Lame, MD;  Location: [facility] ENDOSCOPY;  Service: Endoscopy;  Laterality: N/A;  . ESOPHAGOGASTRODUODENOSCOPY (EGD) WITH PROPOFOL N/A 05/30/2015   Procedure: ESOPHAGOGASTRODUODENOSCOPY (EGD) WITH PROPOFOL;  Surgeon: Lucilla Lame, MD;  Location: [facility] ENDOSCOPY;  Service: Endoscopy;  Laterality: N/A;  . EYE SURGERY     Bialteral for glaucoma  . Eye surgery for glaucoma    . JOINT REPLACEMENT      Left  . Left total knee replacement    . OOPHORECTOMY    . POSTERIOR LUMBAR FUSION  04/27/2017  . TUBAL LIGATION      Vitals:   08/13/18 1749  BP: (!) 145/69  Pulse: (!) 56  SpO2: 98%     Subjective Assessment - 08/13/18 1749    Subjective  Patient fell March 17, 2018 going down steps. Body was hurting all down the R side. The circumstances of the fall were it had been raining and her surroundings were dark, she thought she was on the last step and then she fell into the wall. She required stitches above her R eye. Patient continues to have headaches and pain at the top of the head. She states that she feels like she's had "whiskey" with her dizziness.  Patient hit her head again on the L side in November when she had a cabinet door open.  Patient reports increased fatigue and states that some days she spends most of her time sleeping in bed.   Patient is accompained by:  Family member    Pertinent History  Co-morbidities: COPD, CHF (diastolic), OA, glaucoma, DM, neuropathy, HTN, hyperlipidemia; currently taking beta-blocker therefore dampened HR response to activity; surgical hx for lumbar fusion, L TKA    Limitations  Lifting;Standing;Walking;House hold activities;Reading    Diagnostic tests  MRI: No acute or  reversible finding. Mild small vessel change of the cerebral hemispheric white matter.    Patient Stated Goals  get rid of headaches, not feel dizzy    Currently in Pain?  Yes    Pain Score  6     Pain Location  Head    Pain Orientation  Anterior;Posterior;Right;Left;Medial    Pain Descriptors / Indicators  Aching    Pain Type  Chronic pain    Pain Onset  More than a month ago    Pain Frequency  Constant    Pain Relieving Factors  medicine, sleeping      Onset: August 2019 Imaging: MRI negative for significant changes.  Recent changes in overall health/medication: No Directional pattern for falls: None reported.  Prior history of physical therapy for balance:  None Follow-up appointment with MD: None scheduled. Told to return in 12/2018.  Red flags (bowel/bladder changes, saddle paresthesia, personal history of cancer, chills/fever, night sweats, unrelenting pain) Patient has chronic nausea, headache, and night sweats. Patient reports compliance with pharmaceutical management as prescribed.  OBJECTIVE  MUSCULOSKELETAL: Tremor: Absent Bulk: Normal Tone: Normal, no clonus  Posture Generally slumped posture in sitting. Slightly increased lordosis in standing.   Gait Patient's RLE is externally rotated throughout gait cycle; decreased DF eccentric control after heel strike bilaterally. Patient relies on joint congruency for stability throughout gait cycle. With increased time in standing and walking, patient begins to become unsteady, drifting to the R>L, adopting a wider BOS, and taking more varied strides.  Strength R/L 3+/3+ Hip flexion 4/4 Hip abduction (seated) 4/4 Hip adduction (seated) 3+/3+ Knee extension 3+/3+ Knee flexion 4/4 Ankle Plantarflexion (seated) 4/4 Ankle Dorsiflexion     NEUROLOGICAL:  Mental Status Patient is oriented to person, place and time.  Recent memory is intact; patient requires verbal cues from her daughter for information accuracy. Remote memory is intact.  Attention span and concentration are intact.  Expressive speech is intact.  Patient's fund of knowledge is within normal limits for educational level.  Sensation Grossly intact to light touch bilateral UEs/LEs as determined by testing dermatomes L2-S2 respectively Proprioception and hot/cold testing deferred on this date.   [facility] PT Assessment - 08/14/18 0001      Assessment   Medical Diagnosis  Impaired Balance    Referring Provider (PT)  Jennings Books, MD    Onset Date/Surgical Date  03/17/18    Hand Dominance  Left    Next MD Visit  May 2020    Prior Therapy  for knee replacement and back, but not balance      Precautions   Precautions   Fall      Restrictions   Weight Bearing Restrictions  No      Balance Screen   Has the patient fallen in the past 6 months  Yes    How many times?  1    Has the patient had a decrease in activity level because of a fear of falling?   Yes    Is the patient reluctant to leave their home because of a fear of falling?   Yes      [place]  Private residence    [place] Access  Level entry      Prior Function   Level of Independence  Independent      Cognition   Overall Cognitive Status  Within Functional Limits for tasks assessed    Memory  Appears intact  Patient requires verbal cues from daughter for answers     Standardized Balance Assessment   Five times sit to stand comments   19.7 sec, no UE support    10 Meter Walk  0.92 m/s self-selected; 1.08 m/s fast      Dynamic Gait Index   Level Surface  Normal    Change in Gait Speed  Mild Impairment    Gait with Horizontal Head Turns  Mild Impairment    Gait with Vertical Head Turns  Moderate Impairment    Gait and Pivot Turn  Moderate Impairment    Step Over Obstacle  Mild Impairment    Step Around Obstacles  Mild Impairment    Steps  Mild Impairment    Total Score  15    DGI comment:  Falls Risk      Timed Up and Go Test   Normal TUG (seconds)  12.1      Patient was provided a seated rest break between all functional assessments. During DGI, patient made it through first five tasks before having increased headache and dizziness and demonstrating unsteadiness requiring external support. Patient was provided a seated rest break and vitals were taken as noted above to be 145/69, with HR 56 bpm. Patient was provided with five minutes of seated calm rest with monitoring of symptoms and vitals taken again to be 130/69 with HR 51 bpm. Patient reported decrease in dizziness and testing was continued with close monitoring of symptoms.   Objective measurements completed on  examination: See above findings.     PT Education - 08/14/18 1346    Education Details  POC, prognosis, s/s to monitor (BP, sudden changes to strength, difficulty with speech)    Person(s) Educated  Patient;Child(ren)    Methods  Explanation    Comprehension  Verbalized understanding;Need further instruction       PT Short Term Goals - 08/14/18 1352      PT SHORT TERM GOAL #1   Title  Patient will be independent with HEP for improved therapeutic gains and decreased risk of falls.    Baseline  IE: not provided    Time  4    Period  Weeks    Status  New    Target Date  09/11/18        PT Long Term Goals - 08/14/18 1407      PT LONG TERM GOAL #1   Title  Pt will improve DGI by at least 3 points in order to demonstrate clinically significant improvement in balance and decreased risk for falls.    Baseline  IE: 15/24    Time  8    Period  Weeks    Status  New    Target Date  10/09/18      PT LONG TERM GOAL #2   Title  Pt will decrease DHI score by at least 18 points in order to demonstrate clinically significant reduction in disability.     Baseline  IE: not taken 2/2 to fatigue/headache    Time  8    Period  Weeks    Status  New    Target Date  10/09/18      PT LONG TERM GOAL #3   Title  Patient will require no external support to maintain balance when walking for greater than 5 minutes in order to improve safety in the community and decrease risk of falls.     Baseline  IE: patient had LOB at 3 minutes, required PT  support and seated rest break to decrease dizziness;    Time  8    Period  Weeks    Status  New    Target Date  10/09/18      PT LONG TERM GOAL #4   Title  Pt will decrease 5TSTS by at least 3 seconds in order to demonstrate clinically significant improvement in LE strength and decrease risk of recurrent falls.   Baseline  IE: 19.7 sec, UE assist on thighs   Time  8    Period  Weeks    Status  New    Target Date  10/09/18             Plan -  08/14/18 1348    Clinical Impression Statement  Patient is a delightful 73 year old female presenting to clinic with chief complaints of persistent headache and dizziness related imbalance during activity s/p fall in August 2019. PT evaluation revealed deficits in strength (5TSTS 19.7 sec, increased risk for falls), balance (DGI 15/24 high risk for falls), pain (6/10 constant headache), and activity tolerance ( elevated BP response to ~3 min of standing activity during DGI). Patient's balance impairments presented in correlation to increase in activity and in the presence of shortness of breath, increase in headache, increase in dizziness, and increase in BP (145/69) with no notable increase in HR (56 bpm). Patient demonstrates deficits that will benefit from skilled therapeutic intervention with close monitoring of her cardiovascular response to activity to decrease her risk of falls and ensure safety and appropriateness for intervention. Patient's PMH significant for CHF, COPD, HTN, DM, and glaucoma may limit her progress at this time; however patient is highly motivated and has strong family support to her benefit.   History and Personal Factors relevant to plan of care:  (+) motivated, social support, active and independent, (-) comorbidities: HTN, hyperlipidemia, DM, chronicity of headaches, COPD, CHF, glaucoma, polypharmacy, decreased awareness of limitations    Clinical Presentation  Evolving    Clinical Presentation due to:  Moderate (evolving): 1-2 personal factors/comorbidities, 3 or more body systems/activity limitations/participation restrictions      Clinical Decision Making  Moderate    Rehab Potential  Fair    PT Frequency  2x / week    PT Duration  8 weeks    PT Treatment/Interventions  ADLs/Self Care Home Management;Canalith Repostioning;Cryotherapy;Moist Heat;Gait training;Stair training;Functional mobility training;Therapeutic activities;Therapeutic exercise;Balance training;Neuromuscular  re-education;Patient/family education;Manual techniques;Dry needling;Scar mobilization;Passive range of motion;Energy conservation;Taping;Vestibular    PT Next Visit Plan  BP/HR response to activity, administer DHI, [place], 6MWT    PT Home Exercise Plan  none provided at this time    Consulted and Agree with Plan of Care  Patient;Family member/caregiver    Family Member Consulted  Daughter       Patient will benefit from skilled therapeutic intervention in order to improve the following deficits and impairments:  Abnormal gait, Dizziness, Improper body mechanics, Pain, Decreased coordination, Decreased mobility, Postural dysfunction, Decreased strength, Decreased endurance, Decreased activity tolerance, Difficulty walking, Decreased safety awareness, Decreased balance, Impaired vision/preception, Impaired sensation, Cardiopulmonary status limiting activity  Visit Diagnosis: History of falling  Abnormal posture  Muscle weakness (generalized)     Problem List Patient Active Problem List   Diagnosis Date Noted  . Surgery, elective   . Chronic diastolic congestive heart failure ([place])   . Chronic obstructive pulmonary disease ([place])   . Diabetes mellitus type 2 in nonobese (HCC)   . Benign essential HTN   . Post-operative pain   .  Hyponatremia   . Leukocytosis   . Spondylolisthesis of lumbar region 04/27/2017  . Blood in stool   . Hiatal hernia   . Benign neoplasm of cecum   . Benign neoplasm of transverse colon   . Diverticulosis of large intestine without diverticulitis   . Rectal bleeding 05/29/2015  . Non-intractable cyclical vomiting with nausea     Myles Gip PT, DPT [phone] 08/14/2018, 2:46 PM  [place] MAIN [facility] SERVICES [address] [place], [place], [address] Phone: [phone]   Fax:  [phone]  Name: Penny Hart MRN: [id number] Date of Birth: September 22, 1945

## 2018-08-16 ENCOUNTER — Ambulatory Visit: Payer: Medicare Other

## 2018-08-16 DIAGNOSIS — M6281 Muscle weakness (generalized): Secondary | ICD-10-CM

## 2018-08-16 DIAGNOSIS — Z9181 History of falling: Secondary | ICD-10-CM | POA: Diagnosis not present

## 2018-08-16 DIAGNOSIS — R293 Abnormal posture: Secondary | ICD-10-CM

## 2018-08-16 NOTE — Therapy (Signed)
[place] MAIN [facility] SERVICES [address] [place], [place], [address] Phone: [phone]   Fax:  [phone]  Physical Therapy Treatment  Patient Details  Name: Penny Hart MRN: [id number] Date of Birth: 1945-11-05 Referring Provider (PT): Jennings Books, MD   Encounter Date: 08/16/2018  PT End of Session - 08/16/18 1028    Visit Number  2    Number of Visits  17    Date for PT Re-Evaluation  10/09/18    Authorization Type  2/10 (Eval 08/13/18)    PT Start Time  1016    PT Stop Time  1100    PT Time Calculation (min)  44 min    Equipment Utilized During Treatment  Gait belt    Activity Tolerance  Patient tolerated treatment well;Patient limited by fatigue;Treatment limited secondary to medical complications (Comment)   CHF and COPD caused increased need for rest during testing   Behavior During Therapy  [facility] for tasks assessed/performed       Past Medical History:  Diagnosis Date  . Arthritis   . CHF (congestive heart failure) ([place])   . COPD (chronic obstructive pulmonary disease) ([place])   . Diabetes mellitus without complication ([place])   . GERD (gastroesophageal reflux disease)   . Glaucoma   . Hiatal hernia   . Hyperlipidemia   . Hypertension   . Neuropathy   . Sleep apnea   . Spondylolisthesis of lumbar region     Past Surgical History:  Procedure Laterality Date  . ABDOMINAL HYSTERECTOMY    . BREAST EXCISIONAL BIOPSY Left yrs ago    benign  . COLONOSCOPY WITH PROPOFOL N/A 05/30/2015   Procedure: COLONOSCOPY WITH PROPOFOL;  Surgeon: Lucilla Lame, MD;  Location: [facility] ENDOSCOPY;  Service: Endoscopy;  Laterality: N/A;  . ESOPHAGOGASTRODUODENOSCOPY (EGD) WITH PROPOFOL N/A 05/30/2015   Procedure: ESOPHAGOGASTRODUODENOSCOPY (EGD) WITH PROPOFOL;  Surgeon: Lucilla Lame, MD;  Location: [facility] ENDOSCOPY;  Service: Endoscopy;  Laterality: N/A;  . EYE SURGERY     Bialteral for glaucoma  . Eye surgery for glaucoma    . JOINT REPLACEMENT      Left  . Left total knee replacement    . OOPHORECTOMY    . POSTERIOR LUMBAR FUSION  04/27/2017  . TUBAL LIGATION      There were no vitals filed for this visit.  Subjective Assessment - 08/16/18 1020    Subjective  Patient reports headache last night, but not this morning. No pain. Reports she hasn't moved much since her eval. Reports no stumbles or falls since last session.     Patient is accompained by:  Family member    Pertinent History  Co-morbidities: COPD, CHF (diastolic), OA, glaucoma, DM, neuropathy, HTN, hyperlipidemia; currently taking beta-blocker therefore dampened HR response to activity; surgical hx for lumbar fusion, L TKA    Limitations  Lifting;Standing;Walking;House hold activities;Reading    Diagnostic tests  MRI: No acute or reversible finding. Mild small vessel change of the cerebral hemispheric white matter.    Patient Stated Goals  get rid of headaches, not feel dizzy    Currently in Pain?  No/denies           [facility] PT Assessment - 08/16/18 0001      Standardized Balance Assessment   Standardized Balance Assessment  Berg Balance Test      Berg Balance Test   Sit to Stand  Able to stand  independently using hands    Standing Unsupported  Able to stand safely  2 minutes    Sitting with Back Unsupported but Feet Supported on Floor or Stool  Able to sit safely and securely 2 minutes    Stand to Sit  Sits safely with minimal use of hands    Transfers  Able to transfer safely, minor use of hands    Standing Unsupported with Eyes Closed  Able to stand 10 seconds with supervision    Standing Ubsupported with Feet Together  Able to place feet together independently and stand for 1 minute with supervision    From Standing, Reach Forward with Outstretched Arm  Can reach forward >5 cm safely (2")    From Standing Position, Pick up Object from [place] to pick up shoe safely and easily    From Standing Position, Turn to Look Behind Over each Shoulder  Looks behind  from both sides and weight shifts well    Turn 360 Degrees  Able to turn 360 degrees safely in 4 seconds or less    Standing Unsupported, Alternately Place Feet on Step/Stool  Able to complete >2 steps/needs minimal assist    Standing Unsupported, One Foot in Front  Able to take small step independently and hold 30 seconds    Standing on One Leg  Tries to lift leg/unable to hold 3 seconds but remains standing independently    Total Score  43       Vitals: 139/69 pulse 54 at start of session    DHI: 63: moderate perceived disability  Berg:43/56       Neuro Re-ed: all neuro re-ed performed with CGA to increase stability and challenge ankle righting reactions on stable and unstable surfaces for carryover to patient's natural environment to decrease falls risk.   Airex pad: horizontal head turns 2x30 seconds, vertical head turns 2x 30 seconds (HR dropped to 47)   Ambulating in hallway: CGA with additional touch on shoulder for stability  Horizontal head turns 2x 86 ft  Vertical head turns: 2ft: increased dizziness.   Ambulate without head turns once fatigued: dragging of R foot noted and audible "popping" of L knee.    Vitals after walking: 126/71 pulse 52   Step over and back orange hurdle SUE support 10x each LE. Pulse started at 48 and peaked at 78  Side step over and back orange hurdle: SUE support, 10x each LE, CGA:   Patient is challenged with reading requiring additional time for filling out [place]. Vitals monitored and stayed within functional range throughout the session.                      PT Education - 08/16/18 1028    Education Details  exercise technique, stability, outcome measures    Person(s) Educated  Patient    Methods  Explanation;Demonstration;Tactile cues;Verbal cues    Comprehension  Verbalized understanding;Returned demonstration;Verbal cues required;Tactile cues required;Need further instruction       PT Short Term Goals - 08/14/18  1352      PT SHORT TERM GOAL #1   Title  Patient will be independent with HEP for improved therapeutic gains and decreased risk of falls.    Baseline  IE: not provided    Time  4    Period  Weeks    Status  New    Target Date  09/11/18        PT Long Term Goals - 08/16/18 1219      PT LONG TERM GOAL #1   Title  Pt  will improve DGI by at least 3 points in order to demonstrate clinically significant improvement in balance and decreased risk for falls.    Baseline  IE: 15/24    Time  8    Period  Weeks    Status  New    Target Date  10/09/18      PT LONG TERM GOAL #2   Title  Pt will decrease DHI score by at least 18 points (45 points) in order to demonstrate clinically significant reduction in disability.     Baseline  IE: not taken 2/2 to fatigue/headache 1/23: 63    Time  8    Period  Weeks    Status  New    Target Date  10/09/18      PT LONG TERM GOAL #3   Title  Patient will require no external support to maintain balance when walking for greater than 5 minutes in order to improve safety in the community and decrease risk of falls.     Baseline  IE: patient had LOB at 3 minutes, required PT support and seated rest break to decrease dizziness;    Time  8    Period  Weeks    Status  New    Target Date  10/09/18      PT LONG TERM GOAL #4   Title  Pt will decrease 5TSTS by at least 3 seconds in order to demonstrate clinically significant improvement in LE strength and decrease risk of recurrent falls.    Baseline  IE: 19.7 sec, UE assist on thighs    Time  8    Period  Weeks    Status  New    Target Date  10/09/18            Plan - 08/16/18 1218    Clinical Impression Statement  Patient is challenged with reading requiring additional time for filling out [place]. Vitals monitored and stayed within functional range throughout the session.  Patient fatigues quickly in standing and requires seated rest breaks between interventions. Patient will benefit from skilled  physical therapy to increase stability, decrease falls risk and dizziness, and return to prior level of function.     Rehab Potential  Fair    PT Frequency  2x / week    PT Duration  8 weeks    PT Treatment/Interventions  ADLs/Self Care Home Management;Canalith Repostioning;Cryotherapy;Moist Heat;Gait training;Stair training;Functional mobility training;Therapeutic activities;Therapeutic exercise;Balance training;Neuromuscular re-education;Patient/family education;Manual techniques;Dry needling;Scar mobilization;Passive range of motion;Energy conservation;Taping;Vestibular    PT Next Visit Plan  standing exercise, monitor vitals    PT Home Exercise Plan  none provided at this time    Consulted and Agree with Plan of Care  Patient;Family member/caregiver    Family Member Consulted  Daughter       Patient will benefit from skilled therapeutic intervention in order to improve the following deficits and impairments:  Abnormal gait, Dizziness, Improper body mechanics, Pain, Decreased coordination, Decreased mobility, Postural dysfunction, Decreased strength, Decreased endurance, Decreased activity tolerance, Difficulty walking, Decreased safety awareness, Decreased balance, Impaired vision/preception, Impaired sensation, Cardiopulmonary status limiting activity  Visit Diagnosis: History of falling  Abnormal posture  Muscle weakness (generalized)     Problem List Patient Active Problem List   Diagnosis Date Noted  . Surgery, elective   . Chronic diastolic congestive heart failure ([place])   . Chronic obstructive pulmonary disease ([place])   . Diabetes mellitus type 2 in nonobese ([facility])   . Benign essential HTN   .  Post-operative pain   . Hyponatremia   . Leukocytosis   . Spondylolisthesis of lumbar region 04/27/2017  . Blood in stool   . Hiatal hernia   . Benign neoplasm of cecum   . Benign neoplasm of transverse colon   . Diverticulosis of large intestine without diverticulitis   .  Rectal bleeding 05/29/2015  . Non-intractable cyclical vomiting with nausea   Janna Arch, PT, DPT    08/16/2018, 12:20 PM  [place] MAIN [facility] SERVICES [address] [place], [place], [address] Phone: [phone]   Fax:  [phone]  Name: Penny Hart MRN: [id number] Date of Birth: [birth year]

## 2018-08-21 ENCOUNTER — Ambulatory Visit: Payer: Medicare Other

## 2018-08-23 ENCOUNTER — Ambulatory Visit: Payer: Medicare Other

## 2018-08-23 DIAGNOSIS — R293 Abnormal posture: Secondary | ICD-10-CM

## 2018-08-23 DIAGNOSIS — Z9181 History of falling: Secondary | ICD-10-CM

## 2018-08-23 DIAGNOSIS — M6281 Muscle weakness (generalized): Secondary | ICD-10-CM

## 2018-08-23 NOTE — Therapy (Signed)
[place] MAIN [facility] SERVICES [address] [place], [place], [address] Phone: [phone]   Fax:  [phone]  Physical Therapy Treatment  Patient Details  Name: Penny Hart MRN: [id number] Date of Birth: 27-Apr-1946 Referring Provider (PT): Jennings Books, MD   Encounter Date: 08/23/2018  PT End of Session - 08/23/18 1119    Visit Number  3    Number of Visits  17    Date for PT Re-Evaluation  10/09/18    Authorization Type  2/10 (Eval 08/13/18)    PT Start Time  1102    PT Stop Time  1142    PT Time Calculation (min)  40 min    Activity Tolerance  Patient tolerated treatment well;Patient limited by fatigue;Treatment limited secondary to medical complications (Comment)    Behavior During Therapy  [facility] for tasks assessed/performed       Past Medical History:  Diagnosis Date  . Arthritis   . CHF (congestive heart failure) ([place])   . COPD (chronic obstructive pulmonary disease) ([place])   . Diabetes mellitus without complication ([place])   . GERD (gastroesophageal reflux disease)   . Glaucoma   . Hiatal hernia   . Hyperlipidemia   . Hypertension   . Neuropathy   . Sleep apnea   . Spondylolisthesis of lumbar region     Past Surgical History:  Procedure Laterality Date  . ABDOMINAL HYSTERECTOMY    . BREAST EXCISIONAL BIOPSY Left yrs ago    benign  . COLONOSCOPY WITH PROPOFOL N/A 05/30/2015   Procedure: COLONOSCOPY WITH PROPOFOL;  Surgeon: Lucilla Lame, MD;  Location: [facility] ENDOSCOPY;  Service: Endoscopy;  Laterality: N/A;  . ESOPHAGOGASTRODUODENOSCOPY (EGD) WITH PROPOFOL N/A 05/30/2015   Procedure: ESOPHAGOGASTRODUODENOSCOPY (EGD) WITH PROPOFOL;  Surgeon: Lucilla Lame, MD;  Location: [facility] ENDOSCOPY;  Service: Endoscopy;  Laterality: N/A;  . EYE SURGERY     Bialteral for glaucoma  . Eye surgery for glaucoma    . JOINT REPLACEMENT     Left  . Left total knee replacement    . OOPHORECTOMY    . POSTERIOR LUMBAR FUSION  04/27/2017  . TUBAL  LIGATION      There were no vitals filed for this visit.  Subjective Assessment - 08/23/18 1117    Subjective  Pt is doing well tis date. Reports she misplaced her cane. She says noe HEP has been established. Pain in HA is similar this date.     Pertinent History  Co-morbidities: COPD, CHF (diastolic), OA, glaucoma, DM, neuropathy, HTN, hyperlipidemia; currently taking beta-blocker therefore dampened HR response to activity; surgical hx for lumbar fusion, L TKA    Currently in Pain?  Yes    Pain Score  5     Pain Location  Head    Pain Orientation  Right;Anterior;Posterior;Lateral    Pain Onset  More than a month ago          Intervention this date: -AMB intervals 2x350ft, 60 sec rest break  *Resting HR 58bpm, terminal HR 64 BPM  *VC for Right heel strike  *AVG 0.21m/s  -Myofascial screening for trigger point contribution to Right HA:  -Rt upper trap, painful not symptomatic  -Rt SCM, painful not symptomatic  -Rt temporalis, painful not symptomatic  -Right orbicularis orsis, painful, partially symptomatic, decrease HA after release.   -capital extensors, suboccipital group painful not symptomatic  -Rt scalenes  group, painful with referral HA, not symptomatic  -Rt splenius cervisus, painful, asymptomatic, goo response to release after 60sec   -  SLR: 1x10 bilat -Reverse Curl-up (double bent knee raise): 1x10  -Hooklying bridge: 1x10  -Normal Stance Airex Pad: 15x hor head turns bilat alternating, 10x vertical head turns -STS 1x10 from chair +airex hands free (quite difficult)     PT Short Term Goals - 08/14/18 1352      PT SHORT TERM GOAL #1   Title  Patient will be independent with HEP for improved therapeutic gains and decreased risk of falls.    Baseline  IE: not provided    Time  4    Period  Weeks    Status  New    Target Date  09/11/18        PT Long Term Goals - 08/16/18 1219      PT LONG TERM GOAL #1   Title  Pt will improve DGI by at least 3 points in  order to demonstrate clinically significant improvement in balance and decreased risk for falls.    Baseline  IE: 15/24    Time  8    Period  Weeks    Status  New    Target Date  10/09/18      PT LONG TERM GOAL #2   Title  Pt will decrease DHI score by at least 18 points (45 points) in order to demonstrate clinically significant reduction in disability.     Baseline  IE: not taken 2/2 to fatigue/headache 1/23: 63    Time  8    Period  Weeks    Status  New    Target Date  10/09/18      PT LONG TERM GOAL #3   Title  Patient will require no external support to maintain balance when walking for greater than 5 minutes in order to improve safety in the community and decrease risk of falls.     Baseline  IE: patient had LOB at 3 minutes, required PT support and seated rest break to decrease dizziness;    Time  8    Period  Weeks    Status  New    Target Date  10/09/18      PT LONG TERM GOAL #4   Title  Pt will decrease 5TSTS by at least 3 seconds in order to demonstrate clinically significant improvement in LE strength and decrease risk of recurrent falls.    Baseline  IE: 19.7 sec, UE assist on thighs    Time  8    Period  Weeks    Status  New    Target Date  10/09/18            Plan - 08/23/18 1146    Clinical Impression Statement  Continued with strength and balance interventions guided by findings from evalaution. Indepth screening this date of cervical and head musculature, all of which is incredible tender, but only one area positive for reproduction of symptomatic HA. Of note, pt has no cervical tenderness on Lt side when compared. Pt responds well to mysofascial relase to Rt orbicularis oculi and corrugator supercilii. Pt reports no HA at end of session, but does have mild dizziness after termination of supine fo reainder of session. No overt LOB in session, but gait motor control easily limited by quick onset fatigue of Right hip musculature.     Rehab Potential  Fair    PT  Frequency  2x / week    PT Duration  8 weeks    PT Treatment/Interventions  ADLs/Self Care Home Management;Canalith Repostioning;Cryotherapy;Moist Heat;Gait training;Stair training;Functional mobility  training;Therapeutic activities;Therapeutic exercise;Balance training;Neuromuscular re-education;Patient/family education;Manual techniques;Dry needling;Scar mobilization;Passive range of motion;Energy conservation;Taping;Vestibular    PT Next Visit Plan  standing exercise, monitor vitals, revisit myofascial work to corrugator supercilii for HA managment (retest)     PT Home Exercise Plan  Establish HEP at visit 4 if appropriate    Consulted and Agree with Plan of Care  Patient       Patient will benefit from skilled therapeutic intervention in order to improve the following deficits and impairments:  Abnormal gait, Dizziness, Improper body mechanics, Pain, Decreased coordination, Decreased mobility, Postural dysfunction, Decreased strength, Decreased endurance, Decreased activity tolerance, Difficulty walking, Decreased safety awareness, Decreased balance, Impaired vision/preception, Impaired sensation, Cardiopulmonary status limiting activity  Visit Diagnosis: History of falling  Abnormal posture  Muscle weakness (generalized)     Problem List Patient Active Problem List   Diagnosis Date Noted  . Surgery, elective   . Chronic diastolic congestive heart failure ([place])   . Chronic obstructive pulmonary disease ([place])   . Diabetes mellitus type 2 in nonobese ([facility])   . Benign essential HTN   . Post-operative pain   . Hyponatremia   . Leukocytosis   . Spondylolisthesis of lumbar region 04/27/2017  . Blood in stool   . Hiatal hernia   . Benign neoplasm of cecum   . Benign neoplasm of transverse colon   . Diverticulosis of large intestine without diverticulitis   . Rectal bleeding 05/29/2015  . Non-intractable cyclical vomiting with nausea    11:55 AM, 08/23/18 Etta Grandchild, PT,  DPT Physical Therapist - [place] [phone]     Etta Grandchild 08/23/2018, 11:54 AM  [place] MAIN [facility] SERVICES [address] [place], [place], [address] Phone: [phone]   Fax:  [phone]  Name: Penny Hart MRN: [id number] Date of Birth: 05-Apr-1946

## 2018-08-28 ENCOUNTER — Ambulatory Visit: Payer: Medicare Other | Attending: Neurology

## 2018-08-28 DIAGNOSIS — M6281 Muscle weakness (generalized): Secondary | ICD-10-CM | POA: Diagnosis present

## 2018-08-28 DIAGNOSIS — R293 Abnormal posture: Secondary | ICD-10-CM | POA: Diagnosis present

## 2018-08-28 DIAGNOSIS — Z9181 History of falling: Secondary | ICD-10-CM | POA: Diagnosis not present

## 2018-08-28 NOTE — Therapy (Signed)
[place] MAIN [facility] SERVICES [address] [place], [place], [address] Phone: [phone]   Fax:  [phone]  Physical Therapy Treatment  Patient Details  Name: Penny Hart MRN: [id number] Date of Birth: Jan 05, 1946 Referring Provider (PT): Jennings Books, MD   Encounter Date: 08/28/2018  PT End of Session - 08/28/18 1131    Visit Number  4    Number of Visits  17    Date for PT Re-Evaluation  10/09/18    Authorization Type  2/10 (Eval 08/13/18)       Past Medical History:  Diagnosis Date  . Arthritis   . CHF (congestive heart failure) ([place])   . COPD (chronic obstructive pulmonary disease) ([place])   . Diabetes mellitus without complication ([place])   . GERD (gastroesophageal reflux disease)   . Glaucoma   . Hiatal hernia   . Hyperlipidemia   . Hypertension   . Neuropathy   . Sleep apnea   . Spondylolisthesis of lumbar region     Past Surgical History:  Procedure Laterality Date  . ABDOMINAL HYSTERECTOMY    . BREAST EXCISIONAL BIOPSY Left yrs ago    benign  . COLONOSCOPY WITH PROPOFOL N/A 05/30/2015   Procedure: COLONOSCOPY WITH PROPOFOL;  Surgeon: Lucilla Lame, MD;  Location: [facility] ENDOSCOPY;  Service: Endoscopy;  Laterality: N/A;  . ESOPHAGOGASTRODUODENOSCOPY (EGD) WITH PROPOFOL N/A 05/30/2015   Procedure: ESOPHAGOGASTRODUODENOSCOPY (EGD) WITH PROPOFOL;  Surgeon: Lucilla Lame, MD;  Location: [facility] ENDOSCOPY;  Service: Endoscopy;  Laterality: N/A;  . EYE SURGERY     Bialteral for glaucoma  . Eye surgery for glaucoma    . JOINT REPLACEMENT     Left  . Left total knee replacement    . OOPHORECTOMY    . POSTERIOR LUMBAR FUSION  04/27/2017  . TUBAL LIGATION      There were no vitals filed for this visit.      Intervention this date: -Gait training 654ft overground, low carpet, VC for right heel strike, worsening Right hip lateral stability after 414ft, with painful compensated Trendelenburg last 160ft.  -Lt side-lying Rt hip  abduction, modAssist for form and weakness: 3x10 -Rt glute med MFR 5 pointsx60sec each  -Right SKTC stretch 2x30sec -Reverse Curl-up (double bent knee raise): 1x10  -Hooklying bridge: 1x10  -Normal Stance Airex Pad: 10x hor head turns bilat alternating, 10x vertical head turns -STS 1x10 from chair +airex hands on knees  -FWD step up and over airex +360degree turn 1x10 -Lateral step up and over airex 1x10 -eyes closed on airex pad, (semi narrow) 3x30sec       PT Short Term Goals - 08/14/18 1352      PT SHORT TERM GOAL #1   Title  Patient will be independent with HEP for improved therapeutic gains and decreased risk of falls.    Baseline  IE: not provided    Time  4    Period  Weeks    Status  New    Target Date  09/11/18        PT Long Term Goals - 08/16/18 1219      PT LONG TERM GOAL #1   Title  Pt will improve DGI by at least 3 points in order to demonstrate clinically significant improvement in balance and decreased risk for falls.    Baseline  IE: 15/24    Time  8    Period  Weeks    Status  New    Target Date  10/09/18  PT LONG TERM GOAL #2   Title  Pt will decrease DHI score by at least 18 points (45 points) in order to demonstrate clinically significant reduction in disability.     Baseline  IE: not taken 2/2 to fatigue/headache 1/23: 63    Time  8    Period  Weeks    Status  New    Target Date  10/09/18      PT LONG TERM GOAL #3   Title  Patient will require no external support to maintain balance when walking for greater than 5 minutes in order to improve safety in the community and decrease risk of falls.     Baseline  IE: patient had LOB at 3 minutes, required PT support and seated rest break to decrease dizziness;    Time  8    Period  Weeks    Status  New    Target Date  10/09/18      PT LONG TERM GOAL #4   Title  Pt will decrease 5TSTS by at least 3 seconds in order to demonstrate clinically significant improvement in LE strength and decrease  risk of recurrent falls.    Baseline  IE: 19.7 sec, UE assist on thighs    Time  8    Period  Weeks    Status  New    Target Date  10/09/18            Plan - 08/28/18 1132    Clinical Impression Statement  Pt reports feeling more sleepy this date, which she atributes to her medications, but she cannot say which ones specifically. Noted worsening of Right glute medius function afte r472ft, wth onset pain, adressed with myofascial release, and targeted strengthening. Continued to work on areas of strength defiicts to improve trunk and hip control during AMB over communiy distances. Pt c/o dizziness after transisiton from supine/sidelying back to sitting, she says a lightheadedness feeling.     Rehab Potential  Fair    PT Frequency  2x / week    PT Duration  8 weeks    PT Treatment/Interventions  ADLs/Self Care Home Management;Canalith Repostioning;Cryotherapy;Moist Heat;Gait training;Stair training;Functional mobility training;Therapeutic activities;Therapeutic exercise;Balance training;Neuromuscular re-education;Patient/family education;Manual techniques;Dry needling;Scar mobilization;Passive range of motion;Energy conservation;Taping;Vestibular    PT Next Visit Plan  standing exercise, monitor vitals, revisit myofascial work to corrugator supercilii for HA managment (retest)     PT Home Exercise Plan  Establish HEP at visit 4 if appropriate    Consulted and Agree with Plan of Care  Patient       Patient will benefit from skilled therapeutic intervention in order to improve the following deficits and impairments:  Abnormal gait, Dizziness, Improper body mechanics, Pain, Decreased coordination, Decreased mobility, Postural dysfunction, Decreased strength, Decreased endurance, Decreased activity tolerance, Difficulty walking, Decreased safety awareness, Decreased balance, Impaired vision/preception, Impaired sensation, Cardiopulmonary status limiting activity  Visit Diagnosis: History of  falling  Abnormal posture  Muscle weakness (generalized)     Problem List Patient Active Problem List   Diagnosis Date Noted  . Surgery, elective   . Chronic diastolic congestive heart failure ([place])   . Chronic obstructive pulmonary disease ([place])   . Diabetes mellitus type 2 in nonobese (HCC)   . Benign essential HTN   . Post-operative pain   . Hyponatremia   . Leukocytosis   . Spondylolisthesis of lumbar region 04/27/2017  . Blood in stool   . Hiatal hernia   . Benign neoplasm of cecum   .  Benign neoplasm of transverse colon   . Diverticulosis of large intestine without diverticulitis   . Rectal bleeding 05/29/2015  . Non-intractable cyclical vomiting with nausea     Buccola,Allan C 08/28/2018, 11:39 AM   11:40 AM, 08/28/18 Etta Grandchild, PT, DPT Physical Therapist - [facility]  Outpatient Physical Therapy- [place] MAIN [facility] SERVICES [address] [place], [place], [address] Phone: [phone]   Fax:  [phone]  Name: Penny Hart MRN: [id number] Date of Birth: 1945/11/20

## 2018-08-30 ENCOUNTER — Ambulatory Visit: Payer: Medicare Other

## 2018-08-30 DIAGNOSIS — M6281 Muscle weakness (generalized): Secondary | ICD-10-CM

## 2018-08-30 DIAGNOSIS — Z9181 History of falling: Secondary | ICD-10-CM | POA: Diagnosis not present

## 2018-08-30 DIAGNOSIS — R293 Abnormal posture: Secondary | ICD-10-CM

## 2018-08-30 NOTE — Therapy (Signed)
[place] MAIN [facility] SERVICES [address] [place], [place], [address] Phone: [phone]   Fax:  [phone]  Physical Therapy Treatment  Patient Details  Name: Penny Hart MRN: [id number] Date of Birth: 10-20-1945 Referring Provider (PT): Jennings Books, MD   Encounter Date: 08/30/2018  PT End of Session - 08/30/18 1204    Visit Number  5    Number of Visits  17    Date for PT Re-Evaluation  10/09/18    Authorization Type  2/10 (Eval 08/13/18)    PT Start Time  1105    PT Stop Time  1145    PT Time Calculation (min)  40 min    Activity Tolerance  Patient tolerated treatment well;Patient limited by fatigue    Behavior During Therapy  [facility] for tasks assessed/performed       Past Medical History:  Diagnosis Date  . Arthritis   . CHF (congestive heart failure) ([place])   . COPD (chronic obstructive pulmonary disease) ([place])   . Diabetes mellitus without complication ([place])   . GERD (gastroesophageal reflux disease)   . Glaucoma   . Hiatal hernia   . Hyperlipidemia   . Hypertension   . Neuropathy   . Sleep apnea   . Spondylolisthesis of lumbar region     Past Surgical History:  Procedure Laterality Date  . ABDOMINAL HYSTERECTOMY    . BREAST EXCISIONAL BIOPSY Left yrs ago    benign  . COLONOSCOPY WITH PROPOFOL N/A 05/30/2015   Procedure: COLONOSCOPY WITH PROPOFOL;  Surgeon: Lucilla Lame, MD;  Location: [facility] ENDOSCOPY;  Service: Endoscopy;  Laterality: N/A;  . ESOPHAGOGASTRODUODENOSCOPY (EGD) WITH PROPOFOL N/A 05/30/2015   Procedure: ESOPHAGOGASTRODUODENOSCOPY (EGD) WITH PROPOFOL;  Surgeon: Lucilla Lame, MD;  Location: [facility] ENDOSCOPY;  Service: Endoscopy;  Laterality: N/A;  . EYE SURGERY     Bialteral for glaucoma  . Eye surgery for glaucoma    . JOINT REPLACEMENT     Left  . Left total knee replacement    . OOPHORECTOMY    . POSTERIOR LUMBAR FUSION  04/27/2017  . TUBAL LIGATION      There were no vitals filed for this  visit.  Subjective Assessment - 08/30/18 1202    Subjective  Pt is doing well this date. She reports some soreness in her shoulder and back after last session, which she is only just now beginning to resolve.     Pertinent History  Co-morbidities: COPD, CHF (diastolic), OA, glaucoma, DM, neuropathy, HTN, hyperlipidemia; currently taking beta-blocker therefore dampened HR response to activity; surgical hx for lumbar fusion, L TKA    Diagnostic tests  MRI: No acute or reversible finding. Mild small vessel change of the cerebral hemispheric white matter.    Patient Stated Goals  get rid of headaches, not feel dizzy    Currently in Pain?  --   does not rat.e          Intervention this date: -AMB intervals 173ft, VC for headturns, scanning walls for playing cards, calling out card names. Pt struggles with visual scanning, encouraged to maintain speed, and turn head more to the right 2/2 Rt eye visual impairment related to glaucoma. 2nd lap encouraged to call out opposite card color, third time, pt calls out color and cued to AMB backward.  -Lateral side steps with b-ball self toss 1x29ft bilat  -1x60sec airex stance -airex stance 3x30sec eyes closed  -side step up airex no hands 1x10 bilat -FWD Step up/down floor airex  6" step  airex  floor  180 degree turn/repeat      PT Short Term Goals - 08/14/18 1352      PT SHORT TERM GOAL #1   Title  Patient will be independent with HEP for improved therapeutic gains and decreased risk of falls.    Baseline  IE: not provided    Time  4    Period  Weeks    Status  New    Target Date  09/11/18        PT Long Term Goals - 08/16/18 1219      PT LONG TERM GOAL #1   Title  Pt will improve DGI by at least 3 points in order to demonstrate clinically significant improvement in balance and decreased risk for falls.    Baseline  IE: 15/24    Time  8    Period  Weeks    Status  New    Target Date  10/09/18      PT LONG TERM GOAL #2    Title  Pt will decrease DHI score by at least 18 points (45 points) in order to demonstrate clinically significant reduction in disability.     Baseline  IE: not taken 2/2 to fatigue/headache 1/23: 63    Time  8    Period  Weeks    Status  New    Target Date  10/09/18      PT LONG TERM GOAL #3   Title  Patient will require no external support to maintain balance when walking for greater than 5 minutes in order to improve safety in the community and decrease risk of falls.     Baseline  IE: patient had LOB at 3 minutes, required PT support and seated rest break to decrease dizziness;    Time  8    Period  Weeks    Status  New    Target Date  10/09/18      PT LONG TERM GOAL #4   Title  Pt will decrease 5TSTS by at least 3 seconds in order to demonstrate clinically significant improvement in LE strength and decrease risk of recurrent falls.    Baseline  IE: 19.7 sec, UE assist on thighs    Time  8    Period  Weeks    Status  New    Target Date  10/09/18            Plan - 08/30/18 1204    Clinical Impression Statement  Session focus on gait based dynamic balance activity, FWD and retro, with inclusion of dual tasking, cognitive tasks, and head turns for scanning. Pt also began static, eyes closed, and stepping activities with foam. Pt reports some mild dizziness throughout, and intermittent worsening of Rt temporal HA, similar to prior sessions. Pt continues to build confidence with activity. Few LOB in session, but patient may be keeping her activity within safe limitations. Pt given rest breaks as needed throughout session as needed.     Rehab Potential  Fair    PT Frequency  2x / week    PT Duration  8 weeks    PT Treatment/Interventions  ADLs/Self Care Home Management;Canalith Repostioning;Cryotherapy;Moist Heat;Gait training;Stair training;Functional mobility training;Therapeutic activities;Therapeutic exercise;Balance training;Neuromuscular re-education;Patient/family  education;Manual techniques;Dry needling;Scar mobilization;Passive range of motion;Energy conservation;Taping;Vestibular    PT Next Visit Plan  standing exercise, monitor vitals, revisit myofascial work to corrugator supercilii for HA managment (retest)     PT Home Exercise Plan  needs HEP  Consulted and Agree with Plan of Care  Patient       Patient will benefit from skilled therapeutic intervention in order to improve the following deficits and impairments:  Abnormal gait, Dizziness, Improper body mechanics, Pain, Decreased coordination, Decreased mobility, Postural dysfunction, Decreased strength, Decreased endurance, Decreased activity tolerance, Difficulty walking, Decreased safety awareness, Decreased balance, Impaired vision/preception, Impaired sensation, Cardiopulmonary status limiting activity  Visit Diagnosis: History of falling  Abnormal posture  Muscle weakness (generalized)     Problem List Patient Active Problem List   Diagnosis Date Noted  . Surgery, elective   . Chronic diastolic congestive heart failure ([place])   . Chronic obstructive pulmonary disease ([place])   . Diabetes mellitus type 2 in nonobese (HCC)   . Benign essential HTN   . Post-operative pain   . Hyponatremia   . Leukocytosis   . Spondylolisthesis of lumbar region 04/27/2017  . Blood in stool   . Hiatal hernia   . Benign neoplasm of cecum   . Benign neoplasm of transverse colon   . Diverticulosis of large intestine without diverticulitis   . Rectal bleeding 05/29/2015  . Non-intractable cyclical vomiting with nausea     12:10 PM, 08/30/18 Etta Grandchild, PT, DPT Physical Therapist - [facility]  Outpatient Physical Therapy- [place] C 08/30/2018, 12:08 PM  [place] MAIN [facility] SERVICES [address] [place], [place], [address] Phone: [phone]   Fax:  [phone]  Name: Penny Hart MRN: [id number] Date of Birth: 1946/06/26

## 2018-09-04 ENCOUNTER — Ambulatory Visit: Payer: Medicare Other

## 2018-09-04 DIAGNOSIS — R293 Abnormal posture: Secondary | ICD-10-CM

## 2018-09-04 DIAGNOSIS — Z9181 History of falling: Secondary | ICD-10-CM

## 2018-09-04 DIAGNOSIS — M6281 Muscle weakness (generalized): Secondary | ICD-10-CM

## 2018-09-04 NOTE — Therapy (Signed)
[place] MAIN [facility] SERVICES [address] [place], [place], [address] Phone: [phone]   Fax:  [phone]  Physical Therapy Treatment  Patient Details  Name: Penny Hart MRN: [id number] Date of Birth: 1946-03-25 Referring Provider (PT): Jennings Books, MD   Encounter Date: 09/04/2018  PT End of Session - 09/04/18 1211    Visit Number  6    Number of Visits  17    Date for PT Re-Evaluation  10/09/18    Authorization Type  6/10 (Eval 08/13/18)    PT Start Time  1058    PT Stop Time  1144    PT Time Calculation (min)  46 min    Activity Tolerance  Patient tolerated treatment well;Patient limited by fatigue    Behavior During Therapy  [facility] for tasks assessed/performed       Past Medical History:  Diagnosis Date  . Arthritis   . CHF (congestive heart failure) ([place])   . COPD (chronic obstructive pulmonary disease) ([place])   . Diabetes mellitus without complication ([place])   . GERD (gastroesophageal reflux disease)   . Glaucoma   . Hiatal hernia   . Hyperlipidemia   . Hypertension   . Neuropathy   . Sleep apnea   . Spondylolisthesis of lumbar region     Past Surgical History:  Procedure Laterality Date  . ABDOMINAL HYSTERECTOMY    . BREAST EXCISIONAL BIOPSY Left yrs ago    benign  . COLONOSCOPY WITH PROPOFOL N/A 05/30/2015   Procedure: COLONOSCOPY WITH PROPOFOL;  Surgeon: Lucilla Lame, MD;  Location: [facility] ENDOSCOPY;  Service: Endoscopy;  Laterality: N/A;  . ESOPHAGOGASTRODUODENOSCOPY (EGD) WITH PROPOFOL N/A 05/30/2015   Procedure: ESOPHAGOGASTRODUODENOSCOPY (EGD) WITH PROPOFOL;  Surgeon: Lucilla Lame, MD;  Location: [facility] ENDOSCOPY;  Service: Endoscopy;  Laterality: N/A;  . EYE SURGERY     Bialteral for glaucoma  . Eye surgery for glaucoma    . JOINT REPLACEMENT     Left  . Left total knee replacement    . OOPHORECTOMY    . POSTERIOR LUMBAR FUSION  04/27/2017  . TUBAL LIGATION      There were no vitals filed for this  visit.  Subjective Assessment - 09/04/18 1209    Subjective  Patient reports shes doing well, has no pain or headache right now but does occasionally get headaches. No falls or LOB since last session.     Pertinent History  Co-morbidities: COPD, CHF (diastolic), OA, glaucoma, DM, neuropathy, HTN, hyperlipidemia; currently taking beta-blocker therefore dampened HR response to activity; surgical hx for lumbar fusion, L TKA    Diagnostic tests  MRI: No acute or reversible finding. Mild small vessel change of the cerebral hemispheric white matter.    Patient Stated Goals  get rid of headaches, not feel dizzy    Currently in Pain?  No/denies        Neuro Re-ed  ambulate in hallway: vertical head turns following rainbow ball raises 2x 86 ft with CGA no LOB  Ambulate in hallway with horizontal head turns reading playing cards, visual scanning limited by vision deficits, missing multiple cards on wally 2x 86 ft with CGA.   Standing in // bars airex pad:  tandem stance one foot on each square 2x 30 seconds each LE, CGA   Balloon taps reaching inside and outside BOS 2 minutes CGA with no episodes of instability. Limited reach with LUE noted. Good ankle reactions controlling for stability with further reaches.   Step over and  back orange hurdle SUE support 10x each LE. Occasional limitation of hip flexion resulting in knocking over of hurdle. CGA.    Bosu ball lateral steps BUE support and verbal cueing for keeping trunk upright   Modified single limb stance with one LE on soccer ball. 2x 30 seconds each LE CGA no UE support. More challenging for RLE .   TherEx:   2lb ankle weights  Hip extension 10x each LE BUE support with CGA with verbal and tactile cueing for upright posture and knee extension for muscle activation  Hip abduction 10x each LE BUE support with CGA, verbal cueing required for neutral foot alignment.    Seated hamstring stretch on 6" step with verbal and visual cueing for  sequencing and body mechanics for optimal lengthening of tissues. 2x 30 seconds each LE.                  PT Education - 09/04/18 1209    Education Details  exercise technique, stability,     Person(s) Educated  Patient    Methods  Explanation;Demonstration;Tactile cues;Verbal cues    Comprehension  Verbalized understanding;Returned demonstration;Tactile cues required;Need further instruction;Verbal cues required       PT Short Term Goals - 08/14/18 1352      PT SHORT TERM GOAL #1   Title  Patient will be independent with HEP for improved therapeutic gains and decreased risk of falls.    Baseline  IE: not provided    Time  4    Period  Weeks    Status  New    Target Date  09/11/18        PT Long Term Goals - 08/16/18 1219      PT LONG TERM GOAL #1   Title  Pt will improve DGI by at least 3 points in order to demonstrate clinically significant improvement in balance and decreased risk for falls.    Baseline  IE: 15/24    Time  8    Period  Weeks    Status  New    Target Date  10/09/18      PT LONG TERM GOAL #2   Title  Pt will decrease DHI score by at least 18 points (45 points) in order to demonstrate clinically significant reduction in disability.     Baseline  IE: not taken 2/2 to fatigue/headache 1/23: 63    Time  8    Period  Weeks    Status  New    Target Date  10/09/18      PT LONG TERM GOAL #3   Title  Patient will require no external support to maintain balance when walking for greater than 5 minutes in order to improve safety in the community and decrease risk of falls.     Baseline  IE: patient had LOB at 3 minutes, required PT support and seated rest break to decrease dizziness;    Time  8    Period  Weeks    Status  New    Target Date  10/09/18      PT LONG TERM GOAL #4   Title  Pt will decrease 5TSTS by at least 3 seconds in order to demonstrate clinically significant improvement in LE strength and decrease risk of recurrent falls.     Baseline  IE: 19.7 sec, UE assist on thighs    Time  8    Period  Weeks    Status  New    Target Date  10/09/18            Plan - 09/04/18 1213    Clinical Impression Statement  Patient presents with good motivation to session. Standing interventions require intermittent rest breaks due to right knee pain from weightbearing. Patient stability with head turns is improving with decreasing instability and episodes of LOB. Single leg stability continues to be challenging to patient at this time.  Patient will benefit from skilled physical therapy to increase stability, decrease falls risk and dizziness, and return to prior level of function.     Rehab Potential  Fair    PT Frequency  2x / week    PT Duration  8 weeks    PT Treatment/Interventions  ADLs/Self Care Home Management;Canalith Repostioning;Cryotherapy;Moist Heat;Gait training;Stair training;Functional mobility training;Therapeutic activities;Therapeutic exercise;Balance training;Neuromuscular re-education;Patient/family education;Manual techniques;Dry needling;Scar mobilization;Passive range of motion;Energy conservation;Taping;Vestibular    PT Next Visit Plan  standing exercise, monitor vitals, revisit myofascial work to corrugator supercilii for HA managment (retest)     PT Home Exercise Plan  needs HEP     Consulted and Agree with Plan of Care  Patient       Patient will benefit from skilled therapeutic intervention in order to improve the following deficits and impairments:  Abnormal gait, Dizziness, Improper body mechanics, Pain, Decreased coordination, Decreased mobility, Postural dysfunction, Decreased strength, Decreased endurance, Decreased activity tolerance, Difficulty walking, Decreased safety awareness, Decreased balance, Impaired vision/preception, Impaired sensation, Cardiopulmonary status limiting activity  Visit Diagnosis: History of falling  Abnormal posture  Muscle weakness (generalized)     Problem  List Patient Active Problem List   Diagnosis Date Noted  . Surgery, elective   . Chronic diastolic congestive heart failure ([place])   . Chronic obstructive pulmonary disease ([place])   . Diabetes mellitus type 2 in nonobese (HCC)   . Benign essential HTN   . Post-operative pain   . Hyponatremia   . Leukocytosis   . Spondylolisthesis of lumbar region 04/27/2017  . Blood in stool   . Hiatal hernia   . Benign neoplasm of cecum   . Benign neoplasm of transverse colon   . Diverticulosis of large intestine without diverticulitis   . Rectal bleeding 05/29/2015  . Non-intractable cyclical vomiting with nausea    Janna Arch, PT, DPT   09/04/2018, 12:14 PM  [place] MAIN [facility] SERVICES [address] [place], [place], [address] Phone: [phone]   Fax:  [phone]  Name: BREYAH AKHTER MRN: [id number] Date of Birth: 10-27-1945

## 2018-09-06 ENCOUNTER — Ambulatory Visit: Payer: Medicare Other

## 2018-09-06 DIAGNOSIS — M6281 Muscle weakness (generalized): Secondary | ICD-10-CM

## 2018-09-06 DIAGNOSIS — R293 Abnormal posture: Secondary | ICD-10-CM

## 2018-09-06 DIAGNOSIS — Z9181 History of falling: Secondary | ICD-10-CM | POA: Diagnosis not present

## 2018-09-06 NOTE — Therapy (Signed)
[place] MAIN [facility] SERVICES [address] [place], [place], [address] Phone: [phone]   Fax:  [phone]  Physical Therapy Treatment  Patient Details  Name: Penny Hart MRN: [id number] Date of Birth: 1945-08-26 Referring Provider (PT): Jennings Books, MD   Encounter Date: 09/06/2018  PT End of Session - 09/06/18 1121    Visit Number  7    Number of Visits  17    Date for PT Re-Evaluation  10/09/18    Authorization Type  7/10 (Eval 08/13/18)    PT Start Time  1118    PT Stop Time  1158    PT Time Calculation (min)  40 min    Activity Tolerance  Patient tolerated treatment well;Patient limited by fatigue    Behavior During Therapy  [facility] for tasks assessed/performed       Past Medical History:  Diagnosis Date  . Arthritis   . CHF (congestive heart failure) ([place])   . COPD (chronic obstructive pulmonary disease) ([place])   . Diabetes mellitus without complication ([place])   . GERD (gastroesophageal reflux disease)   . Glaucoma   . Hiatal hernia   . Hyperlipidemia   . Hypertension   . Neuropathy   . Sleep apnea   . Spondylolisthesis of lumbar region     Past Surgical History:  Procedure Laterality Date  . ABDOMINAL HYSTERECTOMY    . BREAST EXCISIONAL BIOPSY Left yrs ago    benign  . COLONOSCOPY WITH PROPOFOL N/A 05/30/2015   Procedure: COLONOSCOPY WITH PROPOFOL;  Surgeon: Lucilla Lame, MD;  Location: [facility] ENDOSCOPY;  Service: Endoscopy;  Laterality: N/A;  . ESOPHAGOGASTRODUODENOSCOPY (EGD) WITH PROPOFOL N/A 05/30/2015   Procedure: ESOPHAGOGASTRODUODENOSCOPY (EGD) WITH PROPOFOL;  Surgeon: Lucilla Lame, MD;  Location: [facility] ENDOSCOPY;  Service: Endoscopy;  Laterality: N/A;  . EYE SURGERY     Bialteral for glaucoma  . Eye surgery for glaucoma    . JOINT REPLACEMENT     Left  . Left total knee replacement    . OOPHORECTOMY    . POSTERIOR LUMBAR FUSION  04/27/2017  . TUBAL LIGATION      There were no vitals filed for this  visit.  Subjective Assessment - 09/06/18 1120    Subjective  Pt doing well today, bakc is feeling better. She reports some HA/head sensation with head turns.     Pertinent History  Co-morbidities: COPD, CHF (diastolic), OA, glaucoma, DM, neuropathy, HTN, hyperlipidemia; currently taking beta-blocker therefore dampened HR response to activity; surgical hx for lumbar fusion, L TKA    Currently in Pain?  No/denies         Intervention this date: -STS from chair + airex: 2x10, hands free  -Seated marching in chair, alternating pattern, 2x20 -lateral side stepping in // bars 3lb ankle weights on, hands free, 2x3laps *more difficult moving to the right  -RetroAMB in // bars hands free 3lb ankle weights, 41ft, VC for avoidance of trunk flexion 180 degree turns x12.  -Lateral step-up and over, airex pad, 1x10 bilat  -Lateral step-up and over, airex pad, 1x10 q leg, +180 degree turnaround -Narrow Airex Stance ball take-n-pass over shoulder 8x each side -Ariex stance self ball toss 20x  -Fwd cone taps, unable to perform without knocking over, deferred  -Fwd step taps, hands free, 6" step height, alternating pattern -Narrow ariex overhead gaze 15x3secH    PT Short Term Goals - 08/14/18 1352      PT SHORT TERM GOAL #1   Title  Patient will be independent with HEP for improved therapeutic gains and decreased risk of falls.    Baseline  IE: not provided    Time  4    Period  Weeks    Status  New    Target Date  09/11/18        PT Long Term Goals - 08/16/18 1219      PT LONG TERM GOAL #1   Title  Pt will improve DGI by at least 3 points in order to demonstrate clinically significant improvement in balance and decreased risk for falls.    Baseline  IE: 15/24    Time  8    Period  Weeks    Status  New    Target Date  10/09/18      PT LONG TERM GOAL #2   Title  Pt will decrease DHI score by at least 18 points (45 points) in order to demonstrate clinically significant reduction in  disability.     Baseline  IE: not taken 2/2 to fatigue/headache 1/23: 63    Time  8    Period  Weeks    Status  New    Target Date  10/09/18      PT LONG TERM GOAL #3   Title  Patient will require no external support to maintain balance when walking for greater than 5 minutes in order to improve safety in the community and decrease risk of falls.     Baseline  IE: patient had LOB at 3 minutes, required PT support and seated rest break to decrease dizziness;    Time  8    Period  Weeks    Status  New    Target Date  10/09/18      PT LONG TERM GOAL #4   Title  Pt will decrease 5TSTS by at least 3 seconds in order to demonstrate clinically significant improvement in LE strength and decrease risk of recurrent falls.    Baseline  IE: 19.7 sec, UE assist on thighs    Time  8    Period  Weeks    Status  New    Target Date  10/09/18            Plan - 09/06/18 1123    Clinical Impression Statement  Continued with intervention this date to address weakness in hips, as well as isolated balance intervnetions, building from prior sessions and making more difficult in areas where patient has made progress.     Rehab Potential  Fair    PT Frequency  2x / week    PT Duration  8 weeks    PT Treatment/Interventions  ADLs/Self Care Home Management;Canalith Repostioning;Cryotherapy;Moist Heat;Gait training;Stair training;Functional mobility training;Therapeutic activities;Therapeutic exercise;Balance training;Neuromuscular re-education;Patient/family education;Manual techniques;Dry needling;Scar mobilization;Passive range of motion;Energy conservation;Taping;Vestibular    PT Next Visit Plan  standing exercise, monitor vitals, revisit myofascial work to corrugator supercilii for HA managment (retest)     PT Home Exercise Plan  needs HEP     Consulted and Agree with Plan of Care  Patient       Patient will benefit from skilled therapeutic intervention in order to improve the following deficits  and impairments:  Abnormal gait, Dizziness, Improper body mechanics, Pain, Decreased coordination, Decreased mobility, Postural dysfunction, Decreased strength, Decreased endurance, Decreased activity tolerance, Difficulty walking, Decreased safety awareness, Decreased balance, Impaired vision/preception, Impaired sensation, Cardiopulmonary status limiting activity  Visit Diagnosis: History of falling  Abnormal posture  Muscle weakness (generalized)  Problem List Patient Active Problem List   Diagnosis Date Noted  . Surgery, elective   . Chronic diastolic congestive heart failure ([place])   . Chronic obstructive pulmonary disease ([place])   . Diabetes mellitus type 2 in nonobese (HCC)   . Benign essential HTN   . Post-operative pain   . Hyponatremia   . Leukocytosis   . Spondylolisthesis of lumbar region 04/27/2017  . Blood in stool   . Hiatal hernia   . Benign neoplasm of cecum   . Benign neoplasm of transverse colon   . Diverticulosis of large intestine without diverticulitis   . Rectal bleeding 05/29/2015  . Non-intractable cyclical vomiting with nausea    11:59 AM, 09/06/18 Etta Grandchild, PT, DPT Physical Therapist - [facility]  Outpatient Physical [place] [phone]     Etta Grandchild 09/06/2018, 11:26 AM  [place] MAIN [facility] SERVICES [address] [place], [place], [address] Phone: [phone]   Fax:  [phone]  Name: Penny Hart MRN: [id number] Date of Birth: 10-02-1945

## 2018-09-11 ENCOUNTER — Ambulatory Visit: Payer: Medicare Other

## 2018-09-11 DIAGNOSIS — Z9181 History of falling: Secondary | ICD-10-CM | POA: Diagnosis not present

## 2018-09-11 DIAGNOSIS — R293 Abnormal posture: Secondary | ICD-10-CM

## 2018-09-11 DIAGNOSIS — M6281 Muscle weakness (generalized): Secondary | ICD-10-CM

## 2018-09-11 NOTE — Therapy (Signed)
[place] MAIN [facility] SERVICES [address] [place], [place], [address] Phone: [phone]   Fax:  [phone]  Physical Therapy Treatment/Progress Note Reporting Period 08/13/18-09/11/18  Patient Details  Name: Penny Hart MRN: [id number] Date of Birth: Mar 14, 1946 Referring Provider (PT): Jennings Books, MD   Encounter Date: 09/11/2018  PT End of Session - 09/11/18 1235    Visit Number  8    Number of Visits  17    Date for PT Re-Evaluation  10/09/18    Authorization Type  8/10 (Eval 08/13/18) (progress note done visit 8 on 2/18)    PT Start Time  1119    PT Stop Time  1145    PT Time Calculation (min)  26 min    Activity Tolerance  Patient limited by lethargy    Behavior During Therapy  --   appears dowsy or intoxicated, pt reports that she feels 'high'       Past Medical History:  Diagnosis Date  . Arthritis   . CHF (congestive heart failure) ([place])   . COPD (chronic obstructive pulmonary disease) ([place])   . Diabetes mellitus without complication ([place])   . GERD (gastroesophageal reflux disease)   . Glaucoma   . Hiatal hernia   . Hyperlipidemia   . Hypertension   . Neuropathy   . Sleep apnea   . Spondylolisthesis of lumbar region     Past Surgical History:  Procedure Laterality Date  . ABDOMINAL HYSTERECTOMY    . BREAST EXCISIONAL BIOPSY Left yrs ago    benign  . COLONOSCOPY WITH PROPOFOL N/A 05/30/2015   Procedure: COLONOSCOPY WITH PROPOFOL;  Surgeon: Lucilla Lame, MD;  Location: [facility] ENDOSCOPY;  Service: Endoscopy;  Laterality: N/A;  . ESOPHAGOGASTRODUODENOSCOPY (EGD) WITH PROPOFOL N/A 05/30/2015   Procedure: ESOPHAGOGASTRODUODENOSCOPY (EGD) WITH PROPOFOL;  Surgeon: Lucilla Lame, MD;  Location: [facility] ENDOSCOPY;  Service: Endoscopy;  Laterality: N/A;  . EYE SURGERY     Bialteral for glaucoma  . Eye surgery for glaucoma    . JOINT REPLACEMENT     Left  . Left total knee replacement    . OOPHORECTOMY    . POSTERIOR LUMBAR  FUSION  04/27/2017  . TUBAL LIGATION      There were no vitals filed for this visit.  Subjective Assessment - 09/11/18 1131    Subjective  Pt arrives this date drowsy, found asleep in waiting room, reports her intermittent HA was getting worse this date, and she took some additional pain med, but not clear which ones. DTR later explains that patient 'likely' took her baclofen this morning rather than at night as recommended. Pt denies this.     Pertinent History  Co-morbidities: COPD, CHF (diastolic), OA, glaucoma, DM, neuropathy, HTN, hyperlipidemia; currently taking beta-blocker therefore dampened HR response to activity; surgical hx for lumbar fusion, L TKA    Limitations  Lifting;Standing;Walking;House hold activities;Reading    Diagnostic tests  MRI: No acute or reversible finding. Mild small vessel change of the cerebral hemispheric white matter.    Patient Stated Goals  get rid of headaches, not feel dizzy    Currently in Pain?  No/denies        Intervention this date:  -1273ft AMB over 6 minutes 45 seconds, averaged gait speed 0.45m/s -Rt periorbital myofascial release  -education on importance of correct dosing with meds, discussed with pt and DTR to emphasize limitations in treatment as well as safety considerations for AMB.    PT Short Term Goals -  09/11/18 1246      PT SHORT TERM GOAL #1   Title  Patient will be independent with HEP for improved therapeutic gains and decreased risk of falls.    Baseline  IE: not provided    Time  4    Period  Weeks    Status  On-going    Target Date  09/11/18        PT Long Term Goals - 09/11/18 1246      PT LONG TERM GOAL #1   Title  Pt will improve DGI by at least 3 points in order to demonstrate clinically significant improvement in balance and decreased risk for falls.    Baseline  IE: 15/24; deferred retest until next visit    Time  8    Period  Weeks    Status  On-going    Target Date  10/09/18      PT LONG TERM GOAL #2    Title  Pt will decrease DHI score by at least 18 points (45 points) in order to demonstrate clinically significant reduction in disability.     Baseline  IE: not taken 2/2 to fatigue/headache 1/23: 63 (defered until next vsit, as patient is altered this date)    Time  8    Period  Weeks    Status  On-going    Target Date  10/09/18      PT LONG TERM GOAL #3   Title  Patient will require no external support to maintain balance when walking for greater than 5 minutes in order to improve safety in the community and decrease risk of falls.     Baseline  IE: patient had LOB at 3 minutes, required PT support and seated rest break to decrease dizziness; 2/18: pt able to complete without LOB, but gluteal insuficicency on Right gradually breask down gait quality andmotor control potential    Time  8    Period  Weeks    Status  Achieved    Target Date  10/09/18            Plan - 09/11/18 1236    Clinical Impression Statement  Reassessment performed this date. Pt arrives after presumed (per DTR) medication dossage error, found asleep in waiting area, and later falls asleep during treatment while lying supine. Pt participated in performance of 6MWT average speed of 0.62m/s, then myofascial release of Right supraorbital musculature that remains intermitently painful since hitting her head several weaks ago. Pt denies dizziness this date, and over the course of the most recent treatment sessions, has reported much less dizziness during interventions. Pt is able to perform all AMB this date without physical assistance, although she has much more crossover and staggering which is atypical and likely resultant of baclofen dose this morning. Pt reports improved periorbital pain after manual therapy, but remain oo drowsy to participate in high level balance training. Overall patient has improve AMB capcity and balance, decreased dizziness. Her limitations as a historian make it difficulty to get a good  detailed account of her HA symptoms and potential contributing factors.     Rehab Potential  Fair    PT Frequency  2x / week    PT Duration  8 weeks    PT Treatment/Interventions  ADLs/Self Care Home Management;Canalith Repostioning;Cryotherapy;Moist Heat;Gait training;Stair training;Functional mobility training;Therapeutic activities;Therapeutic exercise;Balance training;Neuromuscular re-education;Patient/family education;Manual techniques;Dry needling;Scar mobilization;Passive range of motion;Energy conservation;Taping;Vestibular    PT Next Visit Plan  Hip strength, balance interventions, and retest DGI.  PT Home Exercise Plan  needs HEP     Consulted and Agree with Plan of Care  Patient    Family Member Consulted  Daughter       Patient will benefit from skilled therapeutic intervention in order to improve the following deficits and impairments:  Abnormal gait, Dizziness, Improper body mechanics, Pain, Decreased coordination, Decreased mobility, Postural dysfunction, Decreased strength, Decreased endurance, Decreased activity tolerance, Difficulty walking, Decreased safety awareness, Decreased balance, Impaired vision/preception, Impaired sensation, Cardiopulmonary status limiting activity  Visit Diagnosis: History of falling  Abnormal posture  Muscle weakness (generalized)     Problem List Patient Active Problem List   Diagnosis Date Noted  . Surgery, elective   . Chronic diastolic congestive heart failure ([place])   . Chronic obstructive pulmonary disease ([place])   . Diabetes mellitus type 2 in nonobese (H[facility])   . Benign essential HTN   . Post-operative pain   . Hyponatremia   . Leukocytosis   . Spondylolisthesis of lumbar region 04/27/2017  . Blood in stool   . Hiatal hernia   . Benign neoplasm of cecum   . Benign neoplasm of transverse colon   . Diverticulosis of large intestine without diverticulitis   . Rectal bleeding 05/29/2015  . Non-intractable cyclical vomiting  with nausea     12:51 PM, 09/11/18 Etta Grandchild, PT, DPT Physical Therapist - [place] [phone]    Etta Grandchild 09/11/2018, 12:49 PM  [place] MAIN [facility] SERVICES [address] [place], [place], [address] Phone: [phone]   Fax:  [phone]  Name: RIGBY SWAMY MRN: [id number] Date of Birth: 12/02/1945

## 2018-09-13 ENCOUNTER — Ambulatory Visit: Payer: Medicare Other

## 2018-09-18 ENCOUNTER — Ambulatory Visit: Payer: Medicare Other

## 2018-09-18 DIAGNOSIS — R293 Abnormal posture: Secondary | ICD-10-CM

## 2018-09-18 DIAGNOSIS — Z9181 History of falling: Secondary | ICD-10-CM | POA: Diagnosis not present

## 2018-09-18 DIAGNOSIS — M6281 Muscle weakness (generalized): Secondary | ICD-10-CM

## 2018-09-18 NOTE — Therapy (Signed)
[place] MAIN [facility] SERVICES [address] [place], [place], [address] Phone: [phone]   Fax:  [phone]  Physical Therapy Treatment  Patient Details  Name: Penny Hart MRN: [id number] Date of Birth: Oct 16, 1945 Referring Provider (PT): Jennings Books, MD   Encounter Date: 09/18/2018  PT End of Session - 09/18/18 1020    Visit Number  9    Number of Visits  17    Date for PT Re-Evaluation  10/09/18    Authorization Type  9/10 (Eval 08/13/18) (progress note done visit 8 on 2/18)    PT Start Time  0955    PT Stop Time  1030    PT Time Calculation (min)  35 min    Activity Tolerance  Patient tolerated treatment well    Behavior During Therapy  [facility] for tasks assessed/performed       Past Medical History:  Diagnosis Date  . Arthritis   . CHF (congestive heart failure) ([place])   . COPD (chronic obstructive pulmonary disease) ([place])   . Diabetes mellitus without complication ([place])   . GERD (gastroesophageal reflux disease)   . Glaucoma   . Hiatal hernia   . Hyperlipidemia   . Hypertension   . Neuropathy   . Sleep apnea   . Spondylolisthesis of lumbar region     Past Surgical History:  Procedure Laterality Date  . ABDOMINAL HYSTERECTOMY    . BREAST EXCISIONAL BIOPSY Left yrs ago    benign  . COLONOSCOPY WITH PROPOFOL N/A 05/30/2015   Procedure: COLONOSCOPY WITH PROPOFOL;  Surgeon: Lucilla Lame, MD;  Location: [facility] ENDOSCOPY;  Service: Endoscopy;  Laterality: N/A;  . ESOPHAGOGASTRODUODENOSCOPY (EGD) WITH PROPOFOL N/A 05/30/2015   Procedure: ESOPHAGOGASTRODUODENOSCOPY (EGD) WITH PROPOFOL;  Surgeon: Lucilla Lame, MD;  Location: [facility] ENDOS[facility];  Service: Endoscopy;  Laterality: N/A;  . EYE SURGERY     Bialteral for glaucoma  . Eye surgery for glaucoma    . JOINT REPLACEMENT     Left  . Left total knee replacement    . OOPHORECTOMY    . POSTERIOR LUMBAR FUSION  04/27/2017  . TUBAL LIGATION      There were no vitals filed for this  visit.  Subjective Assessment - 09/18/18 1014    Subjective  Pt arrives with her 3 meds from specialist.  Has questions about her baclofen, metoclopramide, and meclazine. Pt is alert and oriented x4 this date.     Pertinent History  Co-morbidities: COPD, CHF (diastolic), OA, glaucoma, DM, neuropathy, HTN, hyperlipidemia; currently taking beta-blocker therefore dampened HR response to activity; surgical hx for lumbar fusion, L TKA    Currently in Pain?  Yes    Pain Score  --   intermitent HA (has trouble utilizing NPRS effectively)    Pain Descriptors / Indicators  --   intermittenly comes and goes throughout session.         Therapeutic Exercise: -2MWT: 393 ft, 1.63m/s, no LOB. Valgus in right knee (structural) with foot/arch collapse, and Right hip vaulting.  -8x STS from chair (knee pain), difficulty rising after 6  -10xSTS from chair+airex pad improved pain/effort  Neuromuscular Re-education:  -normal stance, foam surface head turns horizonal 20x each side, 3secH  -normal stance, foam surface, vertical head turns 20x  -AMB in hall 2x25ft with horizontal scanning for card naming      PT Short Term Goals - 09/11/18 1246      PT SHORT TERM GOAL #1   Title  Patient will be  independent with HEP for improved therapeutic gains and decreased risk of falls.    Baseline  IE: not provided    Time  4    Period  Weeks    Status  On-going    Target Date  09/11/18        PT Long Term Goals - 09/11/18 1246      PT LONG TERM GOAL #1   Title  Pt will improve DGI by at least 3 points in order to demonstrate clinically significant improvement in balance and decreased risk for falls.    Baseline  IE: 15/24; deferred retest until next visit    Time  8    Period  Weeks    Status  On-going    Target Date  10/09/18      PT LONG TERM GOAL #2   Title  Pt will decrease DHI score by at least 18 points (45 points) in order to demonstrate clinically significant reduction in disability.      Baseline  IE: not taken 2/2 to fatigue/headache 1/23: 63 (defered until next vsit, as patient is altered this date)    Time  8    Period  Weeks    Status  On-going    Target Date  10/09/18      PT LONG TERM GOAL #3   Title  Patient will require no external support to maintain balance when walking for greater than 5 minutes in order to improve safety in the community and decrease risk of falls.     Baseline  IE: patient had LOB at 3 minutes, required PT support and seated rest break to decrease dizziness; 2/18: pt able to complete without LOB, but gluteal insuficicency on Right gradually breask down gait quality andmotor control potential    Time  8    Period  Weeks    Status  Achieved    Target Date  10/09/18            Plan - 09/18/18 1022    Clinical Impression Statement  Pt arrives off meds today, alert and oriented. Spent time answering some questions regarding medications, but will need to deffer mcuh to prescribing provider. Pt is on multiple medications that can cause drowsiness, as well as gabapentin from her PCP. Her function last visit seems suspicious for polypharmacy related drowsiness, and pt is in conflict with DTR over specifics for meds instructions. Pt performing much better with balance activities this date. Minimal to no dizziness, with head tunrs only. Eye tracking exam, VOR and VOR cancelation, all of which appear WNL. 2MWT is used to assess walkign speed over distance, whereas 6MWT is limited by chronic back/hip knee issues the patient has. Pt progressing well toward goals, will need further help from MD regarding medication instruction. Pt does well with head turns, which make her mildly mor edizzy at times. She has themost difficulty with balance during vertical head turns on foam.    Rehab Potential  Fair    PT Frequency  2x / week    PT Duration  8 weeks    PT Treatment/Interventions  ADLs/Self Care Home Management;Canalith Repostioning;Cryotherapy;Moist Heat;Gait  training;Stair training;Functional mobility training;Therapeutic activities;Therapeutic exercise;Balance training;Neuromuscular re-education;Patient/family education;Manual techniques;Dry needling;Scar mobilization;Passive range of motion;Energy conservation;Taping;Vestibular    PT Next Visit Plan  Hip strength, balance interventions, and retest DGI.     PT Home Exercise Plan  needs HEP     Consulted and Agree with Plan of Care  Patient    Family Member  Consulted  Daughter       Patient will benefit from skilled therapeutic intervention in order to improve the following deficits and impairments:  Abnormal gait, Dizziness, Improper body mechanics, Pain, Decreased coordination, Decreased mobility, Postural dysfunction, Decreased strength, Decreased endurance, Decreased activity tolerance, Difficulty walking, Decreased safety awareness, Decreased balance, Impaired vision/preception, Impaired sensation, Cardiopulmonary status limiting activity  Visit Diagnosis: History of falling  Abnormal posture  Muscle weakness (generalized)     Problem List Patient Active Problem List   Diagnosis Date Noted  . Surgery, elective   . Chronic diastolic congestive heart failure ([place])   . Chronic obstructive pulmonary disease ([place])   . Diabetes mellitus type 2 in nonobese (HCC)   . Benign essential HTN   . Post-operative pain   . Hyponatremia   . Leukocytosis   . Spondylolisthesis of lumbar region 04/27/2017  . Blood in stool   . Hiatal hernia   . Benign neoplasm of cecum   . Benign neoplasm of transverse colon   . Diverticulosis of large intestine without diverticulitis   . Rectal bleeding 05/29/2015  . Non-intractable cyclical vomiting with nausea    10:50 AM, 09/18/18 Etta Grandchild, PT, DPT Physical Therapist - [facility]  Outpatient Physical [place] [phone]     Etta Grandchild 09/18/2018, 10:50 AM  [place] MAIN [facility] SERVICES [address] [place], [place], [address] Phone: [phone]   Fax:  [phone]  Name: Penny Hart MRN: [id number] Date of Birth: Mar 03, 1946

## 2018-09-20 ENCOUNTER — Ambulatory Visit: Payer: Medicare Other

## 2018-09-20 DIAGNOSIS — Z9181 History of falling: Secondary | ICD-10-CM

## 2018-09-20 DIAGNOSIS — M6281 Muscle weakness (generalized): Secondary | ICD-10-CM

## 2018-09-20 DIAGNOSIS — R293 Abnormal posture: Secondary | ICD-10-CM

## 2018-09-20 NOTE — Therapy (Signed)
[place] [facility] SERVICES [address] [place], [place], [address] Phone: [phone]   Fax:  [phone]  Physical Therapy Treatment/Progress Note Reporting Period: 08/13/18-09/20/18  Patient Details  Name: Penny Hart MRN: [id number] Date of Birth: Jan 22, 1946 Referring Provider (PT): [place] Books, MD   Encounter Date: 09/20/2018  PT End of Session - 09/20/18 1010    Visit Number  10    Number of Visits  17    Date for PT Re-Evaluation  10/09/18    Authorization Type  10/10 (Eval 08/13/18) (progress note done visit 8 on 2/18)    PT Start Time  0945    PT Stop Time  1025    PT Time Calculation (min)  40 min       Past Medical History:  Diagnosis Date  . Arthritis   . CHF (congestive heart failure) ([place])   . COPD (chronic obstructive pulmonary disease) ([place])   . Diabetes mellitus without complication ([place])   . GERD (gastroesophageal reflux disease)   . Glaucoma   . Hiatal hernia   . Hyperlipidemia   . Hypertension   . Neuropathy   . Sleep apnea   . Spondylolisthesis of lumbar region     Past Surgical History:  Procedure Laterality Date  . ABDOMINAL HYSTERECTOMY    . BREAST EXCISIONAL BIOPSY Left yrs ago    benign  . COLONOSCOPY WITH PROPOFOL N/A 05/30/2015   Procedure: COLONOSCOPY WITH PROPOFOL;  Surgeon: Lucilla Lame, MD;  Location: [facility] ENDOSCOPY;  Service: Endoscopy;  Laterality: N/A;  . ESOPHAGOGASTRODUODENOSCOPY (EGD) WITH PROPOFOL N/A 05/30/2015   Procedure: ESOPHAGOGASTRODUODENOSCOPY (EGD) WITH PROPOFOL;  Surgeon: Lucilla Lame, MD;  Location: [facility] ENDOSCOPY;  Service: Endoscopy;  Laterality: N/A;  . EYE SURGERY     Bialteral for glaucoma  . Eye surgery for glaucoma    . JOINT REPLACEMENT     Left  . Left total knee replacement    . OOPHORECTOMY    . POSTERIOR LUMBAR FUSION  04/27/2017  . TUBAL LIGATION      There were no vitals filed for this visit.  Subjective Assessment - 09/20/18 1005    Subjective  Pt  reports she did not contact neurology regarding he rmedicaitons and took all three Tuesday night, even the PRN meclizine, despite not feeling dizzy or nauseated. She is slept all day yesterday. This morning she awoke with Right sided HA, dizziness, and Rt tinitus, all of which resolved after roughly 5 minutes. She continues to be a limited historian with temporal aspects of hr symptoms, and although this is the first she has mentioned the tinitus, she says that she has it frequently.      Pertinent History  Co-morbidities: COPD, CHF (diastolic), OA, glaucoma, DM, neuropathy, HTN, hyperlipidemia; currently taking beta-blocker therefore dampened HR response to activity; surgical hx for lumbar fusion, L TKA    Diagnostic tests  MRI: No acute or reversible finding. Mild small vessel change of the cerebral hemispheric white matter.    Patient Stated Goals  get rid of headaches, not feel dizzy    Currently in Pain?  No/denies         Therapeutic Exercise: -DGI gait/balance testing (8 minutes) 19/24 -10MWT: 1.51m/s -5xSTS: 18.49s (hands free) performed 3 times       PT Short Term Goals - 09/20/18 1019      PT SHORT TERM GOAL #1   Title  Patient will be independent with HEP for improved therapeutic gains and decreased risk  of falls.    Baseline  IE: not provided yet, pt will need closer supervision to peform which is not readily available.     Time  4    Period  Weeks    Status  Deferred    Target Date  09/11/18        PT Long Term Goals - 09/20/18 1020      PT LONG TERM GOAL #1   Title  Pt will improve DGI by at least 3 points in order to demonstrate clinically significant improvement in balance and decreased risk for falls.    Baseline  IE: 15/24; DGI on 2/27 19/24.     Time  8    Period  Weeks    Status  On-going    Target Date  10/09/18      PT LONG TERM GOAL #2   Title  Pt will decrease DHI score by at least 18 points (45 points) in order to demonstrate clinically significant  reduction in disability.     Baseline  IE: not taken 2/2 to fatigue/headache 1/23: 63 (dizziness/HA unchanged, pt is a limited historian)    Time  8    Period  Weeks    Status  Deferred    Target Date  10/09/18      PT LONG TERM GOAL #3   Title  Patient will require no external support to maintain balance when walking for greater than 5 minutes in order to improve safety in the community and decrease risk of falls.     Baseline  IE: patient had LOB at 3 minutes, required PT support and seated rest break to decrease dizziness; 2/18: pt able to complete without LOB, but gluteal insuficicency on Right gradually breask down gait quality andmotor control potential    Time  8    Period  Weeks    Status  Achieved      PT LONG TERM GOAL #4   Title  Pt will decrease 5TSTS by at least 3 seconds in order to demonstrate clinically significant improvement in LE strength and decrease risk of recurrent falls.    Baseline  IE: 19.7 sec, UE assist on thighs; 2/27: 18.49sec hands free     Time  8    Period  Weeks    Status  On-going    Target Date  10/09/18            Plan - 09/20/18 1012    Clinical Impression Statement  Reviewed DGI this date with mild improvements, as well as 10MWT (1.59m/s) with improvment. Balance deficits conitnue to improve, however insidious intermittent HA and dizziness remain unaffected by treatment thus far, hence pt will be moved to schedule of another PT in future visit who has specialty training on vestibular treatment. Strength remains limited, particularly in the hips and back, which do contribute minimally to her gait  limitations over longer distances. Potential for diziness and imbalance to be related to polypharmacy remains high, and patient/family have yet to contact referring provider for clarification.      Rehab Potential  Fair    PT Frequency  2x / week    PT Duration  8 weeks    PT Treatment/Interventions  ADLs/Self Care Home Management;Canalith  Repostioning;Cryotherapy;Moist Heat;Gait training;Stair training;Functional mobility training;Therapeutic activities;Therapeutic exercise;Balance training;Neuromuscular re-education;Patient/family education;Manual techniques;Dry needling;Scar mobilization;Passive range of motion;Energy conservation;Taping;Vestibular    PT Next Visit Plan  Continue with gait base balance training. Concussion screening and vestibular exam with Corene Cornea.  PT Home Exercise Plan  No updates at this time.     Consulted and Agree with Plan of Care  Patient    Family Member Consulted  Daughter       Patient will benefit from skilled therapeutic intervention in order to improve the following deficits and impairments:  Abnormal gait, Dizziness, Improper body mechanics, Pain, Decreased coordination, Decreased mobility, Postural dysfunction, Decreased strength, Decreased endurance, Decreased activity tolerance, Difficulty walking, Decreased safety awareness, Decreased balance, Impaired vision/preception, Impaired sensation, Cardiopulmonary status limiting activity  Visit Diagnosis: History of falling  Abnormal posture  Muscle weakness (generalized)     Problem List Patient Active Problem List   Diagnosis Date Noted  . Surgery, elective   . Chronic diastolic congestive heart failure ([place])   . Chronic obstructive pulmonary disease ([place])   . Diabetes mellitus type 2 in nonobese ([facility])   . Benign essential HTN   . Post-operative pain   . Hyponatremia   . Leukocytosis   . Spondylolisthesis of lumbar region 04/27/2017  . Blood in stool   . Hiatal hernia   . Benign neoplasm of cecum   . Benign neoplasm of transverse colon   . Diverticulosis of large intestine without diverticulitis   . Rectal bleeding 05/29/2015  . Non-intractable cyclical vomiting with nausea    10:28 AM, 09/20/18 Etta Grandchild, PT, DPT Physical Therapist - [facility]  Outpatient Physical Therapy- [place] [phone]     Etta Grandchild 09/20/2018, 10:24 AM  [place] [facility] SERVICES [address] [place], [place], [address] Phone: [phone]   Fax:  [phone]  Name: AAHANA ELZA MRN: [id number] Date of Birth: January 01, 1946

## 2018-09-24 ENCOUNTER — Ambulatory Visit: Payer: Medicare Other | Attending: Neurology

## 2018-09-24 DIAGNOSIS — Z9181 History of falling: Secondary | ICD-10-CM | POA: Insufficient documentation

## 2018-09-24 DIAGNOSIS — M6281 Muscle weakness (generalized): Secondary | ICD-10-CM | POA: Insufficient documentation

## 2018-09-24 DIAGNOSIS — R293 Abnormal posture: Secondary | ICD-10-CM

## 2018-09-24 NOTE — Therapy (Signed)
[place] MAIN [facility] SERVICES [address] [place], [place], [address] Phone: [phone]   Fax:  [phone]  Physical Therapy Treatment  Patient Details  Name: Penny Hart MRN: [id number] Date of Birth: 03-25-1946 Referring Provider (PT): [place] Books, MD   Encounter Date: 09/24/2018  PT End of Session - 09/24/18 1602    Visit Number  11    Number of Visits  17    Date for PT Re-Evaluation  10/09/18    Authorization Type  11/10 (Eval 08/13/18) (progress note done visit 8 on 2/18)    PT Start Time  1540   pt arrived late   PT Stop Time  1610    PT Time Calculation (min)  30 min    Activity Tolerance  Patient tolerated treatment well    Behavior During Therapy  [facility] for tasks assessed/performed       Past Medical History:  Diagnosis Date  . Arthritis   . CHF (congestive heart failure) ([place])   . COPD (chronic obstructive pulmonary disease) ([place])   . Diabetes mellitus without complication ([place])   . GERD (gastroesophageal reflux disease)   . Glaucoma   . Hiatal hernia   . Hyperlipidemia   . Hypertension   . Neuropathy   . Sleep apnea   . Spondylolisthesis of lumbar region     Past Surgical History:  Procedure Laterality Date  . ABDOMINAL HYSTERECTOMY    . BREAST EXCISIONAL BIOPSY Left yrs ago    benign  . COLONOSCOPY WITH PROPOFOL N/A 05/30/2015   Procedure: COLONOSCOPY WITH PROPOFOL;  Surgeon: Lucilla Lame, MD;  Location: [facility] ENDOSCOPY;  Service: Endoscopy;  Laterality: N/A;  . ESOPHAGOGASTRODUODENOSCOPY (EGD) WITH PROPOFOL N/A 05/30/2015   Procedure: ESOPHAGOGASTRODUODENOSCOPY (EGD) WITH PROPOFOL;  Surgeon: Lucilla Lame, MD;  Location: [facility] ENDOSCOPY;  Service: Endoscopy;  Laterality: N/A;  . EYE SURGERY     Bialteral for glaucoma  . Eye surgery for glaucoma    . JOINT REPLACEMENT     Left  . Left total knee replacement    . OOPHORECTOMY    . POSTERIOR LUMBAR FUSION  04/27/2017  . TUBAL LIGATION      There were no  vitals filed for this visit.  Subjective Assessment - 09/24/18 1543    Subjective  Pt reports she is doing well today. Had some headache today, none upon arrival but reports she had a little this evening (which has not yet happened). Pt reports she called Dr. Trena Platt office today to check about medications, but has not heard back.     Pertinent History  Co-morbidities: COPD, CHF (diastolic), OA, glaucoma, DM, neuropathy, HTN, hyperlipidemia; currently taking beta-blocker therefore dampened HR response to activity; surgical hx for lumbar fusion, L TKA         Therapeutic Exercise: -lateral step-up and over airex pad 1x8 bilat, hands free, in //bars, supervision for balance  -2x10 STS from chair+airex pad  -Lateral side stepping with cable resistance 1x20ft bilat with 7.5lbs (mod-maxA for 1 LOB, otherwise MinGuard assist throughout)  -retroAMB c cable resistance 1x32ft with 12.5lbs,    Neuromuscular Re-education:  -normal stance, foam surface ball grasp and hand-off with trunk rotation 10x bilat  -normal stance, foam surface, vertical head turns 10x, verbally cued  -retroAMB in //bars x 64ft c 4x 180 degree turns (dizziness at end of this)    PT Short Term Goals - 09/20/18 1019      PT SHORT TERM GOAL #1   Title  Patient will be independent with HEP for improved therapeutic gains and decreased risk of falls.    Baseline  IE: not provided yet, pt will need closer supervision to peform which is not readily available.     Time  4    Period  Weeks    Status  Deferred    Target Date  09/11/18        PT Long Term Goals - 09/20/18 1020      PT LONG TERM GOAL #1   Title  Pt will improve DGI by at least 3 points in order to demonstrate clinically significant improvement in balance and decreased risk for falls.    Baseline  IE: 15/24; DGI on 2/27 19/24.     Time  8    Period  Weeks    Status  On-going    Target Date  10/09/18      PT LONG TERM GOAL #2   Title  Pt will decrease DHI  score by at least 18 points (45 points) in order to demonstrate clinically significant reduction in disability.     Baseline  IE: not taken 2/2 to fatigue/headache 1/23: 63 (dizziness/HA unchanged, pt is a limited historian)    Time  8    Period  Weeks    Status  Deferred    Target Date  10/09/18      PT LONG TERM GOAL #3   Title  Patient will require no external support to maintain balance when walking for greater than 5 minutes in order to improve safety in the community and decrease risk of falls.     Baseline  IE: patient had LOB at 3 minutes, required PT support and seated rest break to decrease dizziness; 2/18: pt able to complete without LOB, but gluteal insuficicency on Right gradually breask down gait quality andmotor control potential    Time  8    Period  Weeks    Status  Achieved      PT LONG TERM GOAL #4   Title  Pt will decrease 5TSTS by at least 3 seconds in order to demonstrate clinically significant improvement in LE strength and decrease risk of recurrent falls.    Baseline  IE: 19.7 sec, UE assist on thighs; 2/27: 18.49sec hands free     Time  8    Period  Weeks    Status  On-going    Target Date  10/09/18            Plan - 09/24/18 1603    Clinical Impression Statement  Continued with balance interventions this date. Continues to struggle frontal plane movements and variable surface heights. Introduced cable resisted walking this date, which is very challenging for patient, especially in eccentric control, even at low resistance levels. PT continues to improve her independence with basic household mobility. Pt reports some dizziness at end of session after 45ft feet retroAMB.     Rehab Potential  Fair    PT Frequency  2x / week    PT Duration  8 weeks    PT Treatment/Interventions  ADLs/Self Care Home Management;Canalith Repostioning;Cryotherapy;Moist Heat;Gait training;Stair training;Functional mobility training;Therapeutic activities;Therapeutic  exercise;Balance training;Neuromuscular re-education;Patient/family education;Manual techniques;Dry needling;Scar mobilization;Passive range of motion;Energy conservation;Taping;Vestibular    PT Next Visit Plan  Continue with gait base balance training. Concussion screening and vestibular exam with Corene Cornea.     PT Home Exercise Plan  No updates at this time.     Consulted and Agree with Plan of Care  Patient  Family Member Consulted  Daughter       Patient will benefit from skilled therapeutic intervention in order to improve the following deficits and impairments:  Abnormal gait, Dizziness, Improper body mechanics, Pain, Decreased coordination, Decreased mobility, Postural dysfunction, Decreased strength, Decreased endurance, Decreased activity tolerance, Difficulty walking, Decreased safety awareness, Decreased balance, Impaired vision/preception, Impaired sensation, Cardiopulmonary status limiting activity  Visit Diagnosis: History of falling  Abnormal posture  Muscle weakness (generalized)     Problem List Patient Active Problem List   Diagnosis Date Noted  . Surgery, elective   . Chronic diastolic congestive heart failure ([place])   . Chronic obstructive pulmonary disease ([place])   . Diabetes mellitus type 2 in nonobese ([facility])   . Benign essential HTN   . Post-operative pain   . Hyponatremia   . Leukocytosis   . Spondylolisthesis of lumbar region 04/27/2017  . Blood in stool   . Hiatal hernia   . Benign neoplasm of cecum   . Benign neoplasm of transverse colon   . Diverticulosis of large intestine without diverticulitis   . Rectal bleeding 05/29/2015  . Non-intractable cyclical vomiting with nausea    4:13 PM, 09/24/18 Etta Grandchild, PT, DPT Physical Therapist - [place] [facility] 09/24/2018, 4:10 PM  [place] [facility]IN [facility]  SERVICES [address] [place], [place], [address] Phone: [phone]   Fax:  [phone]  Name: Penny Hart MRN: [id number] Date of Birth: 11-17-1945

## 2018-09-27 ENCOUNTER — Encounter: Payer: Self-pay | Admitting: Physical Therapy

## 2018-09-27 ENCOUNTER — Ambulatory Visit: Payer: Medicare Other | Admitting: Physical Therapy

## 2018-09-27 VITALS — BP 123/70 | HR 54

## 2018-09-27 DIAGNOSIS — R293 Abnormal posture: Secondary | ICD-10-CM

## 2018-09-27 DIAGNOSIS — M6281 Muscle weakness (generalized): Secondary | ICD-10-CM

## 2018-09-27 DIAGNOSIS — Z9181 History of falling: Secondary | ICD-10-CM | POA: Diagnosis not present

## 2018-09-27 NOTE — Therapy (Signed)
[place] MAIN [facility] SERVICES [address] [place], [place], [address] Phone: [phone]   Fax:  [phone]  Physical Therapy Treatment  Patient Details  Name: Penny Hart MRN: [id number] Date of Birth: 1945/11/22 Referring Provider (PT): Jennings Books, MD   Encounter Date: 09/27/2018  PT End of Session - 09/27/18 0911    Visit Number  12    Number of Visits  17    Date for PT Re-Evaluation  10/09/18    Authorization Type  2/10 (Eval 08/13/18) (progress note done visit 8 on 2/18)    PT Start Time  0920    PT Stop Time  1003    PT Time Calculation (min)  43 min    Equipment Utilized During Treatment  Gait belt    Activity Tolerance  Patient tolerated treatment well    Behavior During Therapy  [facility] for tasks assessed/performed       Past Medical History:  Diagnosis Date  . Arthritis   . CHF (congestive heart failure) ([place])   . COPD (chronic obstructive pulmonary disease) ([place])   . Diabetes mellitus without complication ([place])   . GERD (gastroesophageal reflux disease)   . Glaucoma   . Hiatal hernia   . Hyperlipidemia   . Hypertension   . Neuropathy   . Sleep apnea   . Spondylolisthesis of lumbar region     Past Surgical History:  Procedure Laterality Date  . ABDOMINAL HYSTERECTOMY    . BREAST EXCISIONAL BIOPSY Left yrs ago    benign  . COLONOSCOPY WITH PROPOFOL N/A 05/30/2015   Procedure: COLONOSCOPY WITH PROPOFOL;  Surgeon: Lucilla Lame, MD;  Location: [facility] ENDOSCOPY;  Service: Endoscopy;  Laterality: N/A;  . ESOPHAGOGASTRODUODENOSCOPY (EGD) WITH PROPOFOL N/A 05/30/2015   Procedure: ESOPHAGOGASTRODUODENOSCOPY (EGD) WITH PROPOFOL;  Surgeon: Lucilla Lame, MD;  Location: [facility] ENDOSCOPY;  Service: Endoscopy;  Laterality: N/A;  . EYE SURGERY     Bialteral for glaucoma  . Eye surgery for glaucoma    . JOINT REPLACEMENT     Left  . Left total knee replacement    . OOPHORECTOMY    . POSTERIOR LUMBAR FUSION  04/27/2017  . TUBAL  LIGATION      Vitals:   09/27/18 0910  BP: 123/70  Pulse: (!) 54    Subjective Assessment - 09/27/18 0910    Subjective  Patient states that yesterday morning she was very dizzy ("as if I was drunk"); she feels this may have been because she got up too quickly due to vivid dreams about loved ones. Patient denies any increased dizziness today, but she has sharp pain in her L low back (3/10) that is new. Patient also reports L knee pain. Patient reports that she spoke to her MD about her increased s/s with respect to her medications, and the MD said her medications were not likely the culprit.    Pertinent History  Co-morbidities: COPD, CHF (diastolic), OA, glaucoma, DM, neuropathy, HTN, hyperlipidemia; currently taking beta-blocker therefore dampened HR response to activity; surgical hx for lumbar fusion, L TKA    Currently in Pain?  Yes    Pain Score  3     Pain Location  Back    Pain Orientation  Left;Lower    Pain Descriptors / Indicators  Sharp    Multiple Pain Sites  Yes    Pain Location  Knee    Pain Orientation  Left    Pain Descriptors / Indicators  Aching  TREATMENT  Pre-treatment assessment: No headache nor dizziness reported.  Neuromuscular Re-education: // bars: Semi-tandem stance, firm surface, no UE support, SBA, x45 sec each leg Narrow BOS, firm surface, no UE support, SBA, with head turns vertical dan horizontal x60 sec  Normal BOS, foam surface head turns horizonal 20x each side, CGA/SBA, no UE support Normal BOS, foam surface, vertical head turns 20x, CGA/SBA, no UE support  Narrow BOS, foam surface head turns horizonal 10x, CGA, intermittent UE support  Narrow BOS, foam surface, vertical head turns 10x, CGA, intermittent UE support  Normal BOS, foam surface, UE challenge (2# therabar lifts) with gaze follow, x10 Narrow BOS, foam surface, UE challenge (2# therabar lifts) with gaze follow, x10 Normal BOS, foam surface, rotational challenge (ball passes)  with gaze follow, x10 each side  AMB in hall 6x62ft with horizontal scanning for card naming. Patient had mild lateral drift in her gait during last repetitions.  Post-treatment assessment: No headache or dizziness reported.   Patient educated throughout session on appropriate technique and form using multi-modal cueing. Patient articulated understanding and returned demonstration.  Patient Response to interventions: Patient reports that she knows physical therapy is helping her legs get stronger, but she still expresses distress over her daily R sided headaches which typically come on when she is in a supine or sitting position. Patient was encouraged to track her activity and position for headache onset as well as duration of headache.   ASSESSMENT Patient presents to clinic with excellent motivation to participate in therapy. Patient demonstrates deficits in balance, strength, and mobility as evidenced by lateral drifting during gait with head turns and reliance on CGA/ intermittent upper extremity support for balance activities on a compliant surface with a narrow base of support. Patient able to achieve UE weighted tasks standing on a compliant surface with no LOB during today's session and responded positively to other neuromuscular re-education interventions. Patient will benefit from continued skilled therapeutic intervention to address remaining deficits in balance, strength, and mobility in order to decrease risk of falls, increase function, and improve overall QOL.    PT Short Term Goals - 09/20/18 1019      PT SHORT TERM GOAL #1   Title  Patient will be independent with HEP for improved therapeutic gains and decreased risk of falls.    Baseline  IE: not provided yet, pt will need closer supervision to peform which is not readily available.     Time  4    Period  Weeks    Status  Deferred    Target Date  09/11/18        PT Long Term Goals - 09/20/18 1020      PT LONG TERM  GOAL #1   Title  Pt will improve DGI by at least 3 points in order to demonstrate clinically significant improvement in balance and decreased risk for falls.    Baseline  IE: 15/24; DGI on 2/27 19/24.     Time  8    Period  Weeks    Status  On-going    Target Date  10/09/18      PT LONG TERM GOAL #2   Title  Pt will decrease DHI score by at least 18 points (45 points) in order to demonstrate clinically significant reduction in disability.     Baseline  IE: not taken 2/2 to fatigue/headache 1/23: 63 (dizziness/HA unchanged, pt is a limited historian)    Time  8    Period  Weeks  Status  Deferred    Target Date  10/09/18      PT LONG TERM GOAL #3   Title  Patient will require no external support to maintain balance when walking for greater than 5 minutes in order to improve safety in the community and decrease risk of falls.     Baseline  IE: patient had LOB at 3 minutes, required PT support and seated rest break to decrease dizziness; 2/18: pt able to complete without LOB, but gluteal insuficicency on Right gradually breask down gait quality andmotor control potential    Time  8    Period  Weeks    Status  Achieved      PT LONG TERM GOAL #4   Title  Pt will decrease 5TSTS by at least 3 seconds in order to demonstrate clinically significant improvement in LE strength and decrease risk of recurrent falls.    Baseline  IE: 19.7 sec, UE assist on thighs; 2/27: 18.49sec hands free     Time  8    Period  Weeks    Status  On-going    Target Date  10/09/18            Plan - 09/27/18 1012    Clinical Impression Statement  Patient presents to clinic with excellent motivation to participate in therapy. Patient demonstrates deficits in balance, strength, and mobility as evidenced by lateral drifting during gait with head turns and reliance on CGA/ intermittent upper extremity support for balance activities on a compliant surface with a narrow base of support. Patient able to achieve UE  weighted tasks standing on a compliant surface with no LOB during today's session and responded positively to other neuromuscular re-education interventions. Patient will benefit from continued skilled therapeutic intervention to address remaining deficits in balance, strength, and mobility in order to decrease risk of falls, increase function, and improve overall QOL.    Rehab Potential  Fair    PT Frequency  2x / week    PT Duration  8 weeks    PT Treatment/Interventions  ADLs/Self Care Home Management;Canalith Repostioning;Cryotherapy;Moist Heat;Gait training;Stair training;Functional mobility training;Therapeutic activities;Therapeutic exercise;Balance training;Neuromuscular re-education;Patient/family education;Manual techniques;Dry needling;Scar mobilization;Passive range of motion;Energy conservation;Taping;Vestibular    PT Next Visit Plan  Continue with gait base balance training. Concussion screening and vestibular exam with Corene Cornea.     PT Home Exercise Plan  No updates at this time.     Consulted and Agree with Plan of Care  Patient    Family Member Consulted  Daughter       Patient will benefit from skilled therapeutic intervention in order to improve the following deficits and impairments:  Abnormal gait, Dizziness, Improper body mechanics, Pain, Decreased coordination, Decreased mobility, Postural dysfunction, Decreased strength, Decreased endurance, Decreased activity tolerance, Difficulty walking, Decreased safety awareness, Decreased balance, Impaired vision/preception, Impaired sensation, Cardiopulmonary status limiting activity  Visit Diagnosis: History of falling  Abnormal posture  Muscle weakness (generalized)     Problem List Patient Active Problem List   Diagnosis Date Noted  . Surgery, elective   . Chronic diastolic congestive heart failure ([place])   . Chronic obstructive pulmonary disease ([place])   . Diabetes mellitus type 2 in nonobese ([facility])   . Benign essential HTN    . Post-operative pain   . Hyponatremia   . Leukocytosis   . Spondylolisthesis of lumbar region 04/27/2017  . Blood in stool   . Hiatal hernia   . Benign neoplasm of cecum   . Benign neoplasm of  transverse colon   . Diverticulosis of large intestine without diverticulitis   . Rectal bleeding 05/29/2015  . Non-intractable cyclical vomiting with nausea    Myles Gip PT, DPT [phone] 09/27/2018, 10:13 AM  [place] MAIN [facility] SERVICES [place], [place], [address] Phone: [phone]   Fax:  [phone]  Name: Penny Hart MRN: [id number] Date of Birth: 05/12/1946

## 2018-10-01 ENCOUNTER — Ambulatory Visit: Payer: Medicare Other

## 2018-10-01 VITALS — BP 126/72 | HR 46

## 2018-10-01 DIAGNOSIS — M6281 Muscle weakness (generalized): Secondary | ICD-10-CM

## 2018-10-01 DIAGNOSIS — R293 Abnormal posture: Secondary | ICD-10-CM

## 2018-10-01 DIAGNOSIS — Z9181 History of falling: Secondary | ICD-10-CM

## 2018-10-01 NOTE — Therapy (Signed)
[place] MAIN [facility] SERVICES [address] [place], [place], [address] Phone: [phone]   Fax:  [phone]  Physical Therapy Treatment  Patient Details  Name: Penny Hart MRN: [id number] Date of Birth: Dec 13, 1945 Referring Provider (PT): Jennings Books, MD   Encounter Date: 10/01/2018  PT End of Session - 10/01/18 1446    Visit Number  13    Number of Visits  17    Date for PT Re-Evaluation  10/09/18    Authorization Type  3/10 (Eval 08/13/18) (progress note done visit 8 on 2/18)    PT Start Time  1446    PT Stop Time  1530    PT Time Calculation (min)  44 min    Equipment Utilized During Treatment  Gait belt    Activity Tolerance  Patient tolerated treatment well    Behavior During Therapy  [facility] for tasks assessed/performed       Past Medical History:  Diagnosis Date  . Arthritis   . CHF (congestive heart failure) ([place])   . COPD (chronic obstructive pulmonary disease) ([place])   . Diabetes mellitus without complication ([place])   . GERD (gastroesophageal reflux disease)   . Glaucoma   . Hiatal hernia   . Hyperlipidemia   . Hypertension   . Neuropathy   . Sleep apnea   . Spondylolisthesis of lumbar region     Past Surgical History:  Procedure Laterality Date  . ABDOMINAL HYSTERECTOMY    . BREAST EXCISIONAL BIOPSY Left yrs ago    benign  . COLONOSCOPY WITH PROPOFOL N/A 05/30/2015   Procedure: COLONOSCOPY WITH PROPOFOL;  Surgeon: Lucilla Lame, MD;  Location: [facility] ENDOSCOPY;  Service: Endoscopy;  Laterality: N/A;  . ESOPHAGOGASTRODUODENOSCOPY (EGD) WITH PROPOFOL N/A 05/30/2015   Procedure: ESOPHAGOGASTRODUODENOSCOPY (EGD) WITH PROPOFOL;  Surgeon: Lucilla Lame, MD;  Location: [facility] ENDOSCOPY;  Service: Endoscopy;  Laterality: N/A;  . EYE SURGERY     Bialteral for glaucoma  . Eye surgery for glaucoma    . JOINT REPLACEMENT     Left  . Left total knee replacement    . OOPHORECTOMY    . POSTERIOR LUMBAR FUSION  04/27/2017  . TUBAL  LIGATION      There were no vitals filed for this visit.  Subjective Assessment - 10/01/18 1619    Subjective  Patient stated that she has some dizziness today, no complaints of pain. Stated her dizziness is worse with sitting down.    Patient is accompained by:  Family member    Pertinent History  Co-morbidities: COPD, CHF (diastolic), OA, glaucoma, DM, neuropathy, HTN, hyperlipidemia; currently taking beta-blocker therefore dampened HR response to activity; surgical hx for lumbar fusion, L TKA    Limitations  Lifting;Standing;Walking;House hold activities;Reading    Diagnostic tests  MRI: No acute or reversible finding. Mild small vessel change of the cerebral hemispheric white matter.    Patient Stated Goals  get rid of headaches, not feel dizzy    Currently in Pain?  No/denies      TREATMENT     Neuromuscular Re-education: // bars: Semi-tandem stance, firm surface, no UE support, CGA x45 sec each leg Semi tandem, firm surface, no UE support, CGA, with head turns vertical dan horizontal x60 sec  semi tandem eyes closed on firm surface 2x45secs Narrow BOS, foam surface head turns horizonal 45sec, CGA, intermittent UE support  Narrow BOS, foam surface, vertical head turns 45sec CGA, intermittent UE support  Normal BOS, foam surface, UE challenge (2# therabar  lifts) with gaze follow, x10 Narrow BOS, foam surface, UE challenge (2# therabar lifts) with gaze follow, x10 Normal BOS, foam surface, rotational challenge (ball passes) with gaze follow, x10 each side Lateral stepping in // no UE suport x4lengths Step ups onto blue foam x10 ea side, extensive verbal/tactile cueing for coordination Heel and toe raises on half blue foam roll with single UE support, occasional bilateral support x10    AMB in hall 4x60ft with horizontal scanning for card naming. Patient had mild lateral drift in her gait throughout ambulation.     Patient educated throughout session on appropriate technique and  form using multi-modal cueing. Patient articulated understanding and returned demonstration. CGA for all balance activities this session.        PT Education - 10/01/18 1445    Education Details  exercise technique, balance strategies    Person(s) Educated  Patient    Methods  Explanation;Tactile cues;Verbal cues;Demonstration    Comprehension  Verbalized understanding;Returned demonstration;Verbal cues required;Tactile cues required       PT Short Term Goals - 09/20/18 1019      PT SHORT TERM GOAL #1   Title  Patient will be independent with HEP for improved therapeutic gains and decreased risk of falls.    Baseline  IE: not provided yet, pt will need closer supervision to peform which is not readily available.     Time  4    Period  Weeks    Status  Deferred    Target Date  09/11/18        PT Long Term Goals - 09/20/18 1020      PT LONG TERM GOAL #1   Title  Pt will improve DGI by at least 3 points in order to demonstrate clinically significant improvement in balance and decreased risk for falls.    Baseline  IE: 15/24; DGI on 2/27 19/24.     Time  8    Period  Weeks    Status  On-going    Target Date  10/09/18      PT LONG TERM GOAL #2   Title  Pt will decrease DHI score by at least 18 points (45 points) in order to demonstrate clinically significant reduction in disability.     Baseline  IE: not taken 2/2 to fatigue/headache 1/23: 63 (dizziness/HA unchanged, pt is a limited historian)    Time  8    Period  Weeks    Status  Deferred    Target Date  10/09/18      PT LONG TERM GOAL #3   Title  Patient will require no external support to maintain balance when walking for greater than 5 minutes in order to improve safety in the community and decrease risk of falls.     Baseline  IE: patient had LOB at 3 minutes, required PT support and seated rest break to decrease dizziness; 2/18: pt able to complete without LOB, but gluteal insuficicency on Right gradually breask  down gait quality andmotor control potential    Time  8    Period  Weeks    Status  Achieved      PT LONG TERM GOAL #4   Title  Pt will decrease 5TSTS by at least 3 seconds in order to demonstrate clinically significant improvement in LE strength and decrease risk of recurrent falls.    Baseline  IE: 19.7 sec, UE assist on thighs; 2/27: 18.49sec hands free     Time  8  Period  Weeks    Status  On-going    Target Date  10/09/18            Plan - 10/01/18 1623    Clinical Impression Statement  Session focused on promoting balance strategies and LE strengthening. Patient needed CGA and moderate verbal/visual cues for correct exercise technique/form. Patient most challenged by unstable surfaces and narrow base of support activities. Of note, pt HR and spO2 monitored throughout session. HR in mid 40's at start of session, in mid 60's during ambulation, but did return to mid 40s with rest. Pt instructed to call physician, and PT also placed a call for physicians office. The patient would benefit from further skilled PT to continue to progress towards goals. Recommendation to monitor HR during next session as well.     Rehab Potential  Fair    PT Frequency  2x / week    PT Duration  8 weeks    PT Treatment/Interventions  ADLs/Self Care Home Management;Canalith Repostioning;Cryotherapy;Moist Heat;Gait training;Stair training;Functional mobility training;Therapeutic activities;Therapeutic exercise;Balance training;Neuromuscular re-education;Patient/family education;Manual techniques;Dry needling;Scar mobilization;Passive range of motion;Energy conservation;Taping;Vestibular    PT Next Visit Plan  Continue with gait base balance training. Concussion screening and vestibular exam with Corene Cornea.     PT Home Exercise Plan  No updates at this time.     Consulted and Agree with Plan of Care  Patient       Patient will benefit from skilled therapeutic intervention in order to improve the following  deficits and impairments:  Abnormal gait, Dizziness, Improper body mechanics, Pain, Decreased coordination, Decreased mobility, Postural dysfunction, Decreased strength, Decreased endurance, Decreased activity tolerance, Difficulty walking, Decreased safety awareness, Decreased balance, Impaired vision/preception, Impaired sensation, Cardiopulmonary status limiting activity  Visit Diagnosis: History of falling  Abnormal posture  Muscle weakness (generalized)     Problem List Patient Active Problem List   Diagnosis Date Noted  . Surgery, elective   . Chronic diastolic congestive heart failure ([place])   . Chronic obstructive pulmonary disease ([place])   . Diabetes mellitus type 2 in nonobese ([facility])   . Benign essential HTN   . Post-operative pain   . Hyponatremia   . Leukocytosis   . Spondylolisthesis of lumbar region 04/27/2017  . Blood in stool   . Hiatal hernia   . Benign neoplasm of cecum   . Benign neoplasm of transverse colon   . Diverticulosis of large intestine without diverticulitis   . Rectal bleeding 05/29/2015  . Non-intractable cyclical vomiting with nausea     Lieutenant Diego PT, DPT 4:27 PM,10/01/18 [place] MAIN [facility] SERVICES [place], [place], [address] Phone: [phone]   Fax:  [phone]  Name: SHENAYA LEBO MRN: [id number] Date of Birth: 1945/11/28

## 2018-10-03 ENCOUNTER — Ambulatory Visit: Payer: Medicare Other

## 2018-10-03 VITALS — BP 139/78 | HR 64

## 2018-10-03 DIAGNOSIS — M6281 Muscle weakness (generalized): Secondary | ICD-10-CM

## 2018-10-03 DIAGNOSIS — Z9181 History of falling: Secondary | ICD-10-CM | POA: Diagnosis not present

## 2018-10-03 NOTE — Therapy (Signed)
[place] MAIN [facility] SERVICES [address] [place], [place], [address] Phone: [phone]   Fax:  [phone]  Physical Therapy Treatment  Patient Details  Name: Penny Hart MRN: [id number] Date of Birth: [birth year] Referring Provider (PT): Jennings Books, MD   Encounter Date: 10/03/2018  PT End of Session - 10/03/18 0859    Visit Number  14    Number of Visits  17    Date for PT Re-Evaluation  10/09/18    Authorization Type  Eval 08/13/18    PT Start Time  0855    PT Stop Time  0930    PT Time Calculation (min)  35 min    Equipment Utilized During Treatment  Gait belt    Activity Tolerance  Patient tolerated treatment well    Behavior During Therapy  [facility] for tasks assessed/performed       Past Medical History:  Diagnosis Date  . Arthritis   . CHF (congestive heart failure) ([place])   . COPD (chronic obstructive pulmonary disease) ([place])   . Diabetes mellitus without complication ([place])   . GERD (gastroesophageal reflux disease)   . Glaucoma   . Hiatal hernia   . Hyperlipidemia   . Hypertension   . Neuropathy   . Sleep apnea   . Spondylolisthesis of lumbar region     Past Surgical History:  Procedure Laterality Date  . ABDOMINAL HYSTERECTOMY    . BREAST EXCISIONAL BIOPSY Left yrs ago    benign  . COLONOSCOPY WITH PROPOFOL N/A 05/30/2015   Procedure: COLONOSCOPY WITH PROPOFOL;  Surgeon: Lucilla Lame, MD;  Location: [facility] ENDOSCOPY;  Service: Endoscopy;  Laterality: N/A;  . ESOPHAGOGASTRODUODENOSCOPY (EGD) WITH PROPOFOL N/A 05/30/2015   Procedure: ESOPHAGOGASTRODUODENOSCOPY (EGD) WITH PROPOFOL;  Surgeon: Lucilla Lame, MD;  Location: [facility] ENDOSCOPY;  Service: Endoscopy;  Laterality: N/A;  . EYE SURGERY     Bialteral for glaucoma  . Eye surgery for glaucoma    . JOINT REPLACEMENT     Left  . Left total knee replacement    . OOPHORECTOMY    . POSTERIOR LUMBAR FUSION  04/27/2017  . TUBAL LIGATION      Vitals:   10/03/18 0856   BP: 139/78  Pulse: 64  SpO2: 97%    Subjective Assessment - 10/03/18 0858    Subjective  Pt reports that she called her PCP after her last visit and she was advised to stop her beta-blocker. She reports intermittent dizziness since her fall in August. Otherwise no specific questions or concerns at this time.     Patient is accompained by:  Family member    Pertinent History  Co-morbidities: COPD, CHF (diastolic), OA, glaucoma, DM, neuropathy, HTN, hyperlipidemia; currently taking beta-blocker therefore dampened HR response to activity; surgical hx for lumbar fusion, L TKA    Limitations  Lifting;Standing;Walking;House hold activities;Reading    Diagnostic tests  MRI: No acute or reversible finding. Mild small vessel change of the cerebral hemispheric white matter.    Patient Stated Goals  get rid of headaches, not feel dizzy    Currently in Pain?  No/denies            HISTORY:  Description of dizziness: (vertigo, unsteadiness, lightheadedness, falling, general unsteadiness, whoozy, swimmy-headed sensation, aural fullness) "I feel drunk" Frequency: Daily Duration: "minutes" Symptom nature: (motion provoked, positional, spontaneous, constant, variable, intermittent)   Provocative Factors: Unknown Easing Factors: Unknown  Progression of symptoms: (better, worse, no change since onset) Worse History of similar episodes: No  Falls (yes/no): Yes, Number of falls in past 6 months: 1  Auditory complaints (tinnitus, pain, drainage): L tinnitus for years, no hearing loss Vision (last eye exam, diplopia, recent changes): Worsening vision in R eye secondary to glaucoma.     EXAMINATION  COORDINATION: Finger to Nose: Normal Heel to Shin: Normal Pronator Drift: Negative Rapid Alternating Movement: Normal Finger Opposition: Normal   MUSCULOSKELETAL SCREEN: Cervical Spine ROM: WFL and painless in all planes. No gross deficits identified    OCULOMOTOR / VESTIBULAR  TESTING:  Oculomotor Exam- Room Light  Findings Comments  Ocular Alignment normal   Ocular ROM normal   Spontaneous Nystagmus normal   End-Gaze Nystagmus normal   Smooth Pursuit abnormal Saccadic  Saccades abnormal Consistently pt required multiple corrections to reach target horizontally and at times it is unclear if she is currently seeing target at all  VOR normal   VOR Cancellation normal   Left Head Thrust normal   Right Head Thrust normal   Head Shaking Nystagmus not examined   Static Acuity abnormal Pt has trouble with vertical peripheral vision as well as midrange visual field loss on both R and L. Pt with history of glaucoma  Dynamic Acuity not examined     Oculomotor Exam- Fixation Suppressed  Findings Comments  Ocular Alignment normal   Ocular ROM normal   Spontaneous Nystagmus normal   End-Gaze Nystagmus abnormal R horizontal nystagmus with R gaze, L horizontal nystagmus with L gaze, vertical nystagmus with vertical gaze  Head Shaking Nystagmus normal     BPPV TESTS:  Symptoms Duration Intensity Nystagmus  L Dix-Hallpike None   None  R Dix-Hallpike None   None  L Head Roll None   None  R Head Roll None   None  L Sidelying Test      R Sidelying Test          Pt arrived late for her appointment so the session was abbreviated. Vestibular screening performed on patient. Most notable finding is her significant visual deficits. Pt regularly follows up with her eye doctor and encouragement provided to continue to do so if she notices any worsening of her vision. Otherwise her oculomotor/vestibular is consistent with central deficits. She consistently requires multiple corrections to reach target horizontally during saccade testing. At times it is unclear if she is currently seeing target at all. With infrared goggles pt has R horizontal nystagmus with R gaze, L horizontal nystagmus with L gaze, vertical nystagmus with vertical gaze. Pt encouraged to follow-up with  physical therapy for more habituation training exercises to see if she can experience a greater reduction in her subjective dizziness. Will need re certification at next visit. Pt will benefit from PT services to address deficits in strength, balance, and mobility in order to return to full function at home.                      PT Short Term Goals - 09/20/18 1019      PT SHORT TERM GOAL #1   Title  Patient will be independent with HEP for improved therapeutic gains and decreased risk of falls.    Baseline  IE: not provided yet, pt will need closer supervision to peform which is not readily available.     Time  4    Period  Weeks    Status  Deferred    Target Date  09/11/18        PT Long Term Goals - 09/20/18 1020  PT LONG TERM GOAL #1   Title  Pt will improve DGI by at least 3 points in order to demonstrate clinically significant improvement in balance and decreased risk for falls.    Baseline  IE: 15/24; DGI on 2/27 19/24.     Time  8    Period  Weeks    Status  On-going    Target Date  10/09/18      PT LONG TERM GOAL #2   Title  Pt will decrease DHI score by at least 18 points (45 points) in order to demonstrate clinically significant reduction in disability.     Baseline  IE: not taken 2/2 to fatigue/headache 1/23: 63 (dizziness/HA unchanged, pt is a limited historian)    Time  8    Period  Weeks    Status  Deferred    Target Date  10/09/18      PT LONG TERM GOAL #3   Title  Patient will require no external support to maintain balance when walking for greater than 5 minutes in order to improve safety in the community and decrease risk of falls.     Baseline  IE: patient had LOB at 3 minutes, required PT support and seated rest break to decrease dizziness; 2/18: pt able to complete without LOB, but gluteal insuficicency on Right gradually breask down gait quality andmotor control potential    Time  8    Period  Weeks    Status  Achieved      PT LONG  TERM GOAL #4   Title  Pt will decrease 5TSTS by at least 3 seconds in order to demonstrate clinically significant improvement in LE strength and decrease risk of recurrent falls.    Baseline  IE: 19.7 sec, UE assist on thighs; 2/27: 18.49sec hands free     Time  8    Period  Weeks    Status  On-going    Target Date  10/09/18            Plan - 10/03/18 0900    Clinical Impression Statement  Pt arrived late for her appointment so the session was abbreviated. Vestibular screening performed on patient. Most notable finding is her significant visual deficits. Pt regularly follows up with her eye doctor and encouragement provided to continue to do so if she notices any worsening of her vision. Otherwise her oculomotor/vestibular is consistent with central deficits. She consistently requires multiple corrections to reach target horizontally during saccade testing. At times it is unclear if she is currently seeing target at all. With infrared goggles pt has R horizontal nystagmus with R gaze, L horizontal nystagmus with L gaze, vertical nystagmus with vertical gaze. Pt encouraged to follow-up with physical therapy for more habituation training exercises to see if she can experience a greater reduction in her subjective dizziness. Will need re certification at next visit. Pt will benefit from PT services to address deficits in strength, balance, and mobility in order to return to full function at home.     Rehab Potential  Fair    PT Frequency  2x / week    PT Duration  8 weeks    PT Treatment/Interventions  ADLs/Self Care Home Management;Canalith Repostioning;Cryotherapy;Moist Heat;Gait training;Stair training;Functional mobility training;Therapeutic activities;Therapeutic exercise;Balance training;Neuromuscular re-education;Patient/family education;Manual techniques;Dry needling;Scar mobilization;Passive range of motion;Energy conservation;Taping;Vestibular    PT Next Visit Plan  Recertification,  continue with gait base balance training including habitutation exercises.    PT Home Exercise Plan  No updates at this  time.     Consulted and Agree with Plan of Care  Patient       Patient will benefit from skilled therapeutic intervention in order to improve the following deficits and impairments:  Abnormal gait, Dizziness, Improper body mechanics, Pain, Decreased coordination, Decreased mobility, Postural dysfunction, Decreased strength, Decreased endurance, Decreased activity tolerance, Difficulty walking, Decreased safety awareness, Decreased balance, Impaired vision/preception, Impaired sensation, Cardiopulmonary status limiting activity  Visit Diagnosis: History of falling  Muscle weakness (generalized)     Problem List Patient Active Problem List   Diagnosis Date Noted  . Surgery, elective   . Chronic diastolic congestive heart failure ([place])   . Chronic obstructive pulmonary disease ([place])   . Diabetes mellitus type 2 in nonobese ([facility])   . Benign essential HTN   . Post-operative pain   . Hyponatremia   . Leukocytosis   . Spondylolisthesis of lumbar region 04/27/2017  . Blood in stool   . Hiatal hernia   . Benign neoplasm of cecum   . Benign neoplasm of transverse colon   . Diverticulosis of large intestine without diverticulitis   . Rectal bleeding 05/29/2015  . Non-intractable cyclical vomiting with nausea    Phillips Grout PT, DPT, GCS  , 10/03/2018, 12:59 PM  [place] MAIN [facility] SERVICES [address] [place], [place], [address] Phone: [phone]   Fax:  [phone]  Name: Penny Hart MRN: [id number] Date of Birth: 1946-02-22

## 2018-10-05 ENCOUNTER — Ambulatory Visit: Payer: Medicare Other

## 2018-10-08 ENCOUNTER — Ambulatory Visit: Payer: Medicare Other

## 2018-10-08 ENCOUNTER — Other Ambulatory Visit: Payer: Self-pay

## 2018-10-08 VITALS — BP 150/77 | HR 47

## 2018-10-08 DIAGNOSIS — M6281 Muscle weakness (generalized): Secondary | ICD-10-CM

## 2018-10-08 DIAGNOSIS — Z9181 History of falling: Secondary | ICD-10-CM | POA: Diagnosis not present

## 2018-10-08 NOTE — Therapy (Signed)
[place] MAIN [facility] SERVICES [address] [place], [place], [address] Phone: [phone]   Fax:  [phone]  Physical Therapy Treatment/Recertification  Patient Details  Name: Penny Hart MRN: [id number] Date of Birth: 06-17-1946 Referring Provider (PT): Jennings Books, MD   Encounter Date: 10/08/2018  PT End of Session - 10/08/18 0813    Visit Number  15    Number of Visits  33    Date for PT Re-Evaluation  12/03/18    Authorization Type  Eval 08/13/18    PT Start Time  0801    PT Stop Time  0845    PT Time Calculation (min)  44 min    Equipment Utilized During Treatment  Gait belt    Activity Tolerance  Patient tolerated treatment well    Behavior During Therapy  [facility] for tasks assessed/performed       Past Medical History:  Diagnosis Date  . Arthritis   . CHF (congestive heart failure) ([place])   . COPD (chronic obstructive pulmonary disease) ([place])   . Diabetes mellitus without complication ([place])   . GERD (gastroesophageal reflux disease)   . Glaucoma   . Hiatal hernia   . Hyperlipidemia   . Hypertension   . Neuropathy   . Sleep apnea   . Spondylolisthesis of lumbar region     Past Surgical History:  Procedure Laterality Date  . ABDOMINAL HYSTERECTOMY    . BREAST EXCISIONAL BIOPSY Left yrs ago    benign  . COLONOSCOPY WITH PROPOFOL N/A 05/30/2015   Procedure: COLONOSCOPY WITH PROPOFOL;  Surgeon: Lucilla Lame, MD;  Location: [facility] ENDOSCOPY;  Service: Endoscopy;  Laterality: N/A;  . ESOPHAGOGASTRODUODENOSCOPY (EGD) WITH PROPOFOL N/A 05/30/2015   Procedure: ESOPHAGOGASTRODUODENOSCOPY (EGD) WITH PROPOFOL;  Surgeon: Lucilla Lame, MD;  Location: [facility] ENDOSCOPY;  Service: Endoscopy;  Laterality: N/A;  . EYE SURGERY     Bialteral for glaucoma  . Eye surgery for glaucoma    . JOINT REPLACEMENT     Left  . Left total knee replacement    . OOPHORECTOMY    . POSTERIOR LUMBAR FUSION  04/27/2017  . TUBAL LIGATION      Vitals:    10/08/18 0805  BP: (!) 150/77  Pulse: (!) 47  SpO2: 98%    Subjective Assessment - 10/08/18 0811    Subjective  Pt states that she is doing well on this date. She saw her PCP who recommended that if her HR continues to run low she may need to a halter monitor. She reports a mild headache pain intermittently but not currently upon arrival. No specific questions or concerns currently.     Patient is accompained by:  Family member    Pertinent History  Co-morbidities: COPD, CHF (diastolic), OA, glaucoma, DM, neuropathy, HTN, hyperlipidemia; currently taking beta-blocker therefore dampened HR response to activity; surgical hx for lumbar fusion, L TKA    Limitations  Lifting;Standing;Walking;House hold activities;Reading    Diagnostic tests  MRI: No acute or reversible finding. Mild small vessel change of the cerebral hemispheric white matter.    Patient Stated Goals  get rid of headaches, not feel dizzy    Currently in Pain?  No/denies         TREATMENT   Neuromuscular Re-education: [place] warm-up L2 x 5 minutes during history (2 minutes unbilled); Pt took an extended time to complete [place]; 38/100 (unbilled); // bars: Narrow BOS, firm surface head turns horizonal30sec, CGA; Narrow BOS, firm surface, vertical head turns30sec CGA; Narrow  BOS, firm surface, eyes closed30sec CGA; Narrow BOS, foam surface head turns horizonal30sec, CGA; Narrow BOS, foam surface, vertical head turns30sec CGA; Narrow BOS, foam surface head and shoulder turns horizonal30sec, CGA; Narrow BOS, foam surface, eyes closed30sec CGA; Foam alternating 6" step taps x 10 on each side CGA; VOR x 1 horizontal NBOS 30s, 60s x 2, 1-2/10 dizziness reported (added to HEP); Updated goals with patient and daughter;  Patient educated throughout session on appropriate technique and form using multi-modal cueing.Patient articulated understanding and returned demonstration. CGA for all balance activities this  session.   Updated outcome measures as appropriate as well as goals with patient. DGI last performed on 09/20/18 and it had improved from 15/24 at initial evaluation to 19/24. 5TSTS was slightly faster on 09/20/18 however not a clinically significant change. Pt completed [place] today and it dropped from 63/100 on 08/16/18 to 38/100 today.  Pt continues to be bradycardic upon intake and encouraged pt to contact her PCP to further discuss possible halter monitor to see if her symptoms may be at least in part cardiogenic. Pt issued VOR x 1 horizontal to add to her HEP today. She will benefit from PT services to address deficits in strength, balance, and mobility in order to return to full function at home.                      PT Short Term Goals - 10/08/18 3893      PT SHORT TERM GOAL #1   Title  Patient will be independent with HEP for improved therapeutic gains and decreased risk of falls.    Baseline  IE: not provided yet, pt will need closer supervision to peform which is not readily available.     Time  4    Period  Weeks    Status  On-going    Target Date  11/19/18        PT Long Term Goals - 10/08/18 0814      PT LONG TERM GOAL #1   Title  Pt will improve DGI by at least 3 points in order to demonstrate clinically significant improvement in balance and decreased risk for falls.    Baseline  IE: 15/24; DGI on 2/27 19/24.     Time  8    Period  Weeks    Status  Achieved    Target Date  --      PT LONG TERM GOAL #2   Title  Pt will decrease DHI score by at least 18 points (45 points) in order to demonstrate clinically significant reduction in disability.     Baseline  IE: not taken 2/2 to fatigue/headache 1/23: 63 (dizziness/HA unchanged, pt is a limited historian); 10/08/18: 38/100    Time  8    Period  Weeks    Status  Achieved      PT LONG TERM GOAL #3   Title  Patient will require no external support to maintain balance when walking for greater than 5 minutes in  order to improve safety in the community and decrease risk of falls.     Baseline  IE: patient had LOB at 3 minutes, required PT support and seated rest break to decrease dizziness; 2/18: pt able to complete without LOB, but gluteal insuficicency on Right gradually breask down gait quality andmotor control potential    Time  8    Period  Weeks    Status  Deferred    Target Date  12/03/18  PT LONG TERM GOAL #4   Title  Pt will decrease 5TSTS by at least 3 seconds in order to demonstrate clinically significant improvement in LE strength and decrease risk of recurrent falls.    Baseline  IE: 19.7 sec, UE assist on thighs; 2/27: 18.49sec hands free     Time  8    Period  Weeks    Status  On-going    Target Date  12/03/18            Plan - 10/08/18 0814    Clinical Impression Statement  Updated outcome measures as appropriate as well as goals with patient. DGI last performed on 09/20/18 and it had improved from 15/24 at initial evaluation to 19/24. 5TSTS was slightly faster on 09/20/18 however not a clinically significant change. Pt completed [place] today and it dropped from 63/100 on 08/16/18 to 38/100 today.  Pt continues to be bradycardic upon intake and encouraged pt to contact her PCP to further discuss possible halter monitor to see if her symptoms may be at least in part cardiogenic. Pt issued VOR x 1 horizontal to add to her HEP today. She will benefit from PT services to address deficits in strength, balance, and mobility in order to return to full function at home.     Rehab Potential  Fair    PT Frequency  2x / week    PT Duration  8 weeks    PT Treatment/Interventions  ADLs/Self Care Home Management;Canalith Repostioning;Cryotherapy;Moist Heat;Gait training;Stair training;Functional mobility training;Therapeutic activities;Therapeutic exercise;Balance training;Neuromuscular re-education;Patient/family education;Manual techniques;Dry needling;Scar mobilization;Passive range of  motion;Energy conservation;Taping;Vestibular    PT Next Visit Plan  Continue with gait based balance training including habitutation exercises.    PT Home Exercise Plan  VOR x 1 horizontal NBOS,     Consulted and Agree with Plan of Care  Patient       Patient will benefit from skilled therapeutic intervention in order to improve the following deficits and impairments:  Abnormal gait, Dizziness, Improper body mechanics, Pain, Decreased coordination, Decreased mobility, Postural dysfunction, Decreased strength, Decreased endurance, Decreased activity tolerance, Difficulty walking, Decreased safety awareness, Decreased balance, Impaired vision/preception, Impaired sensation, Cardiopulmonary status limiting activity  Visit Diagnosis: Muscle weakness (generalized)     Problem List Patient Active Problem List   Diagnosis Date Noted  . Surgery, elective   . Chronic diastolic congestive heart failure ([place])   . Chronic obstructive pulmonary disease ([place])   . Diabetes mellitus type 2 in nonobese (H[facility])   . Benign essential HTN   . Post-operative pain   . Hyponatremia   . Leukocytosis   . Spondylolisthesis of lumbar region 04/27/2017  . Blood in stool   . Hiatal hernia   . Benign neoplasm of cecum   . Benign neoplasm of transverse colon   . Diverticulosis of large intestine without diverticulitis   . Rectal bleeding 05/29/2015  . Non-intractable cyclical vomiting with nausea    Phillips Grout PT, DPT, GCS  Huprich,Jason 10/08/2018, 3:31 PM  [place] MAIN [facility] SERVICES [address] [place], [place], [address] Phone: [phone]   Fax:  [phone]  Name: Penny Hart MRN: [id number] Date of Birth: [birth year]

## 2018-10-12 ENCOUNTER — Ambulatory Visit: Payer: Medicare Other

## 2018-10-16 ENCOUNTER — Ambulatory Visit: Payer: Medicare Other

## 2018-10-16 NOTE — Therapy (Unsigned)
[place] MAIN [facility] SERVICES [address] [place], [place], [address] Phone: [phone]   Fax:  [phone]  Patient Details  Name: Penny Hart MRN: [id number] Date of Birth: 04/06/1946 Referring Provider:  No ref. provider found  Encounter Date: 10/16/2018   DPT called patient to let patient know the status of the clinic and ensure that we will be reaching out to schedule when we re-open. DPT fielded any questions patient had at this time and offered guidance on current home program as necessary. Patient had no further questions at this time, and DPT advised that should questions arise patient can reach out to the clinic for guidance via phone.   Lieutenant Diego PT, DPT 10:55 AM,10/16/18 [place] MAIN [facility] SERVICES [address] [place], [place], [address] Phone: [phone]   Fax:  [phone]

## 2018-10-18 ENCOUNTER — Ambulatory Visit: Payer: Medicare Other

## 2018-10-22 ENCOUNTER — Ambulatory Visit: Payer: Medicare Other

## 2018-10-23 ENCOUNTER — Encounter: Payer: Medicare Other | Admitting: Physical Therapy

## 2018-10-25 ENCOUNTER — Ambulatory Visit: Payer: Medicare Other

## 2018-10-30 ENCOUNTER — Encounter: Payer: Medicare Other | Admitting: Physical Therapy

## 2018-11-02 NOTE — Therapy (Signed)
[place] MAIN [facility] SERVICES [address] [place], [place], [address] Phone: [phone]   Fax:  [phone]  Patient Details  Name: Penny Hart MRN: [id number] Date of Birth: Aug 02, 1945 Referring Provider:  No ref. provider found  Encounter Date: 11/02/2018  The [facility] outpatient clinics are closed at this time due to the COVID-19 epidemic. The patient was contacted in regards to their therapy services. The patient refuses any telehealth therapy services at this time. The patient is in agreement that they are safe and consent to being on hold for therapy services until the [facility] outpatient facilities reopen. At which time, the patient will be contacted to schedule an appointment to resume therapy services.      Phillips Grout PT, DPT, GCS  Penny Hart 11/02/2018, 4:04 PM  [place] MAIN [facility] SERVICES [address] [place], [place], [address] Phone: [phone]   Fax:  [phone]

## 2018-12-03 IMAGING — CT CT HEAD W/O CM
3 series · 14 of 45 positions shown, 16 images · non-contrast
Comparison: June 08, 2015

CLINICAL DATA: Headache following recent fall

EXAM:
CT HEAD WITHOUT CONTRAST
TECHNIQUE: Contiguous axial images were obtained from the base of the skull
through the vertex without intravenous contrast.

[Series 2: head wo · axial · 0.47mm/px · z∈[-144,-29]mm · 8 of 28 slices shown, 10 images]
[im 3/28  brain]
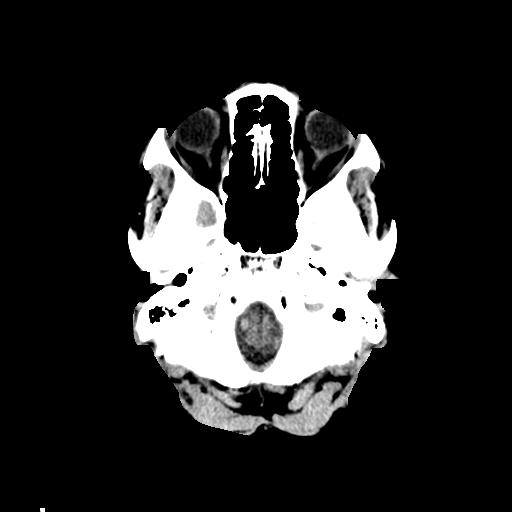
[im 3/28  bone]
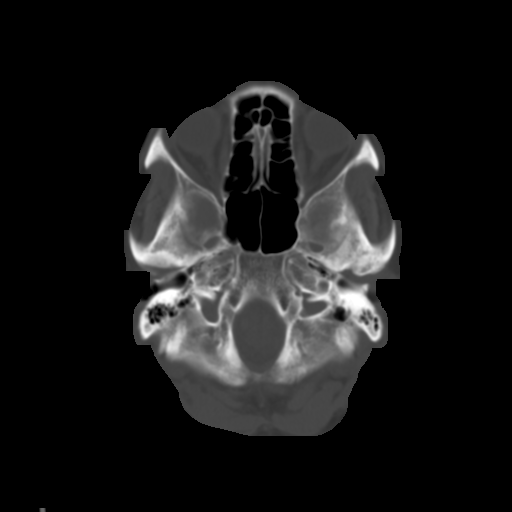
[im 6/28  brain]
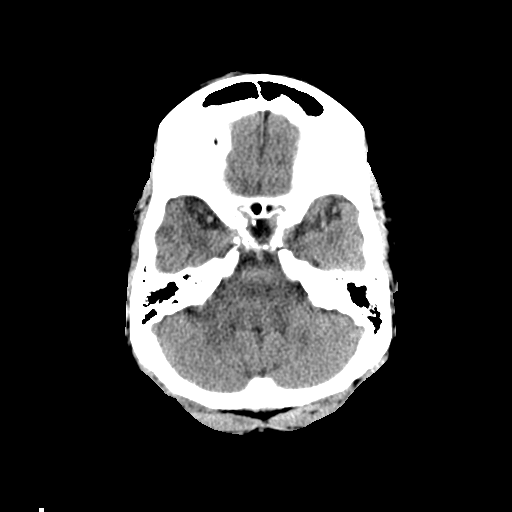
[im 10/28  brain]
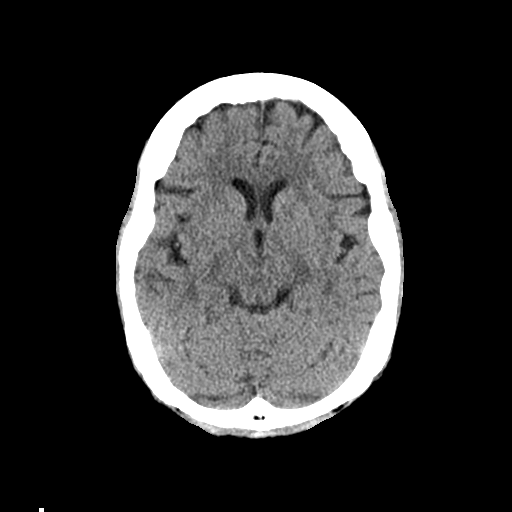
[im 13/28  brain]
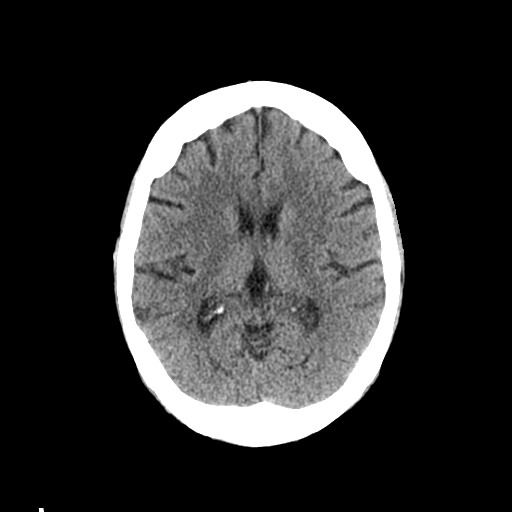
[im 16/28  brain]
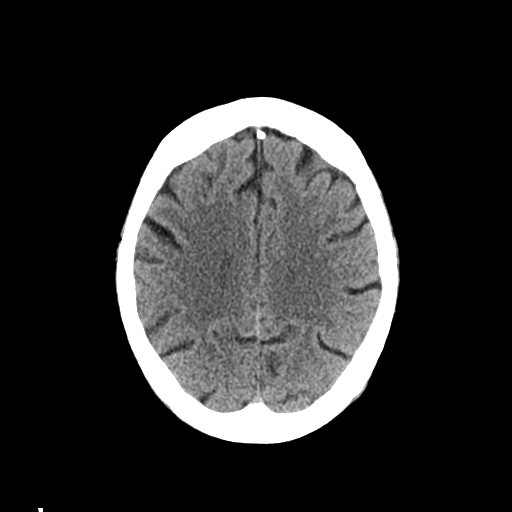
[im 16/28  bone]
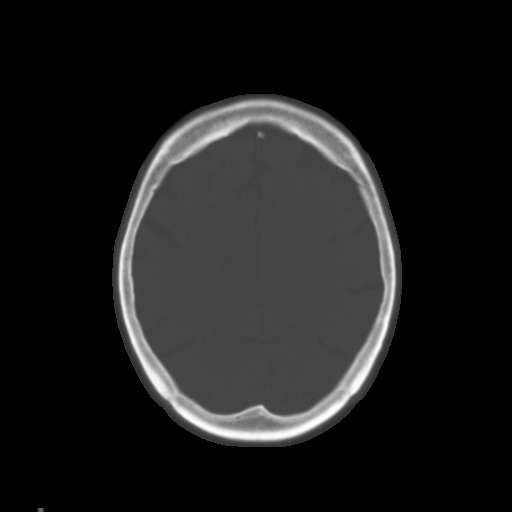
[im 19/28  brain]
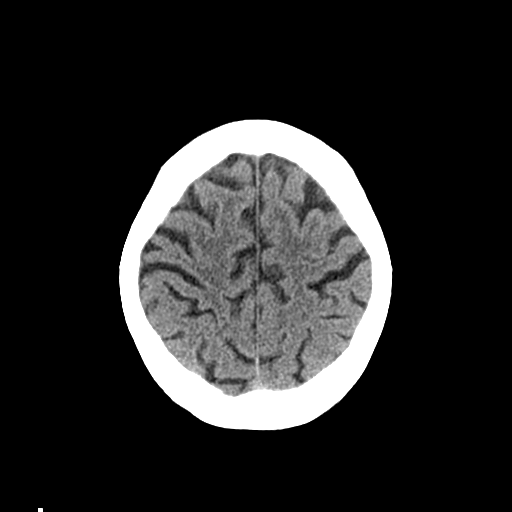
[im 23/28  brain]
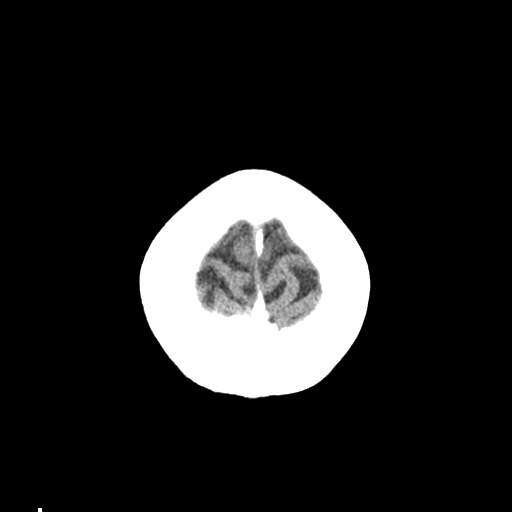
[im 26/28  brain]
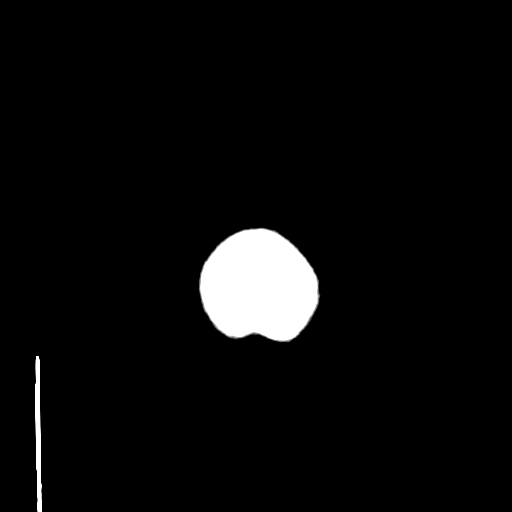

[Series 4: coronal soft tissue · coronal · 0.29mm/px · 3 of 60 slices shown]
[im 20/60  brain]
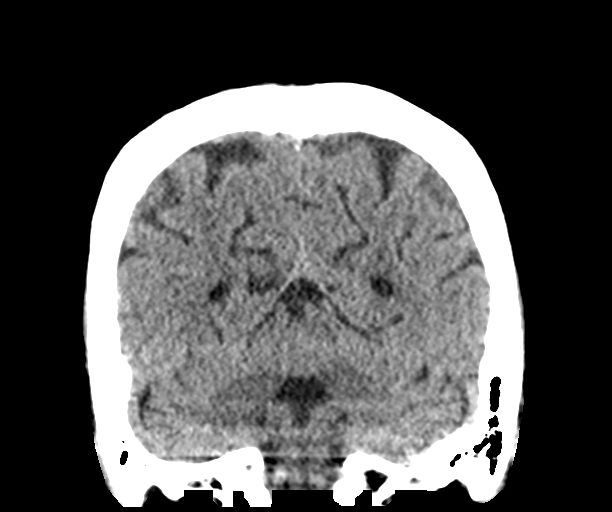
[im 27/60  brain]
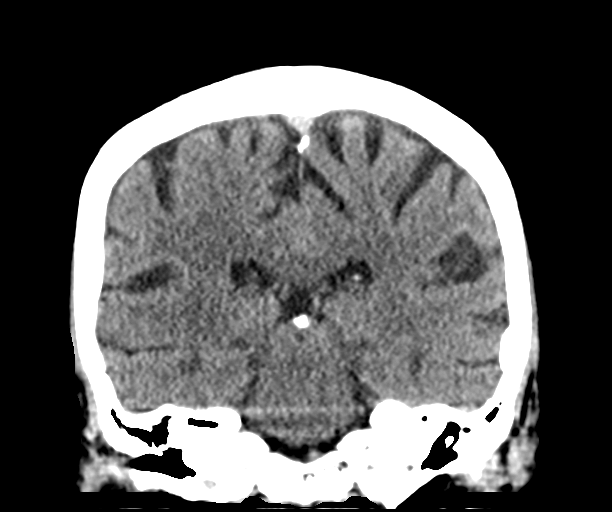
[im 33/60  brain]
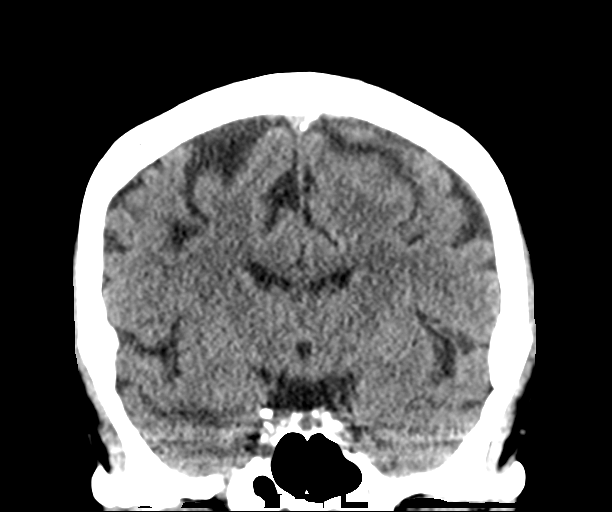

[Series 5: sagittal soft tissue · sagittal · 0.28mm/px · 3 of 50 slices shown]
[im 17/50  brain]
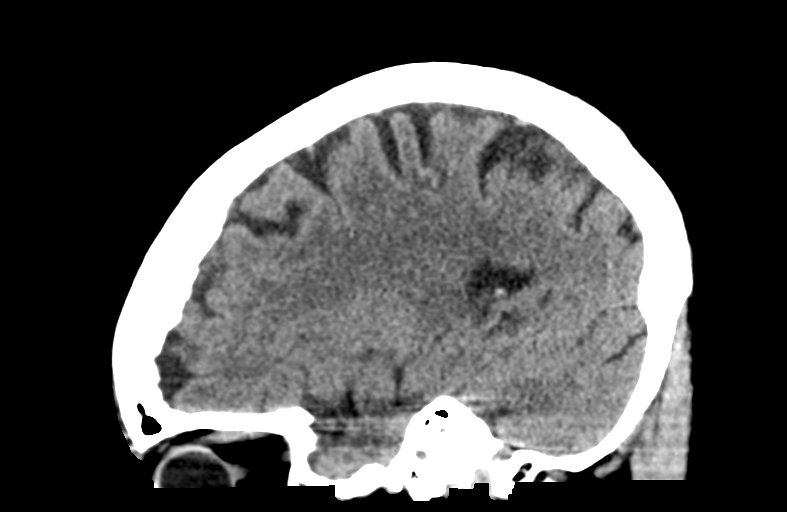
[im 25/50  brain]
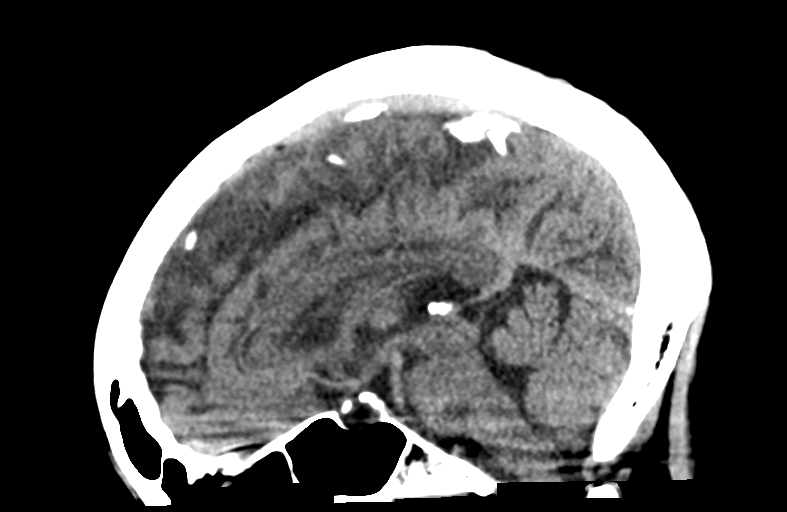
[im 33/50  brain]
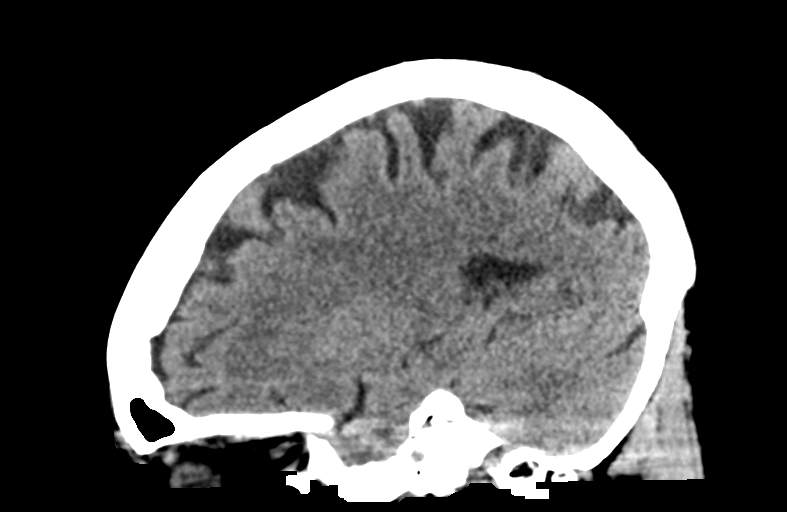

[14 of 45 positions shown; findings below may reference images not displayed]

FINDINGS: Brain: The ventricles are normal in size and configuration. Sulci
appear unremarkable for age. There is no intracranial mass,
hemorrhage, extra-axial fluid collection, or midline shift.
Gray-white compartments appear normal. No acute infarct evident.

Vascular: No hyperdense vessel. There is calcification in each
carotid siphon region.

Skull: Bony calvarium appears intact.

Sinuses/Orbits: There is mucosal thickening in several ethmoid air
cells. Other visualized paranasal sinuses are clear. Visualized
orbits appear symmetric bilaterally. Symmetric foci of calcification
noted in each anterior globe.

Other: Mastoid air cells are clear.
IMPRESSION: No mass or hemorrhage. Gray-white compartments normal. There are
foci of arterial vascular calcification. There is mucosal thickening
in several ethmoid air cells. Stable appearing orbits bilaterally.

## 2019-04-04 ENCOUNTER — Other Ambulatory Visit: Payer: Self-pay

## 2019-04-04 DIAGNOSIS — Z20822 Contact with and (suspected) exposure to covid-19: Secondary | ICD-10-CM

## 2019-04-05 ENCOUNTER — Telehealth: Payer: Self-pay | Admitting: *Deleted

## 2019-04-05 LAB — NOVEL CORONAVIRUS, NAA: SARS-CoV-2, NAA: NOT DETECTED

## 2019-04-05 NOTE — Telephone Encounter (Signed)
Pt notified of covid results and has no further questions

## 2019-05-11 ENCOUNTER — Other Ambulatory Visit: Payer: Self-pay

## 2019-05-11 ENCOUNTER — Encounter: Payer: Self-pay | Admitting: Emergency Medicine

## 2019-05-11 ENCOUNTER — Emergency Department
Admission: EM | Admit: 2019-05-11 | Discharge: 2019-05-12 | Disposition: A | Discharge status: Home / Self Care | Payer: Medicare Other | Attending: Student in an Organized Health Care Education/Training Program | Admitting: Student in an Organized Health Care Education/Training Program

## 2019-05-11 DIAGNOSIS — R519 Headache, unspecified: Secondary | ICD-10-CM

## 2019-05-11 DIAGNOSIS — J449 Chronic obstructive pulmonary disease, unspecified: Secondary | ICD-10-CM | POA: Insufficient documentation

## 2019-05-11 DIAGNOSIS — Z79899 Other long term (current) drug therapy: Secondary | ICD-10-CM | POA: Diagnosis not present

## 2019-05-11 DIAGNOSIS — Z20828 Contact with and (suspected) exposure to other viral communicable diseases: Secondary | ICD-10-CM | POA: Diagnosis not present

## 2019-05-11 DIAGNOSIS — I110 Hypertensive heart disease with heart failure: Secondary | ICD-10-CM | POA: Diagnosis not present

## 2019-05-11 DIAGNOSIS — R197 Diarrhea, unspecified: Secondary | ICD-10-CM | POA: Diagnosis not present

## 2019-05-11 DIAGNOSIS — R42 Dizziness and giddiness: Secondary | ICD-10-CM | POA: Insufficient documentation

## 2019-05-11 DIAGNOSIS — R6883 Chills (without fever): Secondary | ICD-10-CM | POA: Insufficient documentation

## 2019-05-11 DIAGNOSIS — I509 Heart failure, unspecified: Secondary | ICD-10-CM | POA: Insufficient documentation

## 2019-05-11 DIAGNOSIS — E119 Type 2 diabetes mellitus without complications: Secondary | ICD-10-CM | POA: Diagnosis not present

## 2019-05-11 LAB — COMPREHENSIVE METABOLIC PANEL
ALT: 16 U/L (ref 0–44)
AST: 19 U/L (ref 15–41)
Albumin: 3.7 g/dL (ref 3.5–5.0)
Alkaline Phosphatase: 84 U/L (ref 38–126)
Anion gap: 8 (ref 5–15)
BUN: 7 mg/dL — ABNORMAL LOW (ref 8–23)
CO2: 25 mmol/L (ref 22–32)
Calcium: 9.4 mg/dL (ref 8.9–10.3)
Chloride: 105 mmol/L (ref 98–111)
Creatinine, Ser: 0.68 mg/dL (ref 0.44–1.00)
GFR calc Af Amer: >60 mL/min (ref >60)
GFR calc non Af Amer: >60 mL/min (ref >60)
Glucose, Bld: 98 mg/dL (ref 70–99)
Potassium: 3.3 mmol/L — ABNORMAL LOW (ref 3.5–5.1)
Sodium: 138 mmol/L (ref 135–145)
Total Bilirubin: 1 mg/dL (ref 0.3–1.2)
Total Protein: 7.5 g/dL (ref 6.5–8.1)

## 2019-05-11 LAB — URINALYSIS, COMPLETE (UACMP) WITH MICROSCOPIC
Bacteria, UA: NONE SEEN
Bilirubin Urine: NEGATIVE
Glucose, UA: NEGATIVE mg/dL
Ketones, ur: NEGATIVE mg/dL
Leukocytes,Ua: NEGATIVE
Nitrite: NEGATIVE
Protein, ur: NEGATIVE mg/dL
Specific Gravity, Urine: 1.015 (ref 1.005–1.030)
pH: 5 (ref 5.0–8.0)

## 2019-05-11 LAB — CBC
HCT: 42.8 % (ref 36.0–46.0)
Hemoglobin: 13.9 g/dL (ref 12.0–15.0)
MCH: 28 pg (ref 26.0–34.0)
MCHC: 32.5 g/dL (ref 30.0–36.0)
MCV: 86.1 fL (ref 80.0–100.0)
Platelets: 227 10*3/uL (ref 150–400)
RBC: 4.97 MIL/uL (ref 3.87–5.11)
RDW: 13.9 % (ref 11.5–15.5)
WBC: 5.6 10*3/uL (ref 4.0–10.5)
nRBC: 0 % (ref 0.0–0.2)

## 2019-05-11 LAB — LIPASE, BLOOD: Lipase: 34 U/L (ref 11–51)

## 2019-05-11 MED ORDER — SODIUM CHLORIDE 0.9 % IV BOLUS
500.0000 mL | Freq: Once | INTRAVENOUS | Status: AC
  Administered 2019-05-11: 500 mL via INTRAVENOUS

## 2019-05-11 MED ORDER — ACETAMINOPHEN 500 MG PO TABS
1000.0000 mg | ORAL_TABLET | Freq: Once | ORAL | Status: AC
  Administered 2019-05-11: 1000 mg via ORAL
  Filled 2019-05-11: qty 2

## 2019-05-11 NOTE — ED Triage Notes (Signed)
Pt says she's been in the bed off and on all day due to headache; pt says sitting up now in wheelchair she doesn't have any pain; pt says she can't describe the pain other than she knows "it ain't right"; denies visual changes; reports chills today also and some lightheadedness; pt says she fell the end of August and was diagnosed with a concussion; has been having the symptoms intermittently since; pt adds diarrhea today; denies urinary s/s; pt awake and alert; talking in complete coherent sentences;

## 2019-05-11 NOTE — ED Notes (Signed)
Pt said no pain, then said aching pain to her temples

## 2019-05-11 NOTE — ED Notes (Signed)
Peripheral IV discontinued. Catheter intact. No signs of infiltration or redness. Gauze applied to IV site.    Discharge instructions reviewed with patient. Questions fielded by this RN. Patient verbalizes understanding of instructions. Patient discharged home in stable condition per robinson. No acute distress noted at time of discharge.   Pt wheeled to ED front

## 2019-05-11 NOTE — Discharge Instructions (Addendum)

## 2019-05-11 NOTE — ED Notes (Signed)
Pt given crackers and soda per EDP.  

## 2019-05-11 NOTE — ED Provider Notes (Signed)
[facility] Emergency Department Provider Note    First MD Initiated Contact with Patient 05/11/19 2022     (approximate)  I have reviewed the triage vital signs and the nursing notes.   HISTORY  Chief Complaint Headache, Chills, Dizziness, and Diarrhea    HPI Penny Hart is a 73 y.o. female   close past medical history presents the ER for evaluation of recurrent headache as well as some nausea and will few episodes of diarrhea today.  States her stools were nonbloody and nonmelanotic.  States she does have a history of postconcussive headaches.  States feels similar to that.  No measured fevers.  Denies any chest pain or shortness of breath.  No vomiting.   Past Medical History:  Diagnosis Date  . Arthritis   . CHF (congestive heart failure) ([place])   . COPD (chronic obstructive pulmonary disease) ([place])   . Diabetes mellitus without complication ([place])   . GERD (gastroesophageal reflux disease)   . Glaucoma   . Hiatal hernia   . Hyperlipidemia   . Hypertension   . Neuropathy   . Sleep apnea   . Spondylolisthesis of lumbar region    Family History  Problem Relation Age of Onset  . Diabetes Mother    Past Surgical History:  Procedure Laterality Date  . ABDOMINAL HYSTERECTOMY    . BREAST EXCISIONAL BIOPSY Left yrs ago    benign  . COLONOSCOPY WITH PROPOFOL N/A 05/30/2015   Procedure: COLONOSCOPY WITH PROPOFOL;  Surgeon: Lucilla Lame, MD;  Location: [facility] ENDOSCOPY;  Service: Endoscopy;  Laterality: N/A;  . ESOPHAGOGASTRODUODENOSCOPY (EGD) WITH PROPOFOL N/A 05/30/2015   Procedure: ESOPHAGOGASTRODUODENOSCOPY (EGD) WITH PROPOFOL;  Surgeon: Lucilla Lame, MD;  Location: [facility] ENDOSCOPY;  Service: Endoscopy;  Laterality: N/A;  . EYE SURGERY     Bialteral for glaucoma  . Eye surgery for glaucoma    . JOINT REPLACEMENT     Left  . Left total knee replacement    . OOPHORECTOMY    . POSTERIOR LUMBAR FUSION  04/27/2017  . TUBAL LIGATION     Patient  Active Problem List   Diagnosis Date Noted  . Surgery, elective   . Chronic diastolic congestive heart failure ([place])   . Chronic obstructive pulmonary disease ([place])   . Diabetes mellitus type 2 in nonobese ([facility])   . Benign essential HTN   . Post-operative pain   . Hyponatremia   . Leukocytosis   . Spondylolisthesis of lumbar region 04/27/2017  . Blood in stool   . Hiatal hernia   . Benign neoplasm of cecum   . Benign neoplasm of transverse colon   . Diverticulosis of large intestine without diverticulitis   . Rectal bleeding 05/29/2015  . Non-intractable cyclical vomiting with nausea       Prior to Admission medications   Medication Sig Start Date End Date Taking? Authorizing Provider  amLODipine (NORVASC) 5 MG tablet Take 1 tablet by mouth daily.    [provider]  cholecalciferol (VITAMIN D) 1000 units tablet Take 1,000 Units by mouth daily.    [provider]  docusate sodium (COLACE) 100 MG capsule Take 1 capsule (100 mg total) by mouth 2 (two) times daily. 05/02/17   Newman Pies, MD  furosemide (LASIX) 20 MG tablet Take 1 tablet by mouth daily.    [provider]  gabapentin (NEURONTIN) 100 MG capsule Take 200 mg by mouth 3 (three) times daily.     [provider]  hydrocortisone (ANUSOL-HC)  25 MG suppository Place 25 mg rectally 2 (two) times daily as needed for hemorrhoids or itching.    [provider]  HYDROmorphone (DILAUDID) 4 MG tablet Take 1 tablet (4 mg total) by mouth every 4 (four) hours as needed for severe pain. Patient not taking: Reported on 08/13/2018 05/10/17   Toni Arthurs, NP  metoprolol tartrate (LOPRESSOR) 25 MG tablet Take 12.5 mg by mouth 2 (two) times daily.     [provider]  omeprazole (PRILOSEC) 20 MG capsule Take 20 mg by mouth daily as needed.    [provider]  potassium chloride SA (K-DUR,KLOR-CON) 20 MEQ tablet Take 1 tablet by mouth daily.    [provider]   pravastatin (PRAVACHOL) 10 MG tablet Take 1 tablet by mouth every evening.    [provider]  venlafaxine XR (EFFEXOR-XR) 75 MG 24 hr capsule Take 75 mg by mouth daily with breakfast.    [provider]    Allergies Oxycodone-acetaminophen and Tramadol    Social History Social History   Tobacco Use  . Smoking status: Former Research scientist (life sciences)  . Smokeless tobacco: Never Used  Substance Use Topics  . Alcohol use: No  . Drug use: No    Review of Systems Patient denies headaches, rhinorrhea, blurry vision, numbness, shortness of breath, chest pain, edema, cough, abdominal pain, nausea, vomiting, diarrhea, dysuria, fevers, rashes or hallucinations unless otherwise stated above in HPI. ____________________________________________   PHYSICAL EXAM:  VITAL SIGNS: Vitals:   05/11/19 2130 05/11/19 2200  BP: (!) 123/57 114/65  Pulse: (!) 48 (!) 54  Resp: 15 12  Temp:    SpO2: 100% 96%    Constitutional: Alert and oriented.  Eyes: Conjunctivae are normal.  Head: Atraumatic. Nose: No congestion/rhinnorhea. Mouth/Throat: Mucous membranes are moist.   Neck: No stridor. Painless ROM.  Cardiovascular: Normal rate, regular rhythm. Grossly normal heart sounds.  Good peripheral circulation. Respiratory: Normal respiratory effort.  No retractions. Lungs CTAB. Gastrointestinal: Soft and nontender. No distention. No abdominal bruits. No CVA tenderness. Genitourinary:  Musculoskeletal: No lower extremity tenderness nor edema.  No joint effusions. Neurologic:  Normal speech and language. No gross focal neurologic deficits are appreciated. No facial droop Skin:  Skin is warm, dry and intact. No rash noted. Psychiatric: Mood and affect are normal. Speech and behavior are normal.  ____________________________________________   LABS (all labs ordered are listed, but only abnormal results are displayed)  Results for orders placed or performed during the hospital encounter of  05/11/19 (from the past 24 hour(s))  Lipase, blood     Status: None   Collection Time: 05/11/19  8:07 PM  Result Value Ref Range   Lipase 34 11 - 51 U/L  Comprehensive metabolic panel     Status: Abnormal   Collection Time: 05/11/19  8:07 PM  Result Value Ref Range   Sodium 138 135 - 145 mmol/L   Potassium 3.3 (L) 3.5 - 5.1 mmol/L   Chloride 105 98 - 111 mmol/L   CO2 25 22 - 32 mmol/L   Glucose, Bld 98 70 - 99 mg/dL   BUN 7 (L) 8 - 23 mg/dL   Creatinine, Ser 0.68 0.44 - 1.00 mg/dL   Calcium 9.4 8.9 - 10.3 mg/dL   Total Protein 7.5 6.5 - 8.1 g/dL   Albumin 3.7 3.5 - 5.0 g/dL   AST 19 15 - 41 U/L   ALT 16 0 - 44 U/L   Alkaline Phosphatase 84 38 - 126 U/L   Total Bilirubin  1.0 0.3 - 1.2 mg/dL   GFR calc non Af Amer >60 >60 mL/min   GFR calc Af Amer >60 >60 mL/min   Anion gap 8 5 - 15  CBC     Status: None   Collection Time: 05/11/19  8:07 PM  Result Value Ref Range   WBC 5.6 4.0 - 10.5 K/uL   RBC 4.97 3.87 - 5.11 MIL/uL   Hemoglobin 13.9 12.0 - 15.0 g/dL   HCT 42.8 36.0 - 46.0 %   MCV 86.1 80.0 - 100.0 fL   MCH 28.0 26.0 - 34.0 pg   MCHC 32.5 30.0 - 36.0 g/dL   RDW 13.9 11.5 - 15.5 %   Platelets 227 150 - 400 K/uL   nRBC 0.0 0.0 - 0.2 %  Urinalysis, Complete w Microscopic     Status: Abnormal   Collection Time: 05/11/19  8:07 PM  Result Value Ref Range   Color, Urine YELLOW (A) YELLOW   APPearance HAZY (A) CLEAR   Specific Gravity, Urine 1.015 1.005 - 1.030   pH 5.0 5.0 - 8.0   Glucose, UA NEGATIVE NEGATIVE mg/dL   Hgb urine dipstick SMALL (A) NEGATIVE   Bilirubin Urine NEGATIVE NEGATIVE   Ketones, ur NEGATIVE NEGATIVE mg/dL   Protein, ur NEGATIVE NEGATIVE mg/dL   Nitrite NEGATIVE NEGATIVE   Leukocytes,Ua NEGATIVE NEGATIVE   RBC / HPF 0-5 0 - 5 RBC/hpf   WBC, UA 0-5 0 - 5 WBC/hpf   Bacteria, UA NONE SEEN NONE SEEN   Squamous Epithelial / LPF 0-5 0 - 5   Mucus PRESENT    Hyaline Casts, UA PRESENT    ____________________________________________  ____________________________________________ ____________________________________________   PROCEDURES  Procedure(s) performed:  Procedures    Critical Care performed: no ____________________________________________   INITIAL IMPRESSION / ASSESSMENT AND PLAN / ED COURSE  Pertinent labs & imaging results that were available during my care of the patient were reviewed by me and considered in my medical decision making (see chart for details).   DDX:'s, dehydration, diverticulitis, colitis, enteritis, tension headache, migraine, postconcussive syndrome  Penny Hart is a 73 y.o. who presents to the ED with symptoms as described above.  She is afebrile hemodynamically stable and well-appearing.  Her abdominal exam is soft and benign.  Blood work is reassuring.  Have a lower suspicion for infectious process.  Not having any any multiple episodes of diarrhea.  Patient able we will send stool for studies.  Clinical Course as of May 10 2237  Sat May 11, 2019  2237 Patient reassessed.  Feels improved.  Tolerating oral hydration.  No additional episodes of diarrhea.  Do believe she stable and appropriate for outpatient follow-up.   [PR]    Clinical Course User Index [PR] Merlyn Lot, MD    The patient was evaluated in Emergency Department today for the symptoms described in the history of present illness. He/she was evaluated in the context of the global COVID-19 pandemic, which necessitated consideration that the patient might be at risk for infection with the SARS-CoV-2 virus that causes COVID-19. Institutional protocols and algorithms that pertain to the evaluation of patients at risk for COVID-19 are in a state of rapid change based on information released by regulatory bodies including the CDC and federal and state organizations. These policies and algorithms were followed during the patient's care in the ED.  As part of my medical decision making, I reviewed the following data  within the [place] notes reviewed and incorporated,  Labs reviewed, notes from prior ED visits and [place] Controlled Substance Database   ____________________________________________   FINAL CLINICAL IMPRESSION(S) / ED DIAGNOSES  Final diagnoses:  Nonintractable headache, unspecified chronicity pattern, unspecified headache type      NEW MEDICATIONS STARTED DURING THIS VISIT:  New Prescriptions   No medications on file     Note:  This document was prepared using Dragon voice recognition software and may include unintentional dictation errors.    Merlyn Lot, MD 05/11/19 2238

## 2019-05-12 LAB — SARS CORONAVIRUS 2 (TAT 6-24 HRS): SARS Coronavirus 2: NEGATIVE

## 2019-08-17 ENCOUNTER — Encounter: Payer: Self-pay | Admitting: Emergency Medicine

## 2019-08-17 ENCOUNTER — Emergency Department
Admission: EM | Admit: 2019-08-17 | Discharge: 2019-08-17 | Disposition: A | Discharge status: Home / Self Care | Payer: Medicare Other | Attending: Emergency Medicine | Admitting: Emergency Medicine

## 2019-08-17 ENCOUNTER — Emergency Department: Payer: Medicare Other

## 2019-08-17 ENCOUNTER — Other Ambulatory Visit: Payer: Self-pay

## 2019-08-17 DIAGNOSIS — R4182 Altered mental status, unspecified: Secondary | ICD-10-CM | POA: Diagnosis not present

## 2019-08-17 DIAGNOSIS — Z96652 Presence of left artificial knee joint: Secondary | ICD-10-CM | POA: Insufficient documentation

## 2019-08-17 DIAGNOSIS — R509 Fever, unspecified: Secondary | ICD-10-CM | POA: Diagnosis not present

## 2019-08-17 DIAGNOSIS — U071 COVID-19: Secondary | ICD-10-CM | POA: Diagnosis not present

## 2019-08-17 DIAGNOSIS — E119 Type 2 diabetes mellitus without complications: Secondary | ICD-10-CM | POA: Insufficient documentation

## 2019-08-17 DIAGNOSIS — I5032 Chronic diastolic (congestive) heart failure: Secondary | ICD-10-CM | POA: Insufficient documentation

## 2019-08-17 DIAGNOSIS — Z79899 Other long term (current) drug therapy: Secondary | ICD-10-CM | POA: Diagnosis not present

## 2019-08-17 DIAGNOSIS — J449 Chronic obstructive pulmonary disease, unspecified: Secondary | ICD-10-CM | POA: Insufficient documentation

## 2019-08-17 DIAGNOSIS — Z9181 History of falling: Secondary | ICD-10-CM | POA: Insufficient documentation

## 2019-08-17 DIAGNOSIS — Z87891 Personal history of nicotine dependence: Secondary | ICD-10-CM | POA: Insufficient documentation

## 2019-08-17 DIAGNOSIS — R05 Cough: Secondary | ICD-10-CM | POA: Diagnosis present

## 2019-08-17 DIAGNOSIS — I110 Hypertensive heart disease with heart failure: Secondary | ICD-10-CM | POA: Insufficient documentation

## 2019-08-17 LAB — URINALYSIS, COMPLETE (UACMP) WITH MICROSCOPIC
Bacteria, UA: NONE SEEN
Bilirubin Urine: NEGATIVE
Glucose, UA: NEGATIVE mg/dL
Ketones, ur: NEGATIVE mg/dL
Leukocytes,Ua: NEGATIVE
Nitrite: NEGATIVE
Protein, ur: NEGATIVE mg/dL
Specific Gravity, Urine: 1.003 — ABNORMAL LOW (ref 1.005–1.030)
pH: 6 (ref 5.0–8.0)

## 2019-08-17 LAB — COMPREHENSIVE METABOLIC PANEL
ALT: 13 U/L (ref 0–44)
AST: 16 U/L (ref 15–41)
Albumin: 3.6 g/dL (ref 3.5–5.0)
Alkaline Phosphatase: 82 U/L (ref 38–126)
Anion gap: 10 (ref 5–15)
BUN: <5 mg/dL — ABNORMAL LOW (ref 8–23)
CO2: 26 mmol/L (ref 22–32)
Calcium: 8.9 mg/dL (ref 8.9–10.3)
Chloride: 101 mmol/L (ref 98–111)
Creatinine, Ser: 0.82 mg/dL (ref 0.44–1.00)
GFR calc Af Amer: >60 mL/min (ref >60)
GFR calc non Af Amer: >60 mL/min (ref >60)
Glucose, Bld: 94 mg/dL (ref 70–99)
Potassium: 3.2 mmol/L — ABNORMAL LOW (ref 3.5–5.1)
Sodium: 137 mmol/L (ref 135–145)
Total Bilirubin: 1 mg/dL (ref 0.3–1.2)
Total Protein: 7.6 g/dL (ref 6.5–8.1)

## 2019-08-17 LAB — RESPIRATORY PANEL BY RT PCR (FLU A&B, COVID)
Influenza A by PCR: NEGATIVE
Influenza B by PCR: NEGATIVE
SARS Coronavirus 2 by RT PCR: POSITIVE — AB

## 2019-08-17 LAB — CBC WITH DIFFERENTIAL/PLATELET
Abs Immature Granulocytes: 0.01 10*3/uL (ref 0.00–0.07)
Basophils Absolute: 0 10*3/uL (ref 0.0–0.1)
Basophils Relative: 0 %
Eosinophils Absolute: 0 10*3/uL (ref 0.0–0.5)
Eosinophils Relative: 0 %
HCT: 42.5 % (ref 36.0–46.0)
Hemoglobin: 13.5 g/dL (ref 12.0–15.0)
Immature Granulocytes: 0 %
Lymphocytes Relative: 14 %
Lymphs Abs: 0.7 10*3/uL (ref 0.7–4.0)
MCH: 28.2 pg (ref 26.0–34.0)
MCHC: 31.8 g/dL (ref 30.0–36.0)
MCV: 88.7 fL (ref 80.0–100.0)
Monocytes Absolute: 1.2 10*3/uL — ABNORMAL HIGH (ref 0.1–1.0)
Monocytes Relative: 22 %
Neutro Abs: 3.3 10*3/uL (ref 1.7–7.7)
Neutrophils Relative %: 64 %
Platelets: 239 10*3/uL (ref 150–400)
RBC: 4.79 MIL/uL (ref 3.87–5.11)
RDW: 14.5 % (ref 11.5–15.5)
WBC: 5.2 10*3/uL (ref 4.0–10.5)
nRBC: 0 % (ref 0.0–0.2)

## 2019-08-17 LAB — POC SARS CORONAVIRUS 2 AG: SARS Coronavirus 2 Ag: NEGATIVE

## 2019-08-17 MED ORDER — ACETAMINOPHEN 325 MG PO TABS
ORAL_TABLET | ORAL | Status: AC
  Filled 2019-08-17: qty 2

## 2019-08-17 MED ORDER — ACETAMINOPHEN 325 MG PO TABS
650.0000 mg | ORAL_TABLET | Freq: Once | ORAL | Status: AC
  Administered 2019-08-17: 650 mg via ORAL

## 2019-08-17 MED ORDER — SODIUM CHLORIDE 0.9 % IV BOLUS
1000.0000 mL | Freq: Once | INTRAVENOUS | Status: AC
  Administered 2019-08-17: 1000 mL via INTRAVENOUS

## 2019-08-17 MED ORDER — IOHEXOL 300 MG/ML  SOLN
100.0000 mL | Freq: Once | INTRAMUSCULAR | Status: AC | PRN
  Administered 2019-08-17: 100 mL via INTRAVENOUS

## 2019-08-17 MED ORDER — ACETAMINOPHEN 325 MG PO TABS
650.0000 mg | ORAL_TABLET | Freq: Once | ORAL | Status: AC
  Administered 2019-08-17: 20:00:00 650 mg via ORAL
  Filled 2019-08-17: qty 2

## 2019-08-17 MED ORDER — ONDANSETRON 4 MG PO TBDP: 4.0000 mg | ORAL_TABLET | Freq: Three times a day (TID) | ORAL | 0 refills | Status: DC | PRN

## 2019-08-17 MED ORDER — ACETAMINOPHEN 325 MG PO TABS: 650.0000 mg | ORAL_TABLET | Freq: Four times a day (QID) | ORAL | 0 refills | Status: DC | PRN

## 2019-08-17 NOTE — ED Notes (Signed)
PT ambulated to toilet w/o difficulty or need for assistance.

## 2019-08-17 NOTE — ED Notes (Signed)
Family contacts:  Nicole Kindred: [phone]  Shy: [phone]

## 2019-08-17 NOTE — ED Triage Notes (Signed)
Pt is poor historian. C/o dizziness, diarrhea, headache, sore throat, cough, back aches, and fever.  Unlabored. VSS at this time.  No loss taste or smell.  Pt mostly seems to c/o sore throat and headache.  Unsure of allergies.  Pt also fell trying to step over a suit case and c/o pain to both hips; reports has had it xrayed.  Reports her daughter in law gave her something for her headache but she does not know what.

## 2019-08-17 NOTE — Discharge Instructions (Signed)
Your lab tests and CT scan of the abdomen were all okay today.  Your Covid test was positive which is causing your symptoms.  Follow a bland diet, and focus on fluid intake to stay hydrated.  Return to the emergency room if you have high fever, severe pain, or worsening shortness of breath.

## 2019-08-17 NOTE — ED Notes (Addendum)
Son Nicole Kindred called for update, informed him patient waiting in lobby at this time and that she is stable at this time. Informed him that we are waiting for labs and room to see provider.  Family unable to verify allergies at this time.

## 2019-08-17 NOTE — ED Notes (Signed)
Pt c/o headache and asking for place to lay down, informed her that I don't have any where for her to lay down at this time, reported to patient that there are 2 people ahead of her.   Family calling upset and demanding patient be in reclining chair to lay down. Informed family that there is no place for her to lay on at this time, family demanding to speak with supervisor, advised if they wanted to call back to the ED and talk to the charge nurse they can.

## 2019-08-17 NOTE — ED Triage Notes (Signed)
No tylenol or motrin given at this time per dr Jari Pigg.  Pt was given meds prior to coming but unsure what.  Pt is unsure of allergies or what her reaction to percocet is.

## 2019-08-17 NOTE — ED Triage Notes (Signed)
FIRST NURSE NOTE:  Pt arrived via EMS from home with reports of cough, weak, dizzy, sxs began last night  EMS VS: Temp 100.9, 103/65, p-80s, 95% RA  H/o HTN, DMT2  Per EMS lungs clear

## 2019-08-17 NOTE — ED Notes (Signed)
Discussed with Dr. Jimmye Norman, since previous when asked about allergies pt and family were not sure and pt has listed allergy to Percocet.  Also per EMS, it was reported patient was given something for HA but was unsure of what it was. Since pt has been here 4 hours, if she was given Tylenol enough time has passed that she can have an additional dose.  650mg  of Tylenol given to patient at this time along with cup of water, advised patient again that she cannot have a warm blanket due to her fever.

## 2019-08-17 NOTE — ED Provider Notes (Signed)
[facility] Emergency Department Provider Note  ____________________________________________  Time seen: Approximately 10:14 PM  I have reviewed the triage vital signs and the nursing notes.   HISTORY  Chief Complaint Fall and Headache    Level 5 Caveat: Portions of the History and Physical including HPI and review of systems are unable to be completely obtained due to patient being a poor historian   HPI Penny Hart is a 74 y.o. female with a history of CHF COPD diabetes hypertension  who comes the ED complaining of nonproductive cough, weakness, malaise, loss of appetite since yesterday.  She also states that she has not felt well since she had a fall in November.     Past Medical History:  Diagnosis Date  . Arthritis   . CHF (congestive heart failure) ([place])   . COPD (chronic obstructive pulmonary disease) ([place])   . Diabetes mellitus without complication ([place])   . GERD (gastroesophageal reflux disease)   . Glaucoma   . Hiatal hernia   . Hyperlipidemia   . Hypertension   . Neuropathy   . Sleep apnea   . Spondylolisthesis of lumbar region      Patient Active Problem List   Diagnosis Date Noted  . Surgery, elective   . Chronic diastolic congestive heart failure ([place])   . Chronic obstructive pulmonary disease ([place])   . Diabetes mellitus type 2 in nonobese ([facility])   . Benign essential HTN   . Post-operative pain   . Hyponatremia   . Leukocytosis   . Spondylolisthesis of lumbar region 04/27/2017  . Blood in stool   . Hiatal hernia   . Benign neoplasm of cecum   . Benign neoplasm of transverse colon   . Diverticulosis of large intestine without diverticulitis   . Rectal bleeding 05/29/2015  . Non-intractable cyclical vomiting with nausea      Past Surgical History:  Procedure Laterality Date  . ABDOMINAL HYSTERECTOMY    . BREAST EXCISIONAL BIOPSY Left yrs ago    benign  . COLONOSCOPY WITH PROPOFOL N/A 05/30/2015   Procedure:  COLONOSCOPY WITH PROPOFOL;  Surgeon: Lucilla Lame, MD;  Location: [facility] ENDOSCOPY;  Service: Endoscopy;  Laterality: N/A;  . ESOPHAGOGASTRODUODENOSCOPY (EGD) WITH PROPOFOL N/A 05/30/2015   Procedure: ESOPHAGOGASTRODUODENOSCOPY (EGD) WITH PROPOFOL;  Surgeon: Lucilla Lame, MD;  Location: [facility] ENDOSCOPY;  Service: Endoscopy;  Laterality: N/A;  . EYE SURGERY     Bialteral for glaucoma  . Eye surgery for glaucoma    . JOINT REPLACEMENT     Left  . Left total knee replacement    . OOPHORECTOMY    . POSTERIOR LUMBAR FUSION  04/27/2017  . TUBAL LIGATION       Prior to Admission medications   Medication Sig Start Date End Date Taking? Authorizing Provider  acetaminophen (TYLENOL) 325 MG tablet Take 2 tablets (650 mg total) by mouth every 6 (six) hours as needed. 08/17/19   Carrie Mew, MD  amLODipine (NORVASC) 5 MG tablet Take 1 tablet by mouth daily.    [provider]  cholecalciferol (VITAMIN D) 1000 units tablet Take 1,000 Units by mouth daily.    [provider]  docusate sodium (COLACE) 100 MG capsule Take 1 capsule (100 mg total) by mouth 2 (two) times daily. 05/02/17   Newman Pies, MD  furosemide (LASIX) 20 MG tablet Take 1 tablet by mouth daily.    [provider]  gabapentin (NEURONTIN) 100 MG capsule Take 200 mg by mouth 3 (three) times daily.  [provider]  hydrocortisone (ANUSOL-HC) 25 MG suppository Place 25 mg rectally 2 (two) times daily as needed for hemorrhoids or itching.    [provider]  HYDROmorphone (DILAUDID) 4 MG tablet Take 1 tablet (4 mg total) by mouth every 4 (four) hours as needed for severe pain. Patient not taking: Reported on 08/13/2018 05/10/17   Toni Arthurs, NP  metoprolol tartrate (LOPRESSOR) 25 MG tablet Take 12.5 mg by mouth 2 (two) times daily.     [provider]  omeprazole (PRILOSEC) 20 MG capsule Take 20 mg by mouth daily as needed.    [provider]  ondansetron (ZOFRAN ODT) 4  MG disintegrating tablet Take 1 tablet (4 mg total) by mouth every 8 (eight) hours as needed for nausea or vomiting. 08/17/19   Carrie Mew, MD  potassium chloride SA (K-DUR,KLOR-CON) 20 MEQ tablet Take 1 tablet by mouth daily.    [provider]  pravastatin (PRAVACHOL) 10 MG tablet Take 1 tablet by mouth every evening.    [provider]  venlafaxine XR (EFFEXOR-XR) 75 MG 24 hr capsule Take 75 mg by mouth daily with breakfast.    [provider]     Allergies Oxycodone-acetaminophen and Tramadol   Family History  Problem Relation Age of Onset  . Diabetes Mother     Social History Social History   Tobacco Use  . Smoking status: Former Research scientist (life sciences)  . Smokeless tobacco: Never Used  Substance Use Topics  . Alcohol use: No  . Drug use: No    Review of Systems Level 5 Caveat: Portions of the History and Physical including HPI and review of systems are unable to be completely obtained due to patient being a poor historian   Constitutional:   Positive fever.  ENT:   No rhinorrhea. Cardiovascular:   No chest pain or syncope. Respiratory:   No dyspnea positive cough. Gastrointestinal:   Negative for abdominal pain, vomiting and diarrhea.  Musculoskeletal:   Negative for focal pain or swelling ____________________________________________   PHYSICAL EXAM:  VITAL SIGNS: ED Triage Vitals  Enc Vitals Group     BP 08/17/19 1337 135/72     Pulse Rate 08/17/19 1330 73     Resp 08/17/19 1330 20     Temp 08/17/19 1330 (!) 101.7 F (38.7 C)     Temp Source 08/17/19 1330 Oral     SpO2 08/17/19 1330 93 %     Weight 08/17/19 1330 180 lb (81.6 kg)     Height 08/17/19 1330 5\' 5"  (1.651 m)     Head Circumference --      Peak Flow --      Pain Score 08/17/19 1330 6     Pain Loc --      Pain Edu? --      Excl. in [place]? --     Vital signs reviewed, nursing assessments reviewed.   Constitutional:   Alert and oriented. Non-toxic appearance. Eyes:    Conjunctivae are normal. EOMI. PERRL. ENT      Head:   Normocephalic and atraumatic.      Nose:   No congestion/rhinnorhea.       Mouth/Throat:   MMM, no pharyngeal erythema. No peritonsillar mass.       Neck:   No meningismus. Full ROM. Hematological/Lymphatic/Immunilogical:   No cervical lymphadenopathy. Cardiovascular:   RRR. Symmetric bilateral radial and DP pulses.  No murmurs. Cap refill less than 2 seconds. Respiratory:   Normal respiratory effort without tachypnea/retractions. Breath sounds  are clear and equal bilaterally. No wheezes/rales/rhonchi. Gastrointestinal:   Soft with mild generalized tenderness, nonfocal. Non distended. There is no CVA tenderness.  No rebound, rigidity, or guarding.  Musculoskeletal:   Normal range of motion in all extremities. No joint effusions.  No lower extremity tenderness.  No edema. Neurologic:   Normal speech and language.  Motor grossly intact. No acute focal neurologic deficits are appreciated.  Skin:    Skin is warm, dry and intact. No rash noted.  No petechiae, purpura, or bullae.  ____________________________________________    LABS (pertinent positives/negatives) (all labs ordered are listed, but only abnormal results are displayed) Labs Reviewed  RESPIRATORY PANEL BY RT PCR (FLU A&B, COVID) - Abnormal; Notable for the following components:      Result Value   SARS Coronavirus 2 by RT PCR POSITIVE (*)    All other components within normal limits  URINALYSIS, COMPLETE (UACMP) WITH MICROSCOPIC - Abnormal; Notable for the following components:   Color, Urine STRAW (*)    APPearance CLEAR (*)    Specific Gravity, Urine 1.003 (*)    Hgb urine dipstick SMALL (*)    All other components within normal limits  CBC WITH DIFFERENTIAL/PLATELET - Abnormal; Notable for the following components:   Monocytes Absolute 1.2 (*)    All other components within normal limits  COMPREHENSIVE METABOLIC PANEL - Abnormal; Notable for the following  components:   Potassium 3.2 (*)    BUN <5 (*)    All other components within normal limits  URINE CULTURE  POC SARS CORONAVIRUS 2 AG -  ED  POC SARS CORONAVIRUS 2 AG   ____________________________________________   EKG  Interpreted by me Normal sinus rhythm rate of 68, normal axis and intervals.  Normal QRS ST segments and T waves.  No acute ischemic changes.  ____________________________________________    RADIOLOGY  DG Chest 2 View  Result Date: 08/17/2019 CLINICAL DATA:  74 year old female with cough EXAM: CHEST - 2 VIEW COMPARISON:  09/04/2017 FINDINGS: Cardiomediastinal silhouette unchanged in size and contour. No pneumothorax or pleural effusion. No confluent airspace disease. Coarsened interstitial markings similar to the prior. No displaced fracture with degenerative changes of the spine IMPRESSION: Negative for acute cardiopulmonary disease Electronically Signed   By: Corrie Mckusick D.O.   On: 08/17/2019 14:41   CT Head Wo Contrast  Result Date: 08/17/2019 CLINICAL DATA:  Altered mental status EXAM: CT HEAD WITHOUT CONTRAST TECHNIQUE: Contiguous axial images were obtained from the base of the skull through the vertex without intravenous contrast. COMPARISON:  MR 06/25/2018, CT head 04/05/2018 FINDINGS: Brain: No evidence of acute infarction, hemorrhage, hydrocephalus, extra-axial collection or mass lesion/mass effect. Symmetric prominence of the ventricles, cisterns and sulci compatible with parenchymal volume loss. Patchy areas of white matter hypoattenuation are most compatible with chronic microvascular angiopathy. Vascular: Atherosclerotic calcification of the carotid siphons. No hyperdense vessel. Skull: No calvarial fracture or suspicious osseous lesion. No scalp swelling or hematoma. Sinuses/Orbits: Stable radiodensities in the anterior globes bilaterally. Prior bilateral lens extractions. Orbits are otherwise unremarkable. Paranasal sinuses and mastoid air cells are  predominantly clear. Other: None IMPRESSION: 1. No acute intracranial findings. 2. Stable chronic microvascular angiopathy and parenchymal volume loss. Electronically Signed   By: Lovena Le M.D.   On: 08/17/2019 21:42   CT ABDOMEN PELVIS W CONTRAST  Result Date: 08/17/2019 CLINICAL DATA:  Abdominal pain and fever EXAM: CT ABDOMEN AND PELVIS WITH CONTRAST TECHNIQUE: Multidetector CT imaging of the abdomen and pelvis was performed using the standard  protocol following bolus administration of intravenous contrast. CONTRAST:  140mL OMNIPAQUE IOHEXOL 300 MG/ML  SOLN COMPARISON:  May 29, 2015 FINDINGS: Lower chest: There are emphysematous changes at the lung bases bilaterally.The heart size is normal. Hepatobiliary: There is questionable hepatic steatosis, not well characterized on this contrast enhanced study. Normal gallbladder.There is no biliary ductal dilation. Pancreas: Normal contours without ductal dilatation. No peripancreatic fluid collection. Spleen: No splenic laceration or hematoma. Adrenals/Urinary Tract: --Adrenal glands: No adrenal hemorrhage. --Right kidney/ureter: No hydronephrosis or perinephric hematoma. --Left kidney/ureter: No hydronephrosis or perinephric hematoma. --Urinary bladder: Unremarkable. Stomach/Bowel: --Stomach/Duodenum: No hiatal hernia or other gastric abnormality. Normal duodenal course and caliber. --Small bowel: No dilatation or inflammation. --Colon: There is scattered colonic diverticula without CT evidence for diverticulitis. --Appendix: Not visualized. No right lower quadrant inflammation or free fluid. Vascular/Lymphatic: Atherosclerotic calcification is present within the non-aneurysmal abdominal aorta, without hemodynamically significant stenosis. --No retroperitoneal lymphadenopathy. --No mesenteric lymphadenopathy. --No pelvic or inguinal lymphadenopathy. Reproductive: Unremarkable Other: No ascites or free air. The abdominal wall is normal. Musculoskeletal. There  is age-indeterminate height loss of the T10 vertebral body, favored to be chronic. The patient is status post prior L4-L5 posterior fusion with an interbody spacer at the L4-L5 level. The hardware appears grossly intact. IMPRESSION: 1. No acute abnormality detected. 2. Scattered colonic diverticula without CT evidence for diverticulitis. 3. Aortic Atherosclerosis (ICD10-I70.0) and Emphysema (ICD10-J43.9). Electronically Signed   By: Constance Holster M.D.   On: 08/17/2019 21:40    ____________________________________________   PROCEDURES Procedures  ____________________________________________  DIFFERENTIAL DIAGNOSIS   UTI, pneumonia, intracranial hemorrhage, diverticulitis, intra-abdominal abscess, COVID-19  CLINICAL IMPRESSION / ASSESSMENT AND PLAN / ED COURSE  Medications ordered in the ED: Medications  acetaminophen (TYLENOL) tablet 650 mg (650 mg Oral Given 08/17/19 1709)  sodium chloride 0.9 % bolus 1,000 mL (1,000 mLs Intravenous New Bag/Given 08/17/19 2101)  acetaminophen (TYLENOL) tablet 650 mg (650 mg Oral Given 08/17/19 2029)  iohexol (OMNIPAQUE) 300 MG/ML solution 100 mL (100 mLs Intravenous Contrast Given 08/17/19 2118)    Pertinent labs & imaging results that were available during my care of the patient were reviewed by me and considered in my medical decision making (see chart for details).   Penny Hart was evaluated in Emergency Department on 08/17/2019 for the symptoms described in the history of present illness. She was evaluated in the context of the global COVID-19 pandemic, which necessitated consideration that the patient might be at risk for infection with the SARS-CoV-2 virus that causes COVID-19. Institutional protocols and algorithms that pertain to the evaluation of patients at risk for COVID-19 are in a state of rapid change based on information released by regulatory bodies including the CDC and federal and state organizations. These policies and algorithms  were followed during the patient's care in the ED.   Patient presents with multiple constitutional symptoms suggestive of a viral illness.  Initial lab panel unremarkable, urinalysis unremarkable, chest x-ray unremarkable.  CT head and abdomen obtained which were both negative for acute pathology as well.  Coronavirus PCR came back positive providing diagnosis of COVID-19 infection which explains her symptoms of influenza-like illness.  She is ambulatory, alert, well-appearing.  Continue Tylenol as needed, Zofran as needed, focus on hydration.  Return if worsening shortness of breath or other new symptoms or concerns.      ____________________________________________   FINAL CLINICAL IMPRESSION(S) / ED DIAGNOSES    Final diagnoses:  COVID-19 virus infection     ED Discharge Orders  Ordered    ondansetron (ZOFRAN ODT) 4 MG disintegrating tablet  Every 8 hours PRN     08/17/19 2213    acetaminophen (TYLENOL) 325 MG tablet  Every 6 hours PRN     08/17/19 2213          Portions of this note were generated with dragon dictation software. Dictation errors may occur despite best attempts at proofreading.   Carrie Mew, MD 08/17/19 2218

## 2019-08-18 ENCOUNTER — Telehealth: Payer: Self-pay | Admitting: Infectious Diseases

## 2019-08-18 NOTE — Telephone Encounter (Signed)
Called to discuss with patient about Covid symptoms and the use of bamlanivimab, a monoclonal antibody infusion for those with mild to moderate Covid symptoms and at a high risk of hospitalization.  Pt is qualified for this infusion at the [facility] infusion center due to Age > 55.    Symptoms started on Friday into Saturday.   Her son Nicole Kindred is on the phone and wants to know about isolation for him. We discussed them both wearing masks in the home to limit the spread as much as they can, cleaning of surfaces, washing hands since he is there caring for his mother and around her 24/7. I advised him to get a test done between day 7-10 following his mother's first onset of symptoms so he can see if he has become infected. Suggested to reach out to HD to get return to work letter for his work should the ER documentation not be enough.   She would qualify for infusion therapy but they would like to discuss with his sister first. Infusion clinic # given for call back should they reach a decision and wish to proceed. They have otherwise been consented for the benefits, risks/side effects and time commitment for this treatment.

## 2019-08-19 LAB — URINE CULTURE

## 2019-08-24 ENCOUNTER — Emergency Department: Payer: Medicare Other

## 2019-08-24 ENCOUNTER — Emergency Department
Admission: EM | Admit: 2019-08-24 | Discharge: 2019-08-24 | Disposition: A | Discharge status: Home / Self Care | Payer: Medicare Other | Attending: Student in an Organized Health Care Education/Training Program | Admitting: Student in an Organized Health Care Education/Training Program

## 2019-08-24 ENCOUNTER — Other Ambulatory Visit: Payer: Self-pay

## 2019-08-24 DIAGNOSIS — Z96652 Presence of left artificial knee joint: Secondary | ICD-10-CM | POA: Diagnosis not present

## 2019-08-24 DIAGNOSIS — R0789 Other chest pain: Secondary | ICD-10-CM | POA: Diagnosis not present

## 2019-08-24 DIAGNOSIS — J449 Chronic obstructive pulmonary disease, unspecified: Secondary | ICD-10-CM | POA: Insufficient documentation

## 2019-08-24 DIAGNOSIS — Z87891 Personal history of nicotine dependence: Secondary | ICD-10-CM | POA: Insufficient documentation

## 2019-08-24 DIAGNOSIS — U071 COVID-19: Secondary | ICD-10-CM | POA: Diagnosis not present

## 2019-08-24 DIAGNOSIS — Z79899 Other long term (current) drug therapy: Secondary | ICD-10-CM | POA: Diagnosis not present

## 2019-08-24 DIAGNOSIS — I5032 Chronic diastolic (congestive) heart failure: Secondary | ICD-10-CM | POA: Insufficient documentation

## 2019-08-24 DIAGNOSIS — E1140 Type 2 diabetes mellitus with diabetic neuropathy, unspecified: Secondary | ICD-10-CM | POA: Insufficient documentation

## 2019-08-24 DIAGNOSIS — R197 Diarrhea, unspecified: Secondary | ICD-10-CM | POA: Insufficient documentation

## 2019-08-24 DIAGNOSIS — I110 Hypertensive heart disease with heart failure: Secondary | ICD-10-CM | POA: Diagnosis not present

## 2019-08-24 DIAGNOSIS — R110 Nausea: Secondary | ICD-10-CM | POA: Diagnosis not present

## 2019-08-24 LAB — FERRITIN: Ferritin: 96 ng/mL (ref 11–307)

## 2019-08-24 LAB — CBC WITH DIFFERENTIAL/PLATELET
Abs Immature Granulocytes: 0.01 10*3/uL (ref 0.00–0.07)
Basophils Absolute: 0 10*3/uL (ref 0.0–0.1)
Basophils Relative: 0 %
Eosinophils Absolute: 0 10*3/uL (ref 0.0–0.5)
Eosinophils Relative: 0 %
HCT: 42.4 % (ref 36.0–46.0)
Hemoglobin: 13.6 g/dL (ref 12.0–15.0)
Immature Granulocytes: 0 %
Lymphocytes Relative: 20 %
Lymphs Abs: 0.9 10*3/uL (ref 0.7–4.0)
MCH: 28 pg (ref 26.0–34.0)
MCHC: 32.1 g/dL (ref 30.0–36.0)
MCV: 87.2 fL (ref 80.0–100.0)
Monocytes Absolute: 0.7 10*3/uL (ref 0.1–1.0)
Monocytes Relative: 15 %
Neutro Abs: 3 10*3/uL (ref 1.7–7.7)
Neutrophils Relative %: 65 %
Platelets: 254 10*3/uL (ref 150–400)
RBC: 4.86 MIL/uL (ref 3.87–5.11)
RDW: 14.4 % (ref 11.5–15.5)
WBC: 4.6 10*3/uL (ref 4.0–10.5)
nRBC: 0 % (ref 0.0–0.2)

## 2019-08-24 LAB — FIBRINOGEN: Fibrinogen: 521 mg/dL — ABNORMAL HIGH (ref 210–475)

## 2019-08-24 LAB — TRIGLYCERIDES: Triglycerides: 92 mg/dL (ref <150)

## 2019-08-24 LAB — COMPREHENSIVE METABOLIC PANEL
ALT: 15 U/L (ref 0–44)
AST: 22 U/L (ref 15–41)
Albumin: 3.4 g/dL — ABNORMAL LOW (ref 3.5–5.0)
Alkaline Phosphatase: 63 U/L (ref 38–126)
Anion gap: 10 (ref 5–15)
BUN: 6 mg/dL — ABNORMAL LOW (ref 8–23)
CO2: 25 mmol/L (ref 22–32)
Calcium: 8.5 mg/dL — ABNORMAL LOW (ref 8.9–10.3)
Chloride: 103 mmol/L (ref 98–111)
Creatinine, Ser: 0.75 mg/dL (ref 0.44–1.00)
GFR calc Af Amer: >60 mL/min (ref >60)
GFR calc non Af Amer: >60 mL/min (ref >60)
Glucose, Bld: 107 mg/dL — ABNORMAL HIGH (ref 70–99)
Potassium: 3.4 mmol/L — ABNORMAL LOW (ref 3.5–5.1)
Sodium: 138 mmol/L (ref 135–145)
Total Bilirubin: 0.9 mg/dL (ref 0.3–1.2)
Total Protein: 7.3 g/dL (ref 6.5–8.1)

## 2019-08-24 LAB — C-REACTIVE PROTEIN: CRP: 5 mg/dL — ABNORMAL HIGH

## 2019-08-24 LAB — LACTIC ACID, PLASMA: Lactic Acid, Venous: 1.4 mmol/L (ref 0.5–1.9)

## 2019-08-24 LAB — TROPONIN I (HIGH SENSITIVITY)
Troponin I (High Sensitivity): 5 ng/L (ref <18)
Troponin I (High Sensitivity): 6 ng/L (ref <18)

## 2019-08-24 LAB — PROCALCITONIN: Procalcitonin: <0.1 ng/mL

## 2019-08-24 LAB — FIBRIN DERIVATIVES D-DIMER (ARMC ONLY): Fibrin derivatives D-dimer (ARMC): 561.48 ng{FEU}/mL — ABNORMAL HIGH (ref 0.00–499.00)

## 2019-08-24 LAB — LACTATE DEHYDROGENASE: LDH: 201 U/L — ABNORMAL HIGH (ref 98–192)

## 2019-08-24 MED ORDER — IOHEXOL 350 MG/ML SOLN
75.0000 mL | Freq: Once | INTRAVENOUS | Status: AC | PRN
  Administered 2019-08-24: 75 mL via INTRAVENOUS

## 2019-08-24 MED ORDER — SODIUM CHLORIDE 0.9 % IV SOLN
1000.0000 mL | INTRAVENOUS | Status: DC
  Administered 2019-08-24: 1000 mL via INTRAVENOUS

## 2019-08-24 NOTE — ED Provider Notes (Signed)
[facility] Emergency Department Provider Note    First MD Initiated Contact with Patient 08/24/19 0945     (approximate)  I have reviewed the triage vital signs and the nursing notes.   HISTORY  Chief Complaint Chest Pain    HPI Penny Hart is a 74 y.o. female the below listed past medical history presents the ER for evaluation of intermittent chest pain for the past several days.  Patient diagnosed with Covid 1 week ago.  Still having some nausea and diarrhea.  Denies any shortness of breath.  Does not wear home oxygen.  States the pain is sharp midsternal nonradiating.    Past Medical History:  Diagnosis Date  . Arthritis   . CHF (congestive heart failure) ([place])   . COPD (chronic obstructive pulmonary disease) ([place])   . Diabetes mellitus without complication ([place])   . GERD (gastroesophageal reflux disease)   . Glaucoma   . Hiatal hernia   . Hyperlipidemia   . Hypertension   . Neuropathy   . Sleep apnea   . Spondylolisthesis of lumbar region    Family History  Problem Relation Age of Onset  . Diabetes Mother    Past Surgical History:  Procedure Laterality Date  . ABDOMINAL HYSTERECTOMY    . BREAST EXCISIONAL BIOPSY Left yrs ago    benign  . COLONOSCOPY WITH PROPOFOL N/A 05/30/2015   Procedure: COLONOSCOPY WITH PROPOFOL;  Surgeon: Lucilla Lame, MD;  Location: [facility] ENDOSCOPY;  Service: Endoscopy;  Laterality: N/A;  . ESOPHAGOGASTRODUODENOSCOPY (EGD) WITH PROPOFOL N/A 05/30/2015   Procedure: ESOPHAGOGASTRODUODENOSCOPY (EGD) WITH PROPOFOL;  Surgeon: Lucilla Lame, MD;  Location: [facility] ENDOSCOPY;  Service: Endoscopy;  Laterality: N/A;  . EYE SURGERY     Bialteral for glaucoma  . Eye surgery for glaucoma    . JOINT REPLACEMENT     Left  . Left total knee replacement    . OOPHORECTOMY    . POSTERIOR LUMBAR FUSION  04/27/2017  . TUBAL LIGATION     Patient Active Problem List   Diagnosis Date Noted  . Surgery, elective   . Chronic  diastolic congestive heart failure ([place])   . Chronic obstructive pulmonary disease ([place])   . Diabetes mellitus type 2 in nonobese ([facility])   . Benign essential HTN   . Post-operative pain   . Hyponatremia   . Leukocytosis   . Spondylolisthesis of lumbar region 04/27/2017  . Blood in stool   . Hiatal hernia   . Benign neoplasm of cecum   . Benign neoplasm of transverse colon   . Diverticulosis of large intestine without diverticulitis   . Rectal bleeding 05/29/2015  . Non-intractable cyclical vomiting with nausea       Prior to Admission medications   Medication Sig Start Date End Date Taking? Authorizing Provider  amLODipine (NORVASC) 5 MG tablet Take 1 tablet by mouth daily.   Yes [provider]  furosemide (LASIX) 20 MG tablet Take 1 tablet by mouth daily.   Yes [provider]  gabapentin (NEURONTIN) 100 MG capsule Take 200 mg by mouth 3 (three) times daily.    Yes [provider]  ondansetron (ZOFRAN ODT) 4 MG disintegrating tablet Take 1 tablet (4 mg total) by mouth every 8 (eight) hours as needed for nausea or vomiting. 08/17/19  Yes Carrie Mew, MD  pravastatin (PRAVACHOL) 10 MG tablet Take 1 tablet by mouth every evening.   Yes [provider]    Allergies Oxycodone-acetaminophen and Tramadol  Social History Social History   Tobacco Use  . Smoking status: Former Research scientist (life sciences)  . Smokeless tobacco: Never Used  Substance Use Topics  . Alcohol use: No  . Drug use: No    Review of Systems Patient denies headaches, rhinorrhea, blurry vision, numbness, shortness of breath, chest pain, edema, cough, abdominal pain, nausea, vomiting, diarrhea, dysuria, fevers, rashes or hallucinations unless otherwise stated above in HPI. ____________________________________________   PHYSICAL EXAM:  VITAL SIGNS: Vitals:   08/24/19 1112 08/24/19 1503  BP:  119/68  Pulse:  (!) 58  Resp:  16  Temp:  98.6 F (37 C)  SpO2: 100% 100%     Constitutional: Alert and oriented.  Eyes: Conjunctivae are normal.  Head: Atraumatic. Nose: No congestion/rhinnorhea. Mouth/Throat: Mucous membranes are moist.   Neck: No stridor. Painless ROM.  Cardiovascular: Normal rate, regular rhythm. Grossly normal heart sounds.  Good peripheral circulation. Respiratory: Normal respiratory effort.  No retractions. Lungs CTAB. Gastrointestinal: Soft and nontender. No distention. No abdominal bruits. No CVA tenderness. Genitourinary:  Musculoskeletal: No lower extremity tenderness nor edema.  No joint effusions. Neurologic:  Normal speech and language. No gross focal neurologic deficits are appreciated. No facial droop Skin:  Skin is warm, dry and intact. No rash noted. Psychiatric: Mood and affect are normal. Speech and behavior are normal.  ____________________________________________   LABS (all labs ordered are listed, but only abnormal results are displayed)  Results for orders placed or performed during the hospital encounter of 08/24/19 (from the past 24 hour(s))  Lactic acid, plasma     Status: None   Collection Time: 08/24/19  9:52 AM  Result Value Ref Range   Lactic Acid, Venous 1.4 0.5 - 1.9 mmol/L  CBC WITH DIFFERENTIAL     Status: None   Collection Time: 08/24/19  9:52 AM  Result Value Ref Range   WBC 4.6 4.0 - 10.5 K/uL   RBC 4.86 3.87 - 5.11 MIL/uL   Hemoglobin 13.6 12.0 - 15.0 g/dL   HCT 42.4 36.0 - 46.0 %   MCV 87.2 80.0 - 100.0 fL   MCH 28.0 26.0 - 34.0 pg   MCHC 32.1 30.0 - 36.0 g/dL   RDW 14.4 11.5 - 15.5 %   Platelets 254 150 - 400 K/uL   nRBC 0.0 0.0 - 0.2 %   Neutrophils Relative % 65 %   Neutro Abs 3.0 1.7 - 7.7 K/uL   Lymphocytes Relative 20 %   Lymphs Abs 0.9 0.7 - 4.0 K/uL   Monocytes Relative 15 %   Monocytes Absolute 0.7 0.1 - 1.0 K/uL   Eosinophils Relative 0 %   Eosinophils Absolute 0.0 0.0 - 0.5 K/uL   Basophils Relative 0 %   Basophils Absolute 0.0 0.0 - 0.1 K/uL   Immature Granulocytes 0 %    Abs Immature Granulocytes 0.01 0.00 - 0.07 K/uL  Comprehensive metabolic panel     Status: Abnormal   Collection Time: 08/24/19  9:52 AM  Result Value Ref Range   Sodium 138 135 - 145 mmol/L   Potassium 3.4 (L) 3.5 - 5.1 mmol/L   Chloride 103 98 - 111 mmol/L   CO2 25 22 - 32 mmol/L   Glucose, Bld 107 (H) 70 - 99 mg/dL   BUN 6 (L) 8 - 23 mg/dL   Creatinine, Ser 0.75 0.44 - 1.00 mg/dL   Calcium 8.5 (L) 8.9 - 10.3 mg/dL   Total Protein 7.3 6.5 - 8.1 g/dL   Albumin 3.4 (L) 3.5 - 5.0 g/dL  AST 22 15 - 41 U/L   ALT 15 0 - 44 U/L   Alkaline Phosphatase 63 38 - 126 U/L   Total Bilirubin 0.9 0.3 - 1.2 mg/dL   GFR calc non Af Amer >60 >60 mL/min   GFR calc Af Amer >60 >60 mL/min   Anion gap 10 5 - 15  Fibrin derivatives D-Dimer     Status: Abnormal   Collection Time: 08/24/19  9:52 AM  Result Value Ref Range   Fibrin derivatives D-dimer ([facility]) 561.48 (H) 0.00 - 499.00 ng/mL (FEU)  Procalcitonin     Status: None   Collection Time: 08/24/19  9:52 AM  Result Value Ref Range   Procalcitonin <0.10 ng/mL  Lactate dehydrogenase     Status: Abnormal   Collection Time: 08/24/19  9:52 AM  Result Value Ref Range   LDH 201 (H) 98 - 192 U/L  Ferritin     Status: None   Collection Time: 08/24/19  9:52 AM  Result Value Ref Range   Ferritin 96 11 - 307 ng/mL  Triglycerides     Status: None   Collection Time: 08/24/19  9:52 AM  Result Value Ref Range   Triglycerides 92 <150 mg/dL  Fibrinogen     Status: Abnormal   Collection Time: 08/24/19  9:52 AM  Result Value Ref Range   Fibrinogen 521 (H) 210 - 475 mg/dL  Troponin I (High Sensitivity)     Status: None   Collection Time: 08/24/19  9:52 AM  Result Value Ref Range   Troponin I (High Sensitivity) 5 <18 ng/L  C-reactive protein     Status: Abnormal   Collection Time: 08/24/19  9:53 AM  Result Value Ref Range   CRP 5.0 (H) <1.0 mg/dL  Troponin I (High Sensitivity)     Status: None   Collection Time: 08/24/19  2:32 PM  Result Value Ref  Range   Troponin I (High Sensitivity) 6 <18 ng/L   ____________________________________________  EKG My review and personal interpretation at Time: 9:46   Indication: chest pain  Rate: 70  Rhythm: sinus Axis: normal Other: nonspecific t wave abnormality, no stemi or depression ____________________________________________  RADIOLOGY  I personally reviewed all radiographic images ordered to evaluate for the above acute complaints and reviewed radiology reports and findings.  These findings were personally discussed with the patient.  Please see medical record for radiology report.  ____________________________________________   PROCEDURES  Procedure(s) performed:  Procedures    Critical Care performed: no ____________________________________________   INITIAL IMPRESSION / ASSESSMENT AND PLAN / ED COURSE  Pertinent labs & imaging results that were available during my care of the patient were reviewed by me and considered in my medical decision making (see chart for details).   DDX: ACS, pericarditis, esophagitis, boerhaaves, pe, dissection, pna, bronchitis, costochondritis   Penny Hart is a 74 y.o. who presents to the ED with symptoms as described above.  Blood work and imaging several above differential given recent Covid illness.  She is nontoxic without any hypoxia.  She is in no acute distress.  No signs of ACS.  Will order CT to exclude PE.  Clinical Course as of Aug 24 1531  Sat Aug 24, 2019  1335 Patient remains hemodynamically stable.  Blood work has been reassuring.  No hypoxia.   [PR]    Clinical Course User Index [PR] Merlyn Lot, MD   Work-up and imaging is reassuring.  Patient requesting discharge home.  She is hemodynamically stable and appropriate for  discharge.  The patient was evaluated in Emergency Department today for the symptoms described in the history of present illness. He/she was evaluated in the context of the global COVID-19 pandemic,  which necessitated consideration that the patient might be at risk for infection with the SARS-CoV-2 virus that causes COVID-19. Institutional protocols and algorithms that pertain to the evaluation of patients at risk for COVID-19 are in a state of rapid change based on information released by regulatory bodies including the CDC and federal and state organizations. These policies and algorithms were followed during the patient's care in the ED.  As part of my medical decision making, I reviewed the following data within the [facility] notes reviewed and incorporated, Labs reviewed, notes from prior ED visits and [place] Controlled Substance Database   ____________________________________________   FINAL CLINICAL IMPRESSION(S) / ED DIAGNOSES  Final diagnoses:  Atypical chest pain  COVID-19 virus infection      NEW MEDICATIONS STARTED DURING THIS VISIT:  New Prescriptions   No medications on file     Note:  This document was prepared using Dragon voice recognition software and may include unintentional dictation errors.    Merlyn Lot, MD 08/24/19 [phone]

## 2019-08-24 NOTE — ED Notes (Signed)
Spoke with daughter ok to call a cab for patient, daughter will pay upon arrival home.

## 2019-08-24 NOTE — ED Notes (Signed)
Nicole Kindred Pt's Son [id number] or [id number]

## 2019-08-24 NOTE — ED Notes (Signed)
CT angio completed, patient back in room resting comfortably, vss. Safety maintained, call light w/i reach. Will continue to monitor.

## 2019-08-24 NOTE — ED Notes (Signed)
Assumed care of patient  reports no longer has chest pain. Patient states now her lower back feeling achy 3/10. Vss repeat lactate drawn and sent. patiuent awaiting further plan of care.

## 2019-08-24 NOTE — ED Triage Notes (Signed)
Pt arrived via EMS from home with c/o CP. EMS reports pt tested positive for Covid 7 days ago and has had diarrhea, nausea, without emesis. Pt reports intermittent sharp CP when she takes a deep breath. Pt is A&O x4 at this time with no pain.

## 2019-08-29 LAB — CULTURE, BLOOD (ROUTINE X 2)
Culture: NO GROWTH
Culture: NO GROWTH
Special Requests: ADEQUATE

## 2020-01-08 ENCOUNTER — Emergency Department: Payer: Medicare Other

## 2020-01-08 ENCOUNTER — Encounter: Payer: Self-pay | Admitting: Emergency Medicine

## 2020-01-08 ENCOUNTER — Other Ambulatory Visit: Payer: Self-pay

## 2020-01-08 ENCOUNTER — Emergency Department
Admission: EM | Admit: 2020-01-08 | Discharge: 2020-01-08 | Disposition: A | Discharge status: Home / Self Care | Payer: Medicare Other | Attending: Emergency Medicine | Admitting: Emergency Medicine

## 2020-01-08 DIAGNOSIS — Z5321 Procedure and treatment not carried out due to patient leaving prior to being seen by health care provider: Secondary | ICD-10-CM | POA: Diagnosis not present

## 2020-01-08 DIAGNOSIS — R079 Chest pain, unspecified: Secondary | ICD-10-CM | POA: Diagnosis not present

## 2020-01-08 DIAGNOSIS — M79602 Pain in left arm: Secondary | ICD-10-CM | POA: Diagnosis not present

## 2020-01-08 LAB — BASIC METABOLIC PANEL
Anion gap: 11 (ref 5–15)
BUN: 6 mg/dL — ABNORMAL LOW (ref 8–23)
CO2: 27 mmol/L (ref 22–32)
Calcium: 8.9 mg/dL (ref 8.9–10.3)
Chloride: 103 mmol/L (ref 98–111)
Creatinine, Ser: 0.68 mg/dL (ref 0.44–1.00)
GFR calc Af Amer: >60 mL/min (ref >60)
GFR calc non Af Amer: >60 mL/min (ref >60)
Glucose, Bld: 92 mg/dL (ref 70–99)
Potassium: 3.2 mmol/L — ABNORMAL LOW (ref 3.5–5.1)
Sodium: 141 mmol/L (ref 135–145)

## 2020-01-08 LAB — CBC
HCT: 39 % (ref 36.0–46.0)
Hemoglobin: 12.7 g/dL (ref 12.0–15.0)
MCH: 28.3 pg (ref 26.0–34.0)
MCHC: 32.6 g/dL (ref 30.0–36.0)
MCV: 86.9 fL (ref 80.0–100.0)
Platelets: 267 10*3/uL (ref 150–400)
RBC: 4.49 MIL/uL (ref 3.87–5.11)
RDW: 13.9 % (ref 11.5–15.5)
WBC: 5.7 10*3/uL (ref 4.0–10.5)
nRBC: 0 % (ref 0.0–0.2)

## 2020-01-08 LAB — TROPONIN I (HIGH SENSITIVITY)
Troponin I (High Sensitivity): 3 ng/L (ref <18)
Troponin I (High Sensitivity): 3 ng/L (ref <18)

## 2020-01-08 NOTE — ED Triage Notes (Signed)
Patient reports sharp chest pain that started this morning. Reports pain increases with exertion. States her left arm has been hurting "for a while". Patient denies SOB/nausea.

## 2020-01-09 ENCOUNTER — Other Ambulatory Visit
Admission: RE | Admit: 2020-01-09 | Discharge: 2020-01-09 | Disposition: A | Discharge status: Home / Self Care | Payer: Medicare Other | Source: Ambulatory Visit | Attending: Student | Admitting: Student

## 2020-01-09 DIAGNOSIS — Z01812 Encounter for preprocedural laboratory examination: Secondary | ICD-10-CM | POA: Insufficient documentation

## 2020-01-09 DIAGNOSIS — I10 Essential (primary) hypertension: Secondary | ICD-10-CM | POA: Insufficient documentation

## 2020-01-09 DIAGNOSIS — I25119 Atherosclerotic heart disease of native coronary artery with unspecified angina pectoris: Secondary | ICD-10-CM | POA: Insufficient documentation

## 2020-01-09 LAB — BRAIN NATRIURETIC PEPTIDE: B Natriuretic Peptide: 43.7 pg/mL (ref 0.0–100.0)

## 2020-01-28 ENCOUNTER — Other Ambulatory Visit
Admission: RE | Admit: 2020-01-28 | Discharge: 2020-01-28 | Disposition: A | Discharge status: Home / Self Care | Payer: Medicare Other | Source: Ambulatory Visit | Attending: Internal Medicine | Admitting: Internal Medicine

## 2020-01-28 ENCOUNTER — Other Ambulatory Visit: Payer: Self-pay

## 2020-01-28 DIAGNOSIS — Z01812 Encounter for preprocedural laboratory examination: Secondary | ICD-10-CM | POA: Insufficient documentation

## 2020-01-28 DIAGNOSIS — Z20822 Contact with and (suspected) exposure to covid-19: Secondary | ICD-10-CM | POA: Diagnosis not present

## 2020-01-28 LAB — SARS CORONAVIRUS 2 (TAT 6-24 HRS): SARS Coronavirus 2: NEGATIVE

## 2020-01-30 ENCOUNTER — Ambulatory Visit
Admission: RE | Admit: 2020-01-30 | Discharge: 2020-01-30 | Disposition: A | Discharge status: Home / Self Care | Payer: Medicare Other | Attending: Internal Medicine | Admitting: Internal Medicine

## 2020-01-30 ENCOUNTER — Other Ambulatory Visit: Payer: Self-pay

## 2020-01-30 ENCOUNTER — Encounter: Payer: Self-pay | Admitting: Internal Medicine

## 2020-01-30 ENCOUNTER — Encounter
Admission: RE | Disposition: A | Discharge status: Home / Self Care | Payer: Self-pay | Source: Home / Self Care | Attending: Internal Medicine

## 2020-01-30 DIAGNOSIS — E1140 Type 2 diabetes mellitus with diabetic neuropathy, unspecified: Secondary | ICD-10-CM | POA: Insufficient documentation

## 2020-01-30 DIAGNOSIS — Z6828 Body mass index (BMI) 28.0-28.9, adult: Secondary | ICD-10-CM | POA: Diagnosis not present

## 2020-01-30 DIAGNOSIS — Z7982 Long term (current) use of aspirin: Secondary | ICD-10-CM | POA: Insufficient documentation

## 2020-01-30 DIAGNOSIS — Z79899 Other long term (current) drug therapy: Secondary | ICD-10-CM | POA: Diagnosis not present

## 2020-01-30 DIAGNOSIS — I25119 Atherosclerotic heart disease of native coronary artery with unspecified angina pectoris: Secondary | ICD-10-CM | POA: Diagnosis not present

## 2020-01-30 DIAGNOSIS — Z87891 Personal history of nicotine dependence: Secondary | ICD-10-CM | POA: Insufficient documentation

## 2020-01-30 DIAGNOSIS — R079 Chest pain, unspecified: Secondary | ICD-10-CM

## 2020-01-30 DIAGNOSIS — E782 Mixed hyperlipidemia: Secondary | ICD-10-CM | POA: Diagnosis not present

## 2020-01-30 DIAGNOSIS — I10 Essential (primary) hypertension: Secondary | ICD-10-CM | POA: Insufficient documentation

## 2020-01-30 DIAGNOSIS — E669 Obesity, unspecified: Secondary | ICD-10-CM | POA: Insufficient documentation

## 2020-01-30 DIAGNOSIS — G4730 Sleep apnea, unspecified: Secondary | ICD-10-CM | POA: Insufficient documentation

## 2020-01-30 DIAGNOSIS — I700 Atherosclerosis of aorta: Secondary | ICD-10-CM | POA: Diagnosis not present

## 2020-01-30 DIAGNOSIS — R262 Difficulty in walking, not elsewhere classified: Secondary | ICD-10-CM | POA: Insufficient documentation

## 2020-01-30 DIAGNOSIS — K219 Gastro-esophageal reflux disease without esophagitis: Secondary | ICD-10-CM | POA: Insufficient documentation

## 2020-01-30 DIAGNOSIS — J449 Chronic obstructive pulmonary disease, unspecified: Secondary | ICD-10-CM | POA: Diagnosis not present

## 2020-01-30 DIAGNOSIS — R609 Edema, unspecified: Secondary | ICD-10-CM | POA: Insufficient documentation

## 2020-01-30 DIAGNOSIS — R002 Palpitations: Secondary | ICD-10-CM | POA: Diagnosis not present

## 2020-01-30 HISTORY — DX: Headache, unspecified: R51.9

## 2020-01-30 HISTORY — PX: LEFT HEART CATH AND CORONARY ANGIOGRAPHY: CATH118249

## 2020-01-30 LAB — GLUCOSE, CAPILLARY: Glucose-Capillary: 96 mg/dL (ref 70–99)

## 2020-01-30 SURGERY — LEFT HEART CATH AND CORONARY ANGIOGRAPHY
Anesthesia: Moderate Sedation | Laterality: Left

## 2020-01-30 MED ORDER — SODIUM CHLORIDE 0.9 % IV BOLUS
INTRAVENOUS | Status: AC | PRN
  Administered 2020-01-30: 250 mL via INTRAVENOUS

## 2020-01-30 MED ORDER — SODIUM CHLORIDE 0.9 % IV SOLN: 250.0000 mL | INTRAVENOUS | Status: DC | PRN

## 2020-01-30 MED ORDER — SODIUM CHLORIDE 0.9% FLUSH: 3.0000 mL | Freq: Two times a day (BID) | INTRAVENOUS | Status: DC

## 2020-01-30 MED ORDER — SODIUM CHLORIDE 0.9% FLUSH: 3.0000 mL | INTRAVENOUS | Status: DC | PRN

## 2020-01-30 MED ORDER — FENTANYL CITRATE (PF) 100 MCG/2ML IJ SOLN
INTRAMUSCULAR | Status: DC | PRN
  Administered 2020-01-30: 12.5 ug via INTRAVENOUS

## 2020-01-30 MED ORDER — POTASSIUM CHLORIDE CRYS ER 20 MEQ PO TBCR
EXTENDED_RELEASE_TABLET | ORAL | Status: AC
  Filled 2020-01-30: qty 2

## 2020-01-30 MED ORDER — MIDAZOLAM HCL 2 MG/2ML IJ SOLN
INTRAMUSCULAR | Status: AC
  Filled 2020-01-30: qty 2

## 2020-01-30 MED ORDER — HYDRALAZINE HCL 20 MG/ML IJ SOLN: 10.0000 mg | INTRAMUSCULAR | Status: DC | PRN

## 2020-01-30 MED ORDER — ASPIRIN 81 MG PO CHEW: 81.0000 mg | CHEWABLE_TABLET | ORAL | Status: DC

## 2020-01-30 MED ORDER — IOHEXOL 300 MG/ML  SOLN
INTRAMUSCULAR | Status: DC | PRN
  Administered 2020-01-30: 50 mL

## 2020-01-30 MED ORDER — SODIUM CHLORIDE 0.9 % WEIGHT BASED INFUSION: 1.0000 mL/kg/h | INTRAVENOUS | Status: DC

## 2020-01-30 MED ORDER — ACETAMINOPHEN 325 MG PO TABS: 650.0000 mg | ORAL_TABLET | ORAL | Status: DC | PRN

## 2020-01-30 MED ORDER — SODIUM CHLORIDE 0.9 % WEIGHT BASED INFUSION
3.0000 mL/kg/h | INTRAVENOUS | Status: AC
  Administered 2020-01-30: 3 mL/kg/h via INTRAVENOUS

## 2020-01-30 MED ORDER — LABETALOL HCL 5 MG/ML IV SOLN: 10.0000 mg | INTRAVENOUS | Status: DC | PRN

## 2020-01-30 MED ORDER — ONDANSETRON HCL 4 MG/2ML IJ SOLN: 4.0000 mg | Freq: Four times a day (QID) | INTRAMUSCULAR | Status: DC | PRN

## 2020-01-30 MED ORDER — POTASSIUM CHLORIDE CRYS ER 10 MEQ PO TBCR
EXTENDED_RELEASE_TABLET | ORAL | Status: DC | PRN
  Administered 2020-01-30: 40 meq via ORAL

## 2020-01-30 MED ORDER — FENTANYL CITRATE (PF) 100 MCG/2ML IJ SOLN
INTRAMUSCULAR | Status: AC
  Filled 2020-01-30: qty 2

## 2020-01-30 MED ORDER — MIDAZOLAM HCL 2 MG/2ML IJ SOLN
INTRAMUSCULAR | Status: DC | PRN
  Administered 2020-01-30: 0.5 mg via INTRAVENOUS

## 2020-01-30 MED ORDER — HEPARIN (PORCINE) IN NACL 1000-0.9 UT/500ML-% IV SOLN
INTRAVENOUS | Status: DC | PRN
  Administered 2020-01-30: 500 mL

## 2020-01-30 MED ORDER — LIDOCAINE HCL (PF) 1 % IJ SOLN
INTRAMUSCULAR | Status: AC
  Filled 2020-01-30: qty 30

## 2020-01-30 MED ORDER — HEPARIN (PORCINE) IN NACL 1000-0.9 UT/500ML-% IV SOLN
INTRAVENOUS | Status: AC
  Filled 2020-01-30: qty 1000

## 2020-01-30 SURGICAL SUPPLY — 8 items
CATH INFINITI 5FR JL4 (CATHETERS) ×2 IMPLANT
CATH INFINITI JR4 5F (CATHETERS) ×2 IMPLANT
DEVICE CLOSURE MYNXGRIP 5F (Vascular Products) ×2 IMPLANT
KIT MANI 3VAL PERCEP (MISCELLANEOUS) ×2 IMPLANT
NEEDLE PERC 18GX7CM (NEEDLE) ×2 IMPLANT
PACK CARDIAC CATH (CUSTOM PROCEDURE TRAY) ×2 IMPLANT
SHEATH AVANTI 5FR X 11CM (SHEATH) ×2 IMPLANT
WIRE GUIDERIGHT .035X150 (WIRE) ×2 IMPLANT

## 2020-01-30 NOTE — Discharge Instructions (Signed)

## 2020-01-31 ENCOUNTER — Encounter: Payer: Self-pay | Admitting: Internal Medicine

## 2020-05-28 ENCOUNTER — Other Ambulatory Visit
Admission: RE | Admit: 2020-05-28 | Discharge: 2020-05-28 | Disposition: A | Discharge status: Home / Self Care | Payer: Medicare Other | Source: Ambulatory Visit | Attending: Gastroenterology | Admitting: Gastroenterology

## 2020-05-28 ENCOUNTER — Other Ambulatory Visit: Payer: Self-pay

## 2020-05-28 DIAGNOSIS — Z20822 Contact with and (suspected) exposure to covid-19: Secondary | ICD-10-CM | POA: Insufficient documentation

## 2020-05-28 DIAGNOSIS — Z01812 Encounter for preprocedural laboratory examination: Secondary | ICD-10-CM | POA: Insufficient documentation

## 2020-05-29 ENCOUNTER — Encounter: Payer: Self-pay | Admitting: *Deleted

## 2020-05-29 LAB — SARS CORONAVIRUS 2 (TAT 6-24 HRS): SARS Coronavirus 2: NEGATIVE

## 2020-06-01 ENCOUNTER — Encounter: Payer: Self-pay | Admitting: *Deleted

## 2020-06-01 ENCOUNTER — Other Ambulatory Visit: Payer: Self-pay

## 2020-06-01 ENCOUNTER — Ambulatory Visit: Payer: Medicare Other | Admitting: Certified Registered Nurse Anesthetist

## 2020-06-01 ENCOUNTER — Encounter
Admission: RE | Disposition: A | Discharge status: Home / Self Care | Payer: Self-pay | Source: Home / Self Care | Attending: Gastroenterology

## 2020-06-01 ENCOUNTER — Ambulatory Visit
Admission: RE | Admit: 2020-06-01 | Discharge: 2020-06-01 | Disposition: A | Discharge status: Home / Self Care | Payer: Medicare Other | Attending: Gastroenterology | Admitting: Gastroenterology

## 2020-06-01 DIAGNOSIS — Z86010 Personal history of colonic polyps: Secondary | ICD-10-CM | POA: Insufficient documentation

## 2020-06-01 DIAGNOSIS — R634 Abnormal weight loss: Secondary | ICD-10-CM | POA: Diagnosis not present

## 2020-06-01 DIAGNOSIS — K640 First degree hemorrhoids: Secondary | ICD-10-CM | POA: Diagnosis not present

## 2020-06-01 DIAGNOSIS — Z7982 Long term (current) use of aspirin: Secondary | ICD-10-CM | POA: Diagnosis not present

## 2020-06-01 DIAGNOSIS — D124 Benign neoplasm of descending colon: Secondary | ICD-10-CM | POA: Insufficient documentation

## 2020-06-01 DIAGNOSIS — Z6829 Body mass index (BMI) 29.0-29.9, adult: Secondary | ICD-10-CM | POA: Insufficient documentation

## 2020-06-01 DIAGNOSIS — K5790 Diverticulosis of intestine, part unspecified, without perforation or abscess without bleeding: Secondary | ICD-10-CM | POA: Diagnosis not present

## 2020-06-01 DIAGNOSIS — Z79899 Other long term (current) drug therapy: Secondary | ICD-10-CM | POA: Diagnosis not present

## 2020-06-01 DIAGNOSIS — K621 Rectal polyp: Secondary | ICD-10-CM | POA: Diagnosis not present

## 2020-06-01 DIAGNOSIS — Z1211 Encounter for screening for malignant neoplasm of colon: Secondary | ICD-10-CM | POA: Diagnosis not present

## 2020-06-01 DIAGNOSIS — R0789 Other chest pain: Secondary | ICD-10-CM | POA: Insufficient documentation

## 2020-06-01 HISTORY — DX: Spinal stenosis, site unspecified: M48.00

## 2020-06-01 HISTORY — PX: COLONOSCOPY: SHX5424

## 2020-06-01 HISTORY — DX: Atherosclerotic heart disease of native coronary artery without angina pectoris: I25.10

## 2020-06-01 HISTORY — DX: Polyneuropathy, unspecified: G62.9

## 2020-06-01 HISTORY — DX: Obesity, unspecified: E66.9

## 2020-06-01 HISTORY — PX: ESOPHAGOGASTRODUODENOSCOPY: SHX5428

## 2020-06-01 HISTORY — DX: Atherosclerosis of aorta: I70.0

## 2020-06-01 HISTORY — DX: Presence of intraocular lens: Z96.1

## 2020-06-01 LAB — GLUCOSE, CAPILLARY: Glucose-Capillary: 99 mg/dL (ref 70–99)

## 2020-06-01 SURGERY — COLONOSCOPY
Anesthesia: General

## 2020-06-01 MED ORDER — PROPOFOL 500 MG/50ML IV EMUL
INTRAVENOUS | Status: DC | PRN
  Administered 2020-06-01: 130 ug/kg/min via INTRAVENOUS

## 2020-06-01 MED ORDER — LIDOCAINE HCL (CARDIAC) PF 100 MG/5ML IV SOSY
PREFILLED_SYRINGE | INTRAVENOUS | Status: DC | PRN
  Administered 2020-06-01: 50 mg via INTRAVENOUS

## 2020-06-01 MED ORDER — PROPOFOL 10 MG/ML IV BOLUS
INTRAVENOUS | Status: DC | PRN
  Administered 2020-06-01: 80 mg via INTRAVENOUS

## 2020-06-01 MED ORDER — LIDOCAINE HCL (PF) 2 % IJ SOLN
INTRAMUSCULAR | Status: AC
  Filled 2020-06-01: qty 5

## 2020-06-01 MED ORDER — PROPOFOL 500 MG/50ML IV EMUL
INTRAVENOUS | Status: AC
  Filled 2020-06-01: qty 50

## 2020-06-01 MED ORDER — SODIUM CHLORIDE 0.9 % IV SOLN
INTRAVENOUS | Status: DC
  Administered 2020-06-01: 20 mL/h via INTRAVENOUS

## 2020-06-01 NOTE — Op Note (Signed)
[facility]enterology Patient Name: Penny Hart Procedure Date: 06/01/2020 9:20 AM MRN: [id number] Account #: [id number] Date of Birth: 1945/12/03 Admit Type: Outpatient Age: 74 Room: [facility] ENDO ROOM 3 Gender: Female Note Status: Finalized Procedure:             Upper GI endoscopy Indications:           Chest pain (non cardiac), Weight loss Providers:             Andrey Farmer MD, MD Referring MD:          Ocie Cornfield. Ouida Sills MD, MD (Referring MD) Medicines:             Monitored Anesthesia Care Complications:         No immediate complications. Estimated blood loss:                         Minimal. Procedure:             Pre-Anesthesia Assessment:                        - Prior to the procedure, a History and Physical was                         performed, and patient medications and allergies were                         reviewed. The patient is competent. The risks and                         benefits of the procedure and the sedation options and                         risks were discussed with the patient. All questions                         were answered and informed consent was obtained.                         Patient identification and proposed procedure were                         verified by the physician, the nurse, the anesthetist                         and the technician in the endoscopy suite. Mental                         Status Examination: alert and oriented. Airway                         Examination: normal oropharyngeal airway and neck                         mobility. Respiratory Examination: clear to                         auscultation. CV Examination: normal. Prophylactic  Antibiotics: The patient does not require prophylactic                         antibiotics. Prior Anticoagulants: The patient has                         taken no previous anticoagulant or antiplatelet                          agents. ASA Grade Assessment: II - A patient with mild                         systemic disease. After reviewing the risks and                         benefits, the patient was deemed in satisfactory                         condition to undergo the procedure. The anesthesia                         plan was to use monitored anesthesia care (MAC).                         Immediately prior to administration of medications,                         the patient was re-assessed for adequacy to receive                         sedatives. The heart rate, respiratory rate, oxygen                         saturations, blood pressure, adequacy of pulmonary                         ventilation, and response to care were monitored                         throughout the procedure. The physical status of the                         patient was re-assessed after the procedure.                        After obtaining informed consent, the endoscope was                         passed under direct vision. Throughout the procedure,                         the patient's blood pressure, pulse, and oxygen                         saturations were monitored continuously. The Endoscope                         was introduced through the mouth, and advanced to the  second part of duodenum. The upper GI endoscopy was                         accomplished without difficulty. The patient tolerated                         the procedure well. Findings:      The examined esophagus was normal.      The entire examined stomach was normal.      Very prominent ampulla. Atypical location and anatomy as it appeared       pedunculated. Biopsies were taken with a cold forceps for histology.       Estimated blood loss was minimal.      The exam of the duodenum was otherwise normal. Impression:            - Normal esophagus.                        - Normal stomach. Recommendation:        - Discharge patient to  home.                        - Resume previous diet.                        - Continue present medications.                        - Await pathology results.                        - Return to referring physician as previously                         scheduled. Procedure Code(s):     --- Professional ---                        [phone], Esophagogastroduodenoscopy, flexible,                         transoral; with biopsy, single or multiple Diagnosis Code(s):     --- Professional ---                        R07.89, Other chest pain                        R63.4, Abnormal weight loss CPT copyright 2019 American Medical Association. All rights reserved. The codes documented in this report are preliminary and upon coder review may  be revised to meet current compliance requirements. Andrey Farmer, MD Andrey Farmer MD, MD 06/01/2020 10:02:10 AM Number of Addenda: 0 Note Initiated On: 06/01/2020 9:20 AM Estimated Blood Loss:  Estimated blood loss was minimal.      [facility]

## 2020-06-01 NOTE — H&P (Signed)
Outpatient short stay form Pre-procedure 06/01/2020 9:16 AM Raylene Miyamoto MD, MPH  Primary Physician: Dr. Ouida Sills  Reason for visit:  Abdominal pain and history of polyps  History of present illness:   74 y/o lady with history of atypical chest pain and polyps here for EGD/Colonoscopy. No family history of GI malignancies, no blood thinners. Patient unclear if she has a history of an abdominal surgery. No new symptoms.    Current Facility-Administered Medications:  .  0.9 %  sodium chloride infusion, , Intravenous, Continuous, Ravneet Spilker, Hilton Cork, MD, Last Rate: 20 mL/hr at 06/01/20 0828, 20 mL/hr at 06/01/20 1027  Medications Prior to Admission  Medication Sig Dispense Refill Last Dose  . amLODipine (NORVASC) 5 MG tablet Take 5 mg by mouth daily.    06/01/2020 at 0615  . aspirin EC 81 MG tablet Take 81 mg by mouth daily. Swallow whole.   05/31/2020 at Unknown time  . esomeprazole (NEXIUM) 40 MG capsule Take 40 mg by mouth daily at 12 noon.   05/31/2020 at Unknown time  . furosemide (LASIX) 20 MG tablet Take 1 tablet by mouth daily.   05/31/2020 at Unknown time  . gabapentin (NEURONTIN) 100 MG capsule Take 200 mg by mouth 3 (three) times daily.    05/31/2020 at Unknown time  . mirtazapine (REMERON) 7.5 MG tablet Take 7.5 mg by mouth at bedtime.   05/31/2020 at Unknown time  . ondansetron (ZOFRAN ODT) 4 MG disintegrating tablet Take 1 tablet (4 mg total) by mouth every 8 (eight) hours as needed for nausea or vomiting. 20 tablet 0 05/31/2020 at Unknown time  . pravastatin (PRAVACHOL) 10 MG tablet Take 1 tablet by mouth every evening.   05/31/2020 at Unknown time  . prednisoLONE acetate (PRED FORTE) 1 % ophthalmic suspension 1 drop 4 (four) times daily.   05/31/2020 at Unknown time     Allergies  Allergen Reactions  . Oxycodone-Acetaminophen Rash    UNSPECIFIED REACTION    . Tramadol Rash and Other (See Comments)    INTOLERANCE > Chest pain Other reaction(s): Other (See Comments) Chest  pain     Past Medical History:  Diagnosis Date  . Arthritis   . CHF (congestive heart failure) ([place])   . COPD (chronic obstructive pulmonary disease) ([place])   . Coronary artery disease   . Diabetes mellitus without complication ([place])   . GERD (gastroesophageal reflux disease)   . Glaucoma   . Headache   . Hiatal hernia   . Hyperlipidemia   . Hypertension   . Neuropathy   . Obesity   . Peripheral neuropathy   . Pseudophakia   . Sleep apnea   . Spinal stenosis   . Spondylolisthesis of lumbar region   . Thoracic aortic atherosclerosis ([place])     Review of systems:  Otherwise negative.    Physical Exam  Gen: Alert, oriented. Appears stated age.  HEENT: [place]/AT. PERRLA. Lungs: No respiratory distress CV: RRR Abd: soft, benign, no masses Ext: No edema.    Planned procedures: Proceed with EGD/colonoscopy. The patient understands the nature of the planned procedure, indications, risks, alternatives and potential complications including but not limited to bleeding, infection, perforation, damage to internal organs and possible oversedation/side effects from anesthesia. The patient agrees and gives consent to proceed.  Please refer to procedure notes for findings, recommendations and patient disposition/instructions.     Raylene Miyamoto MD, MPH Gastroenterology 06/01/2020  9:16 AM

## 2020-06-01 NOTE — Op Note (Signed)
[facility] Patient Name: Penny Hart Procedure Date: 06/01/2020 9:19 AM MRN: [id number] Account #: [id number] Date of Birth: 10/27/1945 Admit Type: Outpatient Age: 74 Room: [facility] ENDO ROOM 3 Gender: Female Note Status: Finalized Procedure:             Colonoscopy Indications:           Surveillance: Personal history of adenomatous polyps                         on last colonoscopy 5 years ago Providers:             Andrey Farmer MD, MD Referring MD:          Ocie Cornfield. Ouida Sills MD, MD (Referring MD) Medicines:             Monitored Anesthesia Care Complications:         No immediate complications. Estimated blood loss:                         Minimal. Procedure:             Pre-Anesthesia Assessment:                        - Prior to the procedure, a History and Physical was                         performed, and patient medications and allergies were                         reviewed. The patient is competent. The risks and                         benefits of the procedure and the sedation options and                         risks were discussed with the patient. All questions                         were answered and informed consent was obtained.                         Patient identification and proposed procedure were                         verified by the physician, the nurse, the anesthetist                         and the technician in the endoscopy suite. Mental                         Status Examination: alert and oriented. Airway                         Examination: normal oropharyngeal airway and neck                         mobility. Respiratory Examination: clear to  auscultation. CV Examination: normal. Prophylactic                         Antibiotics: The patient does not require prophylactic                         antibiotics. Prior Anticoagulants: The patient has                         taken no  previous anticoagulant or antiplatelet                         agents. ASA Grade Assessment: II - A patient with mild                         systemic disease. After reviewing the risks and                         benefits, the patient was deemed in satisfactory                         condition to undergo the procedure. The anesthesia                         plan was to use monitored anesthesia care (MAC).                         Immediately prior to administration of medications,                         the patient was re-assessed for adequacy to receive                         sedatives. The heart rate, respiratory rate, oxygen                         saturations, blood pressure, adequacy of pulmonary                         ventilation, and response to care were monitored                         throughout the procedure. The physical status of the                         patient was re-assessed after the procedure.                        After obtaining informed consent, the colonoscope was                         passed under direct vision. Throughout the procedure,                         the patient's blood pressure, pulse, and oxygen                         saturations were monitored continuously. The  Colonoscope was introduced through the anus and                         advanced to the the cecum, identified by appendiceal                         orifice and ileocecal valve. The colonoscopy was                         performed without difficulty. The patient tolerated                         the procedure well. The quality of the bowel                         preparation was good. Findings:      The perianal and digital rectal examinations were normal.      A few small-mouthed diverticula were found in the sigmoid colon,       descending colon, transverse colon and ascending colon.      A 4 mm polyp was found in the descending colon. The polyp was sessile.        The polyp was removed with a cold snare. Resection and retrieval were       complete. Estimated blood loss was minimal.      A 2 mm polyp was found in the rectum. The polyp was sessile. The polyp       was removed with a jumbo cold forceps. Resection and retrieval were       complete. Estimated blood loss was minimal.      Non-bleeding internal hemorrhoids were found during retroflexion. The       hemorrhoids were Grade I (internal hemorrhoids that do not prolapse).      The exam was otherwise without abnormality on direct and retroflexion       views. Due to technical difficulties, ileocecal valve picture was not       captured. Impression:            - Diverticulosis in the sigmoid colon, in the                         descending colon, in the transverse colon and in the                         ascending colon.                        - One 4 mm polyp in the descending colon, removed with                         a cold snare. Resected and retrieved.                        - One 2 mm polyp in the rectum, removed with a jumbo                         cold forceps. Resected and retrieved.                        - Non-bleeding internal  hemorrhoids.                        - The examination was otherwise normal on direct and                         retroflexion views. Recommendation:        - Discharge patient to home.                        - Resume previous diet.                        - Continue present medications.                        - Await pathology results.                        - Repeat colonoscopy in 7 years for surveillance.                        - Return to referring physician as previously                         scheduled. Procedure Code(s):     --- Professional ---                        [phone], Colonoscopy, flexible; with removal of                         tumor(s), polyp(s), or other lesion(s) by snare                         technique                         45380, 17, Colonoscopy, flexible; with biopsy, single                         or multiple Diagnosis Code(s):     --- Professional ---                        Z86.010, Personal history of colonic polyps                        K64.0, First degree hemorrhoids                        K63.5, Polyp of colon                        K62.1, Rectal polyp                        K57.30, Diverticulosis of large intestine without                         perforation or abscess without bleeding CPT copyright 2019 American Medical Association. All rights reserved. The codes documented in this report are preliminary and upon coder review may  be revised to meet current compliance requirements. Andrey Farmer, MD Lysbeth Galas  Odean Mcelwain MD, MD 06/01/2020 10:05:53 AM Number of Addenda: 0 Note Initiated On: 06/01/2020 9:19 AM Scope Withdrawal Time: 0 hours 14 minutes 33 seconds  Total Procedure Duration: 0 hours 20 minutes 0 seconds  Estimated Blood Loss:  Estimated blood loss was minimal.      [facility]

## 2020-06-01 NOTE — Anesthesia Preprocedure Evaluation (Signed)
Anesthesia Evaluation  Patient identified by MRN, date of birth, ID band Patient awake    Reviewed: Allergy & Precautions, H&P , NPO status , Patient's Chart, lab work & pertinent test results  History of Anesthesia Complications Negative for: history of anesthetic complications  Airway Mallampati: III  TM Distance: >3 FB Neck ROM: limited    Dental  (+) Poor Dentition, Missing, Upper Dentures, Lower Dentures, Dental Advidsory Given   Pulmonary neg shortness of breath, sleep apnea , COPD,  COPD inhaler, neg recent URI, former smoker,    Pulmonary exam normal breath sounds clear to auscultation       Cardiovascular Exercise Tolerance: Good hypertension, (-) angina+ CAD, + Past MI and +CHF  (-) Cardiac Stents and (-) DOE Normal cardiovascular exam(-) dysrhythmias (-) Valvular Problems/Murmurs Rhythm:regular Rate:Normal     Neuro/Psych negative neurological ROS  negative psych ROS   GI/Hepatic Neg liver ROS, hiatal hernia, GERD  Controlled,  Endo/Other  diabetes, Well Controlled, Type 2  Renal/GU negative Renal ROS  negative genitourinary   Musculoskeletal   Abdominal   Peds  Hematology negative hematology ROS (+)   Anesthesia Other Findings Past Medical History:   Diabetes mellitus without complication (HCC)                 Hypertension                                                 Glaucoma                                                     COPD (chronic obstructive pulmonary disease) (*              Neuropathy (HCC)                                             Hyperlipidemia                                              Past Surgical History:   ABDOMINAL HYSTERECTOMY                                        OOPHORECTOMY                                                  Left total knee replacement                                   Eye surgery for glaucoma  BMI    Body Mass  Index   34.28 kg/m 2      Reproductive/Obstetrics negative OB ROS                             Anesthesia Physical  Anesthesia Plan  ASA: III  Anesthesia Plan: General   Post-op Pain Management:    Induction: Intravenous  PONV Risk Score and Plan: 3 and Propofol infusion and TIVA  Airway Management Planned: Natural Airway and Nasal Cannula  Additional Equipment:   Intra-op Plan:   Post-operative Plan:   Informed Consent: I have reviewed the patients History and Physical, chart, labs and discussed the procedure including the risks, benefits and alternatives for the proposed anesthesia with the patient or authorized representative who has indicated his/her understanding and acceptance.     Dental Advisory Given  Plan Discussed with: Anesthesiologist, CRNA and Surgeon  Anesthesia Plan Comments:         Anesthesia Quick Evaluation

## 2020-06-01 NOTE — Interval H&P Note (Signed)
History and Physical Interval Note:  06/01/2020 9:19 AM  Penny Hart  has presented today for surgery, with the diagnosis of HX.OF COLON POLYPS, GERD.  The various methods of treatment have been discussed with the patient and family. After consideration of risks, benefits and other options for treatment, the patient has consented to  Procedure(s): COLONOSCOPY (N/A) ESOPHAGOGASTRODUODENOSCOPY (EGD) (N/A) as a surgical intervention.  The patient's history has been reviewed, patient examined, no change in status, stable for surgery.  I have reviewed the patient's chart and labs.  Questions were answered to the patient's satisfaction.     Lesly Rubenstein  Ok to proceed with EGD/Colonoscopy

## 2020-06-01 NOTE — Anesthesia Postprocedure Evaluation (Signed)
Anesthesia Post Note  Patient: Penny Hart  Procedure(s) Performed: COLONOSCOPY (N/A ) ESOPHAGOGASTRODUODENOSCOPY (EGD) (N/A )  Patient location during evaluation: Endoscopy Anesthesia Type: General Level of consciousness: awake and alert Pain management: pain level controlled Vital Signs Assessment: post-procedure vital signs reviewed and stable Respiratory status: spontaneous breathing, nonlabored ventilation, respiratory function stable and patient connected to nasal cannula oxygen Cardiovascular status: blood pressure returned to baseline and stable Postop Assessment: no apparent nausea or vomiting Anesthetic complications: no   No complications documented.   Last Vitals:  Vitals:   06/01/20 1020 06/01/20 1030  BP: (!) 86/55 98/78  Pulse: (!) 43 (!) 45  Resp: 15 18  Temp:    SpO2: (!) 88% 99%    Last Pain:  Vitals:   06/01/20 0950  TempSrc: Temporal  PainSc:                  [name]

## 2020-06-01 NOTE — Transfer of Care (Signed)
Immediate Anesthesia Transfer of Care Note  Patient: Penny Hart  Procedure(s) Performed: COLONOSCOPY (N/A ) ESOPHAGOGASTRODUODENOSCOPY (EGD) (N/A )  Patient Location: PACU and Endoscopy Unit  Anesthesia Type:General  Level of Consciousness: drowsy  Airway & Oxygen Therapy: Patient Spontanous Breathing  Post-op Assessment: Report given to RN and Post -op Vital signs reviewed and stable  Post vital signs: Reviewed and stable  Last Vitals:  Vitals Value Taken Time  BP 97/60 06/01/20 0959  Temp    Pulse 54 06/01/20 1000  Resp 11 06/01/20 1000  SpO2 96 % 06/01/20 1000  Vitals shown include unvalidated device data.  Last Pain:  Vitals:   06/01/20 0817  TempSrc: Temporal  PainSc: 0-No pain         Complications: No complications documented.

## 2020-06-03 ENCOUNTER — Encounter: Payer: Self-pay | Admitting: Gastroenterology

## 2020-06-03 LAB — SURGICAL PATHOLOGY

## 2020-08-20 ENCOUNTER — Other Ambulatory Visit: Payer: Self-pay | Admitting: Internal Medicine

## 2020-08-20 ENCOUNTER — Other Ambulatory Visit (HOSPITAL_COMMUNITY): Payer: Self-pay | Admitting: Internal Medicine

## 2020-08-20 DIAGNOSIS — R270 Ataxia, unspecified: Secondary | ICD-10-CM

## 2020-08-28 ENCOUNTER — Other Ambulatory Visit: Payer: Self-pay | Admitting: Family Medicine

## 2020-08-28 DIAGNOSIS — R270 Ataxia, unspecified: Secondary | ICD-10-CM

## 2020-09-03 ENCOUNTER — Other Ambulatory Visit: Payer: Self-pay

## 2020-09-03 ENCOUNTER — Ambulatory Visit
Admission: RE | Admit: 2020-09-03 | Discharge: 2020-09-03 | Disposition: A | Discharge status: Home / Self Care | Payer: Medicare Other | Source: Ambulatory Visit | Attending: Specialist | Admitting: Specialist

## 2020-09-03 ENCOUNTER — Other Ambulatory Visit: Payer: Self-pay | Admitting: Specialist

## 2020-09-03 DIAGNOSIS — R600 Localized edema: Secondary | ICD-10-CM

## 2020-09-10 ENCOUNTER — Other Ambulatory Visit: Payer: Self-pay

## 2020-09-10 ENCOUNTER — Ambulatory Visit
Admission: RE | Admit: 2020-09-10 | Discharge: 2020-09-10 | Disposition: A | Discharge status: Home / Self Care | Payer: Medicare Other | Source: Ambulatory Visit | Attending: Family Medicine | Admitting: Family Medicine

## 2020-09-10 DIAGNOSIS — R270 Ataxia, unspecified: Secondary | ICD-10-CM | POA: Insufficient documentation

## 2020-09-10 MED ORDER — GADOBUTROL 1 MMOL/ML IV SOLN
7.0000 mL | Freq: Once | INTRAVENOUS | Status: AC | PRN
  Administered 2020-09-10: 7 mL via INTRAVENOUS

## 2021-04-30 ENCOUNTER — Other Ambulatory Visit: Payer: Self-pay

## 2021-04-30 ENCOUNTER — Emergency Department
Admission: EM | Admit: 2021-04-30 | Discharge: 2021-04-30 | Disposition: A | Discharge status: Home / Self Care | Payer: Medicare Other | Attending: Emergency Medicine | Admitting: Emergency Medicine

## 2021-04-30 ENCOUNTER — Emergency Department: Payer: Medicare Other

## 2021-04-30 DIAGNOSIS — Z79899 Other long term (current) drug therapy: Secondary | ICD-10-CM | POA: Insufficient documentation

## 2021-04-30 DIAGNOSIS — Z85038 Personal history of other malignant neoplasm of large intestine: Secondary | ICD-10-CM | POA: Diagnosis not present

## 2021-04-30 DIAGNOSIS — I5032 Chronic diastolic (congestive) heart failure: Secondary | ICD-10-CM | POA: Insufficient documentation

## 2021-04-30 DIAGNOSIS — J449 Chronic obstructive pulmonary disease, unspecified: Secondary | ICD-10-CM | POA: Insufficient documentation

## 2021-04-30 DIAGNOSIS — Z7982 Long term (current) use of aspirin: Secondary | ICD-10-CM | POA: Insufficient documentation

## 2021-04-30 DIAGNOSIS — S60212A Contusion of left wrist, initial encounter: Secondary | ICD-10-CM | POA: Diagnosis not present

## 2021-04-30 DIAGNOSIS — I110 Hypertensive heart disease with heart failure: Secondary | ICD-10-CM | POA: Diagnosis not present

## 2021-04-30 DIAGNOSIS — Z96642 Presence of left artificial hip joint: Secondary | ICD-10-CM | POA: Diagnosis not present

## 2021-04-30 DIAGNOSIS — I2510 Atherosclerotic heart disease of native coronary artery without angina pectoris: Secondary | ICD-10-CM | POA: Insufficient documentation

## 2021-04-30 DIAGNOSIS — E119 Type 2 diabetes mellitus without complications: Secondary | ICD-10-CM | POA: Diagnosis not present

## 2021-04-30 DIAGNOSIS — Y9301 Activity, walking, marching and hiking: Secondary | ICD-10-CM | POA: Insufficient documentation

## 2021-04-30 DIAGNOSIS — W228XXA Striking against or struck by other objects, initial encounter: Secondary | ICD-10-CM | POA: Insufficient documentation

## 2021-04-30 DIAGNOSIS — Z87891 Personal history of nicotine dependence: Secondary | ICD-10-CM | POA: Insufficient documentation

## 2021-04-30 DIAGNOSIS — S6992XA Unspecified injury of left wrist, hand and finger(s), initial encounter: Secondary | ICD-10-CM | POA: Diagnosis present

## 2021-04-30 MED ORDER — HYDROCODONE-ACETAMINOPHEN 5-325 MG PO TABS
1.0000 | ORAL_TABLET | Freq: Once | ORAL | Status: AC
  Administered 2021-04-30: 1 via ORAL
  Filled 2021-04-30: qty 1

## 2021-04-30 MED ORDER — HYDROCODONE-ACETAMINOPHEN 5-325 MG PO TABS: 1.0000 | ORAL_TABLET | ORAL | 0 refills | Status: AC | PRN

## 2021-04-30 NOTE — ED Triage Notes (Signed)
Pt to ER via POV with complaints of left wrist pain, states pain radiates into shoulder. Reports hitting a wall today with her arm.  Obvious swelling present.

## 2021-04-30 NOTE — ED Notes (Signed)
See triage note  presents with pain to left wrist area  states she hit a wall   swelling noted  good pulses

## 2021-04-30 NOTE — ED Notes (Signed)
Left Velcro wrist splint applied.

## 2021-04-30 NOTE — ED Provider Notes (Signed)
[facility] Emergency Department Provider Note  ____________________________________________  Time seen: Approximately 7:00 PM  I have reviewed the triage vital signs and the nursing notes.   HISTORY  Chief Complaint Wrist Pain    HPI Penny Hart is a 75 y.o. female who presents the emergency department for evaluation of a left wrist injury.  Patient accidentally struck her arm against a door jam as she was walking earlier today.  She did not fall but did develop pain and swelling to the wrist itself.  Patient states that initially she thought it would settle down as the pain had continued as well as the swelling she presents for evaluation.  She denied any injury to the forearm, elbow or shoulder joint.  No loss of sensation.  Medical history as described below with no complaints of chronic medical issues.       Past Medical History:  Diagnosis Date   Arthritis    CHF (congestive heart failure) (HCC)    COPD (chronic obstructive pulmonary disease) (HCC)    Coronary artery disease    Diabetes mellitus without complication (HCC)    GERD (gastroesophageal reflux disease)    Glaucoma    Headache    Hiatal hernia    Hyperlipidemia    Hypertension    Neuropathy    Obesity    Peripheral neuropathy    Pseudophakia    Sleep apnea    Spinal stenosis    Spondylolisthesis of lumbar region    Thoracic aortic atherosclerosis [facility])     Patient Active Problem List   Diagnosis Date Noted   Surgery, elective    Chronic diastolic congestive heart failure (HCC)    Chronic obstructive pulmonary disease (HCC)    Diabetes mellitus type 2 in nonobese (HCC)    Benign essential HTN    Post-operative pain    Hyponatremia    Leukocytosis    Spondylolisthesis of lumbar region 04/27/2017   Blood in stool    Hiatal hernia    Benign neoplasm of cecum    Benign neoplasm of transverse colon    Diverticulosis of large intestine without diverticulitis    Rectal  bleeding 05/29/2015   Non-intractable cyclical vomiting with nausea     Past Surgical History:  Procedure Laterality Date   ABDOMINAL HYSTERECTOMY     BACK SURGERY  2019   [facility] [place]   BREAST EXCISIONAL BIOPSY Left yrs ago    benign   CATARACT EXTRACTION W/ INTRAOCULAR LENS  IMPLANT, BILATERAL     COLONOSCOPY N/A 06/01/2020   Procedure: COLONOSCOPY;  Surgeon: Lesly Rubenstein, MD;  Location: [facility] ENDOSCOPY;  Service: Endoscopy;  Laterality: N/A;   COLONOSCOPY WITH PROPOFOL N/A 05/30/2015   Procedure: COLONOSCOPY WITH PROPOFOL;  Surgeon: Lucilla Lame, MD;  Location: [facility] ENDOSCOPY;  Service: Endoscopy;  Laterality: N/A;   ESOPHAGOGASTRODUODENOSCOPY N/A 06/01/2020   Procedure: ESOPHAGOGASTRODUODENOSCOPY (EGD);  Surgeon: Lesly Rubenstein, MD;  Location: [facility] ENDOSCOPY;  Service: Endoscopy;  Laterality: N/A;   ESOPHAGOGASTRODUODENOSCOPY (EGD) WITH PROPOFOL N/A 05/30/2015   Procedure: ESOPHAGOGASTRODUODENOSCOPY (EGD) WITH PROPOFOL;  Surgeon: Lucilla Lame, MD;  Location: [facility] ENDOSCOPY;  Service: Endoscopy;  Laterality: N/A;   EYE SURGERY     Bialteral for glaucoma   Eye surgery for glaucoma     JOINT REPLACEMENT     Left   LEFT HEART CATH AND CORONARY ANGIOGRAPHY Left 01/30/2020   Procedure: LEFT HEART CATH AND CORONARY ANGIOGRAPHY;  Surgeon: Yolonda Kida, MD;  Location: [facility];  Service: Cardiovascular;  Laterality: Left;   Left total knee replacement     OOPHORECTOMY     PELVIC LAPAROSCOPY     POSTERIOR LUMBAR FUSION  04/27/2017   TUBAL LIGATION      Prior to Admission medications   Medication Sig Start Date End Date Taking? Authorizing Provider  HYDROcodone-acetaminophen (NORCO/VICODIN) 5-325 MG tablet Take 1 tablet by mouth every 4 (four) hours as needed for moderate pain. 04/30/21 04/30/22 Yes Annelise Mccoy, Charline Bills, PA-C  amLODipine (NORVASC) 5 MG tablet Take 5 mg by mouth daily.     [provider]  aspirin EC 81 MG tablet Take 81 mg by mouth  daily. Swallow whole.    [provider]  esomeprazole (NEXIUM) 40 MG capsule Take 40 mg by mouth daily at 12 noon.    [provider]  furosemide (LASIX) 20 MG tablet Take 1 tablet by mouth daily.    [provider]  gabapentin (NEURONTIN) 100 MG capsule Take 200 mg by mouth 3 (three) times daily.     [provider]  mirtazapine (REMERON) 7.5 MG tablet Take 7.5 mg by mouth at bedtime.    [provider]  ondansetron (ZOFRAN ODT) 4 MG disintegrating tablet Take 1 tablet (4 mg total) by mouth every 8 (eight) hours as needed for nausea or vomiting. 08/17/19   Carrie Mew, MD  pravastatin (PRAVACHOL) 10 MG tablet Take 1 tablet by mouth every evening.    [provider]  prednisoLONE acetate (PRED FORTE) 1 % ophthalmic suspension 1 drop 4 (four) times daily.    [provider]    Allergies Oxycodone-acetaminophen and Tramadol  Family History  Problem Relation Age of Onset   Diabetes Mother     Social History Social History   Tobacco Use   Smoking status: Former   Smokeless tobacco: Never  Scientific laboratory technician Use: Never used  Substance Use Topics   Alcohol use: No   Drug use: No     Review of Systems  Constitutional: No fever/chills Eyes: No visual changes. No discharge ENT: No upper respiratory complaints. Cardiovascular: no chest pain. Respiratory: no cough. No SOB. Gastrointestinal: No abdominal pain.  No nausea, no vomiting.  No diarrhea.  No constipation. Musculoskeletal: Positive for left wrist injury Skin: Negative for rash, abrasions, lacerations, ecchymosis. Neurological: Negative for headaches, focal weakness or numbness.  10 System ROS otherwise negative.  ____________________________________________   PHYSICAL EXAM:  VITAL SIGNS: ED Triage Vitals  Enc Vitals Group     BP 04/30/21 1813 (!) 135/93     Pulse Rate 04/30/21 1813 (!) 56     Resp 04/30/21 1813 18     Temp 04/30/21 1813 97.8 F  (36.6 C)     Temp Source 04/30/21 1813 Oral     SpO2 04/30/21 1813 97 %     Weight 04/30/21 1826 174 lb 2.6 oz (79 kg)     Height 04/30/21 1813 5\' 6"  (1.676 m)     Head Circumference --      Peak Flow --      Pain Score 04/30/21 1813 7     Pain Loc --      Pain Edu? --      Excl. in [place]? --      Constitutional: Alert and oriented. Well appearing and in no acute distress. Eyes: Conjunctivae are normal. PERRL. EOMI. Head: Atraumatic. ENT:      Ears:       Nose: No congestion/rhinnorhea.  Mouth/Throat: Mucous membranes are moist.  Neck: No stridor.    Cardiovascular: Normal rate, regular rhythm. Normal S1 and S2.  Good peripheral circulation. Respiratory: Normal respiratory effort without tachypnea or retractions. Lungs CTAB. Good air entry to the bases with no decreased or absent breath sounds. Musculoskeletal: Full range of motion to all extremities. No gross deformities appreciated.  Visualization of the left wrist reveals edema about the left wrist into the proximal hand.  Limited range of motion of the wrist but good range of motion to the shoulder, elbow and all digits.  No loss of sensation to the digits.  Capillary refill intact all digits.  Patient is primarily tender over the distal radius and ulna. Neurologic:  Normal speech and language. No gross focal neurologic deficits are appreciated.  Skin:  Skin is warm, dry and intact. No rash noted. Psychiatric: Mood and affect are normal. Speech and behavior are normal. Patient exhibits appropriate insight and judgement.   ____________________________________________   LABS (all labs ordered are listed, but only abnormal results are displayed)  Labs Reviewed - No data to display ____________________________________________  EKG   ____________________________________________  RADIOLOGY I personally viewed and evaluated these images as part of my medical decision making, as well as reviewing the written report by the  radiologist.  ED Provider Interpretation: No acute fracture identified  DG Wrist Complete Left  Result Date: 04/30/2021 CLINICAL DATA:  LEFT wrist swelling EXAM: LEFT WRIST - COMPLETE 3+ VIEW COMPARISON:  None FINDINGS: Osseous mineralization normal. Old ossicle at tip of ulnar styloid process. Mild degenerative changes at first [facility] joint and STT joint. No acute fracture, dislocation, or bone destruction. Soft tissue swelling particularly at dorsum of LEFT wrist. IMPRESSION: Degenerative changes at radial border of LEFT carpus. Soft tissue swelling without acute osseous findings. Electronically Signed   By: Lavonia Dana M.D.   On: 04/30/2021 18:43    ____________________________________________    PROCEDURES  Procedure(s) performed:    Procedures    Medications  HYDROcodone-acetaminophen (NORCO/VICODIN) 5-325 MG per tablet 1 tablet (has no administration in time range)     ____________________________________________   INITIAL IMPRESSION / ASSESSMENT AND PLAN / ED COURSE  Pertinent labs & imaging results that were available during my care of the patient were reviewed by me and considered in my medical decision making (see chart for details).  Review of the [place] CSRS was performed in accordance of the [place] prior to dispensing any controlled drugs.           Patient's diagnosis is consistent with wrist contusion.  Patient presents emergency department complaining of left wrist pain after she accidentally smacked it against the door jam while walking.  Edema was noted.  Tender over the distal radius and ulna but x-ray is reassuring.  Patient will have Velcro wrist splint for symptom improvement.  Short prescription of pain medication will also be provided for the patient.  Follow-up with primary care or orthopedics as needed..  Patient is given ED precautions to return to the ED for any worsening or new symptoms.     ____________________________________________  FINAL  CLINICAL IMPRESSION(S) / ED DIAGNOSES  Final diagnoses:  Contusion of left wrist, initial encounter      NEW MEDICATIONS STARTED DURING THIS VISIT:  ED Discharge Orders          Ordered    HYDROcodone-acetaminophen (NORCO/VICODIN) 5-325 MG tablet  Every 4 hours PRN        04/30/21 1912  This chart was dictated using voice recognition software/Dragon. Despite best efforts to proofread, errors can occur which can change the meaning. Any change was purely unintentional.    Darletta Moll, PA-C 04/30/21 1912    Nance Pear, MD 04/30/21 [phone]

## 2021-09-30 ENCOUNTER — Emergency Department
Admission: EM | Admit: 2021-09-30 | Discharge: 2021-09-30 | Disposition: A | Discharge status: Home / Self Care | Payer: Medicare Other | Attending: Emergency Medicine | Admitting: Emergency Medicine

## 2021-09-30 ENCOUNTER — Other Ambulatory Visit: Payer: Self-pay

## 2021-09-30 ENCOUNTER — Emergency Department: Payer: Medicare Other

## 2021-09-30 ENCOUNTER — Encounter: Payer: Self-pay | Admitting: Emergency Medicine

## 2021-09-30 DIAGNOSIS — M25561 Pain in right knee: Secondary | ICD-10-CM | POA: Insufficient documentation

## 2021-09-30 DIAGNOSIS — Z20822 Contact with and (suspected) exposure to covid-19: Secondary | ICD-10-CM | POA: Insufficient documentation

## 2021-09-30 DIAGNOSIS — X501XXA Overexertion from prolonged static or awkward postures, initial encounter: Secondary | ICD-10-CM | POA: Diagnosis not present

## 2021-09-30 DIAGNOSIS — F0390 Unspecified dementia without behavioral disturbance: Secondary | ICD-10-CM | POA: Insufficient documentation

## 2021-09-30 DIAGNOSIS — G8911 Acute pain due to trauma: Secondary | ICD-10-CM | POA: Diagnosis not present

## 2021-09-30 LAB — RESP PANEL BY RT-PCR (FLU A&B, COVID) ARPGX2
Influenza A by PCR: NEGATIVE
Influenza B by PCR: NEGATIVE
SARS Coronavirus 2 by RT PCR: NEGATIVE

## 2021-09-30 NOTE — ED Notes (Signed)
Spoke with pt son. Made son aware Pt will need a safe means of discharge.  ?

## 2021-09-30 NOTE — ED Notes (Addendum)
Pt states she was arguing with her son and twisted and had severe knee pain in right knee  ?after  ? ?

## 2021-09-30 NOTE — ED Triage Notes (Signed)
Pt via EMS from home. Pt c/o R knee pain. Pt states she "stepped wrong".  ?Pt is A&OX4 and NAD ?Per EMS, son was stating that they were going to refuse to pick her up and that she needed to be placed somewhere. Per pt, son and daughter always telling her she has dementia and per pt she has never been dx with it, no physician has as told her that. Pt states she ambulates with a cane. Pt states "every time they don't agree with me they throw the word dementia at me" and that her son and daughter states they are tired of taking care of her. Pt states she does feel safe at home and denies any physical abuse. Pt tearful in triage speaking about situation.  ?

## 2021-09-30 NOTE — Discharge Instructions (Signed)
As we discussed please talk to her primary care doctor about gaining other resources to help her at home. Please seek medical attention for any high fevers, chest pain, shortness of breath, change in behavior, persistent vomiting, bloody stool or any other new or concerning symptoms. ? ?

## 2021-09-30 NOTE — ED Notes (Signed)
Daughter at bedside at this time. DC paperwork given to daughter.  ?

## 2021-09-30 NOTE — ED Provider Notes (Addendum)
? ?[facility] ?Provider Note ? ? ? Event Date/Time  ? First MD Initiated Contact with Patient 09/30/21 1853   ?  (approximate) ? ? ?History  ? ?Knee Pain ? ? ?HPI ? ?Penny Hart is a 76 y.o. female  who per PCP note dated 06/29/2021 has history of dementia, who presents to the emergency department today because of concern for right knee pain.  Unfortunately patient does have dementia so cannot give a good history as to what happened.  Every time I tried to ask her how she hurt her knee she simply talked about how upsetting family was currently.  Eventually the patient stated that she twisted her knee but how this occurred or the mechanism of this event I could never elucidate.  Patient denies pain anywhere else. ? ? ?Physical Exam  ? ?Triage Vital Signs: ?ED Triage Vitals  ?Enc Vitals Group  ?   BP 09/30/21 1813 137/86  ?   Pulse Rate 09/30/21 1813 64  ?   Resp 09/30/21 1813 18  ?   Temp 09/30/21 1813 98.8 ?F (37.1 ?C)  ?   Temp Source 09/30/21 1813 Oral  ?   SpO2 09/30/21 1813 95 %  ?   Weight 09/30/21 1818 170 lb (77.1 kg)  ?   Height 09/30/21 1814 '5\' 6"'$  (1.676 m)  ?   Head Circumference --   ?   Peak Flow --   ?   Pain Score 09/30/21 1814 0  ? ?Most recent vital signs: ?Vitals:  ? 09/30/21 1813  ?BP: 137/86  ?Pulse: 64  ?Resp: 18  ?Temp: 98.8 ?F (37.1 ?C)  ?SpO2: 95%  ? ? ?General: Awake, no distress. Not oriented.  ?CV:  Good peripheral perfusion. No murmurs.  ?Resp:  Normal effort. No shortness of breath. ?Abd:  No distention. Non tender.  ? ? ?ED Results / Procedures / Treatments  ? ?Labs ?(all labs ordered are listed, but only abnormal results are displayed) ?Labs Reviewed  ?RESP PANEL BY RT-PCR (FLU A&B, COVID) ARPGX2  ? ? ? ?EKG ? ?None ? ? ?RADIOLOGY ?I independently interpreted and visualized the right knee x-ray. My interpretation: No acute abnormality ?Radiology interpretation:  ?IMPRESSION:  ?1. Severe tricompartmental degenerative changes.  ?2. Very small joint effusion.  ?    ? ? ?PROCEDURES: ? ?Critical Care performed: No ? ?Procedures ? ? ?MEDICATIONS ORDERED IN ED: ?Medications - No data to display ? ? ?IMPRESSION / MDM / ASSESSMENT AND PLAN / ED COURSE  ?I reviewed the triage vital signs and the nursing notes. ?             ?               ? ?Differential diagnosis includes, but is not limited to, fracture, dislocation, arthritis, soft tissue injury. ? ?Patient presented to the emergency department today with primary complaint of right knee pain.  On exam the knee is without any warmth or erythema.  No significant effusion.  Chest x-ray without any acute fracture or dislocation.  Patient however is fairly disoriented at this time.  There is some family concerned about safety at home.  I think it would be reasonable for patient to be evaluated by social work in the morning. ? ? ?I did have a discussion with the son over the telephone.  Currently he has no concern for the patient's safety.  He states that she has not fallen recently.  He would like to have patient be  discharged home.  I did discuss with patient that he can contact primary care for further outpatient resources.  He was amenable to this plan.   ?FINAL CLINICAL IMPRESSION(S) / ED DIAGNOSES  ? ?Final diagnoses:  ?Dementia, unspecified dementia severity, unspecified dementia type, unspecified whether behavioral, psychotic, or mood disturbance or anxiety ([place])  ?Acute pain of right knee  ? ? ? ? ? ?Note:  This document was prepared using Dragon voice recognition software and may include unintentional dictation errors. ? ?  ?Nance Pear, MD ?09/30/21 2030 ? ?  ?Nance Pear, MD ?09/30/21 2302 ? ?

## 2021-10-20 ENCOUNTER — Other Ambulatory Visit: Payer: Self-pay | Admitting: Student

## 2021-10-20 ENCOUNTER — Other Ambulatory Visit (HOSPITAL_COMMUNITY): Payer: Self-pay | Admitting: Student

## 2021-10-20 DIAGNOSIS — F0280 Dementia in other diseases classified elsewhere without behavioral disturbance: Secondary | ICD-10-CM

## 2021-11-03 ENCOUNTER — Encounter (HOSPITAL_COMMUNITY): Payer: Self-pay

## 2021-11-03 ENCOUNTER — Ambulatory Visit (HOSPITAL_COMMUNITY): Payer: Medicare Other | Attending: Student

## 2021-12-19 ENCOUNTER — Encounter: Payer: Self-pay | Admitting: Intensive Care

## 2021-12-19 ENCOUNTER — Emergency Department
Admission: EM | Admit: 2021-12-19 | Discharge: 2021-12-19 | Disposition: A | Discharge status: Home / Self Care | Payer: Medicare Other | Attending: Emergency Medicine | Admitting: Emergency Medicine

## 2021-12-19 ENCOUNTER — Other Ambulatory Visit: Payer: Self-pay

## 2021-12-19 ENCOUNTER — Emergency Department: Payer: Medicare Other

## 2021-12-19 DIAGNOSIS — R079 Chest pain, unspecified: Secondary | ICD-10-CM | POA: Insufficient documentation

## 2021-12-19 DIAGNOSIS — J449 Chronic obstructive pulmonary disease, unspecified: Secondary | ICD-10-CM | POA: Insufficient documentation

## 2021-12-19 DIAGNOSIS — I509 Heart failure, unspecified: Secondary | ICD-10-CM | POA: Diagnosis not present

## 2021-12-19 DIAGNOSIS — M25512 Pain in left shoulder: Secondary | ICD-10-CM | POA: Insufficient documentation

## 2021-12-19 DIAGNOSIS — I110 Hypertensive heart disease with heart failure: Secondary | ICD-10-CM | POA: Diagnosis not present

## 2021-12-19 LAB — BASIC METABOLIC PANEL
Anion gap: 6 (ref 5–15)
BUN: 8 mg/dL (ref 8–23)
CO2: 25 mmol/L (ref 22–32)
Calcium: 9.1 mg/dL (ref 8.9–10.3)
Chloride: 104 mmol/L (ref 98–111)
Creatinine, Ser: 0.84 mg/dL (ref 0.44–1.00)
GFR, Estimated: >60 mL/min (ref >60)
Glucose, Bld: 104 mg/dL — ABNORMAL HIGH (ref 70–99)
Potassium: 3.6 mmol/L (ref 3.5–5.1)
Sodium: 135 mmol/L (ref 135–145)

## 2021-12-19 LAB — CBC
HCT: 43.9 % (ref 36.0–46.0)
Hemoglobin: 13.7 g/dL (ref 12.0–15.0)
MCH: 27.8 pg (ref 26.0–34.0)
MCHC: 31.2 g/dL (ref 30.0–36.0)
MCV: 89 fL (ref 80.0–100.0)
Platelets: 280 10*3/uL (ref 150–400)
RBC: 4.93 MIL/uL (ref 3.87–5.11)
RDW: 14.5 % (ref 11.5–15.5)
WBC: 7.5 10*3/uL (ref 4.0–10.5)
nRBC: 0 % (ref 0.0–0.2)

## 2021-12-19 LAB — TROPONIN I (HIGH SENSITIVITY): Troponin I (High Sensitivity): 4 ng/L (ref <18)

## 2021-12-19 NOTE — ED Triage Notes (Signed)
Pt c/o aching/sharp chest pain that radiates down right arm since last night. Reports flutters in chest for years.

## 2021-12-19 NOTE — ED Provider Notes (Signed)
[facility] Provider Note    Event Date/Time   First MD Initiated Contact with Patient 12/19/21 1827     (approximate)  History   Chief Complaint: Chest Pain  HPI  Penny Hart is a 76 y.o. female with a past medical history of COPD, CHF, diabetes, gastric reflux, hypertension, hyperlipidemia presents to the emergency department for chest pain.  According to the patient for many months now she has been experiencing "aching" in her left chest.  Patient states after sleeping last night she awoke with a similar aching but also experienced pain into her left shoulder as well which is not typical.  Patient states she was concerned as she continued to have some discomfort this afternoon so she came to the emergency department for evaluation.  Patient denies any nausea diaphoresis or shortness of breath.  Patient follows up with Dr. Clayborn Bigness.  Physical Exam   Triage Vital Signs: ED Triage Vitals  Enc Vitals Group     BP 12/19/21 1710 (!) 147/88     Pulse Rate 12/19/21 1710 63     Resp 12/19/21 1710 18     Temp 12/19/21 1710 98.4 F (36.9 C)     Temp Source 12/19/21 1710 Oral     SpO2 12/19/21 1710 95 %     Weight 12/19/21 1712 170 lb (77.1 kg)     Height 12/19/21 1712 '5\' 5"'$  (1.651 m)     Head Circumference --      Peak Flow --      Pain Score 12/19/21 1711 2     Pain Loc --      Pain Edu? --      Excl. in [place]? --     Most recent vital signs: Vitals:   12/19/21 1710  BP: (!) 147/88  Pulse: 63  Resp: 18  Temp: 98.4 F (36.9 C)  SpO2: 95%    General: Awake, no distress.  CV:  Good peripheral perfusion.  Regular rate and rhythm  Resp:  Normal effort.  Equal breath sounds bilaterally.  Abd:  No distention.  Soft, nontender.  No rebound or guarding. Other:  Mild tenderness to palpation of left shoulder with pain with range of motion.  Patient has equal grip strength in bilateral upper extremities with no pronator drift.  Sensations intact and  equal.   ED Results / Procedures / Treatments   EKG  EKG viewed and interpreted by myself shows a normal sinus rhythm at 66 bpm with a narrow QRS, normal axis, normal intervals, nonspecific but no concerning ST changes.  RADIOLOGY  I personally interpreted the x-ray images no significant abnormality seen on my evaluation. Radiology is read the chest x-ray is negative besides chronic interstitial lung disease.   MEDICATIONS ORDERED IN ED: Medications - No data to display   IMPRESSION / MDM / [place] / ED COURSE  I reviewed the triage vital signs and the nursing notes.  Patient presents emergency department for left chest pain radiating into the left shoulder.  Patient states a history of many months or longer of left chest pain/aching.  Has followed up with her heart doctor for the same.  Patient states this morning it appeared to be into the left shoulder which concerned her.  Patient does have some tenderness to the left shoulder as well as pain with range of motion of the left shoulder possibly indicating more musculoskeletal discomfort.  Reassuringly patient's work-up today shows normal chest x-ray, reassuring EKG.  Normal CBC.  Normal chemistry and a negative troponin.  Given the patient's age and chest pain there was concern for possible hospital admission however given the patient's reassuring work-up including negative troponin and reassuring EKG and chest x-ray I believe the patient would be safe for discharge home with cardiology follow-up.  Patient agreeable to plan of care.  FINAL CLINICAL IMPRESSION(S) / ED DIAGNOSES   Chest pain  Rx / DC Orders   Cardiology follow-up  Note:  This document was prepared using Dragon voice recognition software and may include unintentional dictation errors.   Harvest Dark, MD 12/19/21 1850

## 2021-12-19 NOTE — ED Notes (Signed)
Pt A&O, discharge instructions given, pt in stable condition.

## 2022-03-09 ENCOUNTER — Other Ambulatory Visit (HOSPITAL_COMMUNITY): Payer: Self-pay | Admitting: Student

## 2022-03-09 DIAGNOSIS — F0280 Dementia in other diseases classified elsewhere without behavioral disturbance: Secondary | ICD-10-CM

## 2022-04-11 ENCOUNTER — Ambulatory Visit (HOSPITAL_COMMUNITY)
Admission: RE | Admit: 2022-04-11 | Discharge: 2022-04-11 | Disposition: A | Discharge status: Home / Self Care | Payer: Medicare Other | Source: Ambulatory Visit | Attending: Student | Admitting: Student

## 2022-04-11 DIAGNOSIS — F0280 Dementia in other diseases classified elsewhere without behavioral disturbance: Secondary | ICD-10-CM | POA: Diagnosis present

## 2022-04-11 DIAGNOSIS — F0150 Vascular dementia without behavioral disturbance: Secondary | ICD-10-CM | POA: Diagnosis present

## 2022-04-11 DIAGNOSIS — G309 Alzheimer's disease, unspecified: Secondary | ICD-10-CM | POA: Diagnosis present

## 2022-08-17 DIAGNOSIS — G309 Alzheimer's disease, unspecified: Secondary | ICD-10-CM | POA: Diagnosis not present

## 2022-08-17 DIAGNOSIS — R2689 Other abnormalities of gait and mobility: Secondary | ICD-10-CM | POA: Diagnosis not present

## 2022-08-17 DIAGNOSIS — F0150 Vascular dementia without behavioral disturbance: Secondary | ICD-10-CM | POA: Diagnosis not present

## 2022-08-17 DIAGNOSIS — Z79899 Other long term (current) drug therapy: Secondary | ICD-10-CM | POA: Diagnosis not present

## 2022-08-17 DIAGNOSIS — G44309 Post-traumatic headache, unspecified, not intractable: Secondary | ICD-10-CM | POA: Diagnosis not present

## 2022-08-17 DIAGNOSIS — E1169 Type 2 diabetes mellitus with other specified complication: Secondary | ICD-10-CM | POA: Diagnosis not present

## 2022-08-17 DIAGNOSIS — Z6835 Body mass index (BMI) 35.0-35.9, adult: Secondary | ICD-10-CM | POA: Diagnosis not present

## 2022-08-17 DIAGNOSIS — E785 Hyperlipidemia, unspecified: Secondary | ICD-10-CM | POA: Diagnosis not present

## 2022-10-27 DIAGNOSIS — I10 Essential (primary) hypertension: Secondary | ICD-10-CM | POA: Diagnosis not present

## 2022-10-27 DIAGNOSIS — E1140 Type 2 diabetes mellitus with diabetic neuropathy, unspecified: Secondary | ICD-10-CM | POA: Diagnosis not present

## 2022-10-27 DIAGNOSIS — E1169 Type 2 diabetes mellitus with other specified complication: Secondary | ICD-10-CM | POA: Diagnosis not present

## 2022-10-27 DIAGNOSIS — E785 Hyperlipidemia, unspecified: Secondary | ICD-10-CM | POA: Diagnosis not present

## 2022-11-01 DIAGNOSIS — M25561 Pain in right knee: Secondary | ICD-10-CM | POA: Diagnosis not present

## 2022-11-01 DIAGNOSIS — G309 Alzheimer's disease, unspecified: Secondary | ICD-10-CM | POA: Diagnosis not present

## 2022-11-01 DIAGNOSIS — F0280 Dementia in other diseases classified elsewhere without behavioral disturbance: Secondary | ICD-10-CM | POA: Diagnosis not present

## 2022-11-01 DIAGNOSIS — F0150 Vascular dementia without behavioral disturbance: Secondary | ICD-10-CM | POA: Diagnosis not present

## 2022-11-01 DIAGNOSIS — Z79899 Other long term (current) drug therapy: Secondary | ICD-10-CM | POA: Diagnosis not present

## 2022-11-01 DIAGNOSIS — F919 Conduct disorder, unspecified: Secondary | ICD-10-CM | POA: Diagnosis not present

## 2022-12-11 ENCOUNTER — Emergency Department: Payer: Medicare HMO

## 2022-12-11 ENCOUNTER — Other Ambulatory Visit: Payer: Self-pay

## 2022-12-11 ENCOUNTER — Emergency Department
Admission: EM | Admit: 2022-12-11 | Discharge: 2022-12-11 | Disposition: A | Discharge status: Home / Self Care | Payer: Medicare HMO | Attending: Emergency Medicine | Admitting: Emergency Medicine

## 2022-12-11 ENCOUNTER — Encounter: Payer: Self-pay | Admitting: Emergency Medicine

## 2022-12-11 DIAGNOSIS — G309 Alzheimer's disease, unspecified: Secondary | ICD-10-CM | POA: Diagnosis not present

## 2022-12-11 DIAGNOSIS — F0280 Dementia in other diseases classified elsewhere without behavioral disturbance: Secondary | ICD-10-CM | POA: Diagnosis not present

## 2022-12-11 DIAGNOSIS — J029 Acute pharyngitis, unspecified: Secondary | ICD-10-CM | POA: Diagnosis present

## 2022-12-11 DIAGNOSIS — R0789 Other chest pain: Secondary | ICD-10-CM | POA: Diagnosis not present

## 2022-12-11 DIAGNOSIS — J020 Streptococcal pharyngitis: Secondary | ICD-10-CM | POA: Diagnosis not present

## 2022-12-11 DIAGNOSIS — R079 Chest pain, unspecified: Secondary | ICD-10-CM | POA: Diagnosis not present

## 2022-12-11 LAB — TROPONIN I (HIGH SENSITIVITY)
Troponin I (High Sensitivity): 3 ng/L (ref <18)
Troponin I (High Sensitivity): 4 ng/L (ref <18)

## 2022-12-11 LAB — CBC
HCT: 43.3 % (ref 36.0–46.0)
Hemoglobin: 13.6 g/dL (ref 12.0–15.0)
MCH: 28 pg (ref 26.0–34.0)
MCHC: 31.4 g/dL (ref 30.0–36.0)
MCV: 89.3 fL (ref 80.0–100.0)
Platelets: 243 10*3/uL (ref 150–400)
RBC: 4.85 MIL/uL (ref 3.87–5.11)
RDW: 14.1 % (ref 11.5–15.5)
WBC: 6 10*3/uL (ref 4.0–10.5)
nRBC: 0 % (ref 0.0–0.2)

## 2022-12-11 LAB — GROUP A STREP BY PCR: Group A Strep by PCR: DETECTED — AB

## 2022-12-11 LAB — BASIC METABOLIC PANEL
Anion gap: 9 (ref 5–15)
BUN: 8 mg/dL (ref 8–23)
CO2: 24 mmol/L (ref 22–32)
Calcium: 8.9 mg/dL (ref 8.9–10.3)
Chloride: 104 mmol/L (ref 98–111)
Creatinine, Ser: 0.81 mg/dL (ref 0.44–1.00)
GFR, Estimated: >60 mL/min (ref >60)
Glucose, Bld: 104 mg/dL — ABNORMAL HIGH (ref 70–99)
Potassium: 3.7 mmol/L (ref 3.5–5.1)
Sodium: 137 mmol/L (ref 135–145)

## 2022-12-11 MED ORDER — AMOXICILLIN 875 MG PO TABS: 875.0000 mg | ORAL_TABLET | Freq: Two times a day (BID) | ORAL | 0 refills | Status: AC

## 2022-12-11 NOTE — Discharge Instructions (Addendum)
She was positive for strep throat we have started her on antibiotics.  For her chest pain please call her cardiologist to make a follow-up appointment but there is no evidence of heart attack today.  Return to the ER if she develops worsening symptoms, unable to tolerate eating or any other concerns

## 2022-12-11 NOTE — ED Provider Notes (Signed)
[facility] Provider Note    Event Date/Time   First MD Initiated Contact with Patient 12/11/22 1430     (approximate)   History   Chest Pain and Sore Throat   HPI  Penny Hart is a 77 y.o. female with history of dementia, Alzheimer's she comes in with concern for sore throat.  Patient reports some intermittent chest pain.  Is unclear when this chest pain last started.  She states that she has it every day.  However today she reports more of a sore throat that started this morning denies any known fevers.  She is present with her son who denies any recent falls.  She does have a history of dementia per the son and is acting at her normal self.  Physical Exam   Triage Vital Signs: ED Triage Vitals  Enc Vitals Group     BP 12/11/22 1129 130/70     Pulse Rate 12/11/22 1129 (!) 56     Resp 12/11/22 1129 16     Temp 12/11/22 1129 98.3 F (36.8 C)     Temp Source 12/11/22 1129 Oral     SpO2 12/11/22 1129 95 %     Weight 12/11/22 1130 169 lb 12.1 oz (77 kg)     Height 12/11/22 1130 5\' 5"  (1.651 m)     Head Circumference --      Peak Flow --      Pain Score 12/11/22 1130 6     Pain Loc --      Pain Edu? --      Excl. in [place]? --     Most recent vital signs: Vitals:   12/11/22 1129 12/11/22 1444  BP: 130/70 130/72  Pulse: (!) 56 60  Resp: 16 16  Temp: 98.3 F (36.8 C)   SpO2: 95% 96%     General: Awake, no distress.  CV:  Good peripheral perfusion.  Resp:  Normal effort.  Abd:  No distention.  Soft nontender Other:  OP is slightly red but no significant exudates.  Uvula midline.  Full range of motion of neck.  No swelling of the tonsils.   ED Results / Procedures / Treatments   Labs (all labs ordered are listed, but only abnormal results are displayed) Labs Reviewed  GROUP A STREP BY PCR - Abnormal; Notable for the following components:      Result Value   Group A Strep by PCR DETECTED (*)    All other components within normal  limits  BASIC METABOLIC PANEL - Abnormal; Notable for the following components:   Glucose, Bld 104 (*)    All other components within normal limits  CBC  TROPONIN I (HIGH SENSITIVITY)  TROPONIN I (HIGH SENSITIVITY)     EKG  My interpretation of EKG:  Normal sinus rate of 63 without any ST elevation, T wave inversion in lead V3.  Little bit of artifact but overall reassuring.  RADIOLOGY I have reviewed the xray personally and interpreted no pneumonia  PROCEDURES:  Critical Care performed: No  Procedures   MEDICATIONS ORDERED IN ED: Medications - No data to display   IMPRESSION / MDM / ASSESSMENT AND PLAN / ED COURSE  I reviewed the triage vital signs and the nursing notes.   Patient's presentation is most consistent with acute presentation with potential threat to life or bodily function.   Patient comes in with sore throat and chest pain however the chest pain has been going on for months.  I reviewed a visit from last year.  She had also complained of chest pain that have been going on for months and she follows with Dr. Juliann Pares.  Does not sound like this is an acute change today her EKG is reassuring but we will order 2 troponins given unclear exactly when it started just to ensure no signs of ACS however her pain she points to is more in her throat so I suspect this is more related to the sore throat.  On examination I do not see any signs for peritonsillar abscess, retropharyngeal abscess but her strep test was positive.  BMP was reassuring CBC reassuring  Cardiac markers are negative x 2.  Reevaluated patient now the niece is at bedside who also reports no concerns for falls and that she has a history of dementia but she is acting her normal baseline self.  They deny any other concerns.  She is aware that she is positive for strep and we will start her on antibiotics they expressed understanding and felt comfortable with discharge   FINAL CLINICAL IMPRESSION(S) / ED  DIAGNOSES   Final diagnoses:  Strep pharyngitis     Rx / DC Orders   ED Discharge Orders          Ordered    amoxicillin (AMOXIL) 875 MG tablet  2 times daily        12/11/22 1613             Note:  This document was prepared using Dragon voice recognition software and may include unintentional dictation errors.   Concha Se, MD 12/11/22 [phone]

## 2022-12-11 NOTE — ED Notes (Signed)
See triage note  Presents with sore throat which started this morning  Denies any fever  Also has been having intermittent chest pain  Is scheduled to see Dr Juliann Pares for same

## 2022-12-11 NOTE — ED Triage Notes (Signed)
Pt to ED via POV with son who states that pt has been having pain in her throat and her chest all night. Pt reports that it more pain in her throat than her chest. Per son, pt has dementia. Pt is in NAD.

## 2022-12-14 DIAGNOSIS — J449 Chronic obstructive pulmonary disease, unspecified: Secondary | ICD-10-CM | POA: Diagnosis not present

## 2022-12-14 DIAGNOSIS — R002 Palpitations: Secondary | ICD-10-CM | POA: Diagnosis not present

## 2022-12-14 DIAGNOSIS — G4730 Sleep apnea, unspecified: Secondary | ICD-10-CM | POA: Diagnosis not present

## 2022-12-14 DIAGNOSIS — E1140 Type 2 diabetes mellitus with diabetic neuropathy, unspecified: Secondary | ICD-10-CM | POA: Diagnosis not present

## 2022-12-14 DIAGNOSIS — I10 Essential (primary) hypertension: Secondary | ICD-10-CM | POA: Diagnosis not present

## 2022-12-14 DIAGNOSIS — R600 Localized edema: Secondary | ICD-10-CM | POA: Diagnosis not present

## 2022-12-14 DIAGNOSIS — E782 Mixed hyperlipidemia: Secondary | ICD-10-CM | POA: Diagnosis not present

## 2022-12-14 DIAGNOSIS — I2510 Atherosclerotic heart disease of native coronary artery without angina pectoris: Secondary | ICD-10-CM | POA: Diagnosis not present

## 2022-12-14 DIAGNOSIS — K219 Gastro-esophageal reflux disease without esophagitis: Secondary | ICD-10-CM | POA: Diagnosis not present

## 2022-12-21 ENCOUNTER — Telehealth: Payer: Self-pay

## 2022-12-21 NOTE — Telephone Encounter (Signed)
Transition Care Management Follow-up Telephone Call Date of discharge and from where: [place] 5/19 How have you been since you were released from the hospital? Doing fine Any questions or concerns? No  Items Reviewed: Did the pt receive and understand the discharge instructions provided? Yes  Medications obtained and verified? Yes  Other? No  Any new allergies since your discharge? No  Dietary orders reviewed? No Do you have support at home? Yes     Follow up appointments reviewed:  PCP Hospital f/u appt confirmed? Yes  Scheduled to see @ . CAN'T REMEMBER THE DATE Specialist Hospital f/u appt confirmed? No  Scheduled to see  on  @ . Are transportation arrangements needed? No  If their condition worsens, is the pt aware to call PCP or go to the Emergency Dept.? Yes Was the patient provided with contact information for the PCP's office or ED? Yes Was to pt encouraged to call back with questions or concerns? Yes

## 2023-02-07 ENCOUNTER — Other Ambulatory Visit: Payer: Self-pay

## 2023-02-07 ENCOUNTER — Emergency Department
Admission: EM | Admit: 2023-02-07 | Discharge: 2023-02-08 | Disposition: A | Discharge status: Home / Self Care | Payer: Medicare HMO | Attending: Emergency Medicine | Admitting: Emergency Medicine

## 2023-02-07 DIAGNOSIS — F0390 Unspecified dementia without behavioral disturbance: Secondary | ICD-10-CM | POA: Diagnosis not present

## 2023-02-07 DIAGNOSIS — I2510 Atherosclerotic heart disease of native coronary artery without angina pectoris: Secondary | ICD-10-CM | POA: Diagnosis not present

## 2023-02-07 DIAGNOSIS — F03918 Unspecified dementia, unspecified severity, with other behavioral disturbance: Secondary | ICD-10-CM | POA: Insufficient documentation

## 2023-02-07 DIAGNOSIS — E1140 Type 2 diabetes mellitus with diabetic neuropathy, unspecified: Secondary | ICD-10-CM | POA: Diagnosis not present

## 2023-02-07 DIAGNOSIS — I110 Hypertensive heart disease with heart failure: Secondary | ICD-10-CM | POA: Insufficient documentation

## 2023-02-07 DIAGNOSIS — Z6830 Body mass index (BMI) 30.0-30.9, adult: Secondary | ICD-10-CM | POA: Diagnosis not present

## 2023-02-07 DIAGNOSIS — R45850 Homicidal ideations: Secondary | ICD-10-CM | POA: Insufficient documentation

## 2023-02-07 DIAGNOSIS — I700 Atherosclerosis of aorta: Secondary | ICD-10-CM | POA: Diagnosis not present

## 2023-02-07 DIAGNOSIS — I25119 Atherosclerotic heart disease of native coronary artery with unspecified angina pectoris: Secondary | ICD-10-CM | POA: Diagnosis not present

## 2023-02-07 DIAGNOSIS — R6889 Other general symptoms and signs: Secondary | ICD-10-CM | POA: Diagnosis not present

## 2023-02-07 DIAGNOSIS — I509 Heart failure, unspecified: Secondary | ICD-10-CM | POA: Insufficient documentation

## 2023-02-07 DIAGNOSIS — J449 Chronic obstructive pulmonary disease, unspecified: Secondary | ICD-10-CM | POA: Insufficient documentation

## 2023-02-07 DIAGNOSIS — Z87891 Personal history of nicotine dependence: Secondary | ICD-10-CM | POA: Insufficient documentation

## 2023-02-07 LAB — COMPREHENSIVE METABOLIC PANEL
ALT: 13 U/L (ref 0–44)
AST: 19 U/L (ref 15–41)
Albumin: 3.9 g/dL (ref 3.5–5.0)
Alkaline Phosphatase: 74 U/L (ref 38–126)
Anion gap: 10 (ref 5–15)
BUN: 10 mg/dL (ref 8–23)
CO2: 23 mmol/L (ref 22–32)
Calcium: 9.3 mg/dL (ref 8.9–10.3)
Chloride: 105 mmol/L (ref 98–111)
Creatinine, Ser: 0.85 mg/dL (ref 0.44–1.00)
GFR, Estimated: >60 mL/min (ref >60)
Glucose, Bld: 103 mg/dL — ABNORMAL HIGH (ref 70–99)
Potassium: 3.7 mmol/L (ref 3.5–5.1)
Sodium: 138 mmol/L (ref 135–145)
Total Bilirubin: 0.7 mg/dL (ref 0.3–1.2)
Total Protein: 7.9 g/dL (ref 6.5–8.1)

## 2023-02-07 LAB — CBC
HCT: 42.7 % (ref 36.0–46.0)
Hemoglobin: 13.4 g/dL (ref 12.0–15.0)
MCH: 27.9 pg (ref 26.0–34.0)
MCHC: 31.4 g/dL (ref 30.0–36.0)
MCV: 88.8 fL (ref 80.0–100.0)
Platelets: 269 10*3/uL (ref 150–400)
RBC: 4.81 MIL/uL (ref 3.87–5.11)
RDW: 14.2 % (ref 11.5–15.5)
WBC: 6.7 10*3/uL (ref 4.0–10.5)
nRBC: 0 % (ref 0.0–0.2)

## 2023-02-07 LAB — ETHANOL: Alcohol, Ethyl (B): <10 mg/dL (ref <10)

## 2023-02-07 LAB — ACETAMINOPHEN LEVEL: Acetaminophen (Tylenol), Serum: <10 ug/mL — ABNORMAL LOW (ref 10–30)

## 2023-02-07 LAB — SALICYLATE LEVEL: Salicylate Lvl: <7 mg/dL — ABNORMAL LOW (ref 7.0–30.0)

## 2023-02-07 NOTE — ED Notes (Signed)
Pt placed in psych safe clothing. Pt belongings placed in pt belongings bag. Pt has one pair of underwear, white shoes, and a pink dress.
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
## 2023-02-07 NOTE — ED Notes (Signed)
Pt's son and daughter here; request to speak with doctor regarding plan of care; son [name]) reports that he has "started [facility] form at home and does not want her committed to a rest home"; explained to pt st he has taken out IVC papers on his mom and this is considered a psychiatric evaluation at present; son & daughter [name]) voice understanding, however son st "I just wanted her to be somewhere for tonight so she can get her meds in her"; again explained to both intention of an IVC and the process involved; information sheet given regarding visitation policy; informed charge nurse & care nurse Scharlene Gloss, RN of pt's children's statements and desire to have a phone update; son reports that pt has had her morning meds and seen her PCP today at which time her seroquel dosage was increased (unsure of dosage and has not started yet); pt son & daughter's contact information is listed in demographics of pt's chart
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
## 2023-02-07 NOTE — ED Notes (Signed)
TTS at patient bedside 

## 2023-02-07 NOTE — Consult Note (Signed)
Telepsych Consultation   Reason for Consult:  Psych Evaluation Referring Physician:  Dr. 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 [name] on 02/07/23.  On evaluation Penny Hart reports that "aint nothing wrong with me, my children tryna take my money."  When asked what brings you to the hospital, patient replies, "I dont know, ask my son."    Per triage note, pt arrives with [place] [employer] with IVC papers. Per complainant, pt has been agressive and throwing objects at her family and sts that pt verbalized that she as going to kill them. Also, pt has been walking away from home without supervision. Pt is co operative with staff and crying. Pt sts that her daughter is taking over her care and not letter her know any information due to her having dementia. Pt sts that she asked her daughter about where her medication was being sent to and than when to pick them up at and the daughter said that she didn't know and the pt became upset, cause her daughter is suppose to be over her care.   During evaluation Penny Hart is in he wheel chair; she is alert/oriented x 4; agitated (because she's here)/cooperative; and mood congruent with affect.  Patient is speaking in a clear tone at moderate volume, and normal pace; with good eye contact.  Her thought process is coherent and relevant; There is no indication that she is  currently responding to internal/external stimuli or experiencing delusional thought content. Although she is upset with her daughter, she adamantly denies throwing things or being aggressive with her daughter today.  She gets annoyed at the indication that that was said.  "Lets take a lie detector test,"  she states.   Patient denies suicidal/self-harm/homicidal ideation, psychosis, and paranoia.  Patient has remained calm throughout assessment and has answered questions appropriately.    Per chart review patients children were seen by the nurse and reports the following, Pt's son and daughter here; request to speak with doctor regarding plan of care; son Penny Hart) reports that he has "started [facility] form at home and does not want her committed to a rest home"; explained to pt st he has taken out IVC papers on his mom and this is considered a psychiatric evaluation at present; son & daughter Penny Hart) voice understanding, however son st "I just wanted her to be somewhere for tonight so she can get her meds in her"; again explained to both intention of an IVC and the process involved; information sheet given regarding visitation policy; informed charge nurse & care nurse Scharlene Gloss, RN of pt's children's statements and desire to have a phone update; son reports that pt has had her morning meds and seen her PCP today at which time her seroquel dosage was increased (unsure of dosage and has not started yet); pt son & daughter's contact information is listed in demographics of pt's chart.  Recommendations:  Discharge in the AM if patient remains psychiatrically stable  Past Psychiatric History: Dementia  Risk to Self:   Risk to Others:   Prior Inpatient Therapy:   Prior Outpatient Therapy:    Past Medical History:  Past Medical History:  Diagnosis Date   Arthritis    CHF (congestive heart failure) (HCC)    COPD (chronic obstructive pulmonary disease) (HCC)    Coronary artery disease     Diabetes mellitus without complication (HCC)    GERD (gastroesophageal reflux disease)    Glaucoma    Headache    Hiatal hernia    Hyperlipidemia    Hypertension    Neuropathy    Obesity    Peripheral neuropathy    Pseudophakia    Sleep apnea    Spinal stenosis    Spondylolisthesis of lumbar region    Thoracic aortic atherosclerosis (HCC)     Past Surgical History:  Procedure Laterality Date   ABDOMINAL HYSTERECTOMY     BACK SURGERY  2019   [facility] [place]   BREAST EXCISIONAL BIOPSY Left yrs ago    benign   CATARACT EXTRACTION W/ INTRAOCULAR LENS  IMPLANT, BILATERAL     COLONOSCOPY N/A 06/01/2020   Procedure: COLONOSCOPY;  Surgeon: Regis Bill, MD;  Location: [facility] ENDOSCOPY;  Service: Endoscopy;  Laterality: N/A;   COLONOSCOPY WITH PROPOFOL N/A 05/30/2015   Procedure: COLONOSCOPY WITH PROPOFOL;  Surgeon: Midge Minium, MD;  Location: [facility] ENDOSCOPY;  Service: Endoscopy;  Laterality: N/A;   ESOPHAGOGASTRODUODENOSCOPY N/A 06/01/2020   Procedure: ESOPHAGOGASTRODUODENOSCOPY (EGD);  Surgeon: Regis Bill, MD;  Location: [facility] ENDOSCOPY;  Service: Endoscopy;  Laterality: N/A;   ESOPHAGOGASTRODUODENOSCOPY (EGD) WITH PROPOFOL N/A 05/30/2015   Procedure: ESOPHAGOGASTRODUODENOSCOPY (EGD) WITH PROPOFOL;  Surgeon: Midge Minium, MD;  Location: [facility] ENDOSCOPY;  Service: Endoscopy;  Laterality: N/A;   EYE SURGERY     Bialteral for glaucoma   Eye surgery for glaucoma     JOINT REPLACEMENT     Left   LEFT HEART CATH AND CORONARY ANGIOGRAPHY Left 01/30/2020   Procedure: LEFT HEART CATH AND CORONARY ANGIOGRAPHY;  Surgeon: Alwyn Pea, MD;  Location: [facility] INVASIVE CV LAB;  Service: Cardiovascular;  Laterality: Left;   Left total knee replacement     OOPHORECTOMY     PELVIC LAPAROSCOPY     POSTERIOR LUMBAR FUSION  04/27/2017   TUBAL LIGATION     Family History:  Family History  Problem Relation Age of Onset   Diabetes Mother    Family Psychiatric  History: unknown Social  History:  Social History   Substance and Sexual Activity  Alcohol Use Yes   Alcohol/week: 1.0 standard drink of alcohol   Types: 1 Glasses of wine per week     Social History   Substance and Sexual Activity  Drug Use No    Social History   Socioeconomic History   Marital status: Widowed    Spouse name: Not on file   Number of children: Not on file   Years of education: Not on file   Highest education level: Not on file  Occupational History   Not on file  Tobacco Use   Smoking status: Former   Smokeless tobacco: Never  Vaping Use   Vaping status: Never Used  Substance and Sexual Activity   Alcohol use: Yes    Alcohol/week: 1.0 standard drink of alcohol    Types: 1 Glasses of wine per week   Drug use: No   Sexual activity: Not Currently  Other Topics  Concern   Not on file  Social History Narrative   Not on file   Social Determinants of Health   Financial Resource Strain: Not on file  Food Insecurity: Not on file  Transportation Needs: Not on file  Physical Activity: Not on file  Stress: Not on file  Social Connections: Not on file   Additional Social History:    Allergies:   Allergies  Allergen Reactions   Oxycodone-Acetaminophen Rash    UNSPECIFIED REACTION     Tramadol Rash and Other (See Comments)    INTOLERANCE > Chest pain Other reaction(s): Other (See Comments) Chest pain    Labs:  Results for orders placed or performed during the hospital encounter of 02/07/23 (from the past 48 hour(s))  Comprehensive metabolic panel     Status: Abnormal   Collection Time: 02/07/23  8:35 PM  Result Value Ref Range   Sodium 138 135 - 145 mmol/L   Potassium 3.7 3.5 - 5.1 mmol/L   Chloride 105 98 - 111 mmol/L   CO2 23 22 - 32 mmol/L   Glucose, Bld 103 (H) 70 - 99 mg/dL    Comment: Glucose reference range applies only to samples taken after fasting for at least 8 hours.   BUN 10 8 - 23 mg/dL   Creatinine, Ser 1.61 0.44 - 1.00 mg/dL   Calcium 9.3 8.9 - 09.6  mg/dL   Total Protein 7.9 6.5 - 8.1 g/dL   Albumin 3.9 3.5 - 5.0 g/dL   AST 19 15 - 41 U/L   ALT 13 0 - 44 U/L   Alkaline Phosphatase 74 38 - 126 U/L   Total Bilirubin 0.7 0.3 - 1.2 mg/dL   GFR, Estimated >04 >54 mL/min    Comment: (NOTE) Calculated using the CKD-EPI Creatinine Equation (2021)    Anion gap 10 5 - 15    Comment: Performed at [facility], [address] [place], [place] [address]  Ethanol     Status: None   Collection Time: 02/07/23  8:35 PM  Result Value Ref Range   Alcohol, Ethyl (B) <10 <10 mg/dL    Comment: (NOTE) Lowest detectable limit for serum alcohol is 10 mg/dL.  For medical purposes only. Performed at [facility], [address] [place], [place] [address]   Salicylate level     Status: Abnormal   Collection Time: 02/07/23  8:35 PM  Result Value Ref Range   Salicylate Lvl <7.0 (L) 7.0 - 30.0 mg/dL    Comment: Performed at [facility], [address] [place], [place] [address]  Acetaminophen level     Status: Abnormal   Collection Time: 02/07/23  8:35 PM  Result Value Ref Range   Acetaminophen (Tylenol), Serum <10 (L) 10 - 30 ug/mL    Comment: (NOTE) Therapeutic concentrations vary significantly. A range of 10-30 ug/mL  may be an effective concentration for many patients. However, some  are best treated at concentrations outside of this range. Acetaminophen concentrations >150 ug/mL at 4 hours after ingestion  and >50 ug/mL at 12 hours after ingestion are often associated with  toxic reactions.  Performed at [facility], [address] [place], [place] [address]   cbc     Status: None   Collection Time: 02/07/23  8:35 PM  Result Value Ref Range   WBC 6.7 4.0 - 10.5 K/uL   RBC 4.81 3.87 - 5.11 MIL/uL   Hemoglobin 13.4 12.0 - 15.0 g/dL   HCT 57.8 46.9 -  46.0 %   MCV 88.8 80.0 - 100.0 fL   MCH 27.9 26.0 - 34.0 pg   MCHC 31.4 30.0 - 36.0 g/dL   RDW 16.1 09.6 - 04.5 %   Platelets 269 150 - 400  K/uL   nRBC 0.0 0.0 - 0.2 %    Comment: Performed at [facility], [address] [place], [place] [address]    Medications:  No current facility-administered medications for this encounter.   Current Outpatient Medications  Medication Sig Dispense Refill   amLODipine (NORVASC) 5 MG tablet Take 5 mg by mouth daily.      aspirin EC 81 MG tablet Take 81 mg by mouth daily. Swallow whole.     donepezil (ARICEPT) 5 MG tablet Take 5 mg by mouth at bedtime.     isosorbide mononitrate (IMDUR) 30 MG 24 hr tablet Take 30 mg by mouth daily.     lamoTRIgine (LAMICTAL) 25 MG tablet Take 25 mg by mouth 2 (two) times daily.     metoprolol succinate (TOPROL-XL) 25 MG 24 hr tablet Take 25 mg by mouth daily.     mirtazapine (REMERON) 7.5 MG tablet Take 7.5 mg by mouth at bedtime.     potassium chloride (KLOR-CON M) 10 MEQ tablet Take 1 tablet by mouth 2 (two) times daily.     pravastatin (PRAVACHOL) 10 MG tablet Take 1 tablet by mouth every evening.     QUEtiapine (SEROQUEL) 100 MG tablet Take 100 mg by mouth at bedtime.     torsemide (DEMADEX) 10 MG tablet Take 10 mg by mouth every morning.      Musculoskeletal: Strength & Muscle Tone: within normal limits Gait & Station: normal Patient leans: N/A          Psychiatric Specialty Exam:  Presentation  General Appearance: Appropriate for Environment; Casual  Eye Contact:Good  Speech:Clear and Coherent  Speech Volume:Normal  Handedness:Right   Mood and Affect  Mood:Angry  Affect:Appropriate; Congruent   Thought Process  Thought Processes:No data recorded Descriptions of Associations:No data recorded Orientation:No data recorded Thought Content:No data recorded History of Schizophrenia/Schizoaffective disorder:No data recorded Duration of Psychotic Symptoms:No data recorded Hallucinations:No data recorded Ideas of Reference:No data recorded Suicidal Thoughts:No data recorded Homicidal Thoughts:No data  recorded  Sensorium  Memory:No data recorded Judgment:Fair  Insight:Fair   Executive Functions  Concentration:Fair  Attention Span:Fair  Recall:Fair  Fund of Knowledge:Fair  Language:Fair   Psychomotor Activity  Psychomotor Activity:Psychomotor Activity: Decreased   Assets  Assets:Communication Skills; Desire for Improvement; Leisure Time; Physical Health; Social Support   Sleep  Sleep:Sleep: Good    Physical Exam: Physical Exam ROS Blood pressure (!) 146/74, pulse 60, temperature 98.8 F (37.1 C), temperature source Oral, resp. rate 18, height 5\' 5"  (1.651 m), weight 77.1 kg, SpO2 99%. Body mass index is 28.29 kg/m.   Disposition: No evidence of imminent risk to self or others at present.   Patient does not meet criteria for psychiatric inpatient admission. Supportive therapy provided about ongoing stressors. Discussed crisis plan, support from social network, calling 911, coming to the Emergency Department, and calling Suicide Hotline.  This service was provided via telemedicine using a 2-way, interactive audio and video technology.   Jearld Lesch, NP 02/07/2023 10:49 PM

## 2023-02-07 NOTE — ED Triage Notes (Addendum)
Pt arrives with [place] [employer] with IVC papers. Per complainant, pt has been agressive and throwing objects at her family and sts that pt verbalized that she as going to kill them. Also, pt has been walking away from home without supervision. Pt is co operative with staff and crying. Pt sts that her daughter is taking over her care and not letter her know any information due to her having dementia. Pt sts that she asked her daughter about where her medication was being sent to and than when to pick them up at and the daughter said that she didn't know and the pt became upset, cause her daughter is suppose to be over her care.

## 2023-02-07 NOTE — BH Assessment (Signed)
Comprehensive Clinical Assessment (CCA) Note  02/07/2023 Penny Hart [id number]  Chief Complaint: Patient is a 77 year old female presenting to [facility] ED under IVC. Per triage note Pt arrives with [place] [employer] with IVC papers. Per complainant, pt has been agressive and throwing objects at her family and sts that pt verbalized that she as going to kill them. Also, pt has been walking away from home without supervision. Pt is co operative with staff and crying. Pt sts that her daughter is taking over her care and not letter her know any information due to her having dementia. Pt sts that she asked her daughter about where her medication was being sent to and than when to pick them up at and the daughter said that she didn't know and the pt became upset, cause her daughter is suppose to be over her care. During assessment patient appears alert and oriented x4, calm and cooperative. Patient reports "my daughter and son are trying to take over my stuff, I got a little bit of dementia, I know I do because my doctor told me, it's about my money." Patient continues to report some frustration with her children, she reports that she lives alone but that her children will sometimes come to stay with her. Patient reports that she takes her medications as prescribed and she denies being aggressive towards her children. Patient denies SI/HI/AH/VH.  Per Psyc NP [name] patient to be reassessed  Chief Complaint  Patient presents with   Aggressive Behavior   Visit Diagnosis: Dementia    CCA Screening, Triage and Referral (STR)  Patient Reported Information How did you hear about [place]? Legal System  Referral name: No data recorded Referral phone number: No data recorded  Whom do you see for routine medical problems? No data recorded Practice/Facility Name: No data recorded Practice/Facility Phone Number: No data recorded Name of Contact: No data recorded Contact Number: No data recorded Contact Fax  Number: No data recorded Prescriber Name: No data recorded Prescriber Address (if known): No data recorded  What Is the Reason for Your Visit/Call Today? Pt arrives with [place] PD with IVC papers. Per complainant, pt has been agressive and throwing objects at her family and sts that pt verbalized that she as going to kill them. Also, pt has been walking away from home without supervision. Pt is co operative with staff and crying. Pt sts that her daughter is taking over her care and not letter her know any information due to her having dementia. Pt sts that she asked her daughter about where her medication was being sent to and than when to pick them up at and the daughter said that she didn't know and the pt became upset, cause her daughter is suppose to be over her care.  How Long Has This Been Causing You Problems? > than 6 months  What Do You Feel Would Help You the Most Today? No data recorded  Have You Recently Been in Any Inpatient Treatment (Hospital/Detox/Crisis Center/28-Day Program)? No data recorded Name/Location of Program/Hospital:No data recorded How Long Were You There? No data recorded When Were You Discharged? No data recorded  Have You Ever Received Services From [facility] Before? No data recorded Who Do You See at [facility]? No data recorded  Have You Recently Had Any Thoughts About Hurting Yourself? No  Are You Planning to Commit Suicide/Harm Yourself At This time? No   Have you Recently Had Thoughts About Hurting Someone [name]? No  Explanation: No  data recorded  Have You Used Any Alcohol or Drugs in the Past 24 Hours? No  How Long Ago Did You Use Drugs or Alcohol? No data recorded What Did You Use and How Much? No data recorded  Do You Currently Have a Therapist/Psychiatrist? No  Name of Therapist/Psychiatrist: No data recorded  Have You Been Recently Discharged From Any Office Practice or Programs? No  Explanation of Discharge From Practice/Program:  No data recorded    CCA Screening Triage Referral Assessment Type of Contact: Face-to-Face  Is this Initial or Reassessment? No data recorded Date Telepsych consult ordered in [facility]:  No data recorded Time Telepsych consult ordered in [facility]:  No data recorded  Patient Reported Information Reviewed? No data recorded Patient Left Without Being Seen? No data recorded Reason for Not Completing Assessment: No data recorded  Collateral Involvement: No data recorded  Does Patient Have a Court Appointed Legal Guardian? No data recorded Name and Contact of Legal Guardian: No data recorded If Minor and Not Living with Parent(s), Who has Custody? No data recorded Is CPS involved or ever been involved? Never  Is APS involved or ever been involved? Never   Patient Determined To Be At Risk for Harm To Self or Others Based on Review of Patient Reported Information or Presenting Complaint? No  Method: No data recorded Availability of Means: No data recorded Intent: No data recorded Notification Required: No data recorded Additional Information for Danger to Others Potential: No data recorded Additional Comments for Danger to Others Potential: No data recorded Are There Guns or Other Weapons in Your Home? No  Types of Guns/Weapons: No data recorded Are These Weapons Safely Secured?                            No data recorded Who Could Verify You Are Able To Have These Secured: No data recorded Do You Have any Outstanding Charges, Pending Court Dates, Parole/Probation? No data recorded Contacted To Inform of Risk of Harm To Self or Others: No data recorded  Location of Assessment: [facility] ED   Does Patient Present under Involuntary Commitment? Yes  IVC Papers Initial File Date: No data recorded  [place] of Residence: [place]   Patient Currently Receiving the Following Services: No data recorded  Determination of Need: Emergent (2 hours)   Options For Referral: No data  recorded    CCA Biopsychosocial Intake/Chief Complaint:  No data recorded Current Symptoms/Problems: No data recorded  Patient Reported Schizophrenia/Schizoaffective Diagnosis in Past: No   Strengths: Patient is able to communicate her needs  Preferences: No data recorded Abilities: No data recorded  Type of Services Patient Feels are Needed: No data recorded  Initial Clinical Notes/Concerns: No data recorded  Mental Health Symptoms Depression:   None   Duration of Depressive symptoms: No data recorded  Mania:   None   Anxiety:    Irritability; Restlessness; Worrying   Psychosis:   Delusions   Duration of Psychotic symptoms:  Greater than six months   Trauma:   None   Obsessions:   None   Compulsions:   "Driven" to perform behaviors/acts; Repeated behaviors/mental acts   Inattention:   None   Hyperactivity/Impulsivity:   None   Oppositional/Defiant Behaviors:   Temper   Emotional Irregularity:   None   Other Mood/Personality Symptoms:  No data recorded   Mental Status Exam Appearance and self-care  Stature:   Average   Weight:   Average weight  Clothing:   Casual   Grooming:   Normal   Cosmetic use:   None   Posture/gait:   Normal   Motor activity:   Not Remarkable   Sensorium  Attention:   Normal   Concentration:   Normal   Orientation:   X5   Recall/memory:   Normal   Affect and Mood  Affect:   Appropriate   Mood:   Anxious   Relating  Eye contact:   Normal   Facial expression:   Responsive   Attitude toward examiner:   Cooperative   Thought and Language  Speech flow:  Clear and Coherent   Thought content:   Appropriate to Mood and Circumstances   Preoccupation:   Ruminations   Hallucinations:   None   Organization:  No data recorded  [employer] of Knowledge:   Fair   Intelligence:   Average   Abstraction:   Functional   Judgement:   Fair   Dance movement psychotherapist:    Realistic   Insight:   Lacking; Poor   Decision Making:   Impulsive   Social Functioning  Social Maturity:   Impulsive   Social Judgement:   Heedless   Stress  Stressors:   Family conflict; Illness   Coping Ability:   Exhausted   Skill Deficits:   Responsibility; Self-control   Supports:   Family; Friends/Service system     Religion: Religion/Spirituality Are You A Religious Person?: No  Leisure/Recreation: Leisure / Recreation Do You Have Hobbies?: No  Exercise/Diet: Exercise/Diet Do You Exercise?: No Have You Gained or Lost A Significant Amount of Weight in the Past Six Months?: No Do You Follow a Special Diet?: No Do You Have Any Trouble Sleeping?: No   CCA Employment/Education Employment/Work Situation: Employment / 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 has Been Impacted by Current Illness: No Has Patient ever Been in Equities trader?: No  Education: Education Is Patient Currently Attending School?: No Did You Have An Individualized Education Program (IIEP): No Did You Have Any Difficulty At [employer]?: No Patient's Education Has Been Impacted by Current Illness: No   CCA Family/Childhood History Family and Relationship History: Family history Marital status: Single Does patient have children?: Yes How many children?: 2 How is patient's relationship with their children?: Patient has a good relationship with her children  Childhood History:  Childhood History Did patient suffer any verbal/emotional/physical/sexual abuse as a child?: No Did patient suffer from severe childhood neglect?: No Has patient ever been sexually abused/assaulted/raped as an adolescent or adult?: No Was the patient ever a victim of a crime or a disaster?: No Witnessed domestic violence?: No Has patient been affected by domestic violence as an adult?: No  Child/Adolescent Assessment:     CCA Substance Use Alcohol/Drug Use: Alcohol / Drug  Use Pain Medications: see mar Prescriptions: see mar Over the Counter: see mar History of alcohol / drug use?: No history of alcohol / drug abuse                         ASAM's:  Six Dimensions of Multidimensional Assessment  Dimension 1:  Acute Intoxication and/or Withdrawal Potential:      Dimension 2:  Biomedical Conditions and Complications:      Dimension 3:  Emotional, Behavioral, or Cognitive Conditions and Complications:     Dimension 4:  Readiness to Change:     Dimension 5:  Relapse, Continued use, or Continued Problem Potential:  Dimension 6:  Recovery/Living Environment:     ASAM Severity Score:    ASAM Recommended Level of Treatment:     Substance use Disorder (SUD)    Recommendations for Services/Supports/Treatments:    DSM5 Diagnoses: Patient Active Problem List   Diagnosis Date Noted   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   Surgery, elective    Chronic diastolic congestive heart failure (HCC)    Chronic obstructive pulmonary disease (HCC)    Diabetes mellitus type 2 in nonobese (HCC)    Benign essential HTN    Post-operative pain    Hyponatremia    Leukocytosis    Spondylolisthesis of lumbar region 04/27/2017   Blood in stool    Hiatal hernia    Benign neoplasm of cecum    Benign neoplasm of transverse colon    Diverticulosis of large intestine without diverticulitis    Rectal bleeding 05/29/2015   Non-intractable cyclical vomiting with nausea     Patient Centered Plan: Patient is on the following Treatment Plan(s):  Impulse Control   Referrals to Alternative Service(s): Referred to Alternative Service(s):   Place:   Date:   Time:    Referred to Alternative Service(s):   Place:   Date:   Time:    Referred to Alternative Service(s):   Place:   Date:   Time:    Referred to Alternative Service(s):   Place:   Date:   Time:      @[facility] @  [employer], LCAS-A

## 2023-02-08 DIAGNOSIS — F03918 Unspecified dementia, unspecified severity, with other behavioral disturbance: Secondary | ICD-10-CM | POA: Diagnosis not present

## 2023-02-08 MED ORDER — DONEPEZIL HCL 5 MG PO TABS
5.0000 mg | ORAL_TABLET | Freq: Every day | ORAL | Status: DC
  Administered 2023-02-08: 5 mg via ORAL
  Filled 2023-02-08: qty 1

## 2023-02-08 MED ORDER — PRAVASTATIN SODIUM 20 MG PO TABS: 10.0000 mg | ORAL_TABLET | Freq: Every evening | ORAL | Status: DC

## 2023-02-08 MED ORDER — QUETIAPINE FUMARATE ER 50 MG PO TB24: 100.0000 mg | ORAL_TABLET | Freq: Every day | ORAL | Status: DC

## 2023-02-08 MED ORDER — ISOSORBIDE MONONITRATE ER 60 MG PO TB24
30.0000 mg | ORAL_TABLET | Freq: Every day | ORAL | Status: DC
  Administered 2023-02-08: 30 mg via ORAL
  Filled 2023-02-08: qty 1

## 2023-02-08 MED ORDER — POTASSIUM CHLORIDE CRYS ER 20 MEQ PO TBCR
10.0000 meq | EXTENDED_RELEASE_TABLET | Freq: Two times a day (BID) | ORAL | Status: DC
  Administered 2023-02-08 (×2): 10 meq via ORAL
  Filled 2023-02-08 (×2): qty 1

## 2023-02-08 MED ORDER — TORSEMIDE 20 MG PO TABS
10.0000 mg | ORAL_TABLET | Freq: Every morning | ORAL | Status: DC
  Administered 2023-02-08: 10 mg via ORAL
  Filled 2023-02-08: qty 1

## 2023-02-08 MED ORDER — LAMOTRIGINE 25 MG PO TABS
25.0000 mg | ORAL_TABLET | Freq: Two times a day (BID) | ORAL | Status: DC
  Administered 2023-02-08 (×2): 25 mg via ORAL
  Filled 2023-02-08 (×2): qty 1

## 2023-02-08 MED ORDER — AMLODIPINE BESYLATE 5 MG PO TABS
5.0000 mg | ORAL_TABLET | Freq: Every day | ORAL | Status: DC
  Administered 2023-02-08: 5 mg via ORAL
  Filled 2023-02-08: qty 1

## 2023-02-08 MED ORDER — QUETIAPINE FUMARATE 25 MG PO TABS
100.0000 mg | ORAL_TABLET | Freq: Every day | ORAL | Status: DC
  Administered 2023-02-08: 100 mg via ORAL
  Filled 2023-02-08: qty 4

## 2023-02-08 MED ORDER — ASPIRIN 81 MG PO TBEC
81.0000 mg | DELAYED_RELEASE_TABLET | Freq: Every day | ORAL | Status: DC
  Administered 2023-02-08: 81 mg via ORAL
  Filled 2023-02-08: qty 1

## 2023-02-08 MED ORDER — MIRTAZAPINE 15 MG PO TABS
7.5000 mg | ORAL_TABLET | Freq: Every day | ORAL | Status: DC
  Administered 2023-02-08: 7.5 mg via ORAL
  Filled 2023-02-08: qty 1

## 2023-02-08 MED ORDER — METOPROLOL SUCCINATE ER 25 MG PO TB24
25.0000 mg | ORAL_TABLET | Freq: Every day | ORAL | Status: DC
  Administered 2023-02-08: 25 mg via ORAL
  Filled 2023-02-08: qty 1

## 2023-02-08 NOTE — ED Notes (Signed)
Pt resting in bed in no acute distress 

## 2023-02-08 NOTE — ED Notes (Signed)
Report to ashley, rn

## 2023-02-08 NOTE — ED Notes (Signed)
Patient ambulated with assist of cane and this RN from bed to toilet and back.

## 2023-02-08 NOTE — ED Notes (Signed)
Pt awake, looking around department in no acute distress, intermittently conversing with staff.

## 2023-02-08 NOTE — ED Notes (Signed)
Pt awake, sitting on side of bed conversing with staff.

## 2023-02-08 NOTE — Consult Note (Signed)
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 female patient with a charted history of dementia. Patient states, "I got dementia".  HPI:  Patient seen face to face by this provider, consulted with emergency department physician Dr. Vicente Hart; and chart reviewed on 02/08/23. Penny Hart, 77 y.o., female was brought to [facility] ED via law enforcement under Involuntary Commitment (IVC) filed by her son Penny Hart). According to the Affidavit and Petition for IVC: Respondent has dementia and is being very combative today. She is throwing objects at her children and has stated that she was going to kill her children as well.  Respondent also is walking away from her residence with supervision.  On chart review, patient was seen by her primary care provider, Penny Hart, at [facility] the previous day. It appears her Seroquel was increased from 50 mg to 100 mg at bedtime due to increased confusion and hallucinations.  The patient's primary care provider has noted that she is resistant to home health but clearly needs assistance.  On evaluation today, Penny Hart reports that her children noticed her acting mean the previous night and brought her to the hospital.  She is unsure of the exact cause of her agitation but believes she may have been upset about something. She says her children noticed changes in her behavior and took her to her doctor.  Patient says she is widowed, retired, and lives with her 2 sons who take turns caring for her. She says she has 3 boys and 1 girl. Patient says her children's names are Penny Hart,  and Penny Hart, all residing in [place]. She says that she feels safe at home and is compliant with her medications.  Her sleep is reported as satisfactory, and her appetite is described as fine.  She says she receives Meals on Wheels during the week, and her sons prepare her meals on weekends.  She says she has bad knee and heart trouble.  During the evaluation, Penny Hart is pleasant and cooperative. She is alert and oriented to the current month and state. She exhibits confusion regarding the current year, believes it is 2000, and incorrectly identifies today is Friday instead of Wednesday. Objectively there is no evidence of psychosis/mania or delusional thinking. She denies suicidal/self-harm/homicidal ideation, psychosis, and paranoia. Patient repeatedly asked about the location of her billfold.  Collateral: I spoke with the patient's son, Penny Hart, who reports that his mother sometimes has difficulty remembering certain things and has behavioral issues, mostly at night.  He also reports that his mother has recently started experiencing visual hallucinations, similar to her sister who had dementia and her mother who did not have dementia. He reports that her primary care provider is Penny Hart at [facility], last seen the previous day. Penny Hart reports he has spoken with Penny Hart about a home health aide and completion of an [place] 2 form. Discussed with Penny Hart, the benefits and side effects of changing Seroquel formulary to the XR, he agrees.   7/16: HPI on admission per Penny Liner, NP  Tele psych Assessment Patient location:  [facility] ER 24H Provider location:  Remote Office   Penny Hart, 77 y.o., female patient with history of dementia brought to [facility] for aggressive behavior, seen via tele health by TTS and this provider; chart reviewed and consulted with Dr. [name] on 02/07/23.  On evaluation Penny Hart reports that "aint nothing wrong with me, my children tryna take my Hart."  When  asked what brings you to the hospital, patient replies, "I dont know, ask my son."     Per triage note, pt arrives with [place] [employer] with IVC papers. Per complainant, pt has been agressive and throwing objects at her family and sts that pt verbalized that she as going to kill them. Also, pt has been walking away from home without supervision. Pt is co operative with staff and crying. Pt sts that her daughter is taking over her care and not letter her know any information due to her having dementia. Pt sts that she asked her daughter about where her medication was being sent to and than when to pick them up at and the daughter said that she didn't know and the pt became upset, cause her daughter is suppose to be over her care.    During evaluation Penny Hart is in he wheel chair; she is alert/oriented x 4; agitated (because she's here)/cooperative; and mood congruent with affect.  Patient is speaking in a clear tone at moderate volume, and normal pace; with good eye contact.  Her thought process is coherent and relevant; There is no indication that she is currently responding to internal/external stimuli or experiencing delusional thought content. Although she is upset with her daughter, she adamantly denies throwing things or being aggressive with her daughter today.  She gets annoyed at the indication that that was said.  "Lets take a lie detector test,"  she states.   Patient denies suicidal/self-harm/homicidal ideation, psychosis, and paranoia.  Patient has remained calm throughout assessment and has answered questions appropriately.     Per chart review patients children were seen by the nurse and reports the following, Pt's son and daughter here; request to speak with doctor regarding plan of care; son Penny Hart) reports that he has "started [facility] form at home and does not want her committed to a rest home"; explained to pt st he has taken out IVC papers on his mom and this is considered a  psychiatric evaluation at present; son & daughter Penny Hart) voice understanding, however son st "I just wanted her to be somewhere for tonight so she can get her meds in her"; again explained to both intention of an IVC and the process involved; information sheet given regarding visitation policy; informed charge nurse & care nurse Scharlene Gloss, RN of pt's children's statements and desire to have a phone update; son reports that pt has had her morning meds and seen her PCP today at which time her seroquel dosage was increased (unsure of dosage and has not started yet); pt son & daughter's contact information is listed in demographics of pt's chart.   Recommendations:  Discharge in the AM if patient remains psychiatrically stable   Past Psychiatric History: Patient has a charted history of dementia.  Risk to Self:  No  Risk to Others:  No Prior Inpatient Therapy:  No  Prior Outpatient Therapy: No  Past Medical History:  Past Medical History:  Diagnosis Date   Arthritis    CHF (congestive heart failure) (HCC)    COPD (chronic obstructive pulmonary disease) (HCC)    Coronary artery disease    Diabetes  mellitus without complication (HCC)    GERD (gastroesophageal reflux disease)    Glaucoma    Headache    Hiatal hernia    Hyperlipidemia    Hypertension    Neuropathy    Obesity    Peripheral neuropathy    Pseudophakia    Sleep apnea    Spinal stenosis    Spondylolisthesis of lumbar region    Thoracic aortic atherosclerosis [facility])     Past Surgical History:  Procedure Laterality Date   ABDOMINAL HYSTERECTOMY     BACK SURGERY  2019   CONE Miguel Barrera   BREAST EXCISIONAL BIOPSY Left yrs ago    benign   CATARACT EXTRACTION W/ INTRAOCULAR LENS  IMPLANT, BILATERAL     COLONOSCOPY N/A 06/01/2020   Procedure: COLONOSCOPY;  Surgeon: Regis Bill, MD;  Location: [facility] ENDOSCOPY;  Service: Endoscopy;  Laterality: N/A;   COLONOSCOPY WITH PROPOFOL N/A 05/30/2015   Procedure:  COLONOSCOPY WITH PROPOFOL;  Surgeon: Midge Minium, MD;  Location: [facility] ENDOSCOPY;  Service: Endoscopy;  Laterality: N/A;   ESOPHAGOGASTRODUODENOSCOPY N/A 06/01/2020   Procedure: ESOPHAGOGASTRODUODENOSCOPY (EGD);  Surgeon: Regis Bill, MD;  Location[facility] ENDOSCOPY;  Service: Endoscopy;  Laterality: N/A;   ESOPHAGOGASTRODUODENOSCOPY (EGD) WITH PROPOFOL N/A 05/30/2015   Procedure: ESOPHAGOGASTRODUODENOSCOPY (EGD) WITH PROPOFOL;  Surgeon: Midge Minium, MD;  Location: [facility] ENDOSCOPY;  Service: Endoscopy;  Laterality: N/A;   EYE SURGERY     Bialteral for glaucoma   Eye surgery for glaucoma     JOINT REPLACEMENT     Left   LEFT HEART CATH AND CORONARY ANGIOGRAPHY Left 01/30/2020   Procedure: LEFT HEART CATH AND CORONARY ANGIOGRAPHY;  Surgeon: Alwyn Pea, MD;  Location: [facility] INVASIVE CV LAB;  Service: Cardiovascular;  Laterality: Left;   Left total knee replacement     OOPHORECTOMY     PELVIC LAPAROSCOPY     POSTERIOR LUMBAR FUSION  04/27/2017   TUBAL LIGATION     Family History:  Family History  Problem Relation Age of Onset   Diabetes Mother    Family Psychiatric  History: Patient's son reports her sister has dementia with hallucinations.  Social History:  Social History   Substance and Sexual Activity  Alcohol Use Yes   Alcohol/week: 1.0 standard drink of alcohol   Types: 1 Glasses of wine per week     Social History   Substance and Sexual Activity  Drug Use No    Social History   Socioeconomic History   Marital status: Widowed    Spouse name: Not on file   Number of children: Not on file   Years of education: Not on file   Highest education level: Not on file  Occupational History   Not on file  Tobacco Use   Smoking status: Former   Smokeless tobacco: Never  Vaping Use   Vaping status: Never Used  Substance and Sexual Activity   Alcohol use: Yes    Alcohol/week: 1.0 standard drink of alcohol    Types: 1 Glasses of wine per week   Drug use: No   Sexual  activity: Not Currently  Other Topics Concern   Not on file  Social History Narrative   Not on file   Social Determinants of Health   Financial Resource Strain: Not on file  Food Insecurity: Not on file  Transportation Needs: Not on file  Physical Activity: Not on file  Stress: Not on file  Social Connections: Not on file   Additional Social History:    Allergies:  Allergies  Allergen Reactions   Oxycodone-Acetaminophen Rash    UNSPECIFIED REACTION     Tramadol Rash and Other (See Comments)    INTOLERANCE > Chest pain Other reaction(s): Other (See Comments) Chest pain    Labs:  Results for orders placed or performed during the hospital encounter of 02/07/23 (from the past 48 hour(s))  Comprehensive metabolic panel     Status: Abnormal   Collection Time: 02/07/23  8:35 PM  Result Value Ref Range   Sodium 138 135 - 145 mmol/L   Potassium 3.7 3.5 - 5.1 mmol/L   Chloride 105 98 - 111 mmol/L   CO2 23 22 - 32 mmol/L   Glucose, Bld 103 (H) 70 - 99 mg/dL    Comment: Glucose reference range applies only to samples taken after fasting for at least 8 hours.   BUN 10 8 - 23 mg/dL   Creatinine, Ser 3.24 0.44 - 1.00 mg/dL   Calcium 9.3 8.9 - 40.1 mg/dL   Total Protein 7.9 6.5 - 8.1 g/dL   Albumin 3.9 3.5 - 5.0 g/dL   AST 19 15 - 41 U/L   ALT 13 0 - 44 U/L   Alkaline Phosphatase 74 38 - 126 U/L   Total Bilirubin 0.7 0.3 - 1.2 mg/dL   GFR, Estimated >02 >72 mL/min    Comment: (NOTE) Calculated using the CKD-EPI Creatinine Equation (2021)    Anion gap 10 5 - 15    Comment: Performed at [facility], [address], [place], [place] [address]  Ethanol     Status: None   Collection Time: 02/07/23  8:35 PM  Result Value Ref Range   Alcohol, Ethyl (B) <10 <10 mg/dL    Comment: (NOTE) Lowest detectable limit for serum alcohol is 10 mg/dL.  For medical purposes only. Performed at [facility], [address] [place], [place] [address]   Salicylate  level     Status: Abnormal   Collection Time: 02/07/23  8:35 PM  Result Value Ref Range   Salicylate Lvl <7.0 (L) 7.0 - 30.0 mg/dL    Comment: Performed at [facility], [address] [place], [place] [address]  Acetaminophen level     Status: Abnormal   Collection Time: 02/07/23  8:35 PM  Result Value Ref Range   Acetaminophen (Tylenol), Serum <10 (L) 10 - 30 ug/mL    Comment: (NOTE) Therapeutic concentrations vary significantly. A range of 10-30 ug/mL  may be an effective concentration for many patients. However, some  are best treated at concentrations outside of this range. Acetaminophen concentrations >150 ug/mL at 4 hours after ingestion  and >50 ug/mL at 12 hours after ingestion are often associated with  toxic reactions.  Performed at [facility], [address] [place], [place] [address]   cbc     Status: None   Collection Time: 02/07/23  8:35 PM  Result Value Ref Range   WBC 6.7 4.0 - 10.5 K/uL   RBC 4.81 3.87 - 5.11 MIL/uL   Hemoglobin 13.4 12.0 - 15.0 g/dL   HCT 64.3 32.9 - 51.8 %   MCV 88.8 80.0 - 100.0 fL   MCH 27.9 26.0 - 34.0 pg   MCHC 31.4 30.0 - 36.0 g/dL   RDW 84.1 66.0 - 63.0 %   Platelets 269 150 - 400 K/uL   nRBC 0.0 0.0 - 0.2 %    Comment: Performed at [facility], [address] [place], [place] [address]  No current facility-administered medications for this encounter.   Current Outpatient Medications  Medication Sig Dispense Refill   amLODipine (NORVASC) 5 MG tablet Take 5 mg by mouth daily.      aspirin EC 81 MG tablet Take 81 mg by mouth daily. Swallow whole.     donepezil (ARICEPT) 5 MG tablet Take 5 mg by mouth at bedtime.     isosorbide mononitrate (IMDUR) 30 MG 24 hr tablet Take 30 mg by mouth daily.     lamoTRIgine (LAMICTAL) 25 MG tablet Take 25 mg by mouth 2 (two) times daily.     metoprolol succinate (TOPROL-XL) 25 MG 24 hr tablet Take 25 mg by mouth daily.     mirtazapine (REMERON) 7.5 MG tablet  Take 7.5 mg by mouth at bedtime.     potassium chloride (KLOR-CON M) 10 MEQ tablet Take 1 tablet by mouth 2 (two) times daily.     pravastatin (PRAVACHOL) 10 MG tablet Take 1 tablet by mouth every evening.     QUEtiapine (SEROQUEL) 100 MG tablet Take 100 mg by mouth at bedtime.     torsemide (DEMADEX) 10 MG tablet Take 10 mg by mouth every morning.      Musculoskeletal: Strength & Muscle Tone: decreased Gait & Station:  Did not assess Patient leans: N/A            Psychiatric Specialty Exam:  Presentation  General Appearance:  Appropriate for Environment  Eye Contact: Good  Speech: Clear and Coherent  Speech Volume: Normal  Handedness: Right   Mood and Affect  Mood: -- (Appropriate)  Affect: Appropriate; Congruent   Thought Process  Thought Processes:Coherent  Descriptions of Associations:Intact  Orientation:Partial  Thought Content:Logical  History of Schizophrenia/Schizoaffective disorder:No  Duration of Psychotic Symptoms:Greater than six months  Hallucinations:Hallucinations: None  Ideas of Reference:None  Suicidal Thoughts:Suicidal Thoughts: No  Homicidal Thoughts:Homicidal Thoughts: No   Sensorium  Memory:Immediate Fair; Recent Good  Judgment: Fair  Insight: Fair   Art therapist  Concentration: Fair  Attention Span: Fair  Recall: Fair  Fund of Knowledge: Fair  Language: Fair   Psychomotor Activity  Psychomotor Activity: Psychomotor Activity: Decreased   Assets  Assets: Desire for Improvement; Financial Resources/Insurance; Housing; Social Support   Sleep  Sleep: Sleep: Good   Physical Exam: Physical Exam Vitals and nursing note reviewed. Exam conducted with a chaperone present.  HENT:     Head: Normocephalic.     Nose: Nose normal.  Cardiovascular:     Rate and Rhythm: Bradycardia present.     Comments: Heart Rate 57 Pulmonary:     Effort: Pulmonary effort is normal.   Musculoskeletal:        General: Normal range of motion.     Cervical back: Normal range of motion.  Neurological:     Mental Status: She is alert. She is disoriented.     Comments: Generalized weakness  Psychiatric:        Attention and Perception: Attention and perception normal. She does not perceive auditory or visual hallucinations.        Mood and Affect: Mood and affect normal.        Speech: Speech normal.        Behavior: Behavior is cooperative.        Thought Content: Thought content is not paranoid or delusional. Thought content does not include homicidal or suicidal ideation.        Cognition and Memory: Cognition is impaired. She exhibits impaired recent memory.  Review of Systems  Neurological:        Generalized weakness  Psychiatric/Behavioral:  Positive for memory loss.   All other systems reviewed and are negative.  Blood pressure 114/62, pulse (!) 57, temperature 98.1 F (36.7 C), temperature source Oral, resp. rate 16, height 5\' 5"  (1.651 m), weight 77.1 kg, SpO2 90%. Body mass index is 28.29 kg/m.  Treatment Plan Summary: Plan : 77 y.o. female with a charted history of dementia, presented with increased confusion, and aggressive behaviors.  She admits to periods of frustration towards her children due to having trouble with her memory, and acknowledges a diagnosis of dementia.  She is unable to recall the circumstances that occurred the previous night and says she probably got upset about something and it did not mean towards her children.  She says that she feels safe at home and that her children are all very supportive.  She is currently not homicidal or suicidal, nor is there evidence of acute psychosis.   She has remained calm and medication compliant during her hospitalization.  She no longer meets IVC criteria at this time.    Plan: - Change Seroquel formulary to XR as discussed with son. - Advised son to discuss with primary care provider about  additional resources such as respite care and Physical and/or Occupational Therapy. - Advised family to continue to work with primary care provider to obtain Medicaid and explore options for home health assistance. - Advised family to discuss with primary care provider about potential interventions or therapies to address cognitive decline.  Disposition: No evidence of imminent risk to self or others at present.   Patient does not meet criteria for psychiatric inpatient admission. Supportive therapy provided about ongoing stressors. Discussed crisis plan, support from social network, calling 911, coming to the Emergency Department, and calling Suicide Hotline.  Norma Fredrickson, NP 02/08/2023 10:53 PM

## 2023-02-08 NOTE — ED Provider Notes (Signed)
Emergency Medicine Observation Re-evaluation Note  Penny Hart is a 77 y.o. female, seen on rounds today.  Pt initially presented to the ED for complaints of Aggressive Behavior Currently, the patient is resting.  Physical Exam  BP 130/62 (BP Location: Right Arm)   Pulse (!) 58   Temp 98.1 F (36.7 C) (Oral)   Resp 15   Ht 1.651 m (5\' 5" )   Wt 77.1 kg   SpO2 94%   BMI 28.29 kg/m  Physical Exam Gen:  No acute distress Resp:  Breathing easily and comfortably, no accessory muscle usage Neuro:  Moving all four extremities, no gross focal neuro deficits Psych:  Resting currently, calm when awake  ED Course / MDM  EKG:   I have reviewed the labs performed to date as well as medications administered while in observation.  Recent changes in the last 24 hours include initial EDP and psychiatry evaluation.  Plan  Current plan is for psych reassessment this morning to determine appropriate disposition.Loleta Rose, MD 02/08/23 [phone]

## 2023-02-08 NOTE — ED Notes (Signed)
Pt awake, states she wants her purse and to leave. Pt oriented by this rn to situation. Pt declines offer for po fluids, crackers and toilet.

## 2023-02-08 NOTE — ED Notes (Signed)
Pt provided with juice.

## 2023-02-17 ENCOUNTER — Telehealth: Payer: Self-pay

## 2023-02-17 NOTE — Telephone Encounter (Signed)
Transition Care Management Unsuccessful Follow-up Telephone Call  Date of discharge and from where:  Penny Hart 7/17  Attempts:  1st Attempt  Reason for unsuccessful TCM follow-up call:  No answer/busy   Penny Hart [facility] Guide, [facility] [phone] [address] [place], [place], [place] [address] Phone: [phone] Email: [email or web address]

## 2023-02-20 ENCOUNTER — Telehealth: Payer: Self-pay

## 2023-02-20 NOTE — Telephone Encounter (Signed)
Transition Care Management Unsuccessful Follow-up Telephone Call  Date of discharge and from where:  [place] 7/17  Attempts:  2nd Attempt  Reason for unsuccessful TCM follow-up call:  No answer/busy   Lenard Forth [facility] Guide, [facility] [phone] [address] [place], [place], [place] [address] Phone: [phone] Email: [email or web address]

## 2023-03-05 ENCOUNTER — Encounter: Payer: Self-pay | Admitting: Emergency Medicine

## 2023-03-05 ENCOUNTER — Emergency Department: Payer: Medicare HMO

## 2023-03-05 ENCOUNTER — Emergency Department
Admission: EM | Admit: 2023-03-05 | Discharge: 2023-03-05 | Disposition: A | Discharge status: Home / Self Care | Payer: Medicare HMO | Attending: Emergency Medicine | Admitting: Emergency Medicine

## 2023-03-05 ENCOUNTER — Other Ambulatory Visit: Payer: Self-pay

## 2023-03-05 DIAGNOSIS — L03213 Periorbital cellulitis: Secondary | ICD-10-CM | POA: Diagnosis not present

## 2023-03-05 DIAGNOSIS — I509 Heart failure, unspecified: Secondary | ICD-10-CM | POA: Diagnosis not present

## 2023-03-05 DIAGNOSIS — H1089 Other conjunctivitis: Secondary | ICD-10-CM | POA: Insufficient documentation

## 2023-03-05 DIAGNOSIS — I2510 Atherosclerotic heart disease of native coronary artery without angina pectoris: Secondary | ICD-10-CM | POA: Insufficient documentation

## 2023-03-05 DIAGNOSIS — I110 Hypertensive heart disease with heart failure: Secondary | ICD-10-CM | POA: Insufficient documentation

## 2023-03-05 DIAGNOSIS — E119 Type 2 diabetes mellitus without complications: Secondary | ICD-10-CM | POA: Insufficient documentation

## 2023-03-05 DIAGNOSIS — H1031 Unspecified acute conjunctivitis, right eye: Secondary | ICD-10-CM | POA: Diagnosis not present

## 2023-03-05 DIAGNOSIS — H5713 Ocular pain, bilateral: Secondary | ICD-10-CM | POA: Diagnosis not present

## 2023-03-05 DIAGNOSIS — H109 Unspecified conjunctivitis: Secondary | ICD-10-CM

## 2023-03-05 DIAGNOSIS — H5789 Other specified disorders of eye and adnexa: Secondary | ICD-10-CM | POA: Diagnosis present

## 2023-03-05 DIAGNOSIS — B9689 Other specified bacterial agents as the cause of diseases classified elsewhere: Secondary | ICD-10-CM | POA: Diagnosis not present

## 2023-03-05 DIAGNOSIS — R220 Localized swelling, mass and lump, head: Secondary | ICD-10-CM | POA: Diagnosis not present

## 2023-03-05 LAB — CBC WITH DIFFERENTIAL/PLATELET
Abs Immature Granulocytes: 0.01 10*3/uL (ref 0.00–0.07)
Basophils Absolute: 0 10*3/uL (ref 0.0–0.1)
Basophils Relative: 0 %
Eosinophils Absolute: 0 10*3/uL (ref 0.0–0.5)
Eosinophils Relative: 0 %
HCT: 45.4 % (ref 36.0–46.0)
Hemoglobin: 14.2 g/dL (ref 12.0–15.0)
Immature Granulocytes: 0 %
Lymphocytes Relative: 22 %
Lymphs Abs: 1.7 10*3/uL (ref 0.7–4.0)
MCH: 27.5 pg (ref 26.0–34.0)
MCHC: 31.3 g/dL (ref 30.0–36.0)
MCV: 88 fL (ref 80.0–100.0)
Monocytes Absolute: 0.7 10*3/uL (ref 0.1–1.0)
Monocytes Relative: 9 %
Neutro Abs: 5.1 10*3/uL (ref 1.7–7.7)
Neutrophils Relative %: 69 %
Platelets: 235 10*3/uL (ref 150–400)
RBC: 5.16 MIL/uL — ABNORMAL HIGH (ref 3.87–5.11)
RDW: 14.6 % (ref 11.5–15.5)
WBC: 7.5 10*3/uL (ref 4.0–10.5)
nRBC: 0 % (ref 0.0–0.2)

## 2023-03-05 LAB — BASIC METABOLIC PANEL
Anion gap: 10 (ref 5–15)
BUN: 7 mg/dL — ABNORMAL LOW (ref 8–23)
CO2: 22 mmol/L (ref 22–32)
Calcium: 9.3 mg/dL (ref 8.9–10.3)
Chloride: 105 mmol/L (ref 98–111)
Creatinine, Ser: 0.68 mg/dL (ref 0.44–1.00)
GFR, Estimated: >60 mL/min (ref >60)
Glucose, Bld: 130 mg/dL — ABNORMAL HIGH (ref 70–99)
Potassium: 4.2 mmol/L (ref 3.5–5.1)
Sodium: 137 mmol/L (ref 135–145)

## 2023-03-05 LAB — AEROBIC/ANAEROBIC CULTURE W GRAM STAIN (SURGICAL/DEEP WOUND)

## 2023-03-05 MED ORDER — IOHEXOL 300 MG/ML  SOLN
100.0000 mL | Freq: Once | INTRAMUSCULAR | Status: AC | PRN
  Administered 2023-03-05: 100 mL via INTRAVENOUS

## 2023-03-05 MED ORDER — POLYMYXIN B-TRIMETHOPRIM 10000-0.1 UNIT/ML-% OP SOLN: 2.0000 [drp] | OPHTHALMIC | 0 refills | Status: AC

## 2023-03-05 MED ORDER — SULFAMETHOXAZOLE-TRIMETHOPRIM 800-160 MG PO TABS
1.0000 | ORAL_TABLET | Freq: Once | ORAL | Status: AC
  Administered 2023-03-05: 1 via ORAL
  Filled 2023-03-05: qty 1

## 2023-03-05 MED ORDER — SULFAMETHOXAZOLE-TRIMETHOPRIM 800-160 MG PO TABS: 1.0000 | ORAL_TABLET | Freq: Two times a day (BID) | ORAL | 0 refills | Status: AC

## 2023-03-05 NOTE — ED Triage Notes (Signed)
Delay in triage: pt went to restroom.

## 2023-03-05 NOTE — ED Provider Notes (Signed)
[facility] Provider Note    Event Date/Time   First MD Initiated Contact with Patient 03/05/23 1106     (approximate)   History   Eye Drainage   HPI  Penny Hart is a 77 y.o. female   Past medical history of CAD, CHF, diabetes, hypertension hyperlipidemia presents emergency department with right eye drainage and pain.  Several days of right eye drainage and pain.   Profuse yellow purulent drainage out of the right eye.  Some swelling around the eye as well.  Painful.  No fever no systemic symptoms.  Not sexually active.    Independent Historian contributed to assessment above: Her daughter is at bedside to corroborate information given above    Physical Exam   Triage Vital Signs: ED Triage Vitals  Encounter Vitals Group     BP 03/05/23 0924 105/79     Systolic BP Percentile --      Diastolic BP Percentile --      Pulse Rate 03/05/23 0924 85     Resp 03/05/23 0924 18     Temp 03/05/23 0924 98.5 F (36.9 C)     Temp Source 03/05/23 0924 Oral     SpO2 03/05/23 0924 94 %     Weight 03/05/23 0923 169 lb 15.6 oz (77.1 kg)     Height 03/05/23 0923 5\' 5"  (1.651 m)     Head Circumference --      Peak Flow --      Pain Score 03/05/23 0923 6     Pain Loc --      Pain Education --      Exclude from Growth Chart --     Most recent vital signs: Vitals:   03/05/23 0924  BP: 105/79  Pulse: 85  Resp: 18  Temp: 98.5 F (36.9 C)  SpO2: 94%    General: Awake, no distress.  CV:  Good peripheral perfusion.  Resp:  Normal effort.  Abd:  No distention.  Other:  Profuse purulent discharge from the right eye.  Preseptal swelling and erythema.  Extraocular movements tact no proptosis.  Nontoxic appearance with normal vital signs and afebrile.   ED Results / Procedures / Treatments   Labs (all labs ordered are listed, but only abnormal results are displayed) Labs Reviewed  CBC WITH DIFFERENTIAL/PLATELET - Abnormal; Notable for the following  components:      Result Value   RBC 5.16 (*)    All other components within normal limits  BASIC METABOLIC PANEL - Abnormal; Notable for the following components:   Glucose, Bld 130 (*)    BUN 7 (*)    All other components within normal limits  CHLAMYDIA CULTURE  GONOCOCCUS CULTURE  AEROBIC/ANAEROBIC CULTURE W GRAM STAIN (SURGICAL/DEEP WOUND)     I ordered and reviewed the above labs they are notable for white blood cell count is normal.   RADIOLOGY I independently reviewed and interpreted CT of the orbits and see no obvious abscess I also reviewed radiologist's formal read.   PROCEDURES:  Critical Care performed: No  Procedures   MEDICATIONS ORDERED IN ED: Medications  iohexol (OMNIPAQUE) 300 MG/ML solution 100 mL (100 mLs Intravenous Contrast Given 03/05/23 1233)    IMPRESSION / MDM / ASSESSMENT AND PLAN / ED COURSE  I reviewed the triage vital signs and the nursing notes.  Patient's presentation is most consistent with acute presentation with potential threat to life or bodily function.  Differential diagnosis includes, but is not limited to, preseptal cellulitis, orbital cellulitis, sepsis, conjunctivitis   The patient is on the cardiac monitor to evaluate for evidence of arrhythmia and/or significant heart rate changes.  MDM: Profuse conjunctivitis and preseptal cellulitic changes in this 77 year old patient  With pain and diabetes, check CT of the orbits to rule out orbital cellulitis.  Does not appear septic given her nontoxic appearance normal vital signs afebrile no leukocytosis.  Not sexually active, check cultures, including chlamydia and gonorrhea, and treat for bacterial conjunctivitis with drops as well as preseptal cellulitis with oral antibiotics.  Assuming the CT scan is for orbital cellulitis plan will be for outpatient management with drops and oral antibiotics and [facility] follow-up.         FINAL  CLINICAL IMPRESSION(S) / ED DIAGNOSES   Final diagnoses:  Bacterial conjunctivitis  Preseptal cellulitis of right eye     Rx / DC Orders   ED Discharge Orders     None        Note:  This document was prepared using Dragon voice recognition software and may include unintentional dictation errors.,dm   [name], MD 03/05/23 1307

## 2023-03-05 NOTE — ED Notes (Signed)
See triage note  Presents with swelling and pain to right eye  Also noticed some drainage to eye yesterday

## 2023-03-05 NOTE — ED Triage Notes (Signed)
Patient to ED via POV for eye problem. Painful in both eyes x2 days but noted drainage coming out of right eye and swelling. Green in color. Also having headache.

## 2023-03-05 NOTE — Discharge Instructions (Addendum)
Your testing in the emergency department did not show any emergency conditions that would require hospitalization or surgeries like a deep space infection or whole-body infection.  For your eye infection, use eyedrops as prescribed for the full 7-day course.  For the swelling around your eye and skin infection around your eye take Bactrim antibiotic for the full 7-day course as prescribed as well.  Take acetaminophen 650 mg and ibuprofen 400 mg every 6 hours for pain.  Take with food.   Please call [facility] tomorrow morning to schedule follow-up appointment this week.

## 2023-03-06 ENCOUNTER — Telehealth: Payer: Self-pay

## 2023-03-08 DIAGNOSIS — H401123 Primary open-angle glaucoma, left eye, severe stage: Secondary | ICD-10-CM | POA: Diagnosis not present

## 2023-03-08 DIAGNOSIS — H109 Unspecified conjunctivitis: Secondary | ICD-10-CM | POA: Diagnosis not present

## 2023-03-09 ENCOUNTER — Telehealth: Payer: Self-pay | Admitting: Pharmacist

## 2023-03-09 DIAGNOSIS — E785 Hyperlipidemia, unspecified: Secondary | ICD-10-CM | POA: Diagnosis not present

## 2023-03-09 DIAGNOSIS — E1142 Type 2 diabetes mellitus with diabetic polyneuropathy: Secondary | ICD-10-CM | POA: Diagnosis not present

## 2023-03-09 DIAGNOSIS — Z0000 Encounter for general adult medical examination without abnormal findings: Secondary | ICD-10-CM | POA: Diagnosis not present

## 2023-03-09 DIAGNOSIS — M791 Myalgia, unspecified site: Secondary | ICD-10-CM | POA: Diagnosis not present

## 2023-03-09 DIAGNOSIS — I10 Essential (primary) hypertension: Secondary | ICD-10-CM | POA: Diagnosis not present

## 2023-03-09 DIAGNOSIS — E1169 Type 2 diabetes mellitus with other specified complication: Secondary | ICD-10-CM | POA: Diagnosis not present

## 2023-03-09 DIAGNOSIS — I25119 Atherosclerotic heart disease of native coronary artery with unspecified angina pectoris: Secondary | ICD-10-CM | POA: Diagnosis not present

## 2023-03-09 DIAGNOSIS — J4489 Other specified chronic obstructive pulmonary disease: Secondary | ICD-10-CM | POA: Diagnosis not present

## 2023-03-09 DIAGNOSIS — T466X5A Adverse effect of antihyperlipidemic and antiarteriosclerotic drugs, initial encounter: Secondary | ICD-10-CM | POA: Diagnosis not present

## 2023-03-09 NOTE — Progress Notes (Signed)
Pharmacy - Antimicrobial Stewardship  Below cultures came back from ED visit on 8/11.  Noted patient had appointment with PCP today (8/15) so send message regarding cultures.  I was asked to inform [facility] of results.  Called office and received fax number.  Culture info was faxed to them.  Juliette Alcide, PharmD, BCPS, BCIDP Work Cell: [phone] 03/09/2023 4:56 PM    Susceptibility data from last 90 days. Collected Specimen Info Organism CEFEPIME Ceftazidime Ciprofloxacin Clindamycin Erythromycin Gentamicin Susc lslt Imipenem Inducible Clindamycin Oxacillin Piperacillin + Tazobactam Rifampin TELAVANCIN TETRACYCLINE  03/05/23 Genital from Eye Pseudomonas aeruginosa  S  S  S    S  S    S     03/05/23 Eye Staphylococcus epidermidis    R  R  R  S   S  S   S  S  S   Collected Specimen Info Organism Trimethoprim/Sulfa  03/05/23 Genital from Eye Pseudomonas aeruginosa   03/05/23 Eye Staphylococcus epidermidis  R

## 2023-03-10 ENCOUNTER — Telehealth: Payer: Self-pay

## 2023-03-10 DIAGNOSIS — H109 Unspecified conjunctivitis: Secondary | ICD-10-CM | POA: Diagnosis not present

## 2023-03-10 NOTE — Telephone Encounter (Signed)
Transition Care Management Unsuccessful Follow-up Telephone Call  Date of discharge and from where:  03/05/2023 [facility]  Attempts:  1st Attempt  Reason for unsuccessful TCM follow-up call:  Voice mail full  [facility]enny [facility]  [facility] Population Health Community Resource Care Guide   [name]  ?? [phone]   Website: [email or web address]  [email or web address]

## 2023-03-10 NOTE — Telephone Encounter (Signed)
Transition Care Management Follow-up Telephone Call Date of discharge and from where: 03/05/2023 [facility] How have you been since you were released from the hospital? Patient's daughter stated she is feeling better. Any questions or concerns? No  Items Reviewed: Did the pt receive and understand the discharge instructions provided? Yes  Medications obtained and verified? Yes  Other? No  Any new allergies since your discharge? No  Dietary orders reviewed? Yes Do you have support at home? Yes   Follow up appointments reviewed:  PCP Hospital f/u appt confirmed? Yes  Scheduled to see Einar Crow, MD on 03/09/2023 @ [facility]. Specialist Hospital f/u appt confirmed?  Patient has seen her physician at [facility]  Scheduled to see  on  @ . Are transportation arrangements needed?  Per patient's daughter request emailed information for [employer] transportation. If their condition worsens, is the pt aware to call PCP or go to the Emergency Dept.? Yes Was the patient provided with contact information for the PCP's office or ED? Yes Was to pt encouraged to call back with questions or concerns? Yes  Delbert Darley [facility]  [facility] Population Health Community Resource Care Guide   [email or web address]  ?? [phone]   Website: [email or web address]  [email or web address]

## 2023-03-15 ENCOUNTER — Emergency Department
Admission: EM | Admit: 2023-03-15 | Discharge: 2023-03-15 | Disposition: A | Discharge status: Home / Self Care | Payer: Medicare HMO | Attending: Emergency Medicine | Admitting: Emergency Medicine

## 2023-03-15 ENCOUNTER — Other Ambulatory Visit: Payer: Self-pay

## 2023-03-15 DIAGNOSIS — J449 Chronic obstructive pulmonary disease, unspecified: Secondary | ICD-10-CM | POA: Insufficient documentation

## 2023-03-15 DIAGNOSIS — E119 Type 2 diabetes mellitus without complications: Secondary | ICD-10-CM | POA: Insufficient documentation

## 2023-03-15 DIAGNOSIS — I110 Hypertensive heart disease with heart failure: Secondary | ICD-10-CM | POA: Diagnosis not present

## 2023-03-15 DIAGNOSIS — H5711 Ocular pain, right eye: Secondary | ICD-10-CM | POA: Insufficient documentation

## 2023-03-15 DIAGNOSIS — I2510 Atherosclerotic heart disease of native coronary artery without angina pectoris: Secondary | ICD-10-CM | POA: Insufficient documentation

## 2023-03-15 DIAGNOSIS — I509 Heart failure, unspecified: Secondary | ICD-10-CM | POA: Insufficient documentation

## 2023-03-15 LAB — BASIC METABOLIC PANEL
Anion gap: 9 (ref 5–15)
BUN: 14 mg/dL (ref 8–23)
CO2: 23 mmol/L (ref 22–32)
Calcium: 9.1 mg/dL (ref 8.9–10.3)
Chloride: 102 mmol/L (ref 98–111)
Creatinine, Ser: 0.8 mg/dL (ref 0.44–1.00)
GFR, Estimated: >60 mL/min (ref >60)
Glucose, Bld: 139 mg/dL — ABNORMAL HIGH (ref 70–99)
Potassium: 3.7 mmol/L (ref 3.5–5.1)
Sodium: 134 mmol/L — ABNORMAL LOW (ref 135–145)

## 2023-03-15 LAB — CBC
HCT: 45.8 % (ref 36.0–46.0)
Hemoglobin: 14.5 g/dL (ref 12.0–15.0)
MCH: 27.5 pg (ref 26.0–34.0)
MCHC: 31.7 g/dL (ref 30.0–36.0)
MCV: 86.9 fL (ref 80.0–100.0)
Platelets: 290 10*3/uL (ref 150–400)
RBC: 5.27 MIL/uL — ABNORMAL HIGH (ref 3.87–5.11)
RDW: 14.8 % (ref 11.5–15.5)
WBC: 7.1 10*3/uL (ref 4.0–10.5)
nRBC: 0 % (ref 0.0–0.2)

## 2023-03-15 MED ORDER — ERYTHROMYCIN 5 MG/GM OP OINT: 1.0000 | TOPICAL_OINTMENT | Freq: Four times a day (QID) | OPHTHALMIC | 0 refills | Status: DC

## 2023-03-15 MED ORDER — HYDROCODONE-ACETAMINOPHEN 5-325 MG PO TABS: 1.0000 | ORAL_TABLET | ORAL | 0 refills | Status: DC | PRN

## 2023-03-15 MED ORDER — HYDROCODONE-ACETAMINOPHEN 5-325 MG PO TABS
1.0000 | ORAL_TABLET | Freq: Once | ORAL | Status: AC
  Administered 2023-03-15: 1 via ORAL
  Filled 2023-03-15: qty 1

## 2023-03-15 MED ORDER — TETRACAINE HCL 0.5 % OP SOLN
2.0000 [drp] | Freq: Once | OPHTHALMIC | Status: DC
  Filled 2023-03-15: qty 4

## 2023-03-15 NOTE — ED Triage Notes (Signed)
Pt to ED via POV from home. Pt reports right eye pain that has been present since 8/11. Pt was dx with pink eye and started on antibiotics. Pt also reports continued HA.

## 2023-03-15 NOTE — ED Provider Notes (Signed)
[facility] Provider Note    Event Date/Time   First MD Initiated Contact with Patient 03/15/23 1327     (approximate)  History   Chief Complaint: Eye Pain  HPI  Penny Hart is a 77 y.o. 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  Patient has a history of glaucoma has had multiple eye surgeries the last which was approximately 1 to 2 years ago per son.  Patient has not had any vision out of that eye since that time.  For last 2 weeks patient has been experiencing increased pain to the right eye has been following up with [facility] ophthalmology, has been prescribed antibiotic drops for possible eye infection.  Has another follow-up appointment coming up but the patient was in increased pain so she came to the emergency department hoping for some relief.  Physical Exam   Triage Vital Signs: ED Triage Vitals  Encounter Vitals Group     BP 03/15/23 1209 131/75     Systolic BP Percentile --      Diastolic BP Percentile --      Pulse Rate 03/15/23 1209 60     Resp 03/15/23 1209 18     Temp 03/15/23 1209 98.3 F (36.8 C)     Temp Source 03/15/23 1209 Oral     SpO2 03/15/23 1209 95 %     Weight --      Height --      Head Circumference --      Peak Flow --      Pain Score 03/15/23 1210 7     Pain Loc --      Pain Education --      Exclude from Growth Chart --     Most recent vital signs: Vitals:   03/15/23 1209  BP: 131/75  Pulse: 60  Resp: 18  Temp: 98.3 F (36.8 C)  SpO2: 95%    General: Awake, no distress.  CV:  Good peripheral perfusion.  Regular rate and rhythm  Resp:  Normal effort.  Equal breath sounds bilaterally.  Abd:  No distention. Other:  Patient's right eye is closed is able to manually open it she does have some mild erythema of  the tissue/sclera of the eye and opaque lens and states no vision including unable to see light.   ED Results / Procedures / Treatments   MEDICATIONS ORDERED IN ED: Medications  tetracaine (PONTOCAINE) 0.5 % ophthalmic solution 2 drop (has no administration in time range)  HYDROcodone-acetaminophen (NORCO/VICODIN) 5-325 MG per tablet 1 tablet (1 tablet Oral Given 03/15/23 1422)     IMPRESSION / MDM / ASSESSMENT AND PLAN / ED COURSE  I reviewed the triage vital signs and the nursing notes.  Patient's presentation is most consistent with acute presentation with potential threat to life or bodily function.  Patient presents emergency department for increased pain to her right eye.  States a history of right eye problems for many years has had at least 2 surgeries on the right eye including 1 to 2 years ago, no vision since that time.  Patient states increased pain over the last several weeks saw ophthalmology and has been started on eyedrops.  Was told that if the redness and pain did not go away that they may have to remove the eye.  Patient had increased pain over the last 2 days or so son states she was not able to sleep last night due to the pain so they brought her to the emergency department today.  On my exam the lenses opaque the sclera are erythematous.  Intraocular pressures on tonometry are 19-20.  After numbing the eye with tetracaine patient states he was already feeling better.  We will give the patient a Norco tablet and reassess.  I have also attempted to contact ophthalmology for any further recommendations.  Spoke with Dr. Inez Pilgrim of ophthalmology.  He recommends adding erythromycin ointment to the patient's antibiotic drops that she is currently taking.  He will see her in the office tomorrow.  Patient agreeable to plan of care will discharge with a short course of pain medication as well.  FINAL CLINICAL IMPRESSION(S) / ED DIAGNOSES   Right eye pain  Note:  This document  was prepared using Dragon voice recognition software and may include unintentional dictation errors.   Minna Antis, MD 03/15/23 [phone]

## 2023-03-15 NOTE — Discharge Instructions (Addendum)
Please follow-up with [facility] tomorrow.  Please take your pain medication as needed.  Please use your erythromycin ointment 4 times daily.  Return to the emergency department for any symptoms concerning to yourself.

## 2023-03-15 NOTE — ED Notes (Signed)
See triage notes. Patient c/o right eye pain. Previously been treated for pink eye

## 2023-03-16 DIAGNOSIS — Z87891 Personal history of nicotine dependence: Secondary | ICD-10-CM | POA: Diagnosis not present

## 2023-03-16 DIAGNOSIS — H5711 Ocular pain, right eye: Secondary | ICD-10-CM | POA: Diagnosis not present

## 2023-03-16 DIAGNOSIS — H409 Unspecified glaucoma: Secondary | ICD-10-CM | POA: Diagnosis not present

## 2023-03-16 DIAGNOSIS — H15091 Other scleritis, right eye: Secondary | ICD-10-CM | POA: Diagnosis not present

## 2023-03-16 DIAGNOSIS — H4389 Other disorders of vitreous body: Secondary | ICD-10-CM | POA: Diagnosis not present

## 2023-03-23 DIAGNOSIS — R6889 Other general symptoms and signs: Secondary | ICD-10-CM | POA: Diagnosis not present

## 2023-03-23 DIAGNOSIS — H44001 Unspecified purulent endophthalmitis, right eye: Secondary | ICD-10-CM | POA: Diagnosis not present

## 2023-03-23 DIAGNOSIS — H4389 Other disorders of vitreous body: Secondary | ICD-10-CM | POA: Diagnosis not present

## 2023-03-23 DIAGNOSIS — H409 Unspecified glaucoma: Secondary | ICD-10-CM | POA: Diagnosis not present

## 2023-03-24 DIAGNOSIS — H5943 Inflammation (infection) of postprocedural bleb, stage 3: Secondary | ICD-10-CM | POA: Diagnosis not present

## 2023-03-24 DIAGNOSIS — E1139 Type 2 diabetes mellitus with other diabetic ophthalmic complication: Secondary | ICD-10-CM | POA: Diagnosis not present

## 2023-03-24 DIAGNOSIS — H5940 Inflammation (infection) of postprocedural bleb, unspecified: Secondary | ICD-10-CM | POA: Diagnosis not present

## 2023-03-24 DIAGNOSIS — S0521XA Ocular laceration and rupture with prolapse or loss of intraocular tissue, right eye, initial encounter: Secondary | ICD-10-CM | POA: Diagnosis not present

## 2023-03-24 DIAGNOSIS — H4720 Unspecified optic atrophy: Secondary | ICD-10-CM | POA: Diagnosis not present

## 2023-03-24 DIAGNOSIS — H54415A Blindness right eye category 5, normal vision left eye: Secondary | ICD-10-CM | POA: Diagnosis not present

## 2023-03-24 DIAGNOSIS — H4089 Other specified glaucoma: Secondary | ICD-10-CM | POA: Diagnosis not present

## 2023-03-24 DIAGNOSIS — H168 Other keratitis: Secondary | ICD-10-CM | POA: Diagnosis not present

## 2023-03-24 DIAGNOSIS — H2011 Chronic iridocyclitis, right eye: Secondary | ICD-10-CM | POA: Diagnosis not present

## 2023-03-24 DIAGNOSIS — H5440 Blindness, one eye, unspecified eye: Secondary | ICD-10-CM | POA: Diagnosis not present

## 2023-03-24 DIAGNOSIS — H5711 Ocular pain, right eye: Secondary | ICD-10-CM | POA: Diagnosis not present

## 2023-03-24 DIAGNOSIS — H30891 Other chorioretinal inflammations, right eye: Secondary | ICD-10-CM | POA: Diagnosis not present

## 2023-03-28 DIAGNOSIS — R6889 Other general symptoms and signs: Secondary | ICD-10-CM | POA: Diagnosis not present

## 2023-03-28 DIAGNOSIS — H409 Unspecified glaucoma: Secondary | ICD-10-CM | POA: Diagnosis not present

## 2023-04-05 DIAGNOSIS — R6889 Other general symptoms and signs: Secondary | ICD-10-CM | POA: Diagnosis not present

## 2023-04-24 ENCOUNTER — Emergency Department
Admission: EM | Admit: 2023-04-24 | Discharge: 2023-04-24 | Disposition: A | Discharge status: Home / Self Care | Payer: Medicare HMO | Attending: Emergency Medicine | Admitting: Emergency Medicine

## 2023-04-24 DIAGNOSIS — E119 Type 2 diabetes mellitus without complications: Secondary | ICD-10-CM | POA: Insufficient documentation

## 2023-04-24 DIAGNOSIS — I509 Heart failure, unspecified: Secondary | ICD-10-CM | POA: Diagnosis not present

## 2023-04-24 DIAGNOSIS — J449 Chronic obstructive pulmonary disease, unspecified: Secondary | ICD-10-CM | POA: Diagnosis not present

## 2023-04-24 DIAGNOSIS — F03911 Unspecified dementia, unspecified severity, with agitation: Secondary | ICD-10-CM | POA: Diagnosis not present

## 2023-04-24 DIAGNOSIS — F03918 Unspecified dementia, unspecified severity, with other behavioral disturbance: Secondary | ICD-10-CM | POA: Diagnosis present

## 2023-04-24 DIAGNOSIS — I110 Hypertensive heart disease with heart failure: Secondary | ICD-10-CM | POA: Insufficient documentation

## 2023-04-24 DIAGNOSIS — R4689 Other symptoms and signs involving appearance and behavior: Secondary | ICD-10-CM | POA: Diagnosis not present

## 2023-04-24 DIAGNOSIS — R456 Violent behavior: Secondary | ICD-10-CM | POA: Diagnosis not present

## 2023-04-24 LAB — COMPREHENSIVE METABOLIC PANEL
ALT: 17 U/L (ref 0–44)
AST: 20 U/L (ref 15–41)
Albumin: 3.9 g/dL (ref 3.5–5.0)
Alkaline Phosphatase: 70 U/L (ref 38–126)
Anion gap: 9 (ref 5–15)
BUN: 11 mg/dL (ref 8–23)
CO2: 24 mmol/L (ref 22–32)
Calcium: 9.6 mg/dL (ref 8.9–10.3)
Chloride: 105 mmol/L (ref 98–111)
Creatinine, Ser: 0.84 mg/dL (ref 0.44–1.00)
GFR, Estimated: >60 mL/min (ref >60)
Glucose, Bld: 111 mg/dL — ABNORMAL HIGH (ref 70–99)
Potassium: 3.4 mmol/L — ABNORMAL LOW (ref 3.5–5.1)
Sodium: 138 mmol/L (ref 135–145)
Total Bilirubin: 0.6 mg/dL (ref 0.3–1.2)
Total Protein: 8.3 g/dL — ABNORMAL HIGH (ref 6.5–8.1)

## 2023-04-24 LAB — SALICYLATE LEVEL: Salicylate Lvl: <7 mg/dL — ABNORMAL LOW (ref 7.0–30.0)

## 2023-04-24 LAB — CBC
HCT: 45.2 % (ref 36.0–46.0)
Hemoglobin: 13.8 g/dL (ref 12.0–15.0)
MCH: 27.3 pg (ref 26.0–34.0)
MCHC: 30.5 g/dL (ref 30.0–36.0)
MCV: 89.5 fL (ref 80.0–100.0)
Platelets: 256 10*3/uL (ref 150–400)
RBC: 5.05 MIL/uL (ref 3.87–5.11)
RDW: 16.2 % — ABNORMAL HIGH (ref 11.5–15.5)
WBC: 7.3 10*3/uL (ref 4.0–10.5)
nRBC: 0 % (ref 0.0–0.2)

## 2023-04-24 LAB — ACETAMINOPHEN LEVEL: Acetaminophen (Tylenol), Serum: <10 ug/mL — ABNORMAL LOW (ref 10–30)

## 2023-04-24 LAB — ETHANOL: Alcohol, Ethyl (B): <10 mg/dL (ref <10)

## 2023-04-24 MED ORDER — MIRTAZAPINE 15 MG PO TABS: 7.5000 mg | ORAL_TABLET | Freq: Every day | ORAL | Status: DC

## 2023-04-24 MED ORDER — DORZOLAMIDE HCL-TIMOLOL MAL 2-0.5 % OP SOLN
1.0000 [drp] | Freq: Two times a day (BID) | OPHTHALMIC | Status: DC
  Filled 2023-04-24: qty 10

## 2023-04-24 MED ORDER — QUETIAPINE FUMARATE 25 MG PO TABS: 50.0000 mg | ORAL_TABLET | Freq: Every morning | ORAL | Status: DC

## 2023-04-24 MED ORDER — ASPIRIN 81 MG PO TBEC: 81.0000 mg | DELAYED_RELEASE_TABLET | Freq: Every day | ORAL | Status: DC

## 2023-04-24 MED ORDER — MIRTAZAPINE 15 MG PO TABS: 15.0000 mg | ORAL_TABLET | Freq: Every day | ORAL | 0 refills | Status: DC

## 2023-04-24 MED ORDER — LAMOTRIGINE 25 MG PO TABS: 50.0000 mg | ORAL_TABLET | Freq: Every day | ORAL | Status: DC

## 2023-04-24 MED ORDER — QUETIAPINE FUMARATE 150 MG PO TABS: 150.0000 mg | ORAL_TABLET | Freq: Every day | ORAL | 0 refills | Status: DC

## 2023-04-24 MED ORDER — QUETIAPINE FUMARATE 100 MG PO TABS: 150.0000 mg | ORAL_TABLET | Freq: Every day | ORAL | Status: DC

## 2023-04-24 MED ORDER — PRAVASTATIN SODIUM 20 MG PO TABS: 10.0000 mg | ORAL_TABLET | Freq: Every evening | ORAL | Status: DC

## 2023-04-24 MED ORDER — POTASSIUM CHLORIDE CRYS ER 20 MEQ PO TBCR: 10.0000 meq | EXTENDED_RELEASE_TABLET | Freq: Two times a day (BID) | ORAL | Status: DC

## 2023-04-24 MED ORDER — METOPROLOL SUCCINATE ER 25 MG PO TB24: 25.0000 mg | ORAL_TABLET | Freq: Every day | ORAL | Status: DC

## 2023-04-24 MED ORDER — QUETIAPINE FUMARATE 50 MG PO TABS: 50.0000 mg | ORAL_TABLET | Freq: Every morning | ORAL | 0 refills | Status: DC

## 2023-04-24 MED ORDER — ISOSORBIDE MONONITRATE ER 60 MG PO TB24: 30.0000 mg | ORAL_TABLET | Freq: Every day | ORAL | Status: DC

## 2023-04-24 MED ORDER — TORSEMIDE 20 MG PO TABS: 10.0000 mg | ORAL_TABLET | Freq: Every morning | ORAL | Status: DC

## 2023-04-24 MED ORDER — MIRTAZAPINE 15 MG PO TABS: 15.0000 mg | ORAL_TABLET | Freq: Every day | ORAL | Status: DC

## 2023-04-24 NOTE — ED Provider Notes (Signed)
-----------------------------------------   3:41 PM on 04/24/2023 -----------------------------------------   Blood pressure (!) 141/92, pulse (!) 57, temperature 98.7 F (37.1 C), temperature source Oral, resp. rate 15, height 5\' 5"  (1.651 m), weight 77.1 kg, SpO2 100%.  The patient is calm and cooperative at this time.  There have been no acute events since the last update.  Patient was evaluated by the psychiatry team who has made some medication adjustments.  She was previously medically cleared by previous provider and is now psychiatrically cleared.  She will be discharged back home and her son will pick her up.  Family agreeable with this plan.   Janith Lima, MD 04/24/23 [phone]

## 2023-04-24 NOTE — Consult Note (Signed)
[facility] Face-to-Face Psychiatry Consult   Reason for Consult:  Admit Referring Physician:  Dr. 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 female patient admitted under IVC by pt's son [name]. Per IVC: MY MOTHER HAS DEMENTIA. SHE WAS COMMITTED TWO OR THREE MONTHS AGO AND THEY ADJUSTED HER MEDICATION. IT SEEMED TO HELP AT FIRST BUT ABOUT THREE DAYS AGO HER BEHAVIOR CHANGED. IT IS AS IF SHE HAS BECOME IMMUNE TO THE MEDICINE. SHE YELLS AND CUSSES AT ME. SHE THROWS THINGS. SHE IS A FALL RISK BUT DOESN'T WANT ME TO HELP HER. SHE TRIES TO FIGHT WITH ME. TODAY, I TRIED TO TAKE HER TO THE DOCTOR TO GET SOME HELP BUT SHE IS REFUSING TO GO. SHE DOES NOT SLEEP LIKE SHE IS SUPPOSED TO AND SHE DID NOT SLEEP AT ALL LAST NIGHT. GENERALLY, SHE IS TAKING ALL OF HER MEDICATIONS AS PRESCRIBED BUT SHE MISSED HER MORNING MEDICATIONS THIS AM.  HPI:   Pt chart reviewed, and seen on floor. She is sleeping on approach. Wakens to name being called. States she is feeling "tired" today. Pt is alert, partially oriented, knows her first and last name, year of birth, states she is "[age] something"; she does not know the month or year "because I don't keep track of it"; she is correctly oriented to city as [place]. She denies suicidal, homicidal ideations. She denies auditory visual hallucinations or paranoia. About sleep, she states "sometimes I do, sometimes I don't". She feels her appetite is adequate.   Spoke w/ pt's son, [name], [phone]. He states he has been taking care of pt for the past 6 months. Pt is diagnosed with dementia. He lives with his mother. He states prior to 6 months ago his sister was taking care of pt. He states he and his sister are trying to keep pt out of rest  home. Reports pt has not been sleeping and has become increasingly combative in the past 2 to 3 days. Reports pt typically is medication adherent although refused morning medications today. He states he tried to get pt to go to the doctor today and she refused. He is requesting medication adjustments be made. Per Alinda Money, pt's current psychotropics include seroquel 50mg  qam and 100mg  at bedtime, mirtazapine 7.5mg  at bedtime. He states pt becomes more aggressive at nighttime. Discussed increasing seroquel from 100mg  to 150mg  at bedtime and mirtazapine from 7.5mg  to 15mg  at bedtime. He is in agreement. He states he can pick pt up today.   Past Psychiatric History: None reported  Risk to Self: No Risk to Others: No Prior Inpatient Therapy: No Prior Outpatient Therapy: Unknown  Past Medical History:  Past Medical History:  Diagnosis Date   Arthritis    CHF (congestive heart failure) (HCC)    COPD (chronic obstructive pulmonary disease) (HCC)    Coronary artery disease    Diabetes mellitus without complication (HCC)    GERD (gastroesophageal reflux disease)    Glaucoma    Headache    Hiatal hernia    Hyperlipidemia    Hypertension    Neuropathy    Obesity    Peripheral neuropathy    Pseudophakia    Sleep apnea    Spinal stenosis    Spondylolisthesis of lumbar region    Thoracic aortic atherosclerosis (  HCC)     Past Surgical History:  Procedure Laterality Date   ABDOMINAL HYSTERECTOMY     BACK SURGERY  2019   [facility]   BREAST EXCISIONAL BIOPSY Left yrs ago    benign   CATARACT EXTRACTION W/ INTRAOCULAR LENS  IMPLANT, BILATERAL     COLONOSCOPY N/A 06/01/2020   Procedure: COLONOSCOPY;  Surgeon: Regis Bill, MD;  Location: [facility] ENDOSCOPY;  Service: Endoscopy;  Laterality: N/A;   COLONOSCOPY WITH PROPOFOL N/A 05/30/2015   Procedure: COLONOSCOPY WITH PROPOFOL;  Surgeon: Midge Minium, MD;  Location: [facility] ENDOSCOPY;  Service: Endoscopy;  Laterality: N/A;    ESOPHAGOGASTRODUODENOSCOPY N/A 06/01/2020   Procedure: ESOPHAGOGASTRODUODENOSCOPY (EGD);  Surgeon: Regis Bill, MD;  Location: [facility] ENDOSCOPY;  Service: Endoscopy;  Laterality: N/A;   ESOPHAGOGASTRODUODENOSCOPY (EGD) WITH PROPOFOL N/A 05/30/2015   Procedure: ESOPHAGOGASTRODUODENOSCOPY (EGD) WITH PROPOFOL;  Surgeon: Midge Minium, MD;  Location: [facility] ENDOSCOPY;  Service: Endoscopy;  Laterality: N/A;   EYE SURGERY     Bialteral for glaucoma   Eye surgery for glaucoma     JOINT REPLACEMENT     Left   LEFT HEART CATH AND CORONARY ANGIOGRAPHY Left 01/30/2020   Procedure: LEFT HEART CATH AND CORONARY ANGIOGRAPHY;  Surgeon: Alwyn Pea, MD;  Location: [facility] INVASIVE CV LAB;  Service: Cardiovascular;  Laterality: Left;   Left total knee replacement     OOPHORECTOMY     PELVIC LAPAROSCOPY     POSTERIOR LUMBAR FUSION  04/27/2017   TUBAL LIGATION     Family History:  Family History  Problem Relation Age of Onset   Diabetes Mother    Family Psychiatric  History: None reported Social History:  Social History   Substance and Sexual Activity  Alcohol Use Yes   Alcohol/week: 1.0 standard drink of alcohol   Types: 1 Glasses of wine per week     Social History   Substance and Sexual Activity  Drug Use No    Social History   Socioeconomic History   Marital status: Widowed    Spouse name: Not on file   Number of children: Not on file   Years of education: Not on file   Highest education level: Not on file  Occupational History   Not on file  Tobacco Use   Smoking status: Former   Smokeless tobacco: Never  Vaping Use   Vaping status: Never Used  Substance and Sexual Activity   Alcohol use: Yes    Alcohol/week: 1.0 standard drink of alcohol    Types: 1 Glasses of wine per week   Drug use: No   Sexual activity: Not Currently  Other Topics Concern   Not on file  Social History Narrative   Not on file   Social Determinants of Health   Financial Resource Strain: Not on  file  Food Insecurity: Not on file  Transportation Needs: No Transportation Needs (03/10/2023)   PRAPARE - Administrator, Civil Service (Medical): No    Lack of Transportation (Non-Medical): No  Physical Activity: Not on file  Stress: Not on file  Social Connections: Not on file   Additional Social History:    Allergies:   Allergies  Allergen Reactions   Oxycodone-Acetaminophen Rash    UNSPECIFIED REACTION     Tramadol Rash and Other (See Comments)    INTOLERANCE > Chest pain Other reaction(s): Other (See Comments) Chest pain    Labs:  Results for orders placed or performed during the hospital encounter of 04/24/23 (from the  past 48 hour(s))  Comprehensive metabolic panel     Status: Abnormal   Collection Time: 04/24/23 10:14 AM  Result Value Ref Range   Sodium 138 135 - 145 mmol/L   Potassium 3.4 (L) 3.5 - 5.1 mmol/L   Chloride 105 98 - 111 mmol/L   CO2 24 22 - 32 mmol/L   Glucose, Bld 111 (H) 70 - 99 mg/dL    Comment: Glucose reference range applies only to samples taken after fasting for at least 8 hours.   BUN 11 8 - 23 mg/dL   Creatinine, Ser 1.61 0.44 - 1.00 mg/dL   Calcium 9.6 8.9 - 09.6 mg/dL   Total Protein 8.3 (H) 6.5 - 8.1 g/dL   Albumin 3.9 3.5 - 5.0 g/dL   AST 20 15 - 41 U/L   ALT 17 0 - 44 U/L   Alkaline Phosphatase 70 38 - 126 U/L   Total Bilirubin 0.6 0.3 - 1.2 mg/dL   GFR, Estimated >04 >54 mL/min    Comment: (NOTE) Calculated using the CKD-EPI Creatinine Equation (2021)    Anion gap 9 5 - 15    Comment: Performed at [facility], [address] [place], [place] [address]  Ethanol     Status: None   Collection Time: 04/24/23 10:14 AM  Result Value Ref Range   Alcohol, Ethyl (B) <10 <10 mg/dL    Comment: (NOTE) Lowest detectable limit for serum alcohol is 10 mg/dL.  For medical purposes only. Performed at [facility], [address] [place], [place] [address]   Salicylate level     Status: Abnormal    Collection Time: 04/24/23 10:14 AM  Result Value Ref Range   Salicylate Lvl <7.0 (L) 7.0 - 30.0 mg/dL    Comment: Performed at [facility], [address] [place], [place] [address]  Acetaminophen level     Status: Abnormal   Collection Time: 04/24/23 10:14 AM  Result Value Ref Range   Acetaminophen (Tylenol), Serum <10 (L) 10 - 30 ug/mL    Comment: (NOTE) Therapeutic concentrations vary significantly. A range of 10-30 ug/mL  may be an effective concentration for many patients. However, some  are best treated at concentrations outside of this range. Acetaminophen concentrations >150 ug/mL at 4 hours after ingestion  and >50 ug/mL at 12 hours after ingestion are often associated with  toxic reactions.  Performed at [facility], [address] [place], [place] [address]   cbc     Status: Abnormal   Collection Time: 04/24/23 10:14 AM  Result Value Ref Range   WBC 7.3 4.0 - 10.5 K/uL   RBC 5.05 3.87 - 5.11 MIL/uL   Hemoglobin 13.8 12.0 - 15.0 g/dL   HCT 57.8 46.9 - 62.9 %   MCV 89.5 80.0 - 100.0 fL   MCH 27.3 26.0 - 34.0 pg   MCHC 30.5 30.0 - 36.0 g/dL   RDW 52.8 (H) 41.3 - 24.4 %   Platelets 256 150 - 400 K/uL   nRBC 0.0 0.0 - 0.2 %    Comment: Performed at [facility], [address] [place], [place] [address]    Current Facility-Administered Medications  Medication Dose Route Frequency Provider Last Rate Last Admin   aspirin EC tablet 81 mg  81 mg Oral Daily Lauree Chandler, NP       dorzolamide-timolol (COSOPT) 2-0.5 % ophthalmic solution 1 drop  1 drop Left Eye BID Lauree Chandler, NP  isosorbide mononitrate (IMDUR) 24 hr tablet 30 mg  30 mg Oral Daily Lauree Chandler, NP       lamoTRIgine (LAMICTAL) tablet 50 mg  50 mg Oral QHS Lauree Chandler, NP       metoprolol succinate (TOPROL-XL) 24 hr tablet 25 mg  25 mg Oral Daily Lauree Chandler, NP       mirtazapine (REMERON) tablet 15 mg  15 mg Oral QHS Lauree Chandler, NP       potassium chloride SA (KLOR-CON M) CR tablet 10 mEq  10 mEq Oral BID Lauree Chandler, NP       pravastatin (PRAVACHOL) tablet 10 mg  10 mg Oral QPM Lauree Chandler, NP       QUEtiapine (SEROQUEL) tablet 150 mg  150 mg Oral QHS Lauree Chandler, NP       Melene Muller ON 04/25/2023] QUEtiapine (SEROQUEL) tablet 50 mg  50 mg Oral q AM Lauree Chandler, NP       [START ON 04/25/2023] torsemide (DEMADEX) tablet 10 mg  10 mg Oral q morning Lauree Chandler, NP       Current Outpatient Medications  Medication Sig Dispense Refill   dorzolamide-timolol (COSOPT) 2-0.5 % ophthalmic solution Place 1 drop into the left eye 2 (two) times daily.     amLODipine (NORVASC) 5 MG tablet Take 5 mg by mouth daily.      aspirin EC 81 MG tablet Take 81 mg by mouth daily. Swallow whole.     donepezil (ARICEPT) 5 MG tablet Take 5 mg by mouth at bedtime.     erythromycin ophthalmic ointment Place 1 Application into the right eye 4 (four) times daily. 3.5 g 0   isosorbide mononitrate (IMDUR) 30 MG 24 hr tablet Take 30 mg by mouth daily.     lamoTRIgine (LAMICTAL) 25 MG tablet Take 25 mg by mouth 2 (two) times daily.     metoprolol succinate (TOPROL-XL) 25 MG 24 hr tablet Take 25 mg by mouth daily.     mirtazapine (REMERON) 15 MG tablet Take 1 tablet (15 mg total) by mouth at bedtime. 30 tablet 0   potassium chloride (KLOR-CON M) 10 MEQ tablet Take 1 tablet by mouth 2 (two) times daily.     pravastatin (PRAVACHOL) 10 MG tablet Take 1 tablet by mouth every evening.     [START ON 04/25/2023] QUEtiapine (SEROQUEL) 50 MG tablet Take 1 tablet (50 mg total) by mouth in the morning. 30 tablet 0   QUEtiapine 150 MG TABS Take 150 mg by mouth at bedtime. 30 tablet 0   torsemide (DEMADEX) 10 MG tablet Take 10 mg by mouth every morning.      Musculoskeletal: Strength & Muscle Tone:  pt lying down on assessment Gait & Station:  pt lying down on assessment Patient leans:  pt lying down on  assessment  Psychiatric Specialty Exam:  Presentation  General Appearance:  Disheveled  Eye Contact: Minimal  Speech: Clear and Coherent; Slow  Speech Volume: Decreased  Handedness: Right   Mood and Affect  Mood: -- ("tired")  Affect: Flat   Thought Process  Thought Processes: Coherent  Descriptions of Associations:Intact  Orientation:Partial  Thought Content:WDL  History of Schizophrenia/Schizoaffective disorder:No  Duration of Psychotic Symptoms:Not applicable  Hallucinations:Hallucinations: None  Ideas of Reference:None  Suicidal Thoughts:Suicidal Thoughts: No  Homicidal Thoughts:Homicidal Thoughts: No   Sensorium  Memory: Immediate Fair  Judgment: Intact  Insight: Present   Executive Functions  Concentration: Fair  Attention Span: Fair  Recall: [employer] of Knowledge: Fair  Language: Fair   Psychomotor Activity  Psychomotor Activity: Psychomotor Activity: Decreased   Assets  Assets: Communication Skills; Desire for Improvement; Financial Resources/Insurance; Housing; Social Support   Sleep  Sleep: Sleep: Poor   Physical Exam: Physical Exam Constitutional:      General: She is not in acute distress.    Appearance: She is not ill-appearing, toxic-appearing or diaphoretic.  Eyes:     General: No scleral icterus.    Comments: Closed left eye  Cardiovascular:     Rate and Rhythm: Normal rate.  Pulmonary:     Effort: Pulmonary effort is normal.  Neurological:     Mental Status: She is alert.     Comments: Partially oriented  Psychiatric:        Attention and Perception: Attention and perception normal.        Mood and Affect: Mood normal. Affect is flat.        Speech: Speech normal.        Behavior: Behavior normal. Behavior is cooperative.        Thought Content: Thought content normal.    Review of Systems  Constitutional:  Negative for chills and fever.  Respiratory:  Negative for shortness of  breath.   Cardiovascular:  Negative for chest pain and palpitations.  Gastrointestinal:  Negative for abdominal pain.  Neurological:  Negative for headaches.  Psychiatric/Behavioral:  The patient has insomnia.    Blood pressure (!) 141/92, pulse (!) 57, temperature 98.7 F (37.1 C), temperature source Oral, resp. rate 15, height 5\' 5"  (1.651 m), weight 77.1 kg, SpO2 100%. Body mass index is 28.29 kg/m.  Treatment Plan Summary: Daily contact with patient to assess and evaluate symptoms and progress in treatment, Medication management, and Plan   -Increased pt's home mirtazapine from 7.5mg  at bedtime to 15mg  at bedtime -Increased pt's home seroquel from 100mg  at bedtime to 150mg  at bedtime  Disposition: No evidence of imminent risk to self or others at present.   Patient does not meet criteria for psychiatric inpatient admission.  Lauree Chandler, NP 04/24/2023 3:43 PM

## 2023-04-24 NOTE — ED Notes (Signed)
Son, [name] called from [phone] to check on mother. States that she needs her medications adjusted. She has not been sleeping at home.

## 2023-04-24 NOTE — ED Notes (Signed)
Discharge instructions reviewed with patient. Patient questions answered and opportunity for education reviewed. Patient voices understanding of discharge instructions with no further questions. Patient to lobby via wheelchair. 

## 2023-04-24 NOTE — Discharge Instructions (Signed)
Please pick up your new medications and take them as prescribed.  Please follow-up with your regular doctor for ongoing evaluation.

## 2023-04-24 NOTE — ED Triage Notes (Signed)
Pt to ED via [facility] under IVC. IVC paperwork states that daughter reports pt has dementia and was committed 2-3 months ago and dc with medication adjustments. Daughter reports pt cusses and yells at her and tries to fighter her. Pt calm and cooperative in triage.

## 2023-04-24 NOTE — ED Notes (Signed)
Pt belongings returned. Son arrived to take patient home.

## 2023-04-24 NOTE — ED Notes (Signed)
Meal placed at side.

## 2023-04-24 NOTE — ED Provider Notes (Signed)
[facility] Provider Note    Event Date/Time   First MD Initiated Contact with Patient 04/24/23 1033     (approximate)   History   Chief Complaint IVC    HPI  Penny Hart is a 77 y.o. female with past medical history of hypertension, diabetes, COPD, CHF, and dementia who presents to the ED for psychiatric evaluation.  Patient states she is not sure why she is here, thinks it may have been because she got into a fight with her family.  She currently denies any complaints, also denies any suicidal or homicidal ideation.  She states she has been taking her medications as prescribed.  She was reportedly placed under IVC prior to arrival due to increasing aggressive behavior with her family.     Physical Exam   Triage Vital Signs: ED Triage Vitals  Encounter Vitals Group     BP 04/24/23 1015 (!) 141/92     Systolic BP Percentile --      Diastolic BP Percentile --      Pulse Rate 04/24/23 1015 (!) 57     Resp 04/24/23 1015 15     Temp 04/24/23 1015 98.7 F (37.1 C)     Temp Source 04/24/23 1015 Oral     SpO2 04/24/23 1015 100 %     Weight 04/24/23 1016 169 lb 15.6 oz (77.1 kg)     Height 04/24/23 1016 5\' 5"  (1.651 m)     Head Circumference --      Peak Flow --      Pain Score 04/24/23 1028 0     Pain Loc --      Pain Education --      Exclude from Growth Chart --     Most recent vital signs: Vitals:   04/24/23 1015  BP: (!) 141/92  Pulse: (!) 57  Resp: 15  Temp: 98.7 F (37.1 C)  SpO2: 100%    Constitutional: Awake and alert. Eyes: Conjunctivae are normal. Head: Atraumatic. Nose: No congestion/rhinnorhea. Mouth/Throat: Mucous membranes are moist.  Cardiovascular: Normal rate, regular rhythm. Grossly normal heart sounds.  2+ radial pulses bilaterally. Respiratory: Normal respiratory effort.  No retractions. Lungs CTAB. Gastrointestinal: Soft and nontender. No distention. Musculoskeletal: No lower extremity tenderness nor edema.   Neurologic:  Normal speech and language. No gross focal neurologic deficits are appreciated.    ED Results / Procedures / Treatments   Labs (all labs ordered are listed, but only abnormal results are displayed) Labs Reviewed  COMPREHENSIVE METABOLIC PANEL - Abnormal; Notable for the following components:      Result Value   Potassium 3.4 (*)    Glucose, Bld 111 (*)    Total Protein 8.3 (*)    All other components within normal limits  SALICYLATE LEVEL - Abnormal; Notable for the following components:   Salicylate Lvl <7.0 (*)    All other components within normal limits  ACETAMINOPHEN LEVEL - Abnormal; Notable for the following components:   Acetaminophen (Tylenol), Serum <10 (*)    All other components within normal limits  CBC - Abnormal; Notable for the following components:   RDW 16.2 (*)    All other components within normal limits  ETHANOL  URINE DRUG SCREEN, QUALITATIVE ([facility] ONLY)    PROCEDURES:  Critical Care performed: No  Procedures   MEDICATIONS ORDERED IN ED: Medications - No data to display   IMPRESSION / MDM / ASSESSMENT AND PLAN / ED COURSE  I reviewed the triage  vital signs and the nursing notes.                              77 y.o. female with past medical history of hypertension, diabetes, COPD, CHF, and dementia who presents to the ED for psychiatric evaluation due to concern for increasing aggressive behavior.  Patient's presentation is most consistent with acute presentation with potential threat to life or bodily function.  Differential diagnosis includes, but is not limited to, dementia, psychosis, depression, anxiety, medication noncompliance, electrolyte abnormality.  Patient nontoxic-appearing and in no acute distress, vital signs are unremarkable.  She denies any complaints at this time, screening labs are pending.  She was placed under IVC prior to arrival but is currently calm and cooperative.  Screening labs with no significant  anemia, leukocytosis, lecture abnormality, or AKI.  Tylenol and salicylate levels are undetectable, patient may be medically cleared for psychiatric disposition.  Psych eval pending at this time.  The patient has been placed in psychiatric observation due to the need to provide a safe environment for the patient while obtaining psychiatric consultation and evaluation, as well as ongoing medical and medication management to treat the patient's condition.  The patient has been placed under full IVC at this time.       FINAL CLINICAL IMPRESSION(S) / ED DIAGNOSES   Final diagnoses:  Aggressive behavior     Rx / DC Orders   ED Discharge Orders     None        Note:  This document was prepared using Dragon voice recognition software and may include unintentional dictation errors.   Chesley Noon, MD 04/24/23 [phone]

## 2023-04-24 NOTE — ED Notes (Signed)
1 black purse 1 pair black slippers 1 pink shirt 1 black scarf 1 green necklace 1 pink necklace with key   2 bracelts

## 2023-04-24 NOTE — ED Notes (Signed)
IVC PAPERS  RESCINDED  PER  J  LEE  NP  INFORMED  [facility]  RN

## 2023-04-24 NOTE — BH Assessment (Signed)
Comprehensive Clinical Assessment (CCA) Screening, Triage and Referral Note  04/24/2023 Penny Hart [id number]  Penny Hart, 77 year old emale who presents to [facility] ED involuntarily for treatment. Per triage note, Pt to ED via BPD under IVC. IVC paperwork states that daughter reports pt has dementia and was committed 2-3 months ago and dc with medication adjustments. Daughter reports pt cusses and yells at her and tries to fighter her. Pt calm and cooperative in triage.   During TTS assessment pt presents alert and oriented x 4, restless but cooperative, and mood-congruent with affect. The pt does not appear to be responding to internal or external stimuli. Neither is the pt presenting with any delusional thinking. Pt verified the information provided to triage RN.   Pt identifies her main complaint to be that she got into an argument with her daughter. Patient says she has not been sleeping well. Patient states she wakes up a lot during the night. Patient is calm, cooperative, and speaking in linear sentences. Pt denies current SI/HI/AH/VH. Patient reports she is compliant with her medications.     Per Annice Pih, NP: Spoke w/ pt's son, [name], [phone]. He states he has been taking care of pt for the past 6 months. Pt is diagnosed with dementia. He lives with his mother. He states prior to 6 months ago his sister was taking care of pt. He states he and his sister are trying to keep pt out of rest home. Reports pt has not been sleeping and has become increasingly combative in the past 2 to 3 days. Reports pt typically is medication adherent although refused morning medications today. He states he tried to get pt to go to the doctor today and she refused. He is requesting medication adjustments be made. Per Alinda Money, pt's current psychotropics include Seroquel 50mg  qam and 100mg  at bedtime, mirtazapine 7.5mg  at bedtime. He states pt becomes more aggressive at nighttime. Discussed increasing Seroquel  from 100mg  to 150mg  at bedtime and mirtazapine from 7.5mg  to 15mg  at bedtime. He is in agreement. He states he can pick up pt today.   Per Annice Pih, NP, pt does not meet criteria for inpatient psychiatric admission and will discharged to her son, whom she will be living with.    Chief Complaint:  Chief Complaint  Patient presents with   IVC    Visit Diagnosis: Aggressive behavior due to dementia  Patient Reported Information How did you hear about [place]? Family/Friend  What Is the Reason for Your Visit/Call Today? Per IVC, patient was brought to the ED due to aggression towards family.  How Long Has This Been Causing You Problems? > than 6 months  What Do You Feel Would Help You the Most Today? -- (Assessment only)   Have You Recently Had Any Thoughts About Hurting Yourself? No  Are You Planning to Commit Suicide/Harm Yourself At This time? No   Have you Recently Had Thoughts About Hurting Someone [name]? No  Are You Planning to Harm Someone at This Time? No  Explanation: No data recorded  Have You Used Any Alcohol or Drugs in the Past 24 Hours? No  How Long Ago Did You Use Drugs or Alcohol? No data recorded What Did You Use and How Much? No data recorded  Do You Currently Have a Therapist/Psychiatrist? No  Name of Therapist/Psychiatrist: No data recorded  Have You Been Recently Discharged From Any Office Practice or Programs? No  Explanation of Discharge From Practice/Program: No data recorded   CCA Screening  Triage Referral Assessment Type of Contact: Face-to-Face  Telemedicine Service Delivery:   Is this Initial or Reassessment?   Date Telepsych consult ordered in [facility]:    Time Telepsych consult ordered in [facility]:    Location of Assessment: [facility] ED  Provider Location: [facility] ED    Collateral Involvement: [name], son   Does Patient Have a Automotive engineer Guardian? No data recorded Name and Contact of Legal Guardian: No data recorded If Minor and Not  Living with Parent(s), Who has Custody? No data recorded Is CPS involved or ever been involved? Never  Is APS involved or ever been involved? Never   Patient Determined To Be At Risk for Harm To Self or Others Based on Review of Patient Reported Information or Presenting Complaint? No  Method: No Plan  Availability of Means: No access or NA  Intent: Vague intent or NA  Notification Required: No need or identified person  Additional Information for Danger to Others Potential: No data recorded Additional Comments for Danger to Others Potential: No data recorded Are There Guns or Other Weapons in Your Home? No  Types of Guns/Weapons: No data recorded Are These Weapons Safely Secured?                            No data recorded Who Could Verify You Are Able To Have These Secured: No data recorded Do You Have any Outstanding Charges, Pending Court Dates, Parole/Probation? No data recorded Contacted To Inform of Risk of Harm To Self or Others: No data recorded  Does Patient Present under Involuntary Commitment? Yes    [place] of Residence:    Patient Currently Receiving the Following Services: Medication Management   Determination of Need: Emergent (2 hours)   Options For Referral: ED Visit; Medication Management   Discharge Disposition:     Clerance Lav, Counselor, LCAS-A

## 2023-04-24 NOTE — ED Notes (Signed)
Pt received dinner. 

## 2023-07-21 DIAGNOSIS — J42 Unspecified chronic bronchitis: Secondary | ICD-10-CM | POA: Diagnosis not present

## 2023-07-21 DIAGNOSIS — M5489 Other dorsalgia: Secondary | ICD-10-CM | POA: Diagnosis not present

## 2023-07-21 DIAGNOSIS — M549 Dorsalgia, unspecified: Secondary | ICD-10-CM | POA: Diagnosis not present

## 2023-07-21 DIAGNOSIS — J4489 Other specified chronic obstructive pulmonary disease: Secondary | ICD-10-CM | POA: Diagnosis not present

## 2023-07-21 DIAGNOSIS — R7309 Other abnormal glucose: Secondary | ICD-10-CM | POA: Diagnosis not present

## 2023-07-21 DIAGNOSIS — Z23 Encounter for immunization: Secondary | ICD-10-CM | POA: Diagnosis not present

## 2023-07-21 DIAGNOSIS — I2510 Atherosclerotic heart disease of native coronary artery without angina pectoris: Secondary | ICD-10-CM | POA: Diagnosis not present

## 2023-07-21 DIAGNOSIS — I25119 Atherosclerotic heart disease of native coronary artery with unspecified angina pectoris: Secondary | ICD-10-CM | POA: Diagnosis not present

## 2023-07-21 DIAGNOSIS — R6889 Other general symptoms and signs: Secondary | ICD-10-CM | POA: Diagnosis not present

## 2023-07-21 DIAGNOSIS — I10 Essential (primary) hypertension: Secondary | ICD-10-CM | POA: Diagnosis not present

## 2023-07-21 DIAGNOSIS — F03918 Unspecified dementia, unspecified severity, with other behavioral disturbance: Secondary | ICD-10-CM | POA: Diagnosis not present

## 2023-12-10 ENCOUNTER — Emergency Department
Admission: EM | Admit: 2023-12-10 | Discharge: 2023-12-10 | Disposition: A | Discharge status: Home / Self Care | Attending: Emergency Medicine | Admitting: Emergency Medicine

## 2023-12-10 ENCOUNTER — Other Ambulatory Visit: Payer: Self-pay

## 2023-12-10 DIAGNOSIS — Z711 Person with feared health complaint in whom no diagnosis is made: Secondary | ICD-10-CM | POA: Diagnosis not present

## 2023-12-10 DIAGNOSIS — R451 Restlessness and agitation: Secondary | ICD-10-CM | POA: Diagnosis present

## 2023-12-10 DIAGNOSIS — I2510 Atherosclerotic heart disease of native coronary artery without angina pectoris: Secondary | ICD-10-CM | POA: Diagnosis not present

## 2023-12-10 DIAGNOSIS — J449 Chronic obstructive pulmonary disease, unspecified: Secondary | ICD-10-CM | POA: Insufficient documentation

## 2023-12-10 DIAGNOSIS — E119 Type 2 diabetes mellitus without complications: Secondary | ICD-10-CM | POA: Diagnosis not present

## 2023-12-10 DIAGNOSIS — I10 Essential (primary) hypertension: Secondary | ICD-10-CM | POA: Diagnosis not present

## 2023-12-10 DIAGNOSIS — F0390 Unspecified dementia without behavioral disturbance: Secondary | ICD-10-CM | POA: Insufficient documentation
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
## 2023-12-10 NOTE — Discharge Instructions (Signed)
 Follow-up with your primary care provider if any continued problems and continue with your regular medication.
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
## 2023-12-11 ENCOUNTER — Emergency Department
Admission: EM | Admit: 2023-12-11 | Discharge: 2024-01-04 | Disposition: A | Discharge status: Other Acute Inpatient Hospital | Attending: Emergency Medicine | Admitting: Emergency Medicine

## 2023-12-11 ENCOUNTER — Other Ambulatory Visit: Payer: Self-pay

## 2023-12-11 DIAGNOSIS — Z79899 Other long term (current) drug therapy: Secondary | ICD-10-CM | POA: Diagnosis not present

## 2023-12-11 DIAGNOSIS — F02811 Dementia in other diseases classified elsewhere, unspecified severity, with agitation: Secondary | ICD-10-CM | POA: Diagnosis not present

## 2023-12-11 DIAGNOSIS — R2689 Other abnormalities of gait and mobility: Secondary | ICD-10-CM | POA: Insufficient documentation

## 2023-12-11 DIAGNOSIS — R451 Restlessness and agitation: Secondary | ICD-10-CM | POA: Diagnosis present

## 2023-12-11 DIAGNOSIS — G309 Alzheimer's disease, unspecified: Secondary | ICD-10-CM | POA: Diagnosis not present

## 2023-12-11 DIAGNOSIS — F03918 Unspecified dementia, unspecified severity, with other behavioral disturbance: Secondary | ICD-10-CM | POA: Diagnosis present

## 2023-12-11 LAB — CBC WITH DIFFERENTIAL/PLATELET
Abs Immature Granulocytes: 0.01 10*3/uL (ref 0.00–0.07)
Basophils Absolute: 0 10*3/uL (ref 0.0–0.1)
Basophils Relative: 1 %
Eosinophils Absolute: 0 10*3/uL (ref 0.0–0.5)
Eosinophils Relative: 1 %
HCT: 41 % (ref 36.0–46.0)
Hemoglobin: 13.1 g/dL (ref 12.0–15.0)
Immature Granulocytes: 0 %
Lymphocytes Relative: 29 %
Lymphs Abs: 2 10*3/uL (ref 0.7–4.0)
MCH: 28.7 pg (ref 26.0–34.0)
MCHC: 32 g/dL (ref 30.0–36.0)
MCV: 89.9 fL (ref 80.0–100.0)
Monocytes Absolute: 0.9 10*3/uL (ref 0.1–1.0)
Monocytes Relative: 13 %
Neutro Abs: 3.9 10*3/uL (ref 1.7–7.7)
Neutrophils Relative %: 56 %
Platelets: 276 10*3/uL (ref 150–400)
RBC: 4.56 MIL/uL (ref 3.87–5.11)
RDW: 14.4 % (ref 11.5–15.5)
WBC: 6.9 10*3/uL (ref 4.0–10.5)
nRBC: 0 % (ref 0.0–0.2)

## 2023-12-11 LAB — COMPREHENSIVE METABOLIC PANEL WITH GFR
ALT: 17 U/L (ref 0–44)
AST: 29 U/L (ref 15–41)
Albumin: 3.9 g/dL (ref 3.5–5.0)
Alkaline Phosphatase: 59 U/L (ref 38–126)
Anion gap: 13 (ref 5–15)
BUN: 9 mg/dL (ref 8–23)
CO2: 22 mmol/L (ref 22–32)
Calcium: 9.9 mg/dL (ref 8.9–10.3)
Chloride: 102 mmol/L (ref 98–111)
Creatinine, Ser: 0.89 mg/dL (ref 0.44–1.00)
GFR, Estimated: >60 mL/min (ref >60)
Glucose, Bld: 108 mg/dL — ABNORMAL HIGH (ref 70–99)
Potassium: 3.1 mmol/L — ABNORMAL LOW (ref 3.5–5.1)
Sodium: 137 mmol/L (ref 135–145)
Total Bilirubin: 0.9 mg/dL (ref 0.0–1.2)
Total Protein: 7.9 g/dL (ref 6.5–8.1)

## 2023-12-11 LAB — URINALYSIS, ROUTINE W REFLEX MICROSCOPIC
Bilirubin Urine: NEGATIVE
Glucose, UA: NEGATIVE mg/dL
Hgb urine dipstick: NEGATIVE
Ketones, ur: NEGATIVE mg/dL
Leukocytes,Ua: NEGATIVE
Nitrite: NEGATIVE
Protein, ur: NEGATIVE mg/dL
Specific Gravity, Urine: 1.02 (ref 1.005–1.030)
pH: 5 (ref 5.0–8.0)

## 2023-12-11 LAB — URINE DRUG SCREEN, QUALITATIVE (ARMC ONLY)
Amphetamines, Ur Screen: NOT DETECTED
Barbiturates, Ur Screen: NOT DETECTED
Benzodiazepine, Ur Scrn: NOT DETECTED
Cannabinoid 50 Ng, Ur ~~LOC~~: NOT DETECTED
Cocaine Metabolite,Ur ~~LOC~~: NOT DETECTED
MDMA (Ecstasy)Ur Screen: NOT DETECTED
Methadone Scn, Ur: NOT DETECTED
Opiate, Ur Screen: NOT DETECTED
Phencyclidine (PCP) Ur S: NOT DETECTED
Tricyclic, Ur Screen: NOT DETECTED

## 2023-12-11 LAB — ETHANOL: Alcohol, Ethyl (B): <15 mg/dL (ref <15)

## 2023-12-11 NOTE — ED Notes (Signed)
 PT  VOL

## 2023-12-11 NOTE — ED Notes (Signed)
 Pt assisted to bathroom and back by EDT

## 2023-12-11 NOTE — ED Notes (Signed)
 Pt pulled up in bed by RN Satira Curet and Gregorio Lease. Pt provided with lunch tray.

## 2023-12-11 NOTE — ED Notes (Signed)
 Pt given breakfast tray

## 2023-12-11 NOTE — BH Assessment (Signed)
 Writer attempted to call patient's son, [name] [phone] but no answer and not able to leave message because mailbox is full.
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
## 2023-12-11 NOTE — ED Notes (Signed)
 Pt assisted to toilet with cane. Pt ambulatory with one assist. Pt was able to provide urine sample. Pt provided with wash cloths and towels. Pt gave self bird bath and this RN assisted pt with clean briefs and gown. Pt ambulatory back to stretcher.

## 2023-12-11 NOTE — ED Notes (Signed)
 Pt speaking with son [name] on the phone at this time.

## 2023-12-11 NOTE — ED Notes (Signed)
Family visiting with pt

## 2023-12-11 NOTE — ED Provider Notes (Signed)
 [facility] Provider Note    Event Date/Time   First MD Initiated Contact with Patient 12/11/23 [phone]     (approximate)   History   Psychiatric Evaluation   HPI  Penny Hart is a 78 year old female with history of Alzheimer's and vascular dementia with severe behavioral disturbances presenting to the emergency department for evaluation of agitation.  Per report, son called PD as the patient was standing on a kitchen table swinging her cane at home.  Patient reportedly was calm for EMS.  Patient was seen in our ER yesterday with concerns for agitation.  Was noted to be calm and cooperative here.  At that time, collateral was obtained from patient's granddaughter who noted that the patient was at her baseline and that the patient's son was not supposed to be on the patient's property.  She was discharged at that time.      Physical Exam   Triage Vital Signs: ED Triage Vitals  Encounter Vitals Group     BP 12/11/23 0625 (!) 115/96     Systolic BP Percentile --      Diastolic BP Percentile --      Pulse Rate 12/11/23 0625 78     Resp 12/11/23 0625 17     Temp 12/11/23 0625 97.9 F (36.6 C)     Temp Source 12/11/23 0625 Oral     SpO2 12/11/23 0625 96 %     Weight 12/11/23 0624 169 lb 12.1 oz (77 kg)     Height 12/11/23 0624 5\' 5"  (1.651 m)     Head Circumference --      Peak Flow --      Pain Score --      Pain Loc --      Pain Education --      Exclude from Growth Chart --     Most recent vital signs: Vitals:   12/11/23 0625  BP: (!) 115/96  Pulse: 78  Resp: 17  Temp: 97.9 F (36.6 C)  SpO2: 96%     General: Awake, interactive  CV:  Regular rate, good peripheral perfusion.  Resp:  Unlabored respirations.  Abd:  Nondistended.  Neuro:  Symmetric facial movement, fluid speech   ED Results / Procedures / Treatments   Labs (all labs ordered are listed, but only abnormal results are displayed) Labs Reviewed  CBC WITH  DIFFERENTIAL/PLATELET  COMPREHENSIVE METABOLIC PANEL WITH GFR  URINE DRUG SCREEN, QUALITATIVE ([facility] ONLY)  ETHANOL     EKG EKG independently reviewed interpreted by myself (ER attending) demonstrates:    RADIOLOGY Imaging independently reviewed and interpreted by myself demonstrates:   Formal Radiology Read:  No results found.  PROCEDURES:  Critical Care performed: No  Procedures   MEDICATIONS ORDERED IN ED: Medications - No data to display   IMPRESSION / MDM / ASSESSMENT AND PLAN / ED COURSE  I reviewed the triage vital signs and the nursing notes.  Differential diagnosis includes, but is not limited to, agitation related to her known neurocognitive disorder, primary psychiatric disorder, substance-induced mood disorder, acute stress response  Patient's presentation is most consistent with acute presentation with potential threat to life or bodily function.  78 year old female presenting with agitation.  Calm and cooperative here.  No reported SI or HI.  Will consult psychiatry and TTS.  No indication for IVC currently.   The patient has been placed in psychiatric observation due to the need to provide a safe environment for the patient while obtaining  psychiatric consultation and evaluation, as well as ongoing medical and medication management to treat the patient's condition.  The patient has not been placed under full IVC at this time.     FINAL CLINICAL IMPRESSION(S) / ED DIAGNOSES   Final diagnoses:  Agitation     Rx / DC Orders   ED Discharge Orders     None        Note:  This document was prepared using Dragon voice recognition software and may include unintentional dictation errors.   Claria Crofts, MD 12/11/23 [phone]

## 2023-12-11 NOTE — ED Notes (Signed)
 Pt given supper tray and eating at this time.

## 2023-12-11 NOTE — BH Assessment (Signed)
 IRIS request has been placed for this patient to be seen.

## 2023-12-11 NOTE — ED Notes (Signed)
 Son called to check on pt. Pt resting and not available to talk with son. Son updated
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
## 2023-12-11 NOTE — ED Triage Notes (Signed)
 BIB ems for psych eval  Per ems "son said that when he got up this morning she was standing on the kitchen table swinging her cane at him. She has been calm and cooperative for leo and us ."  Pt was seen here yesterday for the same

## 2023-12-11 NOTE — BH Assessment (Signed)
 Comprehensive Clinical Assessment (CCA) Screening, Triage and Referral Note  12/11/2023 Penny Hart [id number]  Fredric Jean, 78 year old female who presents to [facility] ED voluntarily for treatment. Per triage note, BIB EMS for psych eval. Per EMS "son said that when he got up this morning, she was standing on the kitchen table swinging her cane at him. She has been calm and cooperative for LEO and us ." Pt was seen here yesterday for the same.    During TTS assessment pt presents alert and oriented x 2, restless but cooperative, and mood-congruent with affect. The pt does not appear to be responding to internal or external stimuli. Neither is the pt presenting with any delusional thinking. Pt verified the information provided to triage RN.   Pt identifies her main complaint to be that she is not sure why she was brought to the ED. Patient states she does not remember what happened but says her son keeps saying that she was sick. Patient reports she lives alone but her son and daughter are over constantly making sure she is okay. Patient states she can cook her own meals, bathe herself and sometimes is able to shop(buy groceries) on her own. Patient is calm, cooperative and pleasant. Pt reports a medical hx of diabetes, "heart issues" and hypertension. Patient reports she is compliant with her medications. Pt denies current SI/HI/AH/VH.    Writer attempted to contact both Graig Lawyer [phone](patient's son) and Marleta Simmer [phone]([id number] granddaughter)- No answer   Per Tyra Galley, NP pt does not meet criteria for inpatient psychiatric admission.    Chief Complaint:  Chief Complaint  Patient presents with   Psychiatric Evaluation   Visit Diagnosis: Major neurocognitive disorder with behavioral disturbance  Patient Reported Information How did you hear about us ? -- (EMS)  What Is the Reason for Your Visit/Call Today? Patient reports she is not sure why she was brought to the ED.  Patient presents with some confusion.  How Long Has This Been Causing You Problems? > than 6 months  What Do You Feel Would Help You the Most Today? Medication(s)   Have You Recently Had Any Thoughts About Hurting Yourself? No  Are You Planning to Commit Suicide/Harm Yourself At This time? No   Have you Recently Had Thoughts About Hurting Someone Penny Hart? No  Are You Planning to Harm Someone at This Time? No  Explanation: No data recorded  Have You Used Any Alcohol or Drugs in the Past 24 Hours? No  How Long Ago Did You Use Drugs or Alcohol? No data recorded What Did You Use and How Much? No data recorded  Do You Currently Have a Therapist/Psychiatrist? No  Name of Therapist/Psychiatrist: No data recorded  Have You Been Recently Discharged From Any Office Practice or Programs? No  Explanation of Discharge From Practice/Program: No data recorded   CCA Screening Triage Referral Assessment Type of Contact: Face-to-Face  Telemedicine Service Delivery:   Is this Initial or Reassessment?   Date Telepsych consult ordered in [facility]:    Time Telepsych consult ordered in [facility]:    Location of Assessment: [facility] ED  Provider Location: [facility] ED    Collateral Involvement: Penny Hart, son   Does Patient Have a Automotive engineer Guardian? No data recorded Name and Contact of Legal Guardian: No data recorded If Minor and Not Living with Parent(s), Who has Custody? No data recorded Is CPS involved or ever been involved? Never  Is APS involved or ever been involved? Never  Patient Determined To Be At Risk for Harm To Self or Others Based on Review of Patient Reported Information or Presenting Complaint? No  Method: No Plan  Availability of Means: No access or NA  Intent: Vague intent or NA  Notification Required: No need or identified person  Additional Information for Danger to Others Potential: No data recorded Additional Comments for Danger to Others Potential: No data  recorded Are There Guns or Other Weapons in Your Home? No  Types of Guns/Weapons: No data recorded Are These Weapons Safely Secured?                            No data recorded Who Could Verify You Are Able To Have These Secured: No data recorded Do You Have any Outstanding Charges, Pending Court Dates, Parole/Probation? No data recorded Contacted To Inform of Risk of Harm To Self or Others: No data recorded  Does Patient Present under Involuntary Commitment? No    [place] of Residence: [place]   Patient Currently Receiving the Following Services: Medication Management   Determination of Need: Emergent (2 hours)   Options For Referral: ED Visit; Medication Management   Disposition Recommendation per psychiatric provider: Per Tyra Galley, NP pt does not meet criteria for inpatient psychiatric admission.    Dare Spillman Willian Harrow, Counselor, LCAS-A

## 2023-12-12 MED ORDER — LORAZEPAM 2 MG/ML IJ SOLN
1.0000 mg | Freq: Once | INTRAMUSCULAR | Status: AC
  Administered 2023-12-12: 1 mg via INTRAMUSCULAR
  Filled 2023-12-12: qty 1

## 2023-12-12 NOTE — Progress Notes (Signed)
   12/12/23 1330  Spiritual Encounters  Type of Visit Follow up  Care provided to: Family  Reason for visit Routine spiritual support  OnCall Visit No   Chaplain followed up to see patient because she was agitated earlier.  Chaplain met patient's daughter who was overwhelmed by her mother's condition.  Daughter was waiting to speak to the Case Worker.  Chaplain offered a compassionate presence and reflective listening.  Chaplain assisted daughter by organizing mother's paperwork.  Chaplain prayed with patient at patient's request.     Rev. Rana M. Nolon Baxter, M.Div. Chaplain Resident [facility]
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
## 2023-12-12 NOTE — ED Notes (Signed)
 Patient extremely agitated, repeating questions of why is she here and where is her family. Patient trying to walk out of ED, security at patient's side. Patient refusing to get back in bed. Called granddaughter in hopes she could ease patient via phone, no answer but left voicemail. MD Hendrick Locke made aware.

## 2023-12-12 NOTE — BH Assessment (Signed)
 Received call from patients son [name], stating he will pick up patient on tomorrow 12/13/23, and wants to make sure she has been given Seroquel  prior to discharge. He reports looking into memory care facilities, but would like to consult with social worker for additional resources.

## 2023-12-12 NOTE — ED Notes (Addendum)
 Daughter at bedside, and requesting to talk to a social worker about plan of care. All appointment details and ARPA paperwork were given to pt's daughter Penny Hart.

## 2023-12-12 NOTE — ED Notes (Addendum)
 Per Gaynelle Keeling, NP, pt was psych cleared and will need resources for an outpatient psych follow-up.  [facility] spoke to Cain Castillo at [facility] [phone]) and scheduled an outpatient psych follow-up appointment for med management on Thursday, May 29th at 1:30PM.  Patient should arrive at [facility].    Cain Castillo sent over paperwork for patient to complete prior to appointment.  [facility] printed paperwork along with a hard copy of the appointment details and hand delivered to ED.  Patient is sleeping. All appointment details and ARPA  paperwork were given to pt's nurse, Betty Bruckner, RN.  [place], [facility] [phone]

## 2023-12-12 NOTE — Progress Notes (Signed)
   12/12/23 1100  Spiritual Encounters  Type of Visit Initial  Care provided to: Patient  Conversation partners present during encounter Nurse  Reason for visit Routine spiritual support  OnCall Visit No   Chaplain tried to engage patient while patient was sitting on the bed; however, patient was agitated so, security and staff provided care.  Chaplain will visit patient another time.    Rev. Rana M. Nolon Baxter, M.Div. Chaplain Resident [facility]

## 2023-12-12 NOTE — NC FL2 (Signed)
 [place]  MEDICAID FL2 LEVEL OF CARE FORM     IDENTIFICATION  Patient Name: SHANNIE KONTOS Birthdate: 02-15-1946 Sex: female Admission Date (Current Location): 12/11/2023  [facility] and [place] Number:  Chiropodist and Address:  [facility], [address], [place], [place] [address]      Provider Number: [id number]  Attending Physician Name and Address:  No att. 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   Surgery, elective    Chronic diastolic congestive heart failure (HCC)    Chronic obstructive pulmonary disease (HCC)    Diabetes mellitus type 2 in nonobese (HCC)    Benign essential HTN    Post-operative pain    Hyponatremia    Leukocytosis    Spondylolisthesis of lumbar region 04/27/2017   Blood in stool    Hiatal hernia    Benign neoplasm of cecum    Benign neoplasm of transverse colon    Diverticulosis of large intestine without diverticulitis    Rectal bleeding 05/29/2015   Non-intractable cyclical vomiting with nausea     Orientation RESPIRATION BLADDER Height & Weight     Self  Normal Continent Weight: 77 kg Height:  5\' 5"  (165.1 cm)  BEHAVIORAL SYMPTOMS/MOOD NEUROLOGICAL BOWEL NUTRITION STATUS  Wanderer (She attempted to leave the ED)  (N/A) Continent Diet (Regular diet, thin liquids)  AMBULATORY STATUS COMMUNICATION OF NEEDS Skin   Limited Assist Verbally Normal                       Personal Care Assistance Level of Assistance  Bathing, Feeding, Dressing Bathing Assistance: Maximum assistance Feeding assistance: Limited assistance Dressing Assistance: Maximum  assistance     Functional Limitations Info  Speech     Speech Info: Impaired (due to dementia)    SPECIAL CARE FACTORS FREQUENCY   (N/A)                    Contractures Contractures Info: Not present    Additional Factors Info   (N/A)               Current Medications (12/12/2023):  This is the current hospital active medication list No current facility-administered medications for this encounter.   Current Outpatient Medications  Medication Sig Dispense Refill   aspirin  EC 81 MG tablet Take 81 mg by mouth daily. Swallow whole.     dorzolamide -timolol  (COSOPT ) 2-0.5 % ophthalmic solution Place 1 drop into the left eye 2 (two) times daily.     isosorbide  mononitrate (IMDUR ) 30 MG 24 hr tablet Take 30 mg by mouth daily.     lamoTRIgine  (LAMICTAL ) 25 MG tablet Take 25 mg by mouth 2 (two) times daily.     latanoprost (XALATAN) 0.005 % ophthalmic solution Place 1 drop into the left eye at bedtime.     metoprolol  succinate (TOPROL -XL) 25 MG 24 hr tablet Take 25 mg by mouth daily.     mirtazapine  (REMERON ) 15 MG tablet Take 1 tablet (15 mg total) by mouth at bedtime. 30 tablet 0   potassium chloride  (KLOR-CON  M) 10 MEQ tablet Take 1 tablet by  mouth 2 (two) times daily.     pravastatin  (PRAVACHOL ) 10 MG tablet Take 1 tablet by mouth every evening.     QUEtiapine  (SEROQUEL ) 50 MG tablet Take 1 tablet (50 mg total) by mouth in the morning. 30 tablet 0   torsemide  (DEMADEX ) 20 MG tablet Take 20 mg by mouth daily.     erythromycin  ophthalmic ointment Place 1 Application into the right eye 4 (four) times daily. (Patient not taking: Reported on 12/11/2023) 3.5 g 0     Discharge Medications: Please see discharge summary for a list of discharge medications.  Relevant Imaging Results:  Relevant Lab Results:   Additional Information ss# [id number]       Gillermo Lack RNCM [phone]  Arminda Landmark, RN

## 2023-12-12 NOTE — ED Notes (Signed)
 Pt taken to bathroom and returned back to bed at this time

## 2023-12-12 NOTE — Group Note (Deleted)
 Date:  12/12/2023 Time:  9:45 PM  Group Topic/Focus:  Wrap-Up Group:   The focus of this group is to help patients review their daily goal of treatment and discuss progress on daily workbooks.     Participation Level:  {BHH PARTICIPATION EAVWU:98119}  Participation Quality:  {BHH PARTICIPATION QUALITY:22265}  Affect:  {BHH AFFECT:22266}  Cognitive:  {BHH COGNITIVE:22267}  Insight: {BHH Insight2:20797}  Engagement in Group:  {BHH ENGAGEMENT IN JYNWG:95621}  Modes of Intervention:  {BHH MODES OF INTERVENTION:22269}  Additional Comments:  ***  Maglione,Preciosa Bundrick E 12/12/2023, 9:45 PM
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
## 2023-12-12 NOTE — ED Notes (Signed)
 Patient questioning why she is in the hospital and requesting to speak to son. Son was called and this RN left a voicemail for a return call. Patient made aware.

## 2023-12-12 NOTE — ED Notes (Signed)
 Vol needs higher level of care than at home   per NP

## 2023-12-13 MED ORDER — HALOPERIDOL LACTATE 5 MG/ML IJ SOLN
2.0000 mg | Freq: Once | INTRAMUSCULAR | Status: AC
  Administered 2023-12-13: 2 mg via INTRAMUSCULAR
  Filled 2023-12-13: qty 1

## 2023-12-13 MED ORDER — LAMOTRIGINE 25 MG PO TABS
25.0000 mg | ORAL_TABLET | Freq: Two times a day (BID) | ORAL | Status: DC
  Administered 2023-12-13 – 2023-12-26 (×25): 25 mg via ORAL
  Filled 2023-12-13 (×27): qty 1

## 2023-12-13 MED ORDER — MIRTAZAPINE 15 MG PO TABS
15.0000 mg | ORAL_TABLET | Freq: Every day | ORAL | Status: DC
  Administered 2023-12-13 – 2024-01-03 (×21): 15 mg via ORAL
  Filled 2023-12-13 (×22): qty 1

## 2023-12-13 MED ORDER — POTASSIUM CHLORIDE CRYS ER 20 MEQ PO TBCR
10.0000 meq | EXTENDED_RELEASE_TABLET | Freq: Two times a day (BID) | ORAL | Status: DC
  Administered 2023-12-13 – 2024-01-04 (×43): 10 meq via ORAL
  Filled 2023-12-13 (×42): qty 1

## 2023-12-13 MED ORDER — DORZOLAMIDE HCL-TIMOLOL MAL 2-0.5 % OP SOLN
1.0000 [drp] | Freq: Two times a day (BID) | OPHTHALMIC | Status: DC
  Administered 2023-12-14 – 2024-01-04 (×38): 1 [drp] via OPHTHALMIC
  Filled 2023-12-13 (×3): qty 10

## 2023-12-13 MED ORDER — METOPROLOL SUCCINATE ER 25 MG PO TB24
25.0000 mg | ORAL_TABLET | Freq: Every day | ORAL | Status: DC
  Administered 2023-12-14 – 2024-01-04 (×18): 25 mg via ORAL
  Filled 2023-12-13 (×21): qty 1

## 2023-12-13 MED ORDER — QUETIAPINE FUMARATE 25 MG PO TABS
150.0000 mg | ORAL_TABLET | Freq: Once | ORAL | Status: AC
  Administered 2023-12-13: 150 mg via ORAL
  Filled 2023-12-13: qty 6

## 2023-12-13 MED ORDER — LORAZEPAM 2 MG/ML IJ SOLN
1.0000 mg | Freq: Once | INTRAMUSCULAR | Status: AC
  Administered 2023-12-13: 1 mg via INTRAMUSCULAR
  Filled 2023-12-13: qty 1

## 2023-12-13 MED ORDER — ASPIRIN 81 MG PO TBEC
81.0000 mg | DELAYED_RELEASE_TABLET | Freq: Every day | ORAL | Status: DC
Start: 2023-12-13 — End: 2024-01-04
  Administered 2023-12-13 – 2024-01-04 (×23): 81 mg via ORAL
  Filled 2023-12-13 (×23): qty 1

## 2023-12-13 MED ORDER — QUETIAPINE FUMARATE 25 MG PO TABS
50.0000 mg | ORAL_TABLET | Freq: Every morning | ORAL | Status: DC
  Administered 2023-12-14 – 2023-12-21 (×7): 50 mg via ORAL
  Filled 2023-12-13 (×7): qty 2

## 2023-12-13 MED ORDER — ISOSORBIDE MONONITRATE ER 60 MG PO TB24
30.0000 mg | ORAL_TABLET | Freq: Every day | ORAL | Status: DC
Start: 2023-12-13 — End: 2024-01-04
  Administered 2023-12-13 – 2024-01-04 (×22): 30 mg via ORAL
  Filled 2023-12-13 (×22): qty 1

## 2023-12-13 MED ORDER — LATANOPROST 0.005 % OP SOLN
1.0000 [drp] | Freq: Every day | OPHTHALMIC | Status: DC
Start: 2023-12-13 — End: 2024-01-04
  Administered 2023-12-14 – 2024-01-03 (×17): 1 [drp] via OPHTHALMIC
  Filled 2023-12-13 (×2): qty 2.5

## 2023-12-13 MED ORDER — PRAVASTATIN SODIUM 20 MG PO TABS
10.0000 mg | ORAL_TABLET | Freq: Every evening | ORAL | Status: DC
  Administered 2023-12-13 – 2024-01-03 (×17): 10 mg via ORAL
  Filled 2023-12-13 (×17): qty 1

## 2023-12-13 MED ORDER — TORSEMIDE 20 MG PO TABS
20.0000 mg | ORAL_TABLET | Freq: Every day | ORAL | Status: DC
  Administered 2023-12-13 – 2024-01-04 (×23): 20 mg via ORAL
  Filled 2023-12-13 (×24): qty 1

## 2023-12-13 NOTE — ED Notes (Signed)
PT at bedside working with pt.

## 2023-12-13 NOTE — ED Notes (Signed)
 Vol/ TOC pending placement
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
## 2023-12-13 NOTE — ED Notes (Signed)
 Pt breathing even and unlabored, resting. No needs at this time.
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
## 2023-12-13 NOTE — ED Notes (Signed)
Patient's family member at bedside. 

## 2023-12-13 NOTE — ED Notes (Signed)
 Pt given a dinner tray

## 2023-12-13 NOTE — ED Notes (Signed)
 Pt given a breakfast tray

## 2023-12-13 NOTE — ED Provider Notes (Signed)
 Emergency Medicine Observation Re-evaluation Note  Penny Hart is a 78 y.o. female, seen on rounds today.  Pt initially presented to the ED for complaints of Psychiatric Evaluation Currently, the patient is resting, voices no medical complaints.  Physical Exam  BP 128/70   Pulse 84   Temp 97.8 F (36.6 C) (Oral)   Resp 18   Ht 5\' 5"  (1.651 m)   Wt 77 kg   SpO2 98%   BMI 28.25 kg/m  Physical Exam General: Resting in no acute distress Cardiac: No cyanosis Lungs: Equal rise and fall Psych: Not agitated  ED Course / MDM  EKG:   I have reviewed the labs performed to date as well as medications administered while in observation.  Recent changes in the last 24 hours include no events overnight.  Plan  Current plan is for psychiatric disposition.    Graceland Wachter J, MD 12/13/23 [phone]

## 2023-12-13 NOTE — ED Notes (Signed)
 This RN assisted pt to the bathroom and back to bed. Pt used cane, with unsteady and uneven gait. This RN provided minimal assist.

## 2023-12-13 NOTE — ED Notes (Signed)
 Pt completed 75% of her tray, pt given vanilla ice cream.

## 2023-12-13 NOTE — ED Notes (Signed)
 Pt yelling on hallway looking for house keys, present nurse attempt to reorient.

## 2023-12-13 NOTE — ED Notes (Signed)
Pt's family member at the bedside.

## 2023-12-13 NOTE — ED Notes (Signed)
 Pt agitated about not having a wallet. Pt yelling out in hallway.
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
## 2023-12-13 NOTE — ED Notes (Signed)
 Pt screaming about snakes on the floor, present RN attempt to reorient.

## 2023-12-14 MED ORDER — HALOPERIDOL 2 MG PO TABS
2.0000 mg | ORAL_TABLET | Freq: Once | ORAL | Status: AC
  Administered 2023-12-14: 2 mg via ORAL
  Filled 2023-12-14: qty 1

## 2023-12-14 NOTE — ED Notes (Signed)
 Vol/Toc/pending placement.

## 2023-12-14 NOTE — ED Notes (Signed)
Pt was helped to the bathroom to void 

## 2023-12-14 NOTE — ED Notes (Signed)
 Pt received breakfast lunch and dinner.  She drank several orange juices and she had a bath and was helped to the bathroom to void.  Pt redirectable and also ambulated in hall with staff

## 2023-12-14 NOTE — ED Notes (Signed)
 Helped pt sit up, gave her the breakfast tray, cut up pancake and sausage pattie and gave pt a orange juice and a water.  Pt appears in no distress at this time

## 2023-12-14 NOTE — ED Notes (Signed)
 Pt was given a wash up assisted by myself. Pt given a clean gown and socks now resting calmly in bed

## 2023-12-14 NOTE — ED Notes (Signed)
 Pt continuous to act impulsive, does not follow instructions and requires frequent reorientation. Peggi Bowels Md notified

## 2023-12-14 NOTE — ED Notes (Signed)
Patient given two warm blankets.

## 2023-12-14 NOTE — ED Notes (Signed)
 Patient continues to scream for "[name]."  Attempting to get in and out of bed and walk down hallway.  Redirected multiple times and returned to bed.  Patient remains oriented x 1.

## 2023-12-14 NOTE — TOC Progression Note (Signed)
 Transition of Care [facility]) - Progression Note    Patient Details  Name: Penny Hart MRN: [id number] Date of Birth: 07/05/1946  [facility] CM/SW Contact  Zoe Hinds, RN Phone Number: 12/14/2023, 4:20 PM  Clinical Narrative:    This CM called and spoke with pt's DSS worker Deedra Farr @[phone] to discuss updates for pt's  care coordination/ dc planning  and was informed pt's daughter Laury Portela @[phone] and granddaughter Latoya @[phone] are currently speaking on pt's behalf . This CM informed Pt's son that was residing with her prior to this admission  is not to speak on pt's behalf  per DSS SW.  Ammon Bales informed  Pt has another son named Andy Bannister who has an intellectual disability and unable to speak on pt's behalf . This CM attempted to speak with Ms. Thaxton and Latoya regarding pt's DC plan to start care coordination  at which time ,they both expressed concerns regarding any financial obligation they may be responsible for , sharing they would like to speak with DSS SW to get more information about continuing  to speak on pt's behalf. TOC will cont to follow dc planning / care coordination and update as applicable.   Expected Discharge Plan: Skilled Nursing Facility Barriers to Discharge: Continued Medical Work up  Expected Discharge Plan and Services   Discharge Planning Services: CM Consult   Living arrangements for the past 2 months: Apartment                                       Social Determinants of Health (SDOH) Interventions SDOH Screenings   Housing: Unknown (08/01/2023)   Received from [facility]  Transportation Needs: No Transportation Needs (03/10/2023)  Tobacco Use: Medium Risk (12/11/2023)    Readmission Risk Interventions     No data to display

## 2023-12-14 NOTE — Progress Notes (Signed)
 Mobility Specialist - Progress Note    12/14/23 1717  Mobility  Activity Ambulated with assistance in hallway  Level of Assistance Contact guard assist, steadying assist  Assistive Device Cane  Distance Ambulated (ft) 25 ft  Range of Motion/Exercises Active  Activity Response Tolerated well  Mobility Referral Yes  Mobility visit 1 Mobility  Mobility Specialist Start Time (ACUTE ONLY) 1701  Mobility Specialist Stop Time (ACUTE ONLY) 1717  Mobility Specialist Time Calculation (min) (ACUTE ONLY) 16 min   Pt resting in bed on RA upon entry. Pt STS and ambulates to hallway beside NS CGA with cane and hands on to prevent walking. Pt endorses pain in right knee. Pt maintains upright walking mechanics and returned to bed. Pt left in bed with needs in reach in view of ED staff.   Jerri Morale Mobility Specialist 12/14/23, 5:20 PM

## 2023-12-14 NOTE — ED Notes (Signed)
 Call to her daughter Ammon Bales as she had requested update.

## 2023-12-14 NOTE — ED Provider Notes (Addendum)
-----------------------------------------   4:39 AM on 12/14/2023 -----------------------------------------   Blood pressure 135/71, pulse (!) 58, temperature 98.2 F (36.8 C), resp. rate 17, height 5\' 5"  (1.651 m), weight 77 kg, SpO2 93%.  The patient is calm and cooperative at this time.  There have been no acute events since the last update.  Awaiting disposition plan from case management/social work.    Brooklyne Radke, Clover Dao, DO 12/14/23 4259   Due to continued agitation, previous provider Dr. Peggi Bowels reconsulted psychiatry for recommendations for medication management.   Hattie Aguinaldo, Clover Dao, DO 12/14/23 [phone]

## 2023-12-14 NOTE — ED Notes (Signed)
 Pt is sleeping, breathing even and unlabored.  Will medicate and recheck VS once pt is awake

## 2023-12-14 NOTE — ED Notes (Signed)
 VOLUNTARY continues to await disposition

## 2023-12-15 NOTE — ED Notes (Signed)
Patient received dinner tray 

## 2023-12-15 NOTE — Progress Notes (Deleted)
 Psychiatric Initial Adult Assessment   Patient Identification: Penny Hart MRN:  [id number] Date of Evaluation:  12/15/2023 Referral Source: *** Chief Complaint:  No chief complaint on file.  Visit Diagnosis: No diagnosis found.  History of Present Illness:   Penny Hart is a 78 y.o. year old female with a history of alzheimers and vascular dementia with severe behavioral disturbances, COPD, CAD, type II DM, hypertension, osteoarthricis, who is referred for the same.   Per chart review, she presented to ED for evaluation of agitation. Per report, son called PD as the patient was standing on a kitchen table swinging her cane at home.   Per chart review, she was seen by Dr. Mason Sole 07/2023  1. 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 Patient has two other sons. One, who was initial live-in caretaker for patient has moved to [place] . Daughter reports the patient has called police on this son who lived with patient as patient did not want her son to have company in her home. Daughter states patient was accusing son, who now lives in [place], of stealing from her. Prior to increase in Seroquel , patient would be up through the night going through her clothes until 4 to 5am. Increased seroquel  has been helpful. Daughter is very tearful. Notes she takes patient to her appointments. Daughter is managing patient medications. Has a camera in the home. Denies safety concern with current son residing with patient. Current son is cooking, cleaning But unable to manage medications. Notes she feels more comfortable with patient residing with this son.   MRI Brain without contrast with NeuroQuant 04/11/2022 -  Volumetric analysis of the brain was performed, with a fully detailed report in [facility]. Briefly, the comparison with age and gender matched reference reveals generalized cortical gray matter volume loss with relative preservation of white matter. Hippocampal volume loss is present in addition. No acute or subacute insult. Generalized brain volume loss with mild temporal lobe predominance. Mild chronic small-vessel ischemic change of the cerebral hemispheric white matter, similar to last year's exam.    Symptoms have worsened over the   Discontinued donepezil  secondary to bradycardia Continue Mirtazapine  7.5 mg  Continue Seroquel  (Quetiapine ) 50 mg by mouth once a day (daily) per primary care provider  Continue Lamotrigine  50 mg by mouth at night     Discontinued donepezil  secondary to bradycardia Continue Mirtazapine  7.5 mg  Continue Seroquel  (Quetiapine ) 50 mg by mouth once a day (daily) per primary care provider  Continue Lamotrigine  50 mg by mouth at night   Medication- donepezil  5 mg daily, lamotrigine  25 mg twice a day, mirtazapine  15 mg at night, quetiapine  150 mg at night, 50 mg in am  Associated Signs/Symptoms: Depression Symptoms:  {DEPRESSION SYMPTOMS:20000} (Hypo) Manic Symptoms:  {BHH MANIC SYMPTOMS:22872} Anxiety Symptoms:  {BHH ANXIETY SYMPTOMS:22873} Psychotic Symptoms:  {BHH PSYCHOTIC SYMPTOMS:22874} PTSD Symptoms: {BHH PTSD SYMPTOMS:22875}  Past Psychiatric History:  Outpatient:  Psychiatry admission:  Previous suicide attempt:  Past trials of medication:  History of violence:  History of head injury:   Previous Psychotropic Medications: {YES/NO:21197}  Substance Abuse History in the last 12 months:  {yes no:314532}  Consequences of Substance Abuse: {BHH CONSEQUENCES OF SUBSTANCE ABUSE:22880}  Past Medical History:  Past Medical History:  Diagnosis Date  Arthritis    CHF (congestive heart failure) (HCC)    COPD (chronic obstructive pulmonary  disease) (HCC)    Coronary artery disease    Diabetes mellitus without complication (HCC)    GERD (gastroesophageal reflux disease)    Glaucoma    Headache    Hiatal hernia    Hyperlipidemia    Hypertension    Neuropathy    Obesity    Peripheral neuropathy    Pseudophakia    Sleep apnea    Spinal stenosis    Spondylolisthesis of lumbar region    Thoracic aortic atherosclerosis [facility])     Past Surgical History:  Procedure Laterality Date   ABDOMINAL HYSTERECTOMY     BACK SURGERY  2019   [facility] [place]   BREAST EXCISIONAL BIOPSY Left yrs ago    benign   CATARACT EXTRACTION W/ INTRAOCULAR LENS  IMPLANT, BILATERAL     COLONOSCOPY N/A 06/01/2020   Procedure: COLONOSCOPY;  Surgeon: Shane Darling, MD;  Location: [facility] ENDOSCOPY;  Service: Endoscopy;  Laterality: N/A;   COLONOSCOPY WITH PROPOFOL  N/A 05/30/2015   Procedure: COLONOSCOPY WITH PROPOFOL ;  Surgeon: Marnee Sink, MD;  Location: [facility] ENDOSCOPY;  Service: Endoscopy;  Laterality: N/A;   ESOPHAGOGASTRODUODENOSCOPY N/A 06/01/2020   Procedure: ESOPHAGOGASTRODUODENOSCOPY (EGD);  Surgeon: Shane Darling, MD;  Location: [facility] ENDOSCOPY;  Service: Endoscopy;  Laterality: N/A;   ESOPHAGOGASTRODUODENOSCOPY (EGD) WITH PROPOFOL  N/A 05/30/2015   Procedure: ESOPHAGOGASTRODUODENOSCOPY (EGD) WITH PROPOFOL ;  Surgeon: Marnee Sink, MD;  Location: [facility] ENDOSCOPY;  Service: Endoscopy;  Laterality: N/A;   EYE SURGERY     Bialteral for glaucoma   Eye surgery for glaucoma     JOINT REPLACEMENT     Left   LEFT HEART CATH AND CORONARY ANGIOGRAPHY Left 01/30/2020   Procedure: LEFT HEART CATH AND CORONARY ANGIOGRAPHY;  Surgeon: Antonette Batters, MD;  Location: [facility] INVASIVE CV LAB;  Service: Cardiovascular;  Laterality: Left;   Left total knee replacement     OOPHORECTOMY     PELVIC LAPAROSCOPY     POSTERIOR LUMBAR FUSION  04/27/2017   TUBAL LIGATION      Family Psychiatric History: ***  Family History:  Family History  Problem Relation  Age of Onset   Diabetes Mother     Social History:   Social History   Socioeconomic History   Marital status: Widowed    Spouse name: Not on file   Number of children: Not on file   Years of education: Not on file   Highest education level: Not on file  Occupational History   Not on file  Tobacco Use   Smoking status: Former   Smokeless tobacco: Never  Vaping Use   Vaping status: Never Used  Substance and Sexual Activity   Alcohol use: Yes    Alcohol/week: 1.0 standard drink of alcohol    Types: 1 Glasses of wine per week   Drug use: No   Sexual activity: Not Currently  Other Topics Concern   Not on file  Social History Narrative   Not on file   Social Drivers of Health   Financial Resource Strain: Not on file  Food Insecurity: Not on file  Transportation Needs: No Transportation Needs (03/10/2023)   PRAPARE - Administrator, Civil Service (Medical): No    Lack of Transportation (Non-Medical): No  Physical Activity: Not on file  Stress: Not on file  Social Connections: Not on file    Additional Social History: ***  Allergies:   Allergies  Allergen Reactions   Oxycodone -Acetaminophen  Rash    UNSPECIFIED REACTION     Tramadol Rash and Other (See Comments)    INTOLERANCE > Chest pain Other reaction(s): Other (See Comments) Chest pain    Metabolic Disorder Labs: No results found for: "HGBA1C", "MPG" No results found for: "PROLACTIN" Lab Results  Component Value Date   TRIG 92 08/24/2019   No results found for: "TSH"  Therapeutic Level Labs: No results found for: "LITHIUM" No results found for: "CBMZ" No results found for: "VALPROATE"  Current Medications: No current facility-administered medications for this visit.   Current Outpatient Medications  Medication Sig Dispense Refill   aspirin  EC 81 MG tablet Take 81 mg by mouth daily. Swallow whole.     dorzolamide -timolol  (COSOPT ) 2-0.5 % ophthalmic solution Place 1 drop into the left  eye 2 (two) times daily.     erythromycin  ophthalmic ointment Place 1 Application into the right eye 4 (four) times daily. (Patient not taking: Reported on 12/11/2023) 3.5 g 0   isosorbide  mononitrate (IMDUR ) 30 MG 24 hr tablet Take 30 mg by mouth daily.     lamoTRIgine  (LAMICTAL ) 25 MG tablet Take 25 mg by mouth 2 (two) times daily.     latanoprost (XALATAN) 0.005 % ophthalmic solution Place 1 drop into the left eye at bedtime.     metoprolol  succinate (TOPROL -XL) 25 MG 24 hr tablet Take 25 mg by mouth daily.     mirtazapine  (REMERON ) 15 MG tablet Take 1 tablet (15 mg total) by mouth at bedtime. 30 tablet 0   potassium chloride  (KLOR-CON  M) 10 MEQ tablet Take 1 tablet by mouth 2 (two) times daily.     pravastatin  (PRAVACHOL ) 10 MG tablet Take 1 tablet by mouth every evening.     QUEtiapine  (SEROQUEL ) 50 MG tablet Take 1 tablet (50 mg total) by mouth in the morning. 30 tablet 0   torsemide  (DEMADEX ) 20 MG tablet Take 20 mg by mouth daily.     Facility-Administered Medications Ordered in Other Visits  Medication Dose Route Frequency Provider Last Rate Last Admin   aspirin  EC tablet 81 mg  81 mg Oral Daily Lubertha Rush, MD   81 mg at 12/15/23 1112   dorzolamide -timolol  (COSOPT ) 2-0.5 % ophthalmic solution 1 drop  1 drop Left Eye BID Lubertha Rush, MD   1 drop at 12/15/23 1112   isosorbide  mononitrate (IMDUR ) 24 hr tablet 30 mg  30 mg Oral Daily Lubertha Rush, MD   30 mg at 12/15/23 1112   lamoTRIgine  (LAMICTAL ) tablet 25 mg  25 mg Oral BID Lubertha Rush, MD   25 mg at 12/15/23 1112   latanoprost (XALATAN) 0.005 % ophthalmic solution 1 drop  1 drop Left Eye QHS Lubertha Rush, MD   1 drop at 12/14/23 2127   metoprolol  succinate (TOPROL -XL) 24 hr tablet 25 mg  25 mg Oral Daily Lubertha Rush, MD   25 mg at 12/15/23 1112   mirtazapine  (REMERON ) tablet 15 mg  15 mg Oral QHS Lubertha Rush, MD   15 mg at 12/14/23 2126   potassium chloride  SA (KLOR-CON  M) CR tablet 10 mEq  10 mEq Oral BID Lubertha Rush, MD    10 mEq at 12/15/23 1112   pravastatin  (PRAVACHOL ) tablet 10 mg  10 mg Oral QPM Lubertha Rush, MD   10 mg at 12/13/23 1912   QUEtiapine  (SEROQUEL ) tablet 50 mg  50 mg Oral q AM Lubertha Rush, MD  50 mg at 12/14/23 1156   torsemide  (DEMADEX ) tablet 20 mg  20 mg Oral Daily Lubertha Rush, MD   20 mg at 12/15/23 1111    Musculoskeletal: Strength & Muscle Tone: normal Gait & Station: normal Patient leans: N/A  Psychiatric Specialty Exam: Review of Systems  There were no vitals taken for this visit.There is no height or weight on file to calculate BMI.  General Appearance: {Appearance:22683}  Eye Contact:  {BHH EYE CONTACT:22684}  Speech:  Clear and Coherent  Volume:  Normal  Mood:  {BHH MOOD:22306}  Affect:  {Affect (PAA):22687}  Thought Process:  Coherent  Orientation:  {BHH ORIENTATION (PAA):22689}  Thought Content:  {Thought Content:22690}  Suicidal Thoughts:  {ST/HT (PAA):22692}  Homicidal Thoughts:  {ST/HT (PAA):22692}  Memory:  {BHH MEMORY:22881}  Judgement:  {Judgement (PAA):22694}  Insight:  {Insight (PAA):22695}  Psychomotor Activity:  {Psychomotor (PAA):22696}  Concentration:  {Concentration:21399}  Recall:  {BHH GOOD/FAIR/POOR:22877}  Fund of Knowledge:{BHH GOOD/FAIR/POOR:22877}  Language: {BHH GOOD/FAIR/POOR:22877}  Akathisia:  {BHH YES OR NO:22294}  Handed:  {Handed:22697}  AIMS (if indicated):  {Desc; done/not:10129}  Assets:  {Assets (PAA):22698}  ADL's:  {BHH ZOX'W:96045}  Cognition: {chl bhh cognition:304700322}  Sleep:  {BHH GOOD/FAIR/POOR:22877}   Screenings: Flowsheet Row ED from 12/11/2023 in [facility] Emergency Department at [facility] ED from 12[facility]10/2023 in [facility] Emergency Department at [facility] ED from 04/24/2023 in [facility] Emergency Department at [facility]  C-SSRS RISK CATEGORY No Risk No Risk No Risk       Assessment and Plan:   Plan   The patient demonstrates the following risk factors for suicide: Chronic  risk factors for suicide include: {Chronic Risk Factors for WUJWJXB:14782956}. Acute risk factors for suicide include: {Acute Risk Factors for OZHYQMV:78469629}. Protective factors for this patient include: {Protective Factors for Suicide BMWU:13244010}. Considering these factors, the overall suicide risk at this point appears to be {Desc; low/moderate/high:110033}. Patient {ACTION; IS/IS UVO:53664403} appropriate for outpatient follow up.   Collaboration of Care: {BH OP Collaboration of Care:21014065}  Patient/Guardian was advised Release of Information must be obtained prior to any record release in order to collaborate their care with an outside provider. Patient/Guardian was advised if they have not already done so to contact the registration department to sign all necessary forms in order for us  to release information regarding their care.   Consent: Patient/Guardian gives verbal consent for treatment and assignment of benefits for services provided during this visit. Patient/Guardian expressed understanding and agreed to proceed.   Todd Fossa, MD 5/23/20253:47 PM

## 2023-12-15 NOTE — TOC Progression Note (Addendum)
 Transition of Care [facility]) - Progression Note    Patient Details  Name: Penny Hart MRN: [id number] Date of Birth: [birth year]  [facility] CM/SW Contact  Zoe Hinds, RN Phone Number: 12/15/2023, 12:32 PM  Clinical Narrative:    This CM attempted to reach pt's DSS worker today Gareth Junes @ [phone] and was sent to VM . This CM was attempting updates regarding pt's Daughter's decision to become pt's HCPOA/POA. TOC will cont to follow dc planning / care coordination and update as applicable.    Expected Discharge Plan: Skilled Nursing Facility Barriers to Discharge: Continued Medical Work up  Expected Discharge Plan and Services   Discharge Planning Services: CM Consult   Living arrangements for the past 2 months: Apartment                                       Social Determinants of Health (SDOH) Interventions SDOH Screenings   Housing: Unknown (08/01/2023)   Received from [facility]  Transportation Needs: No Transportation Needs (03/10/2023)  Tobacco Use: Medium Risk (12/11/2023)    Readmission Risk Interventions     No data to display

## 2023-12-15 NOTE — ED Notes (Signed)
 Patient continues to intermittently yell out for "[name]."  Unable to redirect at times, but currently remaining in bed.  Will continue to monitor

## 2023-12-15 NOTE — ED Notes (Signed)
Patient resting with eyes closed. Respirations even and unlabored.

## 2023-12-15 NOTE — ED Provider Notes (Signed)
-----------------------------------------   5:40 AM on 12/15/2023 -----------------------------------------   Blood pressure (!) 91/57, pulse 63, temperature (!) 97.4 F (36.3 C), temperature source Oral, resp. rate 16, height 5\' 5"  (1.651 m), weight 77 kg, SpO2 97%.  The patient is calm and cooperative at this time.  There have been no acute events since the last update.  Awaiting disposition plan from Social Work team.   Yeily Link J, MD 12/15/23 [phone]

## 2023-12-15 NOTE — TOC Progression Note (Signed)
 Transition of Care [facility]) - Progression Note    Patient Details  Name: Penny Hart MRN: [id number] Date of Birth: 1946/04/27  Transition of Care [facility]) CM/SW Contact  Zoe Hinds, RN Phone Number: 12/15/2023, 3:13 PM  Clinical Narrative:    This CM reached pt's DSS worker Gareth Junes @[phone] and was updated she spoke with pt's daughter Ammon Bales and is waiting for a call back to confirm if she will continue to speak on pt's behalf and move forward with guardianship. This CM faxed over PT, mobility , and progress notes per request of Ms. McNeil. TOC will cont to follow dc planning / care coordination and update as applicable.    Expected Discharge Plan: Skilled Nursing Facility Barriers to Discharge: Continued Medical Work up  Expected Discharge Plan and Services   Discharge Planning Services: CM Consult   Living arrangements for the past 2 months: Apartment                                       Social Determinants of Health (SDOH) Interventions SDOH Screenings   Housing: Unknown (08/01/2023)   Received from [facility]  Transportation Needs: No Transportation Needs (03/10/2023)  Tobacco Use: Medium Risk (12/11/2023)    Readmission Risk Interventions     No data to display

## 2023-12-15 NOTE — Progress Notes (Signed)
 Physical Therapy Treatment Patient Details Name: Penny Hart MRN: [id number] DOB: September 25, 1945 Today's Date: 12/15/2023   History of Present Illness Pt is a 78 y.o. female presents to the ED via EMS with son called stating that pt was agitated. EMS reports that police were at her home and had them transport her to the ED. Per MD note pt admitted to the ED with plan for psychiatric and TTS evaluation. 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    PT Comments  Pt was pleasantly confused but motivated to participate during the session and put forth good effort throughout.  Pt required no assistance with transfers and only minimal assist going from sit to sup for BLE management likely secondary to gown being wrapped around pt making hip abd challenging.  Pt required no physical assist for stability during gait but did require min A to help guide the RW with pt stating that she had lost her glasses and couldn't see well enough to avoid obstacles.   Pt was able to amb 80 feet x 2 with short seated therapeutic rest break between bouts with no adverse symptoms and with SpO2 and HR WNL on room air.  Pt will benefit from continued PT services upon discharge to safely address deficits listed in patient problem list for decreased caregiver assistance and eventual return to PLOF.      If plan is discharge home, recommend the following: Supervision due to cognitive status;Assist for transportation;A little help with walking and/or transfers;A little help with bathing/dressing/bathroom;Assistance with cooking/housework   Can travel by private vehicle        Equipment Recommendations  Other (comment) (Would benefit from a RW, need to verify if she already has one)    Recommendations for Other Services       Precautions / Restrictions Precautions Precautions:  Fall Restrictions Weight Bearing Restrictions Per Provider Order: No     Mobility  Bed Mobility           Sit to supine: Min assist   General bed mobility comments: Min A for BLE management but likely limited by wrapped hospital gown    Transfers Overall transfer level: Needs assistance Equipment used: Rolling walker (2 wheels) Transfers: Sit to/from Stand Sit to Stand: Contact guard assist           General transfer comment: Pt able to stand and sit with good control without the use of her UEs to assist    Ambulation/Gait Ambulation/Gait assistance: Min assist, Contact guard assist Gait Distance (Feet): 80 Feet Assistive device: Rolling walker (2 wheels) Gait Pattern/deviations: Step-through pattern, Decreased step length - right, Decreased step length - left Gait velocity: decreased     General Gait Details: Min A to guide the RW only with pt stating she was having issues seeing clearly secondary to having lost her glasses; pt generally steady with amb with no overt LOB including during 180 deg turns and start/stops   Stairs             Wheelchair Mobility     Tilt Bed    Modified Rankin (Stroke Patients Only)       Balance Overall balance assessment: Needs assistance Sitting-balance support: Feet supported Sitting balance-Leahy Scale: Good     Standing balance support: Bilateral upper extremity supported, During functional activity Standing balance-Leahy Scale: Good  Communication Communication Communication: No apparent difficulties  Cognition Arousal: Alert Behavior During Therapy: WFL for tasks assessed/performed   PT - Cognitive impairments: Orientation, Awareness, Problem solving, Safety/Judgement, No family/caregiver present to determine baseline                         Following commands: Impaired Following commands impaired: Follows one step commands with increased time, Follows  one step commands inconsistently    Cueing Cueing Techniques: Verbal cues, Gestural cues, Tactile cues  Exercises      General Comments        Pertinent Vitals/Pain Pain Assessment Pain Assessment: Faces Faces Pain Scale: Hurts a little bit Pain Location: R knee Pain Intervention(s): Repositioned, Monitored during session    Home Living                          Prior Function            PT Goals (current goals can now be found in the care plan section) Progress towards PT goals: Progressing toward goals    Frequency    Min 1X/week      PT Plan      Co-evaluation              AM-PAC PT "6 Clicks" Mobility   Outcome Measure  Help needed turning from your back to your side while in a flat bed without using bedrails?: None Help needed moving from lying on your back to sitting on the side of a flat bed without using bedrails?: A Little Help needed moving to and from a bed to a chair (including a wheelchair)?: A Little Help needed standing up from a chair using your arms (e.g., wheelchair or bedside chair)?: A Little Help needed to walk in hospital room?: A Little Help needed climbing 3-5 steps with a railing? : A Little 6 Click Score: 19    End of Session Equipment Utilized During Treatment: Gait belt Activity Tolerance: Patient tolerated treatment well Patient left: with bed alarm set;with call bell/phone within reach;in bed;Other (comment) (Pt in bed at nursing station in ED hallway) Nurse Communication: Mobility status PT Visit Diagnosis: Muscle weakness (generalized) (M62.81);Difficulty in walking, not elsewhere classified (R26.2);Unsteadiness on feet (R26.81)     Time: 1610-9604 PT Time Calculation (min) (ACUTE ONLY): 17 min  Charges:    $Gait Training: 8-22 mins PT General Charges $$ ACUTE PT VISIT: 1 Visit                     D. Scott Rmoni Keplinger PT, DPT 12/15/23, 2:43 PM

## 2023-12-15 NOTE — ED Notes (Signed)
Patient resting with eyes closed at this time.  Respirations even and unlabored ?

## 2023-12-15 NOTE — Progress Notes (Signed)
 Occupational Therapy Treatment Patient Details Name: Penny Hart MRN: [id number] DOB: 1946/06/28 Today's Date: 12/15/2023   History of present illness Pt is a 78 y.o. female presents to the ED via EMS with son called stating that pt was agitated. EMS reports that police were at her home and had them transport her to the ED. Per MD note pt admitted to the ED with plan for psychiatric and TTS evaluation. 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   OT comments  Pt is supine in bed on arrival. Easily arousable and agreeable to OT session. She continues with R knee pain, but is able to ambulate using RW with CGA for ~75 feet. Pt performed bed mobility with supervision this date. Donned shoes seated at EOB with SBA, increased time. All aspects of toileting and standing hand hygiene performed with CGA/SBA for safety, no LOB. Pt needs cueing at times for RW management d/t her low vision issues. Pt returned to bed with all needs in place and will cont to require skilled acute OT services to maximize her safety and IND to return to PLOF.       If plan is discharge home, recommend the following:  A little help with walking and/or transfers;A little help with bathing/dressing/bathroom;Assistance with cooking/housework;Assist for transportation;Help with stairs or ramp for entrance;Direct supervision/assist for financial management;Supervision due to cognitive status;Direct supervision/assist for medications management   Equipment Recommendations  Other (comment) (RW)    Recommendations for Other Services      Precautions / Restrictions Precautions Precautions: Fall Restrictions Weight Bearing Restrictions Per Provider Order: No       Mobility Bed Mobility Overal bed mobility: Needs Assistance       Supine to sit: Supervision, Used rails, HOB elevated Sit to  supine: Supervision   General bed mobility comments: no physical assist needed this date, pt utilized bed rails from semi supine position; pt able to scoot herself up to [facility] in long sitting    Transfers Overall transfer level: Needs assistance Equipment used: Rolling walker (2 wheels) Transfers: Sit to/from Stand Sit to Stand: Contact guard assist           General transfer comment: increased time/effort, able to stand with CGA at RW then ambulate ~75 feet     Balance Overall balance assessment: Needs assistance Sitting-balance support: Feet supported Sitting balance-Leahy Scale: Good     Standing balance support: During functional activity, No upper extremity supported Standing balance-Leahy Scale: Good Standing balance comment: no LOB during standing peri-care and hand hygiene at sink without UE support                           ADL either performed or assessed with clinical judgement   ADL Overall ADL's : Needs assistance/impaired     Grooming: Wash/dry hands;Standing;Supervision/safety;Contact guard assist Grooming Details (indicate cue type and reason): CGA/SBA at sink, no LOB when reaching for soap, paper towels, etc             Lower Body Dressing: Supervision/safety Lower Body Dressing Details (indicate cue type and reason): seated EOB to don slipper shoes Toilet Transfer: Contact guard assist;Supervision/safety;Cueing for safety;Regular Toilet;Rolling walker (2 wheels) Toilet Transfer Details (indicate cue type and reason): cueing for safety d/t low vision Toileting- Clothing Manipulation and Hygiene: Supervision/safety;Sitting/lateral lean;Sit to/from stand Toileting - Clothing Manipulation Details (indicate cue type and reason): able  to perform anterior hygiene seated, then stood to wipe her buttocks with use of hygiene wipe without UE support and no LOB     Functional mobility during ADLs: Contact guard assist;Rolling walker (2 wheels)       Extremity/Trunk Assessment              Vision Ability to See in Adequate Light: 2 Moderately impaired Additional Comments: blind R eye, reports low vision in L eye   Perception     Praxis     Communication Communication Communication: No apparent difficulties   Cognition Arousal: Alert Behavior During Therapy: WFL for tasks assessed/performed                                 Following commands: Impaired Following commands impaired: Follows one step commands with increased time, Follows one step commands inconsistently      Cueing   Cueing Techniques: Verbal cues, Gestural cues, Tactile cues  Exercises      Shoulder Instructions       General Comments      Pertinent Vitals/ Pain       Pain Assessment Pain Assessment: Faces Faces Pain Scale: Hurts little more Pain Location: R knee Pain Descriptors / Indicators: Aching Pain Intervention(s): Monitored during session, Repositioned  Home Living                                          Prior Functioning/Environment              Frequency  Min 2X/week        Progress Toward Goals  OT Goals(current goals can now be found in the care plan section)  Progress towards OT goals: Progressing toward goals  Acute Rehab OT Goals Patient Stated Goal: go home OT Goal Formulation: With patient Time For Goal Achievement: 12/27/23 Potential to Achieve Goals: Good  Plan      Co-evaluation                 AM-PAC OT "6 Clicks" Daily Activity     Outcome Measure   Help from another person eating meals?: None Help from another person taking care of personal grooming?: None Help from another person toileting, which includes using toliet, bedpan, or urinal?: A Little Help from another person bathing (including washing, rinsing, drying)?: A Little Help from another person to put on and taking off regular upper body clothing?: None Help from another person to put on and  taking off regular lower body clothing?: A Little 6 Click Score: 21    End of Session Equipment Utilized During Treatment: Rolling walker (2 wheels);Gait belt  OT Visit Diagnosis: Other abnormalities of gait and mobility (R26.89);Muscle weakness (generalized) (M62.81)   Activity Tolerance Patient tolerated treatment well   Patient Left in bed;with call bell/phone within reach;with nursing/sitter in room;with bed alarm set   Nurse Communication Mobility status        Time: 6440-3474 OT Time Calculation (min): 24 min  Charges: OT General Charges $OT Visit: 1 Visit OT Treatments $Self Care/Home Management : 8-22 mins $Therapeutic Activity: 8-22 mins  Abuk Selleck, OTR/L  12/15/23, 3:36 PM   Bee Hammerschmidt E Duey Liller 12/15/2023, 3:35 PM

## 2023-12-16 MED ORDER — LORAZEPAM 2 MG/ML IJ SOLN
1.0000 mg | Freq: Once | INTRAMUSCULAR | Status: AC
  Administered 2023-12-16: 1 mg via INTRAMUSCULAR
  Filled 2023-12-16: qty 1

## 2023-12-16 MED ORDER — HALOPERIDOL LACTATE 5 MG/ML IJ SOLN
5.0000 mg | Freq: Once | INTRAMUSCULAR | Status: AC
Start: 2023-12-16 — End: 2023-12-16
  Administered 2023-12-16: 5 mg via INTRAMUSCULAR
  Filled 2023-12-16: qty 1

## 2023-12-16 NOTE — ED Notes (Signed)
 Hospital meal provided, pt tolerated w/o complaints.  Waste discarded appropriately.

## 2023-12-16 NOTE — ED Notes (Signed)
 Pt is very loud and aggressive toward staff, and threatening staff with violence, family, and bullets

## 2023-12-16 NOTE — ED Notes (Signed)
 Pt daughter Penny Hart) number: [phone]

## 2023-12-16 NOTE — ED Notes (Signed)
 RN and EDT assisted pt to bath room with walker.

## 2023-12-16 NOTE — ED Notes (Signed)
vol/toc placement.. 

## 2023-12-16 NOTE — ED Provider Notes (Signed)
-----------------------------------------   8:09 AM on 12/16/2023 -----------------------------------------   Blood pressure 124/73, pulse (!) 56, temperature 98.5 F (36.9 C), temperature source Oral, resp. rate 18, height 5\' 5"  (1.651 m), weight 77 kg, SpO2 94%.  The patient is calm and cooperative at this time.  There have been no acute events since the last update.  Awaiting disposition plan from case management/social work.    Shevonne Wolf, Clover Dao, DO 12/16/23 [phone]

## 2023-12-16 NOTE — ED Notes (Signed)
 Patient is vol peendingTOC placement

## 2023-12-16 NOTE — ED Notes (Signed)
Pt fell asleep.

## 2023-12-16 NOTE — ED Notes (Signed)
 This tech assisted pt. to bathroom at this time.

## 2023-12-16 NOTE — ED Notes (Signed)
 Pt seen housekeeping taking out trash and began yelling, "stop stealing stuff from my house, the the fuck out of my house, do not come in my house, I will shoot you and kick your ass, Im going to get [name] on your ass". Attempted to re-direct patient without success, pt began to kick and swing at nursing staff. MD Karlynn Oyster made aware.

## 2023-12-16 NOTE — ED Notes (Signed)
 Pt's brief checked. Brief dry at this time.

## 2023-12-17 MED ORDER — LORAZEPAM 2 MG/ML IJ SOLN
1.0000 mg | Freq: Once | INTRAMUSCULAR | Status: AC
  Administered 2023-12-19: 1 mg via INTRAMUSCULAR
  Filled 2023-12-17 (×2): qty 1

## 2023-12-17 MED ORDER — HALOPERIDOL LACTATE 5 MG/ML IJ SOLN
5.0000 mg | Freq: Once | INTRAMUSCULAR | Status: AC
  Administered 2023-12-19: 5 mg via INTRAMUSCULAR
  Filled 2023-12-17 (×2): qty 1

## 2023-12-17 NOTE — Progress Notes (Signed)
 Mobility Specialist - Progress Note    12/17/23 1026  Mobility  Activity Ambulated with assistance in hallway  Level of Assistance Contact guard assist, steadying assist  Assistive Device Front wheel walker  Distance Ambulated (ft) 45 ft  Range of Motion/Exercises Active  Activity Response Tolerated well  Mobility Referral Yes  Mobility visit 1 Mobility  Mobility Specialist Start Time (ACUTE ONLY) [id number]  Mobility Specialist Stop Time (ACUTE ONLY) 1021  Mobility Specialist Time Calculation (min) (ACUTE ONLY) 28 min   Pt resting in bed on RA upon entry. Pt required extra time to wake up extremely drowsy. Pt STS and ambulates to hallway beside NS CGA with RW. Pt giving verbal cuing to open her eyes and straighten walker while walking down the hallway. Pt  Takes short choppy steps but remained stable throughout the session. Pt returned to bed and left with needs in reach in view of ED staff.   Jerri Morale Mobility Specialist 12/17/23, 10:41 AM

## 2023-12-17 NOTE — ED Notes (Signed)
 Patient ambulated with cane and two assist to restroom. Patient urinated in toilet. Brief checked and dry at this time. Patient helped back to hallway bed

## 2023-12-17 NOTE — ED Notes (Signed)
 Pt found eating hospital provided lotion. When this tech attempted to get the bottle of lotion from patient, she jerked her hand away. This tech asked patient why she was wanting to eat the lotion, she stated "because it is sweet and I want to". This tech informed pt that the lotion was not edible and removed the lotion from pt hand. Les, paramedic was notified about incident.

## 2023-12-17 NOTE — ED Notes (Signed)
VOL/TOC Placement Pending

## 2023-12-17 NOTE — ED Notes (Signed)
 Pt ambulated to restroom and back to bed. Vitals updated.

## 2023-12-17 NOTE — ED Notes (Signed)
 Pt was given her dinner and eating at this time.

## 2023-12-17 NOTE — ED Notes (Signed)
 Spoke to daughter this morning. Quick update given.

## 2023-12-17 NOTE — ED Provider Notes (Signed)
-----------------------------------------   6:34 AM on 12/17/2023 -----------------------------------------   Blood pressure 107/60, pulse 60, temperature 98.1 F (36.7 C), temperature source Oral, resp. rate 16, height 1.651 m (5\' 5" ), weight 77 kg, SpO2 94%.  The patient is calm and cooperative at this time.  There have been no acute events since the last update.  Awaiting disposition plan from [facility] team.   Lynnda Sas, MD 12/17/23 [phone]

## 2023-12-18 MED ORDER — MELATONIN 5 MG PO TABS
5.0000 mg | ORAL_TABLET | Freq: Every day | ORAL | Status: DC
  Administered 2023-12-18 – 2024-01-03 (×16): 5 mg via ORAL
  Filled 2023-12-18 (×17): qty 1

## 2023-12-18 NOTE — ED Notes (Signed)
Pt given lunch tray and beverage 

## 2023-12-18 NOTE — ED Notes (Signed)
 Pt continues to yell for family but is able to be redirected.

## 2023-12-18 NOTE — ED Notes (Signed)
 Pt is alert and awake at this time.  Pt keeps calling out for family members that are not present.

## 2023-12-18 NOTE — ED Notes (Signed)
 Daughter visiting at bedside. Update given.

## 2023-12-18 NOTE — ED Notes (Signed)
 Pt assisted by this RN and [place] NT to bathroom. Pt walked about 500 ft with walker. Pt required frequent redirection d/t impaired vision and confusion

## 2023-12-18 NOTE — ED Provider Notes (Signed)
-----------------------------------------   3:45 AM on 12/18/2023 -----------------------------------------   Blood pressure 127/72, pulse 88, temperature 97.8 F (36.6 C), temperature source Oral, resp. rate 18, height 1.651 m (5\' 5" ), weight 77 kg, SpO2 96%.  The patient is calm and cooperative at this time.  There have been no acute events since the last update.  Awaiting disposition plan from [facility] team.   Lynnda Sas, MD 12/18/23 [phone]

## 2023-12-18 NOTE — ED Notes (Signed)
 Pt assisted to bathroom

## 2023-12-18 NOTE — ED Notes (Signed)
 Vol toc placement

## 2023-12-19 LAB — POTASSIUM: Potassium: 3.7 mmol/L (ref 3.5–5.1)

## 2023-12-19 MED ORDER — LORAZEPAM 1 MG PO TABS
1.0000 mg | ORAL_TABLET | Freq: Once | ORAL | Status: AC
  Administered 2023-12-19: 1 mg via ORAL
  Filled 2023-12-19: qty 1

## 2023-12-19 NOTE — NC FL2 (Addendum)
 [place]  MEDICAID FL2 LEVEL OF CARE FORM     IDENTIFICATION  Patient Name: Penny Hart Birthdate: [birth year] Sex: female Admission Date (Current Location): 12/11/2023  [place] and [place] Number:  Chiropodist and Address:  [facility], [address], [place], [place] [address]      Provider Number: [id number]  Attending Physician Name and Address:  No att. providers found  Relative Name and Phone Number:  Laury Portela [phone] (daughter)    Current Level of Care: Hospital Recommended Level of Care: SNF/Memory Care Prior Approval Number:    Date Approved/Denied:   PASRR Number: [id number] A. Last name in [place] is [place].  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   Surgery, elective    Chronic diastolic congestive heart failure (HCC)    Chronic obstructive pulmonary disease (HCC)    Diabetes mellitus type 2 in nonobese (HCC)    Benign essential HTN    Post-operative pain    Hyponatremia    Leukocytosis    Spondylolisthesis of lumbar region 04/27/2017   Blood in stool    Hiatal hernia    Benign neoplasm of cecum    Benign neoplasm of transverse colon    Diverticulosis of large intestine without diverticulitis    Rectal bleeding 05/29/2015   Non-intractable cyclical vomiting with nausea     Orientation RESPIRATION BLADDER Height & Weight     Self  Normal Continent Weight: 169 lb 12.1 oz (77 kg) Height:  5' 5 (165.1 cm)  BEHAVIORAL SYMPTOMS/MOOD NEUROLOGICAL BOWEL NUTRITION STATUS  Calm, cooperative.  (None) Continent Diet (Regular)  AMBULATORY STATUS COMMUNICATION OF NEEDS Skin   Limited Assist Verbally Normal                       Personal Care Assistance Level of Assistance  Bathing, Feeding, Dressing Bathing Assistance: Limited assistance Feeding assistance: Limited  assistance Dressing Assistance: Limited assistance     Functional Limitations Info  Sight, Hearing, Speech Sight Info: Adequate Hearing Info: Adequate Speech Info: Adequate    SPECIAL CARE FACTORS FREQUENCY  PT (By licensed PT), OT (By licensed OT)     PT Frequency: 3 x week OT Frequency: 3 x week            Contractures Contractures Info: Not present    Additional Factors Info  Code Status, Allergies Code Status Info: Full code Allergies Info: Oxycodone -acetaminophen , Tramadol           Current Medications (12/19/2023):  This is the current hospital active medication list Current Facility-Administered Medications  Medication Dose Route Frequency Provider Last Rate Last Admin   aspirin  EC tablet 81 mg  81 mg Oral Daily Lubertha Rush, MD   81 mg at 12/18/23 3244   dorzolamide -timolol  (COSOPT ) 2-0.5 % ophthalmic solution 1 drop  1 drop Left Eye BID Lubertha Rush, MD   1 drop at 12/18/23 2121   haloperidol  lactate (HALDOL ) injection 5 mg  5 mg Intramuscular Once Smith, Dylan, MD       isosorbide  mononitrate (IMDUR ) 24 hr tablet 30 mg  30 mg Oral Daily Lubertha Rush, MD   30 mg at 12/18/23 0102   lamoTRIgine  (LAMICTAL ) tablet 25 mg  25 mg Oral BID Lubertha Rush, MD   25 mg at 12/18/23 2121   latanoprost  (XALATAN ) 0.005 % ophthalmic solution  1 drop  1 drop Left Eye QHS Lubertha Rush, MD   1 drop at 12/18/23 2121   LORazepam  (ATIVAN ) injection 1 mg  1 mg Intramuscular Once Smith, Dylan, MD       melatonin tablet 5 mg  5 mg Oral QHS Lubertha Rush, MD   5 mg at 12/18/23 2121   metoprolol  succinate (TOPROL -XL) 24 hr tablet 25 mg  25 mg Oral Daily Lubertha Rush, MD   25 mg at 12/18/23 1610   mirtazapine  (REMERON ) tablet 15 mg  15 mg Oral QHS Lubertha Rush, MD   15 mg at 12/18/23 2121   potassium chloride  SA (KLOR-CON  M) CR tablet 10 mEq  10 mEq Oral BID Lubertha Rush, MD   10 mEq at 12/18/23 2121   pravastatin  (PRAVACHOL ) tablet 10 mg  10 mg Oral QPM Lubertha Rush, MD   10 mg at  12/18/23 1731   QUEtiapine  (SEROQUEL ) tablet 50 mg  50 mg Oral q AM Lubertha Rush, MD   50 mg at 12/18/23 0825   torsemide  (DEMADEX ) tablet 20 mg  20 mg Oral Daily Lubertha Rush, MD   20 mg at 12/18/23 9604   Current Outpatient Medications  Medication Sig Dispense Refill   aspirin  EC 81 MG tablet Take 81 mg by mouth daily. Swallow whole.     dorzolamide -timolol  (COSOPT ) 2-0.5 % ophthalmic solution Place 1 drop into the left eye 2 (two) times daily.     isosorbide  mononitrate (IMDUR ) 30 MG 24 hr tablet Take 30 mg by mouth daily.     lamoTRIgine  (LAMICTAL ) 25 MG tablet Take 25 mg by mouth 2 (two) times daily.     latanoprost  (XALATAN ) 0.005 % ophthalmic solution Place 1 drop into the left eye at bedtime.     metoprolol  succinate (TOPROL -XL) 25 MG 24 hr tablet Take 25 mg by mouth daily.     mirtazapine  (REMERON ) 15 MG tablet Take 1 tablet (15 mg total) by mouth at bedtime. 30 tablet 0   potassium chloride  (KLOR-CON  M) 10 MEQ tablet Take 1 tablet by mouth 2 (two) times daily.     pravastatin  (PRAVACHOL ) 10 MG tablet Take 1 tablet by mouth every evening.     QUEtiapine  (SEROQUEL ) 50 MG tablet Take 1 tablet (50 mg total) by mouth in the morning. 30 tablet 0   torsemide  (DEMADEX ) 20 MG tablet Take 20 mg by mouth daily.     erythromycin  ophthalmic ointment Place 1 Application into the right eye 4 (four) times daily. (Patient not taking: Reported on 12/11/2023) 3.5 g 0     Discharge Medications: Please see discharge summary for a list of discharge medications.  Relevant Imaging Results:  Relevant Lab Results:   Additional Information SS#: [id number]  Odilia Bennett, LCSW

## 2023-12-19 NOTE — ED Notes (Signed)
 Pts bed alarm, alarming in hallway. Pt attempting to get out of bed. Pt redirected and assisted back up into bed with comfort measures applied. Overhead lights dimmed down.

## 2023-12-19 NOTE — Progress Notes (Signed)
 Occupational Therapy Treatment Patient Details Name: Penny Hart MRN: [id number] DOB: October 07, 1945 Today's Date: 12/19/2023   History of present illness Pt is a 78 y.o. female presents to the ED via EMS with son called stating that pt was agitated. EMS reports that police were at her home and had them transport her to the ED. Per MD note pt admitted to the ED with plan for psychiatric and TTS evaluation. 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   OT comments  Pt is supine in bed on arrival. Agitated, yelling and stating "get those people out of my bathroom, tell them they need to go somewhere else." She does not appear in pain this session. Very hard to redirect, confused, hallucinating, but agreeable to walk. Pt needing supervision to reach EOB and Min A for BLE management to return to supine. Pt needed constant redirection and cueing for safety during ambulation ~35 feet with assist for RW management and pt trying to open doors. Stood at sink to perform Special educational needs teacher with CGA. Pt returned to bed with all needs in place and will cont to require skilled acute OT services to maximize her safety and IND to return to PLOF.       If plan is discharge home, recommend the following:  A little help with walking and/or transfers;A little help with bathing/dressing/bathroom;Assistance with cooking/housework;Assist for transportation;Help with stairs or ramp for entrance;Direct supervision/assist for financial management;Supervision due to cognitive status;Direct supervision/assist for medications management   Equipment Recommendations  Other (comment) (RW)    Recommendations for Other Services      Precautions / Restrictions Precautions Precautions: Fall Recall of Precautions/Restrictions: Impaired Restrictions Weight Bearing Restrictions Per Provider Order: No        Mobility Bed Mobility Overal bed mobility: Needs Assistance Bed Mobility: Supine to Sit, Sit to Supine     Supine to sit: Supervision, Used rails, HOB elevated Sit to supine: Min assist   General bed mobility comments: supervision to reach EOB with HOB in semi supine; Min A for BLE management to return to supine at end of session    Transfers Overall transfer level: Needs assistance Equipment used: Rolling walker (2 wheels) Transfers: Sit to/from Stand Sit to Stand: Contact guard assist           General transfer comment: increased time/effort, able to stand with CGA at RW then ambulate ~35 feet this date with multiple stops at each door trying to see who is in her "house"; constant redirection and cueing needed for safety with RW     Balance Overall balance assessment: Needs assistance Sitting-balance support: Feet supported Sitting balance-Leahy Scale: Good     Standing balance support: During functional activity, No upper extremity supported, Single extremity supported, Reliant on assistive device for balance Standing balance-Leahy Scale: Fair Standing balance comment: very distracted and hard to redirect, would take hands completely off walker and try to open doors, etc. CGA to Min A for RW management and safety                           ADL either performed or assessed with clinical judgement   ADL Overall ADL's : Needs assistance/impaired     Grooming: Wash/dry hands;Standing;Contact guard assist Grooming Details (indicate cue type and reason): CGA d/t impuslivity and confusion this date  General ADL Comments: pt very agitated and confused this date with hallucinations. hard to redirect, thinking she is at home and strangers are in her bathroom/bedroom and men hiding behind doors, etc.    Extremity/Trunk Assessment              Vision       Perception     Praxis     Communication  Communication Communication: No apparent difficulties   Cognition Arousal: Alert Behavior During Therapy: Agitated, Impulsive Cognition: Cognition impaired, History of cognitive impairments, No family/caregiver present to determine baseline   Orientation impairments: Place, Time, Situation         OT - Cognition Comments: pt hallucinating this date, very hard to redirect                 Following commands: Impaired Following commands impaired: Follows one step commands inconsistently      Cueing   Cueing Techniques: Verbal cues, Gestural cues, Tactile cues  Exercises      Shoulder Instructions       General Comments constant cueing, impuslive, confused, hallucinating, etc.    Pertinent Vitals/ Pain       Pain Assessment Pain Assessment: No/denies pain Pain Intervention(s): Monitored during session  Home Living                                          Prior Functioning/Environment              Frequency  Min 2X/week        Progress Toward Goals  OT Goals(current goals can now be found in the care plan section)  Progress towards OT goals: Progressing toward goals  Acute Rehab OT Goals Patient Stated Goal: return home OT Goal Formulation: With patient Time For Goal Achievement: 12/27/23 Potential to Achieve Goals: Good  Plan      Co-evaluation                 AM-PAC OT "6 Clicks" Daily Activity     Outcome Measure   Help from another person eating meals?: None Help from another person taking care of personal grooming?: None Help from another person toileting, which includes using toliet, bedpan, or urinal?: A Little Help from another person bathing (including washing, rinsing, drying)?: A Little Help from another person to put on and taking off regular upper body clothing?: None Help from another person to put on and taking off regular lower body clothing?: A Little 6 Click Score: 21    End of Session Equipment  Utilized During Treatment: Rolling walker (2 wheels);Gait belt  OT Visit Diagnosis: Other abnormalities of gait and mobility (R26.89);Muscle weakness (generalized) (M62.81)   Activity Tolerance Patient tolerated treatment well;Treatment limited secondary to agitation   Patient Left in bed;with call bell/phone within reach;with nursing/sitter in room;with bed alarm set   Nurse Communication Mobility status        Time: 1610-9604 OT Time Calculation (min): 27 min  Charges: OT General Charges $OT Visit: 1 Visit OT Treatments $Therapeutic Activity: 23-37 mins  Brynlie Daza, OTR/L  12/19/23, 3:57 PM  Linnie Delgrande E Illya Gienger 12/19/2023, 3:54 PM

## 2023-12-19 NOTE — ED Notes (Signed)
 Ambulated pt to bathroom and back without incident with walker. Pt back in bed resting.

## 2023-12-19 NOTE — ED Notes (Signed)
 Pt very agitated, refusing to stay in bed. Pt almost fell repeatedly while standing next to the bed. Pt redirected several times back to bed, pt now in bed and very agitated at this time.

## 2023-12-19 NOTE — TOC Progression Note (Addendum)
 Transition of Care [facility]) - Progression Note    Patient Details  Name: Penny Hart MRN: [id number] Date of Birth: 04-08-1946  [facility]) CM/SW Contact  Odilia Bennett, LCSW Phone Number: 12/19/2023, 9:11 AM  Clinical Narrative:  Left voicemail for APS social worker to see if there are any updates in regards to who patient's decision-maker will be. Per TOC note from 5/20, DSS was requesting an FL2 to assist with placement. CSW completed FL2 and sent to APS supervisor in a secure email.   3:20 pm: Per DSS, family is still the decision maker at this time.  Expected Discharge Plan: Skilled Nursing Facility Barriers to Discharge: Continued Medical Work up  Expected Discharge Plan and Services   Discharge Planning Services: CM Consult   Living arrangements for the past 2 months: Apartment                                       Social Determinants of Health (SDOH) Interventions SDOH Screenings   Housing: Unknown (08/01/2023)   Received from [facility]  Transportation Needs: No Transportation Needs (03/10/2023)  Tobacco Use: Medium Risk (12/11/2023)    Readmission Risk Interventions     No data to display

## 2023-12-20 MED ORDER — HALOPERIDOL LACTATE 5 MG/ML IJ SOLN
2.0000 mg | Freq: Once | INTRAMUSCULAR | Status: DC
  Filled 2023-12-20: qty 1

## 2023-12-20 MED ORDER — HALOPERIDOL LACTATE 5 MG/ML IJ SOLN
5.0000 mg | Freq: Once | INTRAMUSCULAR | Status: AC
  Administered 2023-12-20: 5 mg via INTRAMUSCULAR

## 2023-12-20 NOTE — TOC Progression Note (Signed)
 Transition of Care [facility]) - Progression Note    Patient Details  Name: Penny Hart MRN: [id number] Date of Birth: 1945/08/02  Transition of Care [facility]) CM/SW Contact  [place] L Telecia [place], [place] Phone Number: 12/20/2023, 3:09 PM  Clinical Narrative:     CSW sent an email to [employer] to initiate screening process for LTC Medicaid for patient.    Expected Discharge Plan: Skilled Nursing Facility Barriers to Discharge: Continued Medical Work up  Expected Discharge Plan and Services   Discharge Planning Services: CM Consult   Living arrangements for the past 2 months: Apartment                                       Social Determinants of Health (SDOH) Interventions SDOH Screenings   Housing: Unknown (08/01/2023)   Received from [facility]  Transportation Needs: No Transportation Needs (03/10/2023)  Tobacco Use: Medium Risk (12/11/2023)    Readmission Risk Interventions     No data to display

## 2023-12-20 NOTE — ED Notes (Signed)
 Patient noted to be sleeping at this time. Will administer eye ophthalmic solution when patient awakes.

## 2023-12-20 NOTE — ED Notes (Signed)
 Pt calm and cooperative at this time. Pt agreed to taking medications.

## 2023-12-20 NOTE — ED Notes (Signed)
 Walked pt to toilet to urinate and then returned to bed. Bed alarm on.

## 2023-12-20 NOTE — TOC Progression Note (Signed)
 Transition of Care [facility]) - Progression Note    Patient Details  Name: Penny Hart MRN: [id number] Date of Birth: 11-21-1945  Transition of Care [facility]) CM/SW Contact  Penny Hart, [place] Phone Number: 12/20/2023, 4:21 PM  Clinical Narrative:     CSW received email communication from Penny Hart [facility]) regarding pt.   The pt had applied for Medicaid in March and was only eligible for Family planning Medicaid which cannot be billed for these services.I reached out to DSS in [facility] and the caseworker stated she would need to meet a Medicaid deductible of 9702.00.She does not have that amount of outstanding bills at [facility] and per her dtr Penny Hart, none elsewhere so she would not be eligible for Full Medicaid at this time.  Expected Discharge Plan: Skilled Nursing Facility Barriers to Discharge: Continued Medical Work up  Expected Discharge Plan and Services   Discharge Planning Services: CM Consult   Living arrangements for the past 2 months: Apartment                                       Social Determinants of Health (SDOH) Interventions SDOH Screenings   Housing: Unknown (08/01/2023)   Received from [facility]  Transportation Needs: No Transportation Needs (03/10/2023)  Tobacco Use: Medium Risk (12/11/2023)    Readmission Risk Interventions     No data to display

## 2023-12-20 NOTE — ED Notes (Signed)
 Pt breakfast provided at bedside

## 2023-12-20 NOTE — ED Notes (Signed)
 Pt dinner Provided at bedside

## 2023-12-20 NOTE — ED Notes (Signed)
Vol  TOC  placement

## 2023-12-20 NOTE — TOC Progression Note (Signed)
 Transition of Care [facility]) - Progression Note    Patient Details  Name: Penny Hart MRN: [id number] Date of Birth: Oct 31, 1945  Transition of Care [facility]) CM/SW Contact  [place] L Dawanna [place], [place] Phone Number: 12/20/2023, 9:09 AM  Clinical Narrative:     CSW spoke with Fabian Holster at [facility] to confirm that the facility is able to meet the needs of the pt (LTC/Memory Care) Fabian Holster advised that he was able to meet the needs however the facility does not have a female bed available. Fabian Holster advised that he will CSW a call back once the morning meeting is complete to determine if there is a bed available.    Expected Discharge Plan: Skilled Nursing Facility Barriers to Discharge: Continued Medical Work up  Expected Discharge Plan and Services   Discharge Planning Services: CM Consult   Living arrangements for the past 2 months: Apartment                                       Social Determinants of Health (SDOH) Interventions SDOH Screenings   Housing: Unknown (08/01/2023)   Received from [facility]  Transportation Needs: No Transportation Needs (03/10/2023)  Tobacco Use: Medium Risk (12/11/2023)    Readmission Risk Interventions     No data to display

## 2023-12-20 NOTE — ED Notes (Signed)
 Pt woke up and was immediately agitated. PT was getting out of bed, walking to other pt's beds, cursing and getting combative when being redirected.

## 2023-12-20 NOTE — ED Notes (Signed)
 Patients bed alarm noted to be alarming at this time. RN, Lead RN, Tech, and Security at patients bedside attempting to redirect patient to bed. Patient becoming agitated and knocking on other patients door.

## 2023-12-20 NOTE — ED Provider Notes (Signed)
-----------------------------------------   4:54 AM on 12/20/2023 -----------------------------------------   Blood pressure 104/72, pulse (!) 57, temperature 97.7 F (36.5 C), temperature source Oral, resp. rate 17, height 1.651 m (5\' 5" ), weight 77 kg, SpO2 92%.  The patient is calm and cooperative at this time.  There have been no acute events since the last update.  Awaiting disposition plan from [facility] team.   Lynnda Sas, MD 12/20/23 [phone]

## 2023-12-21 ENCOUNTER — Ambulatory Visit: Payer: Self-pay | Admitting: Psychiatry

## 2023-12-21 DIAGNOSIS — R451 Restlessness and agitation: Secondary | ICD-10-CM | POA: Diagnosis not present

## 2023-12-21 MED ORDER — QUETIAPINE FUMARATE 25 MG PO TABS
25.0000 mg | ORAL_TABLET | Freq: Every morning | ORAL | Status: DC
  Administered 2023-12-22 – 2024-01-04 (×13): 25 mg via ORAL
  Filled 2023-12-21 (×15): qty 1

## 2023-12-21 MED ORDER — HALOPERIDOL LACTATE 5 MG/ML IJ SOLN
5.0000 mg | Freq: Once | INTRAMUSCULAR | Status: AC
  Administered 2023-12-21: 5 mg via INTRAMUSCULAR
  Filled 2023-12-21: qty 1

## 2023-12-21 MED ORDER — QUETIAPINE FUMARATE 25 MG PO TABS
50.0000 mg | ORAL_TABLET | Freq: Every day | ORAL | Status: DC
  Administered 2023-12-21 – 2024-01-03 (×13): 50 mg via ORAL
  Filled 2023-12-21 (×13): qty 2

## 2023-12-21 MED ORDER — QUETIAPINE FUMARATE 25 MG PO TABS
25.0000 mg | ORAL_TABLET | Freq: Every day | ORAL | Status: DC
  Administered 2023-12-21 – 2024-01-03 (×10): 25 mg via ORAL
  Filled 2023-12-21 (×10): qty 1

## 2023-12-21 NOTE — ED Provider Notes (Signed)
-----------------------------------------   6:28 AM on 12/21/2023 -----------------------------------------   Blood pressure 124/86, pulse 60, temperature 98.2 F (36.8 C), temperature source Axillary, resp. rate 16, height 5\' 5"  (1.651 m), weight 77 kg, SpO2 95%.  The patient is calm and cooperative at this time.  There have been no acute events since the last update.  Awaiting disposition plan from case management/social work.    Hanah Moultry, Clover Dao, DO 12/21/23 [phone]

## 2023-12-21 NOTE — TOC Progression Note (Signed)
 Transition of Care [facility]) - Progression Note    Patient Details  Name: Penny Hart MRN: [id number] Date of Birth: 1945/10/16  Transition of Care [facility]) CM/SW Contact  [place] L Charmeka [place], [place] Phone Number: 12/21/2023, 11:15 AM  Clinical Narrative:     T/C to family regarding placement. Daughter was advised that Medicare only pays for 20 days. Pt does not qualify for LTC Medicaid at this time. CSW asked about the LTG for pt. Daughter advised that after the 20 days, another facility would have to be located. Daughter advised that [facility] does not have female beds right now and advised of other offers that are pending. Daughter agreed that the pending offers should be contacted.    Expected Discharge Plan: Skilled Nursing Facility Barriers to Discharge: Continued Medical Work up  Expected Discharge Plan and Services   Discharge Planning Services: CM Consult   Living arrangements for the past 2 months: Apartment                                       Social Determinants of Health (SDOH) Interventions SDOH Screenings   Housing: Unknown (08/01/2023)   Received from [facility]  Transportation Needs: No Transportation Needs (03/10/2023)  Tobacco Use: Medium Risk (12/11/2023)    Readmission Risk Interventions     No data to display

## 2023-12-21 NOTE — ED Notes (Signed)
 Pt assisted to bathroom. Pt requested and given a wet wash cloth to wash peri area. Pt's lunch tray set up.

## 2023-12-21 NOTE — Progress Notes (Signed)
 Physical Therapy Treatment Patient Details Name: Penny Hart MRN: [id number] DOB: Sep 23, 1945 Today's Date: 12/21/2023   History of Present Illness Pt is a 78 y.o. female presents to the ED via EMS with son called stating that pt was agitated. EMS reports that police were at her home and had them transport her to the ED. Per MD note pt admitted to the ED with plan for psychiatric and TTS evaluation. 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    PT Comments  Pt was long sitting in bed upon arrival. She is alert, calm, and agreeable to session. Was easily able to exit bed, stand, and ambulate ~ 75 ft with RW. Pt needs assistance mostly due to vision deficits. Overall tolerated session well. Dc recs remain appropriate to maximize independence and safety with all ADLs.    If plan is discharge home, recommend the following: Supervision due to cognitive status;Assist for transportation;A little help with walking and/or transfers;A little help with bathing/dressing/bathroom;Assistance with cooking/housework     Equipment Recommendations  Other (comment) (Defer to next level of care)       Precautions / Restrictions Precautions Precautions: Fall Recall of Precautions/Restrictions: Impaired Restrictions Weight Bearing Restrictions Per Provider Order: No     Mobility  Bed Mobility Overal bed mobility: Needs Assistance Bed Mobility: Supine to Sit, Sit to Supine  Supine to sit: Supervision Sit to supine: Supervision   Transfers Overall transfer level: Needs assistance Equipment used: Rolling walker (2 wheels) Transfers: Sit to/from Stand Sit to Stand: Supervision   Ambulation/Gait Ambulation/Gait assistance: Min assist Gait Distance (Feet): 75 Feet Assistive device: Rolling walker (2 wheels) Gait Pattern/deviations: Step-through pattern Gait velocity:  decreased  General Gait Details: Pt was able to ambulate ~ 75 ft with min assist to progress RW. no LOB or fatigue endorses.   Balance Overall balance assessment: Needs assistance Sitting-balance support: Feet supported Sitting balance-Leahy Scale: Good     Standing balance support: During functional activity, No upper extremity supported, Single extremity supported, Reliant on assistive device for balance Standing balance-Leahy Scale: Fair Standing balance comment: no LOB with use of RW       Communication Communication Communication: No apparent difficulties  Cognition Arousal: Alert Behavior During Therapy: WFL for tasks assessed/performed      PT - Cognition Comments: Pt is alert, calm, and cooperative. disoriented to time, situation.Was given seroquel  ~ 1 hour prior Following commands: Impaired Following commands impaired: Follows one step commands inconsistently    Cueing Cueing Techniques: Verbal cues, Tactile cues, Gestural cues         Pertinent Vitals/Pain Pain Assessment Pain Assessment: No/denies pain     PT Goals (current goals can now be found in the care plan section) Acute Rehab PT Goals Patient Stated Goal: none stated Progress towards PT goals: Progressing toward goals    Frequency    Min 1X/week       AM-PAC PT "6 Clicks" Mobility   Outcome Measure  Help needed turning from your back to your side while in a flat bed without using bedrails?: None Help needed moving from lying on your back to sitting on the side of a flat bed without using bedrails?: A Little Help needed moving to and from a bed to a chair (including a wheelchair)?: A Little Help needed standing up from a chair using your arms (e.g., wheelchair or bedside chair)?: A Little Help needed to walk in  hospital room?: A Little Help needed climbing 3-5 steps with a railing? : A Little 6 Click Score: 19    End of Session Equipment Utilized During Treatment: Gait belt Activity  Tolerance: Patient tolerated treatment well   Nurse Communication: Mobility status PT Visit Diagnosis: Muscle weakness (generalized) (M62.81);Difficulty in walking, not elsewhere classified (R26.2);Unsteadiness on feet (R26.81)     Time: 1414-1430 PT Time Calculation (min) (ACUTE ONLY): 16 min  Charges:    $Gait Training: 8-22 mins PT General Charges $$ ACUTE PT VISIT: 1 Visit                    Chester Costa PTA 12/21/23, 3:51 PM
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
## 2023-12-21 NOTE — ED Notes (Signed)
 Pt assisted back into bed and provided with warm blankets.

## 2023-12-21 NOTE — ED Notes (Signed)
 Pt out of bed looking for TV. Pt assisted back to bed and reoriented to where TV is. Pt provided with remote. Bed alarm set.

## 2023-12-21 NOTE — Progress Notes (Signed)
 Occupational Therapy Treatment Patient Details Name: Penny Hart MRN: [id number] DOB: 1945-08-08 Today's Date: 12/21/2023   History of present illness Pt is a 78 y.o. female presents to the ED via EMS with son called stating that pt was agitated. EMS reports that police were at her home and had them transport her to the ED. Per MD note pt admitted to the ED with plan for psychiatric and TTS evaluation. 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   OT comments  Pt seen for OT treatment on this date. Upon arrival to room pt eager to "look for her bible." Pt alert and oriented to name and birthday only, min verbal cues for redirection during session. Pt requires supervision to complete bed mobility, extra time/effort to return to bed, but no physical assistance required. Pt completed LB dressing and washed her face and hands while seated on EOB with set up assist. Pt amb ~9ft within her room with RW, often [facility] for DME management, pt often leaves RW when turning. Pt retried in bed with all needs in reach, calm and comfortable.  Pt making good progress toward goals, will continue to follow POC. Discharge recommendation remains appropriate.        If plan is discharge home, recommend the following:  A little help with walking and/or transfers;A little help with bathing/dressing/bathroom;Assistance with cooking/housework;Assist for transportation;Help with stairs or ramp for entrance;Direct supervision/assist for financial management;Supervision due to cognitive status;Direct supervision/assist for medications management   Equipment Recommendations  Other (comment)    Recommendations for Other Services      Precautions / Restrictions Precautions Precautions: Fall Recall of Precautions/Restrictions: Impaired Restrictions Weight Bearing Restrictions Per Provider  Order: No       Mobility Bed Mobility Overal bed mobility: Needs Assistance Bed Mobility: Supine to Sit, Sit to Supine     Supine to sit: Supervision Sit to supine: Supervision   General bed mobility comments: Extra time to complete with no physical assistance    Transfers Overall transfer level: Needs assistance Equipment used: Rolling walker (2 wheels) Transfers: Sit to/from Stand Sit to Stand: Supervision           General transfer comment: No physical assistance to STS from EOB multiple times during session     Balance Overall balance assessment: Needs assistance Sitting-balance support: Feet supported Sitting balance-Leahy Scale: Good Sitting balance - Comments: Steady reaching with BOS   Standing balance support: During functional activity, No upper extremity supported, Single extremity supported, Reliant on assistive device for balance Standing balance-Leahy Scale: Fair                             ADL either performed or assessed with clinical judgement   ADL Overall ADL's : Needs assistance/impaired     Grooming: Sitting;Wash/dry face;Set up               Lower Body Dressing: Set up Lower Body Dressing Details (indicate cue type and reason): Donning bilateral socks while sitting on EOB Toilet Transfer: Ambulation;Rolling walker (2 wheels) Toilet Transfer Details (indicate cue type and reason): Simulated toilet t/f         Functional mobility during ADLs: Contact guard assist;Rolling walker (2 wheels) General ADL Comments: Completed seated on EOB with set up assistance    Vision   Additional Comments: Blind R eye, 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   Perception     Praxis     Communication Communication Communication: No apparent difficulties   Cognition Arousal: Alert Behavior During Therapy: WFL for tasks assessed/performed Cognition: Cognition impaired, History of cognitive impairments, No family/caregiver present to determine  baseline   Orientation impairments: Place, Time, Situation       Executive functioning impairment (select all impairments): Problem solving, Sequencing, Initiation OT - Cognition Comments: Pt perseverating on looking for her bible, thinks she is at church unable to redirect                 Following commands: Impaired Following commands impaired: Follows one step commands inconsistently      Cueing   Cueing Techniques: Verbal cues, Tactile cues, Gestural cues  Exercises Exercises: Other exercises Other Exercises Other Exercises: Edu: Safe DME during transfers/mobility    Shoulder Instructions       General Comments Amb around room ~33ft RW + CGA, eager to get up to "look for her bible." Easily redirected to return to bed.    Pertinent Vitals/ Pain       Pain Assessment Pain Assessment: PAINAD Faces Pain Scale: No hurt Breathing: normal Negative Vocalization: none Facial Expression: smiling or inexpressive Body Language: relaxed Consolability: no need to console PAINAD Score: 0  Home Living                                          Prior Functioning/Environment              Frequency  Min 2X/week        Progress Toward Goals  OT Goals(current goals can now be found in the care plan section)  Progress towards OT goals: Progressing toward goals  Acute Rehab OT Goals Patient Stated Goal: Improve strength/function OT Goal Formulation: With patient Time For Goal Achievement: 12/27/23 Potential to Achieve Goals: Good ADL Goals Pt Will Perform Lower Body Bathing: with supervision;sitting/lateral leans;sit to/from stand Pt Will Perform Lower Body Dressing: with supervision;sit to/from stand;sitting/lateral leans Pt Will Transfer to Toilet: with supervision;regular height toilet;with modified independence  Plan      Co-evaluation                 AM-PAC OT "6 Clicks" Daily Activity     Outcome Measure   Help from another  person eating meals?: None Help from another person taking care of personal grooming?: None Help from another person toileting, which includes using toliet, bedpan, or urinal?: A Little Help from another person bathing (including washing, rinsing, drying)?: A Little Help from another person to put on and taking off regular upper body clothing?: None Help from another person to put on and taking off regular lower body clothing?: A Little 6 Click Score: 21    End of Session Equipment Utilized During Treatment: Rolling walker (2 wheels);Gait belt  OT Visit Diagnosis: Other abnormalities of gait and mobility (R26.89);Muscle weakness (generalized) (M62.81)   Activity Tolerance Patient tolerated treatment well   Patient Left in bed;with call bell/phone within reach;with bed alarm set   Nurse Communication Mobility status        Time: 1610-9604 OT Time Calculation (min): 16 min  Charges: OT General Charges $OT Visit: 1 Visit OT Treatments $Self Care/Home Management : 8-22 mins  Rosaria Common M.S. OTR/L  12/21/23, 4:09 PM

## 2023-12-21 NOTE — ED Notes (Signed)
 Pt found out of bed saying, "I'm trying to go home". Pt redirected to bed and told that her daughter would be expecting to find her here. Pt calm and cooperative and assisted back to bed. Dinner tray set up for pt.

## 2023-12-22 MED ORDER — HALOPERIDOL LACTATE 5 MG/ML IJ SOLN
5.0000 mg | Freq: Once | INTRAMUSCULAR | Status: AC
  Administered 2023-12-22: 5 mg via INTRAMUSCULAR
  Filled 2023-12-22: qty 1

## 2023-12-22 NOTE — ED Notes (Signed)
 Pt refusing medications at this time.

## 2023-12-22 NOTE — ED Notes (Signed)
vol/toc placement.. 
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
## 2023-12-22 NOTE — ED Provider Notes (Signed)
-----------------------------------------   5:55 AM on 12/22/2023 -----------------------------------------   Blood pressure 137/69, pulse (!) 57, temperature 97.8 F (36.6 C), temperature source Oral, resp. rate 18, height 5\' 5"  (1.651 m), weight 77 kg, SpO2 97%.  The patient is calm and cooperative at this time.  There have been no acute events since the last update.  Awaiting disposition plan from Social Work team.   Sherlonda Flater J, MD 12/22/23 [phone]

## 2023-12-22 NOTE — ED Notes (Signed)
 Sat patient up on the side of the bed to eat lunch. Directed patient to location of food on the table.

## 2023-12-22 NOTE — ED Notes (Signed)
 Patient up in room. This RN

## 2023-12-23 LAB — POTASSIUM: Potassium: 3.5 mmol/L (ref 3.5–5.1)

## 2023-12-23 MED ORDER — HALOPERIDOL LACTATE 5 MG/ML IJ SOLN
5.0000 mg | Freq: Once | INTRAMUSCULAR | Status: AC
  Administered 2023-12-23: 5 mg via INTRAMUSCULAR

## 2023-12-23 NOTE — ED Notes (Signed)
 Pt resting comfortably in bed with visible chest rise and fall

## 2023-12-23 NOTE — ED Notes (Signed)
 Pt breakfast tray placed on table. Tray opened and positioned for pt, pt directed to food placement on tray.

## 2023-12-23 NOTE — ED Notes (Signed)
 Pt able to eat 75% of breakfast tray. Waste disposed of appropriately.

## 2023-12-23 NOTE — ED Provider Notes (Signed)
-----------------------------------------   3:58 AM on 12/23/2023 -----------------------------------------   Blood pressure 118/84, pulse (!) 57, temperature 98.3 F (36.8 C), temperature source Oral, resp. rate 16, height 5\' 5"  (1.651 m), weight 77 kg, SpO2 97%.  The patient is calm and cooperative at this time.  There have been no acute events since the last update.  Awaiting disposition plan from case management/social work.    Aamori Mcmasters, Clover Dao, DO 12/23/23 [phone]

## 2023-12-23 NOTE — ED Notes (Signed)
 Assisted pt to wash hands.  Dinner tray at bedside.  Pt eating

## 2023-12-23 NOTE — ED Notes (Signed)
 Pt increasingly hard to re direct, banging on walls, upset and wanting to leave.  Unsure of her surroundings.  Refusing medications.

## 2023-12-24 NOTE — ED Notes (Signed)
 Assisted Pt to the bathroom and back to her bed and provided her call bell by bedside. Pt back in bed resting at this time.

## 2023-12-24 NOTE — ED Notes (Addendum)
 This NT Penny Hart and NT Penny Hart went to check on pt to ensure fall bundle is in place. Pt fall risk socks was on the floor beside her bed. Pt has on fall risk bracelet. Put on a new pair of fall risk socks and set bed alarm to alert as pt is in hospital bed and no posey alarm is needed. FALL BUNDLE IS CURRENTLY IN PLACE

## 2023-12-24 NOTE — ED Notes (Signed)
Patient is vol pending TOC placement 

## 2023-12-24 NOTE — ED Provider Notes (Signed)
-----------------------------------------   5:43 AM on 12/24/2023 -----------------------------------------   Blood pressure 105/63, pulse 71, temperature 98.4 F (36.9 C), temperature source Oral, resp. rate 16, height 5\' 5"  (1.651 m), weight 77 kg, SpO2 96%.  The patient is calm and cooperative at this time.  There have been no acute events since the last update.  Awaiting disposition plan from Social Work team.   Chevy Virgo J, MD 12/24/23 [phone]

## 2023-12-24 NOTE — Progress Notes (Signed)
 Physical Therapy Treatment Patient Details Name: Penny Hart MRN: [id number] DOB: Sep 30, 1945 Today's Date: 12/24/2023   History of Present Illness Pt is a 78 y.o. female presents to the ED via EMS with son called stating that pt was agitated. EMS reports that police were at her home and had them transport her to the ED. Per MD note pt admitted to the ED with plan for psychiatric and TTS evaluation. 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    PT Comments  Pt agreed with encouragement.  She is able to get out of bed and walk 350' on unit with RW and min a x 1 mostly for navigation and obstacle avoidance as she drifts right and attributes it to glaucoma and decreased vision.  No LOB or buckling with gait.  Generally slow but steady gait.  Returned to bed with safety precautions in place.   If plan is discharge home, recommend the following: Supervision due to cognitive status;Assist for transportation;A little help with walking and/or transfers;A little help with bathing/dressing/bathroom;Assistance with cooking/housework   Can travel by private vehicle        Equipment Recommendations       Recommendations for Other Services       Precautions / Restrictions Precautions Precautions: Fall Recall of Precautions/Restrictions: Impaired Restrictions Weight Bearing Restrictions Per Provider Order: No     Mobility  Bed Mobility Overal bed mobility: Needs Assistance Bed Mobility: Supine to Sit, Sit to Supine     Supine to sit: Contact guard Sit to supine: Contact guard assist   General bed mobility comments: inc time and cues Patient Response: Cooperative  Transfers Overall transfer level: Needs assistance Equipment used: Rolling walker (2 wheels) Transfers: Sit to/from Stand Sit to Stand: Supervision                 Ambulation/Gait Ambulation/Gait assistance: Contact guard assist, Min assist Gait Distance (Feet): 350 Feet Assistive device: Rolling walker (2 wheels) Gait Pattern/deviations: Step-through pattern, Trunk flexed, Drifts right/left Gait velocity: decreased     General Gait Details: cues and min a x 1 at times for navigation.  stated she has difficulty seeing due to glaucoma   Stairs             Wheelchair Mobility     Tilt Bed Tilt Bed Patient Response: Cooperative  Modified Rankin (Stroke Patients Only)       Balance Overall balance assessment: Needs assistance Sitting-balance support: Feet supported Sitting balance-Leahy Scale: Good     Standing balance support: During functional activity, No upper extremity supported, Single extremity supported, Reliant on assistive device for balance Standing balance-Leahy Scale: Fair Standing balance comment: no LOB with use of RW                            Communication Communication Communication: No apparent difficulties  Cognition Arousal: Alert Behavior During Therapy: WFL for tasks assessed/performed   PT - Cognitive impairments: Orientation, Awareness, Problem solving, Safety/Judgement, No family/caregiver present to determine baseline                         Following commands: Impaired Following commands impaired: Follows one step commands inconsistently    Cueing Cueing Techniques: Verbal cues, Tactile cues, Gestural cues  Exercises      General Comments  Pertinent Vitals/Pain Pain Assessment Pain Assessment: No/denies pain Pain Intervention(s): Monitored during session    Home Living                          Prior Function            PT Goals (current goals can now be found in the care plan section) Progress towards PT goals: Progressing toward goals    Frequency    Min 1X/week      PT Plan      Co-evaluation              AM-PAC PT "6  Clicks" Mobility   Outcome Measure  Help needed turning from your back to your side while in a flat bed without using bedrails?: None Help needed moving from lying on your back to sitting on the side of a flat bed without using bedrails?: A Little Help needed moving to and from a bed to a chair (including a wheelchair)?: A Little Help needed standing up from a chair using your arms (e.g., wheelchair or bedside chair)?: A Little Help needed to walk in hospital room?: A Little Help needed climbing 3-5 steps with a railing? : A Little 6 Click Score: 19    End of Session Equipment Utilized During Treatment: Gait belt Activity Tolerance: Patient tolerated treatment well Patient left: in bed;with call bell/phone within reach;with bed alarm set Nurse Communication: Mobility status PT Visit Diagnosis: Muscle weakness (generalized) (M62.81);Difficulty in walking, not elsewhere classified (R26.2);Unsteadiness on feet (R26.81)     Time: 1610-9604 PT Time Calculation (min) (ACUTE ONLY): 19 min  Charges:    $Gait Training: 8-22 mins PT General Charges $$ ACUTE PT VISIT: 1 Visit                   Charlanne Cong, PTA 12/24/23, 3:39 PM

## 2023-12-24 NOTE — TOC Progression Note (Signed)
 Transition of Care [facility]) - Progression Note    Patient Details  Name: STACY SAILER MRN: [id number] Date of Birth: July 04, 1946  Transition of Care [facility]) CM/SW Contact  Crayton Docker, RN 12/24/2023, 9:35 AM  Clinical Narrative:     CM follow up call placed to Admissions, [facility], phone: [phone] regarding memory care unit placement. CM spoke to [employer]. CM left message for return call from [facility]. CM follow up call placed to Admissions, [facility] regarding memory care unit placement. CM spoke to [place]. CM left message for Trevor Fudge to return CM call.   Expected Discharge Plan: Skilled Nursing Facility Barriers to Discharge: Continued Medical Work up  Expected Discharge Plan and Services   Discharge Planning Services: CM Consult   Living arrangements for the past 2 months: Apartment                  Social Determinants of Health (SDOH) Interventions SDOH Screenings   Housing: Unknown (08/01/2023)   Received from [facility]  Transportation Needs: No Transportation Needs (03/10/2023)  Tobacco Use: Medium Risk (12/11/2023)    Readmission Risk Interventions     No data to display

## 2023-12-24 NOTE — ED Notes (Signed)
 Fall precautions in place for Pt. This RN placed fall band, fall grip socks, bed alarm and fall sign.

## 2023-12-24 NOTE — ED Notes (Signed)
 Patient found in room undressed walking toward door. Patient dressed with two patient gowns. Patient toileted.

## 2023-12-24 NOTE — ED Notes (Signed)
 This RN found Pt getting out of bed even with fall precautions. Pt state she was trying to urinate in toilet and take off underwear. This RN assisted Pt and placed back in bed with fall precautions in place. CB in reach door open.   Fall precautions in place for Pt. This RN placed fall band, fall grip socks, bed alarm and fall sign.

## 2023-12-24 NOTE — ED Notes (Signed)
 Pt's daughter is here visiting/having dinner with patient.

## 2023-12-24 NOTE — ED Notes (Signed)
 Pt ambulated to toilet, cleaned up, pericare applied, teeth brush, gown replaced and sheets replaced. This RN ambulated Pt back to bed, no incident, fall precautions re-initated and CB in reach.  Fall precautions in place for Pt. This RN placed fall band, fall grip socks, bed alarm and fall sign.

## 2023-12-25 MED ORDER — ACETAMINOPHEN 500 MG PO TABS
1000.0000 mg | ORAL_TABLET | Freq: Once | ORAL | Status: DC
  Filled 2023-12-25: qty 2

## 2023-12-25 NOTE — Progress Notes (Signed)
 Occupational Therapy Treatment Patient Details Name: Penny Hart MRN: [id number] DOB: 11/25/1945 Today's Date: 12/25/2023   History of present illness Pt is a 78 y.o. female presents to the ED via EMS with son called stating that pt was agitated. EMS reports that police were at her home and had them transport her to the ED. Per MD note pt admitted to the ED with plan for psychiatric and TTS evaluation. 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   OT comments  Pt received in recliner, alert to self, DOB and hospital. Pleasant and agreeable to session. Pt found adjusting gown tightly around neck for modesty, appearing to have doffed second gown to around waist. OT assists pt with doffing gowns, & donning paper scrubs. Pt highly limited by visual deficits and cognition. Requires MIN A to orient and don shirt/pants, t/f standard commode in room using RW, MOD A for manual facilitation of AD and verbal cues for environmental navigation while completing functional mobility in hallway 100 ft with RW, cues for AD proximity and upright posture. Pt calm throughout, left in recliner with needs in reach and alarm activated. OT will continue to follow, pt making progress towards goals. Pt will require 24/7 supervision and assist at hospital discharge due to deficits in vision and cognition, pt appears most appropriate for memory care services at ALF if family cannot provide level of care.       If plan is discharge home, recommend the following:  A little help with walking and/or transfers;A little help with bathing/dressing/bathroom;Assistance with cooking/housework;Assist for transportation;Help with stairs or ramp for entrance;Direct supervision/assist for financial management;Supervision due to cognitive status;Direct supervision/assist for medications management   Equipment  Recommendations  Other (comment)    Recommendations for Other Services      Precautions / Restrictions Precautions Precautions: Fall Recall of Precautions/Restrictions: Impaired Precaution/Restrictions Comments: vision impaired Restrictions Weight Bearing Restrictions Per Provider Order: No       Mobility Bed Mobility               General bed mobility comments: NT, recieved and left in recliner    Transfers Overall transfer level: Needs assistance Equipment used: Rolling walker (2 wheels) Transfers: Sit to/from Stand Sit to Stand: Supervision           General transfer comment: No physical assistance to STS from EOB multiple times during session     Balance Overall balance assessment: Needs assistance Sitting-balance support: Feet supported Sitting balance-Leahy Scale: Good     Standing balance support: During functional activity, No upper extremity supported, Single extremity supported, Reliant on assistive device for balance Standing balance-Leahy Scale: Fair Standing balance comment: no LOB with use of RW                           ADL either performed or assessed with clinical judgement   ADL Overall ADL's : Needs assistance/impaired     Grooming: Wash/dry hands;Standing;Cueing for sequencing;Cueing for safety;Moderate assistance;Applying deodorant Grooming Details (indicate cue type and reason): standing at sink. unable to locate task items due to vision impairments - OT assists with soap, turning on faucet and paper towel machine). Once pt hears machine, is able to reach for second paper towel.         Upper Body Dressing : Minimal assistance;Sitting;Cueing for sequencing Upper Body Dressing Details (indicate cue type and reason):  MIN A to orient item due to vision Lower Body Dressing: Minimal assistance;Cueing for sequencing;Cueing for safety;Sit to/from stand Lower Body Dressing Details (indicate cue type and reason): OT orients pants  properly, pt threads L leg through R pant leg and unable to recognize error, OT cues to doff pants completely and re-don through appropriate leg Toilet Transfer: Ambulation;Rolling walker (2 wheels);Cueing for sequencing;Regular Toilet;Cueing for safety;Moderate assistance Toilet Transfer Details (indicate cue type and reason): t/f standard commode in room, pt unable to visually locate and requires max cues to perform step pivot transfer. pt often abandoning RW and struggling for hand placement Toileting- Clothing Manipulation and Hygiene: Supervision/safety;Sitting/lateral lean;Sit to/from stand Toileting - Clothing Manipulation Details (indicate cue type and reason): seated anterior hygiene, requests for bath wipe for increased cleanliness     Functional mobility during ADLs: Rolling walker (2 wheels);Cueing for safety;Cueing for sequencing;Moderate assistance General ADL Comments: Functional mobility 100 ft in hallway, requires CGA for safety and MOD A for manual RW facilitation + max verbal + auditory cues due to vision impairments.     Vision Ability to See in Adequate Light: 3 Highly impaired Additional Comments: Blind R eye, low vision L eye, unable to functionally use vision (requires max tactile and auditory cues to locate RW, manual facilitation of AD, unable to locate ADL task items)   Perception     Praxis     Communication Communication Communication: No apparent difficulties   Cognition Arousal: Alert Behavior During Therapy: WFL for tasks assessed/performed Cognition: Cognition impaired, History of cognitive impairments, No family/caregiver present to determine baseline   Orientation impairments: Place, Time, Situation   Memory impairment (select all impairments): Short-term memory, Working [employer] fast streamer, Non-declarative long-term memory, Geneticist, molecular long-term memory Attention impairment (select first level of impairment): Focused attention Executive functioning impairment  (select all impairments): Problem solving, Sequencing, Initiation, Reasoning, Organization OT - Cognition Comments: Perseverated on re-applying deodorant multiple times, poor STM (requested to use bathroom, then told OT she had "just gone" when she was still sitting in recliner)                                   General Comments Pt found seated in recliner, tying knot in gown tightly around neck for modesty. Pt appearing to have doffed second gown and draped around waist. Care team notified, pt dressed in paper scrubs    Pertinent Vitals/ Pain       Pain Assessment Pain Assessment: No/denies pain         Frequency  Min 2X/week        Progress Toward Goals  OT Goals(current goals can now be found in the care plan section)  Progress towards OT goals: Progressing toward goals  Acute Rehab OT Goals OT Goal Formulation: With patient Time For Goal Achievement: 12/27/23 Potential to Achieve Goals: Good  Plan         AM-PAC OT "6 Clicks" Daily Activity     Outcome Measure   Help from another person eating meals?: None Help from another person taking care of personal grooming?: None Help from another person toileting, which includes using toliet, bedpan, or urinal?: A Little Help from another person bathing (including washing, rinsing, drying)?: A Little Help from another person to put on and taking off regular upper body clothing?: A Little Help from another person to put on and taking off regular lower body clothing?: A Little 6 Click Score: 20  End of Session Equipment Utilized During Treatment: Rolling walker (2 wheels);Gait belt  OT Visit Diagnosis: Other abnormalities of gait and mobility (R26.89);Muscle weakness (generalized) (M62.81)   Activity Tolerance Patient tolerated treatment well   Patient Left in chair;with call bell/phone within reach;with chair alarm set   Nurse Communication Mobility status        Time: 9563-8756 OT Time Calculation  (min): 30 min  Charges: OT General Charges $OT Visit: 1 Visit OT Treatments $Self Care/Home Management : 23-37 mins Narmeen Kerper L. [place], OTR/L  12/25/23, 2:38 PM

## 2023-12-25 NOTE — Progress Notes (Signed)
 Pt very pleasant and cooperative during session. Requires increased time to complete simple tasks. R knee pain upon transferring to EOB with Supervision, however did not affect balance while ambulating in hall with RW and CGA. Pt positioned to comfort in chair with chair alarm and set up for breakfast.   12/25/23 1200  PT Visit Information  Assistance Needed +1  History of Present Illness Pt is a 78 y.o. female presents to the ED via EMS with son called stating that pt was agitated. EMS reports that police were at her home and had them transport her to the ED. Per MD note pt admitted to the ED with plan for psychiatric and TTS evaluation. 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  Subjective Data  Subjective " Im doing just fine."  Patient Stated Goal none stated  Precautions  Precautions Fall  Recall of Precautions/Restrictions Impaired  Restrictions  Weight Bearing Restrictions Per Provider Order No  Pain Assessment  Pain Assessment Faces  Faces Pain Scale 2  Pain Location R knee  Pain Descriptors / Indicators Aching  Pain Intervention(s) Monitored during session  Cognition  Arousal Alert  Behavior During Therapy WFL for tasks assessed/performed  PT - Cognitive impairments Orientation;Awareness;Problem solving;Safety/Judgement;No family/caregiver present to determine baseline  Orientation impairments Place;Situation;Time  PT - Cognition Comments Pt is alert, calm, and cooperative. disoriented to time, situation.  Following Commands  Following commands Impaired  Following commands impaired Follows one step commands inconsistently  Cueing  Cueing Techniques Verbal cues;Tactile cues;Gestural cues  Communication  Communication No apparent difficulties  Bed Mobility  Overal bed mobility Needs Assistance  Bed Mobility Supine to Sit  Supine to sit Contact  guard  General bed mobility comments inc time and cues  Transfers  Overall transfer level Needs assistance  Equipment used Rolling walker (2 wheels)  Transfers Sit to/from Stand  Sit to Stand Supervision  General transfer comment No physical assistance to STS from EOB multiple times during session  Ambulation/Gait  Ambulation/Gait assistance Contact guard assist;Min assist  Gait Distance (Feet) 200 Feet  Assistive device Rolling walker (2 wheels)  Gait Pattern/deviations Step-through pattern;Trunk flexed;Drifts right/left  General Gait Details cues and min a x 1 at times for navigation.  stated she has difficulty seeing due to glaucoma  Gait velocity decreased  Balance  Overall balance assessment Needs assistance  Sitting-balance support Feet supported  Sitting balance-Leahy Scale Good  Standing balance support During functional activity;No upper extremity supported;Single extremity supported;Reliant on assistive device for balance  Standing balance-Leahy Scale Fair  Standing balance comment no LOB with use of RW  General Comments  General comments (skin integrity, edema, etc.) R knee pain, however able to ambulate without favoring  Exercises  Exercises Other exercises  Other Exercises  Other Exercises Edu: Safe DME during transfers/mobility  PT - End of Session  Equipment Utilized During Treatment Gait belt  Activity Tolerance Patient tolerated treatment well  Patient left in bed;with call bell/phone within reach;with bed alarm set  Nurse Communication Mobility status   PT - Assessment/Plan  PT Visit Diagnosis Muscle weakness (generalized) (M62.81);Difficulty in walking, not elsewhere classified (R26.2);Unsteadiness on feet (R26.81)  PT Frequency (ACUTE ONLY) Min 1X/week  Follow Up Recommendations Other (comment)  Patient can return home with the following Supervision due to cognitive status;Assist for transportation;A little help with walking and/or transfers;A little help with  bathing/dressing/bathroom;Assistance with cooking/housework  PT equipment Other (comment) (Defer  to next level of care)  AM-PAC PT "6 Clicks" Mobility Outcome Measure (Version 2)  Help needed turning from your back to your side while in a flat bed without using bedrails? 4  Help needed moving from lying on your back to sitting on the side of a flat bed without using bedrails? 3  Help needed moving to and from a bed to a chair (including a wheelchair)? 3  Help needed standing up from a chair using your arms (e.g., wheelchair or bedside chair)? 3  Help needed to walk in hospital room? 3  Help needed climbing 3-5 steps with a railing?  3  6 Click Score 19  Consider Recommendation of Discharge To: Home with [facility]  Progressive Mobility  What is the highest level of mobility based on the progressive mobility assessment? Level 5 (Walks with assist in room/hall) - Balance while stepping forward/back and can walk in room with assist - Complete  Activity Ambulated with assistance in hallway  PT Goal Progression  Progress towards PT goals Progressing toward goals  PT Time Calculation  PT Start Time (ACUTE ONLY) 1040  PT Stop Time (ACUTE ONLY) 1103  PT Time Calculation (min) (ACUTE ONLY) 23 min  PT General Charges  $$ ACUTE PT VISIT 1 Visit  PT Treatments  $Gait Training 8-22 mins  $Therapeutic Activity 8-22 mins

## 2023-12-25 NOTE — TOC Progression Note (Signed)
 Transition of Care [facility]) - Progression Note    Patient Details  Name: Penny Hart MRN: [id number] Date of Birth: 1946/01/16  [facility]) CM/SW Contact  [place] L Glover [place], [place] Phone Number: 12/25/2023, 2:54 PM  Clinical Narrative:     CSW spoke with patient daughter to advise that [facility]) advised that a bed will be available by Thursday. Family advised that they are in agreement with the placement. CSW spoke with family about the LTC Medicaid and asked if they were in a position to pay the copay until the Medicaid is approved. Family stated that they do not have the money to pay but advised that patient receives SSI and the funds can go to the home. The family states that the long term plan is LTC for patient.   Family advised that they are working with DSS to assist family in obtaining legal guardianship of patient.   Expected Discharge Plan: Skilled Nursing Facility Barriers to Discharge: Continued Medical Work up  Expected Discharge Plan and Services   Discharge Planning Services: CM Consult   Living arrangements for the past 2 months: Apartment                                       Social Determinants of Health (SDOH) Interventions SDOH Screenings   Housing: Unknown (08/01/2023)   Received from [facility]  Transportation Needs: No Transportation Needs (03/10/2023)  Tobacco Use: Medium Risk (12/11/2023)    Readmission Risk Interventions     No data to display

## 2023-12-25 NOTE — ED Provider Notes (Signed)
-----------------------------------------   5:59 AM on 12/25/2023 -----------------------------------------  Blood pressure 110/69, pulse (!) 56, temperature 98.3 F (36.8 C), temperature source Oral, resp. rate 14, height 5\' 5"  (1.651 m), weight 77 kg, SpO2 95%.  The patient is calm and cooperative at this time.  There have been no acute events since the last update.  Awaiting disposition plan from the Social Work team.   Lind Repine, MD 12/25/23 0600

## 2023-12-25 NOTE — ED Notes (Signed)
 Pts bed alarm, alarming. Writer in room to find pt attempting to stand to ambulate to bathroom. Pt assisted to room toilet and back to bed with linens re-positioned and alarm re-activated. Bed in lowest position, bed alarm within reach and lights dimmed per pr request.

## 2023-12-25 NOTE — ED Notes (Signed)
 Vol/ TOC pending placement

## 2023-12-25 NOTE — ED Notes (Signed)
 Pt ABCs intact. RR even and unlabored. Pt in NAD. Bed in lowest locked position. Call bell in reach. Denies needs at this time.

## 2023-12-25 NOTE — ED Notes (Signed)
 Pt still awaiting long term placement. Pt pleasant at this time. Pt ambulated from chair to toilet with fairly steady gait. RN remained at pt side to ensure no fall. Pt was then escorted to her bed, bed alarm set and side rails placed. Pt ABCs intact. RR even and unlabored. Pt in NAD. Bed in lowest locked position. Call bell in reach. Denies needs at this time.   Past Medical History:  Diagnosis Date   Arthritis    CHF (congestive heart failure) (HCC)    COPD (chronic obstructive pulmonary disease) (HCC)    Coronary artery disease    Diabetes mellitus without complication (HCC)    GERD (gastroesophageal reflux disease)    Glaucoma    Headache    Hiatal hernia    Hyperlipidemia    Hypertension    Neuropathy    Obesity    Peripheral neuropathy    Pseudophakia    Sleep apnea    Spinal stenosis    Spondylolisthesis of lumbar region    Thoracic aortic atherosclerosis (HCC)

## 2023-12-25 NOTE — ED Notes (Signed)
 Pt resting comfortably in bed. Pt ABCs intact. RR even and unlabored. Pt in NAD. Bed in lowest locked position. Call bell in reach. Denies needs at this time. Bed alarms set.

## 2023-12-25 NOTE — TOC Progression Note (Signed)
 Transition of Care [facility]) - Progression Note    Patient Details  Name: Penny Hart MRN: [id number] Date of Birth: 1945/07/27  Transition of Care [facility]) CM/SW Contact  Arminda Landmark, RN Phone Number: 12/25/2023, 1:07 PM  Clinical Narrative:    [facility] has accepted this pt but hasn't had a bed. Called [facility] and left a message to ask if they have her bed ready yet. TOC to continue to follow.    Expected Discharge Plan: Skilled Nursing Facility Barriers to Discharge: Continued Medical Work up  Expected Discharge Plan and Services   Discharge Planning Services: CM Consult   Living arrangements for the past 2 months: Apartment                                       Social Determinants of Health (SDOH) Interventions SDOH Screenings   Housing: Unknown (08/01/2023)   Received from [facility]  Transportation Needs: No Transportation Needs (03/10/2023)  Tobacco Use: Medium Risk (12/11/2023)    Readmission Risk Interventions     No data to display

## 2023-12-25 NOTE — ED Notes (Signed)
 Breakfast tray given.

## 2023-12-26 MED ORDER — HALOPERIDOL LACTATE 5 MG/ML IJ SOLN
5.0000 mg | Freq: Once | INTRAMUSCULAR | Status: AC
  Administered 2023-12-26: 5 mg via INTRAMUSCULAR
  Filled 2023-12-26: qty 1

## 2023-12-26 MED ORDER — LORAZEPAM 2 MG/ML IJ SOLN
1.0000 mg | Freq: Once | INTRAMUSCULAR | Status: AC
  Administered 2023-12-26: 1 mg via INTRAMUSCULAR
  Filled 2023-12-26: qty 1

## 2023-12-26 NOTE — ED Notes (Addendum)
 Pt's bed alarm went off and upon entering the room the pt was standing by the bed stating she was looking for the remote. The pt was shown the remote was in the bed. Pt stated she was going to sit on the bench seat. Pt sat on the bench seat and stated it was too hard. Pt was placed back into her bed and bed alarm was turned on.

## 2023-12-26 NOTE — ED Notes (Signed)
 Pt placed herself from recliner onto floor. This RN and Lawyer at bedside. Pt assisted to bed. Siderails up and bed alarm on. High fall risk bundle remains in place.

## 2023-12-26 NOTE — ED Notes (Signed)
 Bed alarm sounding. Pt out of bed. Pt to door and standing in hallway. Pt yelling and not redirectable at this time. RN attempting to redirect pt back into room but pt getting more agitated and making verbal threats and yelling profanity at staff. Verbal given for PRN medication for agitation.

## 2023-12-26 NOTE — ED Notes (Signed)
 RN at bedside. Pt attempting to get out of bed multiple times. Pt at doorway and swatting at staff. Pt assisted back in bed. High fall risk bundle is maintained.

## 2023-12-26 NOTE — ED Notes (Signed)
 Bed alarm went off and the pt was noted to be playing with the posey matt "staying she just wanted to look at it and was counting her money" Pt was given a posey therapy apron at this time.

## 2023-12-26 NOTE — ED Provider Notes (Signed)
 Emergency Medicine Observation Re-evaluation Note  Physical Exam   BP 112/66 (BP Location: Right Arm)   Pulse (!) 56   Temp 98.4 F (36.9 C) (Oral)   Resp 16   Ht 5\' 5"  (1.651 m)   Wt 77 kg   SpO2 95%   BMI 28.25 kg/m   Patient appears in no acute distress.  ED Course / MDM   No reported events during my shift at the time of this note.   Pt is awaiting dispo from consultants   Buell Carmin MD    Buell Carmin, MD 12/26/23 0300

## 2023-12-27 MED ORDER — LAMOTRIGINE 25 MG PO TABS
50.0000 mg | ORAL_TABLET | Freq: Two times a day (BID) | ORAL | Status: DC
  Administered 2023-12-27 – 2024-01-04 (×17): 50 mg via ORAL
  Filled 2023-12-27 (×16): qty 2

## 2023-12-27 NOTE — ED Notes (Signed)
 Bed fall alarm turned off due to pt continuing to walk around.

## 2023-12-27 NOTE — ED Notes (Signed)
Pt up working with PT.

## 2023-12-27 NOTE — Progress Notes (Signed)
 Mobility Specialist - Progress Note     12/27/23 1525  Mobility  Activity Ambulated with assistance in hallway  Level of Assistance Contact guard assist, steadying assist  Assistive Device Front wheel walker  Distance Ambulated (ft) 80 ft  Range of Motion/Exercises Active  Activity Response Tolerated well  Mobility Referral Yes  Mobility visit 1 Mobility  Mobility Specialist Start Time (ACUTE ONLY) 1503  Mobility Specialist Stop Time (ACUTE ONLY) 1525  Mobility Specialist Time Calculation (min) (ACUTE ONLY) 22 min   Pt standing in room with NT upon entry on RA. Pt ambulates to hallway around NS CGA with RW. Pt required verbal and manual direction cuing with walker and constant cuing to keep her eyes open. Pt was unable to see objects in front of her even with both eyes open (RN notified). Pt returned to recliner and left with needs in reach. Chair alarm activated.   Jerri Morale Mobility Specialist 12/27/23, 3:29 PM

## 2023-12-27 NOTE — ED Provider Notes (Signed)
-----------------------------------------   6:07 AM on 12/27/2023 -----------------------------------------   Blood pressure 110/72, pulse 60, temperature 98.1 F (36.7 C), temperature source Oral, resp. rate 17, height 1.651 m (5\' 5" ), weight 77 kg, SpO2 96%.  The patient is calm and cooperative at this time.  There have been no acute events since the last update.  Awaiting disposition plan from [facility] team.   Lynnda Sas, MD 12/27/23 [phone]

## 2023-12-27 NOTE — Progress Notes (Signed)
 Occupational Therapy Re-Evaluation Patient Details Name: Penny Hart MRN: [id number] DOB: June 24, 1946 Today's Date: 12/27/2023   History of Present Illness   Pt is a 78 y.o. female presents to the ED via EMS with son called stating that pt was agitated. EMS reports that police were at her home and had them transport her to the ED. Per MD note pt admitted to the ED with plan for psychiatric and TTS evaluation. 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     Clinical Impressions Pt received in bed, asleep and easily awakens to voice. Pt frequently repeats self, asking about "the dog" and wanting to know who is in "the living room". Poor STM, OT provides reorientation and pt continues to ask where she is. Bed mobility performed with supervision, dons socks with MIN A, hand-over-hand required to locate RW once standing, max verbal cues for safe ambulation in room t/f commode and pt with poor adherence to direction, MAX A to safely pivot hips on commode as she attempts to sit too early. OT sets up lunch tray, cuts up lunch into manageable bites due to pt's visual deficits, and orients pt to food items on tray. RN updated on pt's position. Pt making progress towards goals, will require +1 hands on assist for mobility/ADLs at next LOC.      If plan is discharge home, recommend the following:   A little help with walking and/or transfers;A little help with bathing/dressing/bathroom;Assistance with cooking/housework;Assist for transportation;Help with stairs or ramp for entrance;Direct supervision/assist for financial management;Supervision due to cognitive status;Direct supervision/assist for medications management      Equipment Recommendations   Other (comment)      Precautions/Restrictions   Precautions Precautions: Fall Recall of Precautions/Restrictions:  Impaired Precaution/Restrictions Comments: vision impaired Restrictions Weight Bearing Restrictions Per Provider Order: No     Mobility Bed Mobility Overal bed mobility: Needs Assistance Bed Mobility: Supine to Sit     Supine to sit: Contact guard       Patient Response: Cooperative  Transfers Overall transfer level: Needs assistance Equipment used: Rolling walker (2 wheels) Transfers: Sit to/from Stand Sit to Stand: Supervision           General transfer comment: hand over hand assist for locating RW in standing      Balance Overall balance assessment: Needs assistance Sitting-balance support: Feet supported Sitting balance-Leahy Scale: Good     Standing balance support: During functional activity, No upper extremity supported, Single extremity supported, Reliant on assistive device for balance Standing balance-Leahy Scale: Fair                             ADL either performed or assessed with clinical judgement   ADL Overall ADL's : Needs assistance/impaired Eating/Feeding: Cueing for compensatory techinques;Cueing for sequencing;Minimal assistance;Sitting Eating/Feeding Details (indicate cue type and reason): OT set up tray due to vision deficits                     Toilet Transfer: Ambulation;Rolling walker (2 wheels);Cueing for sequencing;Regular Toilet;Cueing for safety;Minimal assistance;Maximal assistance Toilet Transfer Details (indicate cue type and reason): max cuing, pt attempting to sit halfway through transfer (not near commode), requiring maxA to pivot hips safely on commode Toileting- Clothing Manipulation and Hygiene: Sitting/lateral lean;Sit to/from stand;Minimal assistance Toileting - Clothing Manipulation Details (indicate cue type and reason): lateral leans, cannot  locate toilet paper with verbal cues, OT hands pt paper to wipe              Pertinent Vitals/Pain Pain Assessment Pain Assessment: No/denies pain         Communication Communication Communication: No apparent difficulties   Cognition Arousal: Alert Behavior During Therapy: WFL for tasks assessed/performed Cognition: Cognition impaired, History of cognitive impairments, No family/caregiver present to determine baseline   Orientation impairments: Place, Time, Situation   Memory impairment (select all impairments): Short-term memory, Working [employer] fast streamer, Non-declarative long-term memory, Geneticist, molecular long-term memory Attention impairment (select first level of impairment): Focused attention Executive functioning impairment (select all impairments): Problem solving, Sequencing, Initiation, Reasoning, Organization OT - Cognition Comments: mildly impulsive, does not want to wait for direction, poor safety awareness due to vision deficits                 Following commands: Impaired Following commands impaired: Follows one step commands inconsistently     Cueing  General Comments   Cueing Techniques: Verbal cues;Tactile cues;Gestural cues  RN notified of pt's position and urine void             OT Goals(Current goals can be found in the care plan section)   Acute Rehab OT Goals OT Goal Formulation: With patient Time For Goal Achievement: 01/03/24 Potential to Achieve Goals: Good   OT Frequency:  Min 2X/week       AM-PAC OT "6 Clicks" Daily Activity     Outcome Measure Help from another person eating meals?: A Little Help from another person taking care of personal grooming?: A Little Help from another person toileting, which includes using toliet, bedpan, or urinal?: A Little Help from another person bathing (including washing, rinsing, drying)?: A Little Help from another person to put on and taking off regular upper body clothing?: A Little Help from another person to put on and taking off regular lower body clothing?: A Little 6 Click Score: 18   End of Session Equipment Utilized During Treatment: Rolling  walker (2 wheels) Nurse Communication: Mobility status  Activity Tolerance: Patient tolerated treatment well Patient left: in chair;with call bell/phone within reach;with chair alarm set  OT Visit Diagnosis: Other abnormalities of gait and mobility (R26.89);Muscle weakness (generalized) (M62.81)                Time: 1191-4782 OT Time Calculation (min): 15 min Charges:  OT General Charges $OT Visit: 1 Visit OT Evaluation $OT Re-eval: 1 Re-eval OT Treatments $Self Care/Home Management : 8-22 mins Annelies Coyt L. Layana Konkel, OTR/L  12/27/23, 1:46 PM

## 2023-12-27 NOTE — ED Notes (Addendum)
 Pt continues to walk around room. Continuously needing redirection. Pt has not sat down in last 4 hours.

## 2023-12-27 NOTE — ED Notes (Signed)
 Pt assisted to the restroom and then placed back in bed. Offered pt morning medications and breakfast and pt refusing at this time. States after she wakes up she will take them when she eats breakfast.

## 2023-12-27 NOTE — ED Notes (Signed)
 Pt daughter, Ammon Bales, states "I do not want her going to the facility they are planning on sending her to tomorrow, we spoke with the social worker today and are working to find another. I need them to call me in the morning before moving her. I was not here before 5 today to speak with her but we do not want her going there"

## 2023-12-27 NOTE — TOC Progression Note (Addendum)
 Transition of Care [facility]) - Progression Note    Patient Details  Name: KAZUE CERRO MRN: [id number] Date of Birth: 04-02-1946  [facility]) CM/SW Contact  Arminda Landmark, RN Phone Number: 12/27/2023, 10:12 AM  Clinical Narrative:    Spoke with Fabian Holster from [facility] and they can take Ebonye sometime this afternoon. Waiting to hear back about the time and room #. 1030: Spoke with Fabian Holster and they can accept Ameah today in room 1A, and call report to [phone]. 1050: Called and left a message with her DSS worker Gareth Junes about her DC today. 1110: Fabian Holster here in person and they have to start auth for pt to go to [facility] so will be on hold for now. Bedside nurse notified. TOC to continue to follow.    Expected Discharge Plan: Skilled Nursing Facility Barriers to Discharge: Continued Medical Work up  Expected Discharge Plan and Services   Discharge Planning Services: CM Consult   Living arrangements for the past 2 months: Apartment                                       Social Determinants of Health (SDOH) Interventions SDOH Screenings   Housing: Unknown (08/01/2023)   Received from [facility]  Transportation Needs: No Transportation Needs (03/10/2023)  Tobacco Use: Medium Risk (12/11/2023)    Readmission Risk Interventions     No data to display

## 2023-12-27 NOTE — ED Notes (Signed)
 Pt offered lunch tray, refused at this time. Pt states she is tired and would like to go back to sleep.

## 2023-12-28 MED ORDER — HALOPERIDOL LACTATE 5 MG/ML IJ SOLN
5.0000 mg | Freq: Once | INTRAMUSCULAR | Status: AC
  Administered 2023-12-28: 5 mg via INTRAMUSCULAR
  Filled 2023-12-28: qty 1

## 2023-12-28 NOTE — Progress Notes (Signed)
 OT Cancellation Note  Patient Details Name: Penny Hart MRN: [id number] DOB: [birth year]   Cancelled Treatment:    Reason Eval/Treat Not Completed: Fatigue/lethargy limiting ability to participate. Pt sleeping soundly, head under covers. Does not wake to verbal stimuli. Will re-attempt at later date/time as able.  Daveyon Kitchings R., MPH, MS, OTR/L ascom [phone] 12/28/23, 11:35 AM

## 2023-12-28 NOTE — ED Notes (Signed)
 Patient up to toilet and then recliner. Tray set up and extra light provided. Pt feeding self. Fall bundle maintained. Will continue to monitor.

## 2023-12-28 NOTE — ED Notes (Signed)
 Pt up ambulatory to the toilet. 1000 meds given late due to patient sleeping.

## 2023-12-28 NOTE — ED Notes (Signed)
 Patient assisted back to bed. Fall alarm on. Blankets provided. Will continue to monitor.

## 2023-12-28 NOTE — TOC Progression Note (Addendum)
 Transition of Care [facility]) - Progression Note    Patient Details  Name: Penny Hart MRN: [id number] Date of Birth: 03-07-1946  Transition of Care [facility]) CM/SW Contact  Arminda Landmark, RN Phone Number: 12/28/2023, 10:49 AM  Clinical Narrative:    Spoke with her daughter Ammon Bales and the family doesn't want her to go to [facility]. Ammon Bales spoke to Arnetra's SW from DSS [place] yesterday, and Beauford Bounds told her the pt doesn't have to go to [place], and that they have a choice as to where she goes. Ammon Bales says she didn't realize they had a choice which is why she didn't speak up sooner. Reminded her that Ely's been here for 17 days and it's not good for her mental or physical wellbeing to continue staying in the hospital. Ammon Bales wants to choose a place for pt and is asking if she can stay here longer. Discussed she will need to find a LTC that's willing to use her current form of medicaid while waiting for her LTC medicaid to kick in and [facility] verbalized understanding. 1300: Spoke with Aisha Hove and she's not seeing this pt has medicaid- which she needs for LTC following SNF. Had a phone conference with her daughter Ammon Bales, granddaughter, and this RNCM. Discussed they are welcome to look for another facility but that she's been declined for approximately 34 facilities we've sent her info to. Also, medicare is requesting a peer to peer and Dr. Peggi Bowels will be calling about this. TOC to continue to follow.    Expected Discharge Plan: Skilled Nursing Facility Barriers to Discharge: Continued Medical Work up  Expected Discharge Plan and Services   Discharge Planning Services: CM Consult   Living arrangements for the past 2 months: Apartment                                       Social Determinants of Health (SDOH) Interventions SDOH Screenings   Housing: Unknown (08/01/2023)   Received from [facility]  Transportation Needs: No Transportation Needs  (03/10/2023)  Tobacco Use: Medium Risk (12/11/2023)    Readmission Risk Interventions     No data to display

## 2023-12-28 NOTE — ED Notes (Addendum)
 Patient son Penny Hart called asking why his mother was in the ER. He was informed that shw has been here 17 days and has not left the ER. He stated why. I informed him that [facility] accepted the patient however pts daughter Penny Hart refused the facility and now case mgt is awaiting correlation with Penny Hart on choosing a place. I encouraged him to reach out to his sister on the same and aid in choosing a facility for the patient to be DC to.

## 2023-12-28 NOTE — ED Notes (Signed)
 Patient assisted back to bed after ambulating to toilet. Bed alarm, socks, and bracelet on. Will continue to monitor.

## 2023-12-28 NOTE — ED Provider Notes (Signed)
 Emergency Medicine Observation Re-evaluation Note  Physical Exam   BP 128/79   Pulse 75   Temp 98.4 F (36.9 C)   Resp 16   Ht 5\' 5"  (1.651 m)   Wt 77 kg   SpO2 93%   BMI 28.25 kg/m   Patient appears in no acute distress.  ED Course / MDM   No reported events during my shift at the time of this note.   Pt is awaiting dispo from consultants   Buell Carmin MD    Buell Carmin, MD 12/28/23 [phone]

## 2023-12-28 NOTE — ED Notes (Signed)
 Responded to bed fall alarm. Pt redirected and assisted back into bed by Arianne, EDT.

## 2023-12-28 NOTE — ED Notes (Signed)
 Patient up to recliner. Fall alarm on. Patient provided peach cup and apple juice. Hair comb provided.

## 2023-12-28 NOTE — ED Notes (Signed)
 Patient up in room standing at the wall stating it was a door and some woman keeps coming in. Patient reoriented and unable to agree with orientation attempts stating she is at home and someone is breaking in her house. Patient was assisted back to the bed and encouraged to rest r/t time of day. Patient given apple juice per request. Fall bundle in place. Will continue to monitor.

## 2023-12-28 NOTE — Progress Notes (Signed)
 Physical Therapy Treatment Patient Details Name: Penny Hart MRN: [id number] DOB: [birth year] Today's Date: 12/28/2023   History of Present Illness Pt is a 78 y.o. female presents to the ED via EMS with son called stating that pt was agitated. EMS reports that police were at her home and had them transport her to the ED. Per MD note pt admitted to the ED with plan for psychiatric and TTS evaluation. 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    PT Comments  PT was sitting in recliner upon arrival. She is alert, calm, and just finishing breakfast. Pt endorses low back pain and Rt knee pain however remains cooperative throughout. She does not require physical lifting assistance to stand however does require assistance throughout session due to severity of vision deficits and need for propelling RW to safely navigate room/halls/around furniture. No LOB but constant vcs for posture correction and improved heel strike. Poor gait posture and ability to maintain good/upright/ erect posture. PT will continue to follow per current POC. Pt will benefit from 24/7 supervision/assist at DC due to cognition and vision impairments.    If plan is discharge home, recommend the following: Supervision due to cognitive status;Assist for transportation;A little help with walking and/or transfers;A little help with bathing/dressing/bathroom;Assistance with cooking/housework     Equipment Recommendations  Other (comment) (Pt endorses having RW already)       Precautions / Restrictions Precautions Precautions: Fall Recall of Precautions/Restrictions: Impaired Precaution/Restrictions Comments: vision impaired Restrictions Weight Bearing Restrictions Per Provider Order: Yes     Mobility  Bed Mobility Overal bed mobility: Needs Assistance Bed Mobility: Sit to Supine  Sit to supine:  Min assist, Mod assist General bed mobility comments: pt was in recliner upon arrival. Pt struggles with motor planning to achieve long sitting in bed at conclusion of session.    Transfers Overall transfer level: Needs assistance Equipment used: Rolling walker (2 wheels) Transfers: Sit to/from Stand Sit to Stand: Contact guard assist, Supervision  General transfer comment: no physical lifting assistance required to stand    Ambulation/Gait Ambulation/Gait assistance: Contact guard assist, Min assist Gait Distance (Feet): 70 Feet Assistive device: Rolling walker (2 wheels) Gait Pattern/deviations: Step-through pattern, Trunk flexed Gait velocity: decreased  General Gait Details: RLE toe out. needs min assist to navigate halls due to vision deficits    Balance Overall balance assessment: Needs assistance Sitting-balance support: Feet supported Sitting balance-Leahy Scale: Good     Standing balance support: During functional activity, Reliant on assistive device for balance Standing balance-Leahy Scale: Fair Standing balance comment: pt remains at risk of falls due to vision deficits and impulsivity at times. pt was calm and able to follow commans throughout this session however does have extensive hx of agitation       Communication Communication Communication: No apparent difficulties  Cognition Arousal: Alert Behavior During Therapy: WFL for tasks assessed/performed   PT - Cognitive impairments: History of cognitive impairments    PT - Cognition Comments: Pt is A and calm this session. does have poor overall awareness of situation. Is able to consistently follow commands. vision deficits greatly impacts pt's progress Following commands: Impaired Following commands impaired: Only follows one step commands consistently    Cueing Cueing Techniques: Verbal cues, Tactile cues         Pertinent Vitals/Pain Pain Assessment Pain Assessment: PAINAD Breathing:  normal Negative Vocalization: occasional moan/groan, low speech, negative/disapproving quality Facial  Expression: smiling or inexpressive Body Language: relaxed Consolability: distracted or reassured by voice/touch PAINAD Score: 2 Pain Location: R knee/LBP Pain Descriptors / Indicators: Aching Pain Intervention(s): Limited activity within patient's tolerance, Monitored during session, Premedicated before session, Repositioned     PT Goals (current goals can now be found in the care plan section) Acute Rehab PT Goals Patient Stated Goal: " I want my sorry daughter to come get me." Progress towards PT goals: Progressing toward goals    Frequency    Min 1X/week       AM-PAC PT "6 Clicks" Mobility   Outcome Measure  Help needed turning from your back to your side while in a flat bed without using bedrails?: None Help needed moving from lying on your back to sitting on the side of a flat bed without using bedrails?: A Little Help needed moving to and from a bed to a chair (including a wheelchair)?: A Little Help needed standing up from a chair using your arms (e.g., wheelchair or bedside chair)?: A Little Help needed to walk in hospital room?: A Little Help needed climbing 3-5 steps with a railing? : A Little 6 Click Score: 19    End of Session   Activity Tolerance: Patient tolerated treatment well Patient left: in bed;with call bell/phone within reach;with bed alarm set;with nursing/sitter in room Nurse Communication: Mobility status PT Visit Diagnosis: Muscle weakness (generalized) (M62.81);Difficulty in walking, not elsewhere classified (R26.2);Unsteadiness on feet (R26.81)     Time: 1610-9604 PT Time Calculation (min) (ACUTE ONLY): 19 min  Charges:    $Therapeutic Activity: 8-22 mins PT General Charges $$ ACUTE PT VISIT: 1 Visit                     Chester Costa PTA 12/28/23, 9:15 AM

## 2023-12-28 NOTE — ED Provider Notes (Signed)
 Peer to Peer complete.    Lubertha Rush, MD 12/28/23 [phone]

## 2023-12-29 MED ORDER — HALOPERIDOL LACTATE 5 MG/ML IJ SOLN
5.0000 mg | Freq: Once | INTRAMUSCULAR | Status: AC
  Administered 2023-12-29: 5 mg via INTRAMUSCULAR
  Filled 2023-12-29: qty 1

## 2023-12-29 NOTE — ED Notes (Signed)
 A female staff member from a group home up to first nurse desk stating he is here to do an assessment on pt, primary RN notified that he is on the way to pt room.

## 2023-12-29 NOTE — ED Notes (Signed)
 This RN attempting to get vitals on this pt. Pt refusing stating, "Yall don't need to get no vitals on me. Why are you jealous of me? They know I didn't do it, she told me that, she told me. Jealous of me. Jealous crueler than the grave. Get away from me." RN attempted to reorient pt to hospital. Pt continuing to mumble.

## 2023-12-29 NOTE — ED Notes (Signed)
 Pt sat up for lunch at this time. Pt eating lunch with no assistance.

## 2023-12-29 NOTE — TOC Progression Note (Signed)
 Transition of Care [facility]) - Progression Note    Patient Details  Name: KERIANA SARSFIELD MRN: [id number] Date of Birth: [birth year]  [facility]) CM/SW Contact  [place] L Tinsleigh [place], [place] Phone Number: 12/29/2023, 10:43 AM  Clinical Narrative:     CSW spoke with relative, Ammon Bales, to inquire about discharge planning. Ammon Bales stated that she did not want to take the bed at [facility] because DSS told her that she has the right to look around. CSW confirmed this however CSW advised that patient has been denied a bed for over 30 facilities. Ammon Bales stated that she and her family had talked about turning patient over to the state. She stated that this is not what she wants to do but she advised that she sees no other option. Patient had her own home but the family packed up her belongings and ended the lease. Pt does not have a home to go to. Ammon Bales stated that they tried to move patient home with her but that arrangement did not last more than a few hours and pt was returned to the ED.   Expected Discharge Plan: Skilled Nursing Facility Barriers to Discharge: Continued Medical Work up  Expected Discharge Plan and Services   Discharge Planning Services: CM Consult   Living arrangements for the past 2 months: Apartment                                       Social Determinants of Health (SDOH) Interventions SDOH Screenings   Housing: Unknown (08/01/2023)   Received from [facility]  Transportation Needs: No Transportation Needs (03/10/2023)  Tobacco Use: Medium Risk (12/11/2023)    Readmission Risk Interventions     No data to display

## 2023-12-29 NOTE — ED Notes (Signed)
 Pt standing up in hallway getting aggressive with staff. Pt attempting to hit, bite, and spit at staff. Vallery Gavel MD made aware and at bedside. Haldol  ordered.

## 2023-12-29 NOTE — TOC Progression Note (Signed)
 Transition of Care [facility]) - Progression Note    Patient Details  Name: Penny Hart MRN: [id number] Date of Birth: 09/02/1945  Transition of Care [facility]) CM/SW Contact  [place] L Hrithik [place], [place] Phone Number: 12/29/2023, 2:47 PM  Clinical Narrative:     CSW spoke with Marcello Sero, owner of a group home. He was here to assess patient for one of his group homes. FL2 sent for review.   Expected Discharge Plan: Skilled Nursing Facility Barriers to Discharge: Continued Medical Work up  Expected Discharge Plan and Services   Discharge Planning Services: CM Consult   Living arrangements for the past 2 months: Apartment                                       Social Determinants of Health (SDOH) Interventions SDOH Screenings   Housing: Unknown (08/01/2023)   Received from [facility]  Transportation Needs: No Transportation Needs (03/10/2023)  Tobacco Use: Medium Risk (12/11/2023)    Readmission Risk Interventions     No data to display

## 2023-12-29 NOTE — ED Provider Notes (Addendum)
-----------------------------------------   5:23 AM on 12/29/2023 -----------------------------------------   Blood pressure (!) 109/56, pulse 62, temperature 98.6 F (37 C), temperature source Oral, resp. rate 16, height 5\' 5"  (1.651 m), weight 77 kg, SpO2 96%.  The patient is calm and cooperative at this time.  There have been no acute events since the last update.  Awaiting disposition plan from Social Work team.   Usman Millett J, MD 12/29/23 [phone]   ----------------------------------------- 6:45 AM on 12/29/2023 -----------------------------------------   Patient agitated, wandered out of her room into the hallway, unable to be verbally redirected. Requiring IM calming agent.   Ruhan Borak J, MD 12/29/23 [phone]

## 2023-12-29 NOTE — Progress Notes (Signed)
 Occupational Therapy Treatment Patient Details Name: Penny Hart MRN: [id number] DOB: 09-04-1945 Today's Date: 12/29/2023   History of present illness Pt is a 78 y.o. female presents to the ED via EMS with son called stating that pt was agitated. EMS reports that police were at her home and had them transport her to the ED. Per MD note pt admitted to the ED with plan for psychiatric and TTS evaluation. 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   OT comments  Upon entering the room, pt supine in bed with RN present to give medications. Pt's cognition fluctuates during the day per staff report. Pt declines toileting needs. Pt remains confused and believes we are heading to church to a funeral. Pt refusing all out of bed tasks. She does wash face with min A in preparation of "funeral". Pt repositions self in bed but does not want therapist to assist. Session terminated as pt would not engage further with therapist. Call bell and all needed items within reach upon exiting the room.       If plan is discharge home, recommend the following:  A little help with walking and/or transfers;A little help with bathing/dressing/bathroom;Assistance with cooking/housework;Assist for transportation;Help with stairs or ramp for entrance;Direct supervision/assist for financial management;Supervision due to cognitive status;Direct supervision/assist for medications management   Equipment Recommendations  None recommended by OT       Precautions / Restrictions Precautions Precautions: Fall Recall of Precautions/Restrictions: Impaired Precaution/Restrictions Comments: vision impaired       Mobility Bed Mobility Overal bed mobility: Needs Assistance Bed Mobility: Rolling Rolling: Min assist              Transfers                         Balance Overall  balance assessment: Needs assistance Sitting-balance support: Feet supported Sitting balance-Leahy Scale: Good     Standing balance support: During functional activity, Reliant on assistive device for balance Standing balance-Leahy Scale: Fair Standing balance comment: pt remains at risk of falls due to vision deficits and impulsivity at times. pt was calm and able to follow commans throughout this session however does have extensive hx of agitation                           ADL either performed or assessed with clinical judgement   ADL Overall ADL's : Needs assistance/impaired     Grooming: Cueing for sequencing;Cueing for safety;Wash/dry face;Bed level;Minimal assistance                                      Extremity/Trunk Assessment                       Communication Communication Communication: No apparent difficulties   Cognition Arousal: Alert Behavior During Therapy: WFL for tasks assessed/performed Cognition: Cognition impaired, History of cognitive impairments, No family/caregiver present to determine baseline   Orientation impairments: Place, Time, Situation   Memory impairment (select all impairments): Short-term memory, Working [employer] fast streamer, Non-declarative long-term memory, Geneticist, molecular long-term memory Attention impairment (select first level of impairment): Focused attention Executive functioning impairment (select all impairments): Problem solving, Sequencing, Initiation, Reasoning, Organization  Following commands: Impaired Following commands impaired: Only follows one step commands consistently      Cueing   Cueing Techniques: Verbal cues, Tactile cues  Exercises              Pertinent Vitals/ Pain       Pain Assessment Pain Assessment: Faces Faces Pain Scale: No hurt         Frequency  Min 1X/week        Progress Toward Goals  OT Goals(current goals can now be found in the care plan  section)  Progress towards OT goals: Progressing toward goals      AM-PAC OT "6 Clicks" Daily Activity     Outcome Measure   Help from another person eating meals?: A Little Help from another person taking care of personal grooming?: A Little Help from another person toileting, which includes using toliet, bedpan, or urinal?: A Little Help from another person bathing (including washing, rinsing, drying)?: A Little Help from another person to put on and taking off regular upper body clothing?: A Little Help from another person to put on and taking off regular lower body clothing?: A Little 6 Click Score: 18    End of Session    OT Visit Diagnosis: Other abnormalities of gait and mobility (R26.89);Muscle weakness (generalized) (M62.81)   Activity Tolerance Other (comment) (limited secondary to cognition)   Patient Left in bed;with call bell/phone within reach;with bed alarm set   Nurse Communication Mobility status        Time: 1191-4782 OT Time Calculation (min): 11 min  Charges: OT General Charges $OT Visit: 1 Visit OT Treatments $Therapeutic Activity: 8-22 mins  George Kinder, MS, OTR/L , CBIS ascom [phone]  12/29/23, 3:14 PM

## 2023-12-29 NOTE — TOC Progression Note (Signed)
 Transition of Care [facility]) - Progression Note    Patient Details  Name: Penny Hart MRN: [id number] Date of Birth: 10-14-1945  Transition of Care [facility]) CM/SW Contact  [place] L Homar [place], [place] Phone Number: 12/29/2023, 9:50 AM  Clinical Narrative:     CSW spoke with [facility], regarding an update on peer to peer review. Fabian Holster advised that he has not received any word but advised that Ms. Cassity is no longer on his board to place.    Expected Discharge Plan: Skilled Nursing Facility Barriers to Discharge: Continued Medical Work up  Expected Discharge Plan and Services   Discharge Planning Services: CM Consult   Living arrangements for the past 2 months: Apartment                                       Social Determinants of Health (SDOH) Interventions SDOH Screenings   Housing: Unknown (08/01/2023)   Received from [facility]  Transportation Needs: No Transportation Needs (03/10/2023)  Tobacco Use: Medium Risk (12/11/2023)    Readmission Risk Interventions     No data to display

## 2023-12-30 LAB — BASIC METABOLIC PANEL WITH GFR
Anion gap: 6 (ref 5–15)
BUN: 11 mg/dL (ref 8–23)
CO2: 27 mmol/L (ref 22–32)
Calcium: 9.2 mg/dL (ref 8.9–10.3)
Chloride: 105 mmol/L (ref 98–111)
Creatinine, Ser: 0.9 mg/dL (ref 0.44–1.00)
GFR, Estimated: >60 mL/min (ref >60)
Glucose, Bld: 93 mg/dL (ref 70–99)
Potassium: 3.4 mmol/L — ABNORMAL LOW (ref 3.5–5.1)
Sodium: 138 mmol/L (ref 135–145)

## 2023-12-30 NOTE — ED Provider Notes (Signed)
-----------------------------------------   5:31 AM on 12/30/2023 -----------------------------------------   Blood pressure 109/64, pulse (!) 59, temperature 98.3 F (36.8 C), temperature source Oral, resp. rate 18, height 5\' 5"  (1.651 m), weight 77 kg, SpO2 94%.  The patient is calm and cooperative at this time.  There have been no acute events since the last update.  Awaiting disposition plan from Social Work team.   Ellis Mehaffey J, MD 12/30/23 [phone]

## 2023-12-30 NOTE — ED Notes (Signed)
 Ambulated pt to the bathroom in the room.

## 2023-12-30 NOTE — ED Notes (Signed)
 VOL  TOC  PLACEMENT

## 2023-12-30 NOTE — ED Notes (Signed)
 Vol toc placement

## 2023-12-30 NOTE — ED Notes (Signed)
 Visitor at bedside.

## 2023-12-30 NOTE — ED Notes (Signed)
 Pt was assisted to the bathroom with only directional assistance. Pt was steady on feet. Pt was also guided back to bed.

## 2023-12-31 MED ORDER — ACETAMINOPHEN 325 MG PO TABS
650.0000 mg | ORAL_TABLET | Freq: Once | ORAL | Status: AC
  Administered 2023-12-31: 650 mg via ORAL
  Filled 2023-12-31: qty 2

## 2023-12-31 NOTE — ED Provider Notes (Signed)
-----------------------------------------   5:51 AM on 12/31/2023 -----------------------------------------   Blood pressure 120/70, pulse 71, temperature 98.5 F (36.9 C), temperature source Oral, resp. rate 16, height 5\' 5"  (1.651 m), weight 77 kg, SpO2 91%.  The patient is calm and cooperative at this time.  There have been no acute events since the last update.  Awaiting disposition plan from case management/social work.    Avinash Maltos, Clover Dao, DO 12/31/23 [phone]

## 2023-12-31 NOTE — ED Notes (Signed)
 Introduced self to pt. Turned pt's tv on for her.

## 2023-12-31 NOTE — ED Notes (Signed)
 Pt assisted to toilet in rm with 1 staff assist. Pt urinated and hand hygiene preformed. Pt placed back in bed. Call light within reach.

## 2023-12-31 NOTE — ED Notes (Signed)
 Pt given ice cream for a snack.

## 2023-12-31 NOTE — ED Notes (Signed)
Patient is vol pending TOC placement 

## 2023-12-31 NOTE — ED Notes (Signed)
 NT assisted patient to bathroom. Then back into bed.

## 2023-12-31 NOTE — ED Notes (Addendum)
 RN transferred patient to recliner per request. Fall alarm placed and workgin. Patient has non-skid socks in place as well.

## 2023-12-31 NOTE — ED Notes (Signed)
 Pt up to use restroom, assisted to commode by this RN. Informed pt to pull call bell when finished to assist back to bed.

## 2023-12-31 NOTE — ED Notes (Addendum)
 Two family members arrived to see patient.

## 2023-12-31 NOTE — ED Notes (Signed)
 Ambulated pt to the toilet in her room and applied hospital socks.

## 2024-01-01 MED ORDER — MIDAZOLAM HCL 2 MG/2ML IJ SOLN
1.0000 mg | Freq: Once | INTRAMUSCULAR | Status: AC
  Administered 2024-01-01: 1 mg via INTRAMUSCULAR
  Filled 2024-01-01: qty 2

## 2024-01-01 MED ORDER — HALOPERIDOL LACTATE 5 MG/ML IJ SOLN
5.0000 mg | Freq: Once | INTRAMUSCULAR | Status: AC
  Administered 2024-01-01: 5 mg via INTRAMUSCULAR
  Filled 2024-01-01: qty 1

## 2024-01-01 MED ORDER — ZIPRASIDONE MESYLATE 20 MG IM SOLR
20.0000 mg | Freq: Once | INTRAMUSCULAR | Status: AC
  Administered 2024-01-01: 20 mg via INTRAMUSCULAR
  Filled 2024-01-01: qty 20

## 2024-01-01 NOTE — ED Notes (Signed)
 Physical therapy in with pt at this time. Pt is getting a bath.

## 2024-01-01 NOTE — ED Notes (Signed)
vol/toc placement.. 

## 2024-01-01 NOTE — ED Notes (Signed)
 Patient attempting to get out of bed.  Redirected back in bed.  Comfort measures addressed.  Bed alarm restarted.  Patient currently wearing yellow socks

## 2024-01-01 NOTE — ED Notes (Signed)
 Pt given breakfast at this time. Pt is in good mood and communicating well with staff.

## 2024-01-01 NOTE — ED Provider Notes (Signed)
-----------------------------------------   7:22 AM on 01/01/2024 -----------------------------------------   Blood pressure 125/73, pulse 68, temperature 98.2 F (36.8 C), temperature source Oral, resp. rate 18, height 5\' 5"  (1.651 m), weight 77 kg, SpO2 (!) 65%.  The patient is calm and cooperative at this time.  There have been no acute events since the last update.  Awaiting disposition plan from case management/social work.    Penny Hart, Penny Dao, DO 01/01/24 [phone]

## 2024-01-01 NOTE — ED Notes (Signed)
 Pt calm and cooperative at this time. Pt given apple juice per her request.

## 2024-01-01 NOTE — Progress Notes (Signed)
 Occupational Therapy Treatment Patient Details Name: SAMUEL RITTENHOUSE MRN: [id number] DOB: [birth year] Today's Date: 01/01/2024   History of present illness Pt is a 78 y.o. female presents to the ED via EMS with son called stating that pt was agitated. EMS reports that police were at her home and had them transport her to the ED. Per MD note pt admitted to the ED with plan for psychiatric and TTS evaluation. 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   OT comments  Pt pleasant and agreeable to OT intervention session facilitating toileting needs, UB/LB dressing and bathing. Pt continues to require hand-over-hand placement for functional transfers to increase safe hand placement on RW once standing, max multimodal cuing for environmental navigation due to vision impairments, and benefits from step-by-step cuing for sequencing during UB/LB dressing. Bed mobility performed at supervision level, CGA for mobility t/f commode in room, and MIN A for ADL tasks. Pt left seated in recliner, needs in reach. NSG updated on pt's position, pt making progress towards goals, OT will continue to follow. Pt will require 24/7 supervision at discharge due to functional impairments.       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, sees shadows  in L eye) Restrictions Weight Bearing Restrictions Per Provider Order: No       Mobility Bed Mobility Overal bed mobility: Needs Assistance       Supine to sit: Supervision     General bed mobility comments: cues for bed mobility no physical assist    Transfers Overall transfer level: Needs assistance Equipment used: Rolling walker (2 wheels) Transfers: Sit to/from Stand Sit to Stand: Supervision, Contact guard assist           General transfer comment: often requires hand over hand assist for hand placement on bed or rw     Balance Overall balance assessment: Needs assistance Sitting-balance support: Feet supported Sitting balance-Leahy Scale: Good     Standing balance support: During functional activity, Reliant on assistive device for balance Standing balance-Leahy Scale: Fair Standing balance comment: able to sustain balance without UE support on RW, high falls risk due to cognition and vision deficits                           ADL either performed or assessed with clinical judgement   ADL Overall ADL's : Needs assistance/impaired     Grooming: Wash/dry hands;Sitting;Applying deodorant;Cueing for safety;Cueing for sequencing   Upper Body Bathing: Sitting;Cueing for sequencing;Contact guard assist   Lower Body Bathing: Sitting/lateral leans;Minimal assistance;Cueing for safety;Cueing for sequencing Lower Body Bathing Details (indicate cue type and reason): standard commode after toileting Upper Body Dressing : Minimal assistance;Sitting;Cueing for sequencing Upper Body Dressing Details (indicate cue type and reason): MIN A to orient item due to vision Lower Body Dressing: Minimal assistance;Cueing for sequencing;Cueing for safety;Sit to/from stand Lower Body Dressing Details (indicate cue type and reason): requires cues for sequencing, pt perseverating on completing bathing. stands and tries to walk forward without  donning pants over hips, cues to  redirect Toilet Transfer: Ambulation;Rolling walker (2 wheels);Cueing for sequencing;Regular Toilet;Cueing for safety;Minimal assistance   Toileting- Clothing Manipulation and Hygiene: Sitting/lateral lean;Sit to/from stand;Minimal assistance Toileting - Clothing Manipulation Details (indicate cue type and reason): OT hands paper to pt due to vision deficits     Functional mobility during ADLs: Rolling walker (2 wheels);Cueing for safety;Cueing for sequencing;Contact guard assist General ADL Comments: pt pleasant and cooperative. likely close to baseline for ADL performacne     Vision Ability to See in Adequate Light: 3 Highly impaired           Communication Communication Communication: No apparent difficulties   Cognition Arousal: Alert Behavior During Therapy: WFL for tasks assessed/performed Cognition: Cognition impaired, History of cognitive impairments, No family/caregiver present to determine baseline   Orientation impairments: Place, Time, Situation   Memory impairment (select all impairments): Short-term memory, Working [employer] fast streamer, Non-declarative long-term memory, Geneticist, molecular long-term memory Attention impairment (select first level of impairment): Focused attention Executive functioning impairment (select all impairments): Problem solving, Sequencing, Initiation, Reasoning, Organization OT - Cognition Comments: mildly impulsive, does not want to wait for direction, poor safety awareness due to vision deficits                 Following commands: Impaired Following commands impaired: Only follows one step commands consistently      Cueing   Cueing Techniques: Verbal cues, Tactile cues             Pertinent Vitals/ Pain       Pain Assessment Pain Assessment: Faces Faces Pain Scale: Hurts a little bit Pain Location: R knee Pain Descriptors / Indicators: Aching Pain Intervention(s): Limited activity within patient's tolerance, Monitored during  session   Frequency  Min 1X/week        Progress Toward Goals  OT Goals(current goals can now be found in the care plan section)  Progress towards OT goals: Progressing toward goals  Acute Rehab OT Goals OT Goal Formulation: Patient unable to participate in goal setting Time For Goal Achievement: 01/11/24 Potential to Achieve Goals: Fair  Plan         AM-PAC OT "6 Clicks" Daily Activity     Outcome Measure   Help from another person eating meals?: A Little Help from another person taking care of personal grooming?: A Little Help from another person toileting, which includes using toliet, bedpan, or urinal?: A Little Help from another person bathing (including washing, rinsing, drying)?: A Little Help from another person to put on and taking off regular upper body clothing?: A Little Help from another person to put on and taking off regular lower body clothing?: A Little 6 Click Score: 18    End of Session Equipment Utilized During Treatment: Rolling walker (2 wheels)  OT Visit Diagnosis: Other abnormalities of gait and mobility (R26.89);Muscle weakness (generalized) (M62.81)   Activity Tolerance Patient tolerated treatment well   Patient Left in chair;with call bell/phone within reach;with chair alarm set   Nurse Communication Mobility status        Time: 1610-9604 OT Time Calculation (min): 25 min  Charges: OT General Charges $OT Visit: 1 Visit OT Treatments $Self Care/Home Management : 23-37 mins  Tristyn Demarest L. Jaser Fullen, OTR/L  01/01/24, 12:52 PM

## 2024-01-01 NOTE — ED Notes (Signed)
 Pt is calm at this time and sitting up in bed. Pt denies any needs and reports she don't "want or need nothing" from us .

## 2024-01-01 NOTE — TOC Progression Note (Signed)
 Transition of Care [facility]) - Progression Note    Patient Details  Name: Penny Hart MRN: [id number] Date of Birth: 12/06/1945  [facility] of Care [facility]) CM/SW Contact  Joslyn Nim, RN Phone Number: 01/01/2024, 10:26 AM  Clinical Narrative:    Cm spoke with daughter Laury Portela and granddaughter Ms. Biglow [phone]. Cm asked if patient still have her apartment. They states no.  They cleaned out her apartment and found recent bank statement.. They were able to tell CM that patient's SS is $1694 and pension $95.77 per month. Life insurance policy on one of her son's is $63.28  per month with a face value of $5000 which patient is the beneficiary.  They state that patient does not have a POA. Patient has been diagnosis with dementia about 5 years . In the beginning they tried to get patient to do POA but family states that patient said she does not want or need anyone over her.  CM asked if the DSS worker. Ms. Earmon Glow has started the process of long term medicaid. Family stated no. CM asked what information did DSS worker tell them. They states Ms. Earmon Glow states that she will get a letter first then start the long term application process.  Granddaughter states that patient dementia MD, Dr. Aline Ireland K. Mason Sole has written a letter in January of this year about patient's son is the decision maker but his name is not listed. CM informed them that MD has to list a person's name.  One son has been incarcerated for over 30 years and they other son who was living with patient did not pay her bills. They will reach out to Dr. Mason Sole to get a new letter  stating  patient's daughter, Laury Portela will be the decision maker. When they received the letter they will email it to CM  CM informed them that patient is not admitted to the hospital. CM confirmed that patient has no home and they can not care for her. They would like the patient placed long term. CM explained that insurance denied SNF. They state that  another granddaughter works with dementia patients but she is having issues that she can not assist with patient.  CM called DSS worker Ms. McNeil but had to leave a message.  CM  reached out to Vonita Guan, Administrator, sports at [facility]. She asked if family provide letter from her MD about the dementia dx can she start long term care medicaid application. She asked CM to forward the letter when family send it     Expected Discharge Plan: Skilled Nursing Facility Barriers to Discharge: Continued Medical Work up  Expected Discharge Plan and Services   Discharge Planning Services: CM Consult   Living arrangements for the past 2 months: Apartment                                       Social Determinants of Health (SDOH) Interventions SDOH Screenings   Housing: Unknown (08/01/2023)   Received from [facility]  Transportation Needs: No Transportation Needs (03/10/2023)  Tobacco Use: Medium Risk (12/11/2023)    Readmission Risk Interventions     No data to display

## 2024-01-01 NOTE — Progress Notes (Signed)
 Physical Therapy Treatment Patient Details Name: Penny Hart MRN: [id number] DOB: July 19, 1946 Today's Date: 01/01/2024   History of Present Illness Pt is a 78 y.o. female presents to the ED via EMS with son called stating that pt was agitated. EMS reports that police were at her home and had them transport her to the ED. Per MD note pt admitted to the ED with plan for psychiatric and TTS evaluation. 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    PT Comments  Pt tolerated treatment well today and was able to improve overall ambulation distance and assist levels from previous sessions. She continues to require supervision-CGA in standing secondary to visual deficits which increase her risk of falls. States that L eye is blurry, so she will likely benefit from increased lighting during sessions. Increased assist required for RW management, but pt able to follow 2-step commands with increased time for processing. Increased c/o dull/achey R knee pain after ambulation which she says was chronic. Pt will continue to benefit from skilled acute PT services to address deficits for return to baseline function.     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 CGA for anterior scooting towards EOB, use of BUE for support, increased time/effort.    Transfers       Sit to Stand: Supervision, Contact guard assist           General transfer comment: Supervision for safety to stand from EOB with RW; CGA for safety due to vision deficits to sit in recliner with increased assist for RW management and hand over hand cues for hand placement. Demonstrates "fair" eccentric lowering with proper hand placement.    Ambulation/Gait Ambulation/Gait assistance: Contact guard assist, Min assist Gait Distance (Feet): 100 Feet           General Gait Details: Overall CGA for balance, but requires min assist for RW navigation secondary to visual deficits. Demonstrates RLE in ER position, early reciprocal gait, forward flexed posture, and slowed cadence.      Balance Overall balance assessment: Needs assistance Sitting-balance support: Feet supported Sitting balance-Leahy Scale: Good     Standing balance support: During functional activity, Reliant on assistive device for balance Standing balance-Leahy Scale: Fair Standing balance comment: pt remains at risk of falls due to vision deficits, follows commands with increased time. CGA for safety and increased assist for RW management.                            Communication Communication Communication: No apparent difficulties  Cognition Arousal: Alert Behavior During Therapy: WFL for tasks assessed/performed   PT -  Cognitive impairments: History of cognitive impairments                       PT - Cognition Comments: Pt is A and calm this session. does have poor overall awareness of situation. Is able to consistently follow commands. vision deficits greatly impacts pt's progress Following commands:  Impaired Following commands impaired: Only follows one step commands consistently    Cueing Cueing Techniques: Verbal cues, Tactile cues  Exercises Other Exercises Other Exercises: Pt educated re: PT role/POC, DC recommendations, call for help, OOB to chair, safety with RW. She verbalized understanding.    General Comments        Pertinent Vitals/Pain Pain Assessment Pain Assessment: 0-10 Pain Score: 6  Pain Location: R knee after mobility Pain Descriptors / Indicators: Aching Pain Intervention(s): Limited activity within patient's tolerance, Monitored during session, Repositioned     PT Goals (current goals can now be found in the care plan section) Acute Rehab PT Goals Patient Stated Goal: " I want my sorry daughter to come get me." Progress towards PT goals: Progressing toward goals    Frequency    Min 1X/week       AM-PAC PT "6 Clicks" Mobility   Outcome Measure  Help needed turning from your back to your side while in a flat bed without using bedrails?: None Help needed moving from lying on your back to sitting on the side of a flat bed without using bedrails?: A Little Help needed moving to and from a bed to a chair (including a wheelchair)?: A Little Help needed standing up from a chair using your arms (e.g., wheelchair or bedside chair)?: A Little Help needed to walk in hospital room?: A Little Help needed climbing 3-5 steps with a railing? : A Little 6 Click Score: 19    End of Session Equipment Utilized During Treatment: Gait belt Activity Tolerance: Patient tolerated treatment well Patient left: with call bell/phone within reach;in chair;with chair alarm set Nurse Communication: Mobility status PT Visit Diagnosis: Muscle weakness (generalized) (M62.81);Difficulty in walking, not elsewhere classified (R26.2);Unsteadiness on feet (R26.81)     Time: 4098-1191 PT Time Calculation (min) (ACUTE ONLY): 16 min  Charges:    $Therapeutic Activity: 8-22  mins PT General Charges $$ ACUTE PT VISIT: 1 Visit                       Branda Cain, PT, DPT 11:54 AM,01/01/24 Physical Therapist - [facility]

## 2024-01-01 NOTE — ED Notes (Signed)
 This tech took this pt to the toilet in this room

## 2024-01-01 NOTE — ED Notes (Signed)
 Pt back in bed from the recliner. Pt is being calm and cooperative at this time and in no distress.

## 2024-01-01 NOTE — ED Notes (Signed)
 Pt out of bed again, agitated, combative, and verbally abusive towards staff.

## 2024-01-01 NOTE — ED Notes (Signed)
 Pt getting out of bed. Pt redirected back to bed, covered up with blankets, and call bell within reach. Pt encouraged to get some sleep.

## 2024-01-01 NOTE — ED Notes (Signed)
Patient remains asleep at this time.  Respirations even and unlabored.   ?

## 2024-01-01 NOTE — ED Notes (Signed)
 Pt combative, restless, and agitated at this time. Pt refusing sit down or be assisted walking. Pt walking around halls and cursing staff out.

## 2024-01-01 NOTE — ED Notes (Signed)
 Patient asleep at this time.  Will hold medications until patient is awake.

## 2024-01-01 NOTE — ED Notes (Signed)
 Pt sat in hallway in recliner chair.

## 2024-01-01 NOTE — ED Notes (Addendum)
 Pt beginning to get agitated, restless, and combative. Pt yelling at staff to take her home as well as to find her pocket book. Pt refusing to sit in chair or bed, and insist on "going home".

## 2024-01-02 NOTE — ED Provider Notes (Signed)
-----------------------------------------   5:24 AM on 01/02/2024 -----------------------------------------   Blood pressure 109/69, pulse 72, temperature 98.2 F (36.8 C), temperature source Oral, resp. rate 18, height 5\' 5"  (1.651 m), weight 77 kg, SpO2 96%.  The patient is calm and cooperative at this time.  There have been no acute events since the last update.  Awaiting disposition plan from Social Work team.   Kamaiyah Uselton J, MD 01/02/24 [phone]

## 2024-01-02 NOTE — ED Notes (Signed)
 Pts daughter at bedside

## 2024-01-02 NOTE — TOC Progression Note (Signed)
 Transition of Care [facility]) - Progression Note    Patient Details  Name: Penny Hart MRN: [id number] Date of Birth: 08/13/1945  [facility]) CM/SW Contact  Joslyn Nim, RN Phone Number: 01/02/2024, 10:25 AM  Clinical Narrative:     CM received letter form family from patient's MD. CM forward the letter to financial counselor, Vonita Guan , along with family contact information to start Medicaid application.    Expected Discharge Plan: Skilled Nursing Facility Barriers to Discharge: Continued Medical Work up  Expected Discharge Plan and Services   Discharge Planning Services: CM Consult   Living arrangements for the past 2 months: Apartment                                       Social Determinants of Health (SDOH) Interventions SDOH Screenings   Housing: Unknown (08/01/2023)   Received from [facility]  Transportation Needs: No Transportation Needs (03/10/2023)  Tobacco Use: Medium Risk (12/11/2023)    Readmission Risk Interventions     No data to display

## 2024-01-02 NOTE — ED Notes (Addendum)
 Rn at bedside due to bed alarm sounding. Pt out of bed and attempting to exit room. This RN and EDT attempting to redirect pt with difficulty. Staff eventually able to redirect pt back into room and back into bed. Pt provided warm blankets. Fall risk bundle in place. Light dimmed for comfort.

## 2024-01-02 NOTE — ED Notes (Signed)
Fall bundle in place

## 2024-01-02 NOTE — ED Notes (Signed)
 Pt awake and out of bed. Bed alarm sounding. Pt hard to redirect by this Water engineer. Pt assisted back into room by Ace Abu, RN. Vitals taken and pt agreeable to take medication.

## 2024-01-02 NOTE — ED Provider Notes (Signed)
 Patient is calm and cooperative.  No acute events.  Disposition pending TOC/case management.  BP 100/72 (BP Location: Left Arm)   Pulse 66   Temp 98.1 F (36.7 C) (Oral)   Resp 16   Ht 5\' 5"  (1.651 m)   Wt 77 kg   SpO2 95%   BMI 28.25 kg/m     Collis Deaner, MD 01/02/24 2053

## 2024-01-02 NOTE — ED Notes (Signed)
Mobility specialist at bedside

## 2024-01-03 MED ORDER — TUBERCULIN PPD 5 UNIT/0.1ML ID SOLN
5.0000 [IU] | INTRADERMAL | Status: DC
  Administered 2024-01-03: 5 [IU] via INTRADERMAL
  Filled 2024-01-03: qty 0.1

## 2024-01-03 NOTE — ED Notes (Signed)
 Pt repositioned in bed, and  given a breakfast tray

## 2024-01-03 NOTE — Progress Notes (Signed)
 Occupational Therapy Treatment Patient Details Name: Penny Hart MRN: [id number] DOB: 1946/05/04 Today's Date: 01/03/2024   History of present illness Pt is a 78 y.o. female presents to the ED via EMS with son called stating that pt was agitated. EMS reports that police were at her home and had them transport her to the ED. Per MD note pt admitted to the ED with plan for psychiatric and TTS evaluation. 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   OT comments  Pt seen for OT tx this date, pleasant and cooperative. Poor STM throughout and requires max multimodal cuing with hand over hand prompts for functional task performance due to vision deficits. Improvement in waiting for directions from OT this session. Bed mobility supervision, t/f bathroom for void (BSC over standard commode), and stands at sink for oral care + grooming. Often MIN A to correct posterior bias in standing. Pt returned to bed, alarm activated and needs in reach. OT will continue to follow, will require 24/7 supervision and assist at discharge due to vision and cognitive deficits.       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, sees shadows in L eye) Restrictions Weight Bearing Restrictions Per Provider Order: No       Mobility Bed  Mobility Overal bed mobility: Needs Assistance       Supine to sit: Supervision Sit to supine: Supervision   General bed mobility comments: cues for bed mobility no physical assist    Transfers Overall transfer level: Needs assistance Equipment used: Rolling walker (2 wheels) Transfers: Sit to/from Stand Sit to Stand: Supervision, Contact guard assist           General transfer comment: often requires hand over hand assist for hand placement on bed or rw     Balance Overall balance assessment: Needs assistance Sitting-balance support: Feet supported Sitting balance-Leahy Scale: Good Sitting balance - Comments: Steady reaching with BOS   Standing balance support: During functional activity, Reliant on assistive device for balance Standing balance-Leahy Scale: Fair Standing balance comment: minA standing at sink to correct posterior lean                           ADL either performed or assessed with clinical judgement   ADL Overall ADL's : Needs assistance/impaired     Grooming: Wash/dry hands;Sitting;Applying deodorant;Cueing for safety;Cueing for sequencing;Wash/dry face;Oral care;Standing Grooming Details (indicate cue type and reason): step by step cuing with hand over hand assist provided to locate task items. occassional posterior lean requiring minA to correct LOB.                 Toilet Transfer: Ambulation;Rolling walker (2 wheels);Cueing for sequencing;Regular Toilet;Cueing for safety;Minimal assistance Toilet Transfer Details (indicate cue type and reason): BSC over standard toilet, max cues and hand over hand to locate bars for STS on [facility] Toileting- Clothing Manipulation and Hygiene: Sitting/lateral lean;Sit to/from stand;Minimal assistance Toileting - Clothing Manipulation Details (indicate cue type and reason): OT hands  paper to pt due to vision deficits     Functional mobility during ADLs: Rolling walker (2 wheels);Cueing for  safety;Cueing for sequencing;Contact guard assist General ADL Comments: pt pleasant and cooperative. likely close to baseline for ADL performance. t/f bathroom in ED room with RW     Communication Communication Communication: No apparent difficulties   Cognition Arousal: Alert Behavior During Therapy: WFL for tasks assessed/performed Cognition: Cognition impaired, History of cognitive impairments, No family/caregiver present to determine baseline   Orientation impairments: Place, Time, Situation   Memory impairment (select all impairments): Short-term memory, Working [employer] fast streamer, Non-declarative long-term memory, Geneticist, molecular long-term memory Attention impairment (select first level of impairment): Focused attention Executive functioning impairment (select all impairments): Problem solving, Sequencing, Initiation, Reasoning, Organization OT - Cognition Comments: mildly impulsive, does not want to wait for direction, poor safety awareness due to vision deficits                 Following commands: Impaired Following commands impaired: Only follows one step commands consistently      Cueing   Cueing Techniques: Verbal cues, Tactile cues             Pertinent Vitals/ Pain       Pain Assessment Pain Assessment: No/denies pain   Frequency  Min 1X/week        Progress Toward Goals  OT Goals(current goals can now be found in the care plan section)  Progress towards OT goals: Progressing toward goals  Acute Rehab OT Goals OT Goal Formulation: Patient unable to participate in goal setting Time For Goal Achievement: 01/11/24 Potential to Achieve Goals: Fair  Plan      AM-PAC OT 6 Clicks Daily Activity     Outcome Measure   Help from another person eating meals?: A Little Help from another person taking care of personal grooming?: A Little Help from another person toileting, which includes using toliet, bedpan, or urinal?: A Little Help from another person bathing  (including washing, rinsing, drying)?: A Little Help from another person to put on and taking off regular upper body clothing?: A Little   6 Click Score: 15    End of Session Equipment Utilized During Treatment: Rolling walker (2 wheels)  OT Visit Diagnosis: Other abnormalities of gait and mobility (R26.89);Muscle weakness (generalized) (M62.81)   Activity Tolerance Patient tolerated treatment well   Patient Left in bed;with call bell/phone within reach;with bed alarm set   Nurse Communication Mobility status        Time: 9147-8295 OT Time Calculation (min): 17 min  Charges: OT General Charges $OT Visit: 1 Visit OT Treatments $Self Care/Home Management : 8-22 mins  Deontay Ladnier L. Jsoeph Podesta, OTR/L  01/03/24, 1:12 PM

## 2024-01-03 NOTE — TOC Progression Note (Signed)
 Transition of Care [facility]) - Progression Note    Patient Details  Name: ANNALYNN CENTANNI MRN: [id number] Date of Birth: 1946-06-17  Transition of [facility]) CM/SW Contact  Joslyn Nim, RN Phone Number: 01/03/2024, 2:40 PM  Clinical Narrative:    CM and manager Lajoyce Pikes meet with DSS manager Fernanda Howell and Smiley Dung. Ms Maryrose Soja requested updated FL with current behavior and MD to order TB skin test.  CM reached out to the nurse about getting provider to order TB test.   CM emailed FL2 and information that TB test was placed today.    Expected Discharge Plan: Skilled Nursing Facility Barriers to Discharge: Continued Medical Work up  Expected Discharge Plan and Services   Discharge Planning Services: CM Consult   Living arrangements for the past 2 months: Apartment                                       Social Determinants of Health (SDOH) Interventions SDOH Screenings   Housing: Unknown (08/01/2023)   Received [facility]  Transportation Needs: No Transportation Needs (03/10/2023)  Tobacco Use: Medium Risk (12/11/2023)    Readmission Risk Interventions     No data to display

## 2024-01-03 NOTE — Progress Notes (Signed)
 Physical Therapy Treatment Patient Details Name: Penny Hart MRN: [id number] DOB: Dec 02, 1945 Today's Date: 01/03/2024   History of Present Illness Pt is a 78 y.o. female presents to the ED via EMS with son called stating that pt was agitated. EMS reports that police were at her home and had them transport her to the ED. Per MD note pt admitted to the ED with plan for psychiatric and TTS evaluation. 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    PT Comments  Patient is calm and cooperative throughout session. Patient walked with hand held assistance and with rolling walker. Gait pattern is improved with use of rolling walker. Recommend to continue PT to maximize independence and decrease caregiver burden.    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 cues for technique    Transfers Overall transfer level: Needs assistance Equipment used: None Transfers: Sit to/from Stand Sit to Stand: Contact guard assist           General transfer comment: cues for safety     Ambulation/Gait Ambulation/Gait assistance: Contact guard assist, Min assist Gait Distance (Feet):  (70ft, 64ft) Assistive device: Rolling walker (2 wheels), 1 person hand held assist Gait Pattern/deviations: Step-through pattern, Decreased stance time - right, Knees buckling, Narrow base of support Gait velocity: decreased     General Gait Details: with hand held assistance only, patient has narrow base of support and is reaching out for furniture for support with the other hand. mild right knee buckling x 1 without rolling walker. with the rolling walker, gait pattern and balance is improved. navigational cues required with mobility due to vision deficits   Stairs             Wheelchair Mobility     Tilt Bed    Modified Rankin (Stroke Patients Only)       Balance Overall balance assessment: Needs assistance Sitting-balance support: Feet supported Sitting balance-Leahy Scale: Good     Standing balance support: During functional activity Standing balance-Leahy Scale: Fair Standing balance comment: CGA for standing at sink to wash hands                            Communication Communication Communication: No apparent difficulties  Cognition Arousal: Alert Behavior During Therapy: WFL for tasks assessed/performed   PT - Cognitive impairments: History of cognitive impairments                       PT - Cognition Comments: patient is calm and cooperative throughout session Following commands: Impaired Following commands impaired: Only follows one step commands consistently    Cueing Cueing Techniques: Verbal cues,  Tactile cues  Exercises      General Comments General comments (skin integrity, edema, etc.): care plan extended x 2 weeks.      Pertinent Vitals/Pain Pain Assessment Pain Assessment: Faces Faces Pain Scale: Hurts a little bit Pain Location: sacral area Pain Descriptors / Indicators: Discomfort Pain Intervention(s):  Repositioned    Home Living                          Prior Function            PT Goals (current goals can now be found in the care plan section) Acute Rehab PT Goals Patient Stated Goal: to get better PT Goal Formulation: Patient unable to participate in goal setting Time For Goal Achievement: 01/17/24 Potential to Achieve Goals: Fair Progress towards PT goals: Progressing toward goals    Frequency    Min 1X/week      PT Plan      Co-evaluation              AM-PAC PT 6 Clicks Mobility   Outcome Measure  Help needed turning from your back to your side while in a flat bed without using bedrails?: None Help needed moving from lying on your back to sitting on the side of a flat bed without using bedrails?: A Little Help needed moving to and from a bed to a chair (including a wheelchair)?: A Little Help needed standing up from a chair using your arms (e.g., wheelchair or bedside chair)?: A Little Help needed to walk in hospital room?: A Little Help needed climbing 3-5 steps with a railing? : A Little 6 Click Score: 19    End of Session   Activity Tolerance: Patient tolerated treatment well Patient left: in bed;with call bell/phone within reach;with bed alarm set Nurse Communication: Mobility status PT Visit Diagnosis: Muscle weakness (generalized) (M62.81);Difficulty in walking, not elsewhere classified (R26.2);Unsteadiness on feet (R26.81)     Time: 4540-9811 PT Time Calculation (min) (ACUTE ONLY): 14 min  Charges:    $Therapeutic Activity: 8-22 mins PT General Charges $$ ACUTE PT VISIT: 1 Visit                     Ozie Bo, PT, MPT    Erlene Hawks 01/03/2024, 2:01 PM

## 2024-01-03 NOTE — ED Notes (Signed)
 Pt given fresh ice water and ice cream for a snack.

## 2024-01-03 NOTE — ED Provider Notes (Signed)
 BP 107/67   Pulse 60   Temp 98.1 F (36.7 C)   Resp 16   Ht 5' 5 (1.651 m)   Wt 77 kg   SpO2 95%   BMI 28.25 kg/m   Resting comfortably in bed.  No acute events since last update.  Awaiting disposition from social work 2.   Collis Deaner, MD 01/03/24 1550

## 2024-01-03 NOTE — ED Provider Notes (Signed)
-----------------------------------------   6:11 AM on 01/03/2024 -----------------------------------------   Blood pressure 100/72, pulse 66, temperature 98.1 F (36.7 C), temperature source Oral, resp. rate 16, height 5' 5 (1.651 m), weight 77 kg, SpO2 95%.  The patient is calm and cooperative at this time.  There have been no acute events since the last update.  Awaiting disposition plan from Social Work team.   Olimpia Tinch J, MD 01/03/24 [phone]

## 2024-01-03 NOTE — ED Notes (Signed)
 OT at bedside working with pt.

## 2024-01-04 MED ORDER — TORSEMIDE 20 MG PO TABS
20.0000 mg | ORAL_TABLET | Freq: Every day | ORAL | 2 refills | Status: AC
Start: 2024-01-04 — End: 2024-04-03

## 2024-01-04 MED ORDER — DORZOLAMIDE HCL-TIMOLOL MAL 2-0.5 % OP SOLN: 1.0000 [drp] | Freq: Two times a day (BID) | OPHTHALMIC | 11 refills | Status: DC

## 2024-01-04 MED ORDER — ASPIRIN EC 81 MG PO TBEC
81.0000 mg | DELAYED_RELEASE_TABLET | Freq: Every day | ORAL | 2 refills | Status: AC
Start: 2024-01-04 — End: 2024-04-03

## 2024-01-04 MED ORDER — QUETIAPINE FUMARATE 50 MG PO TABS: 50.0000 mg | ORAL_TABLET | Freq: Every day | ORAL | 2 refills | Status: DC

## 2024-01-04 MED ORDER — LATANOPROST 0.005 % OP SOLN: 1.0000 [drp] | Freq: Every day | OPHTHALMIC | 11 refills | Status: AC

## 2024-01-04 MED ORDER — QUETIAPINE FUMARATE 25 MG PO TABS
25.0000 mg | ORAL_TABLET | Freq: Every morning | ORAL | 2 refills | Status: AC
Start: 2024-01-04 — End: 2024-04-03

## 2024-01-04 MED ORDER — ISOSORBIDE MONONITRATE ER 30 MG PO TB24: 30.0000 mg | ORAL_TABLET | Freq: Every day | ORAL | 2 refills | Status: AC

## 2024-01-04 MED ORDER — PRAVASTATIN SODIUM 10 MG PO TABS: 10.0000 mg | ORAL_TABLET | Freq: Every evening | ORAL | 2 refills | Status: AC

## 2024-01-04 MED ORDER — MIRTAZAPINE 15 MG PO TABS: 15.0000 mg | ORAL_TABLET | Freq: Every day | ORAL | 0 refills | Status: AC

## 2024-01-04 MED ORDER — POTASSIUM CHLORIDE CRYS ER 10 MEQ PO TBCR: 10.0000 meq | EXTENDED_RELEASE_TABLET | Freq: Two times a day (BID) | ORAL | 2 refills | Status: DC

## 2024-01-04 MED ORDER — LAMOTRIGINE 25 MG PO TABS
50.0000 mg | ORAL_TABLET | Freq: Two times a day (BID) | ORAL | 2 refills | Status: AC
Start: 2024-01-04 — End: 2024-04-03

## 2024-01-04 MED ORDER — METOPROLOL SUCCINATE ER 25 MG PO TB24: 25.0000 mg | ORAL_TABLET | Freq: Every day | ORAL | 2 refills | Status: AC

## 2024-01-04 NOTE — ED Notes (Signed)
 Pt up to toilet to void and back in bed with no other needs at this time. Pt was given fresh ice water with morning medication.

## 2024-01-04 NOTE — ED Provider Notes (Signed)
-----------------------------------------   9:14 AM on 01/04/2024 -----------------------------------------   Blood pressure 127/78, pulse 60, temperature 98.4 F (36.9 C), temperature source Oral, resp. rate 18, height 5' 5 (1.651 m), weight 77 kg, SpO2 95%.  The patient is calm and cooperative at this time.  Care management team saw patient again yesterday.  DSS will be picking patient up today with discharge to skilled nursing facility.  Will send prescriptions to [facility] pharmacy in [place].  Will be discharged.   Kandee Orion, MD 01/04/24 [phone]

## 2024-01-04 NOTE — ED Notes (Signed)
 Pt discharged from ED at this time. Pt discharged with [facility] from DSS. DSS now has guardianship of pt per [facility]. Pt belongings sent with pt.

## 2024-01-04 NOTE — ED Notes (Signed)
 PT given apple sauce and graham crackers.

## 2024-01-04 NOTE — Discharge Instructions (Signed)
 Please follow up with your primary care provider as needed. Medications have been sent to the [facility] in [place].

## 2024-01-04 NOTE — ED Provider Notes (Signed)
-----------------------------------------   3:58 AM on 01/04/2024 -----------------------------------------   Blood pressure 127/78, pulse 60, temperature 98.4 F (36.9 C), temperature source Oral, resp. rate 18, height 5' 5 (1.651 m), weight 77 kg, SpO2 95%.  The patient is calm and cooperative at this time.  There have been no acute events since the last update.  Awaiting disposition plan from Social Work team.   Anayelli Lai J, MD 01/04/24 [phone]

## 2024-01-04 NOTE — TOC Progression Note (Signed)
 Transition of Care [facility]) - Progression Note    Patient Details  Name: Penny Hart MRN: [id number] Date of Birth: 1946-04-23  [facility] CM/SW Contact  Joslyn Nim, RN Phone Number: 01/04/2024, 9:18 AM  Clinical Narrative:    CM received an email this morning from Kaliee Morrow, APS Supervisor. She states that her staff [name] Allen-Bird and Gareth Junes will be picking up patient today around noon.  Patient's prescriptions need to go to [facility] in [place].  Victorino Grates states that they will inform family of patient's d/c today.  Cm notified  ED provider and nurse about pick up time and where patient's medications need to be sent.    Expected Discharge Plan: Skilled Nursing Facility Barriers to Discharge: Continued Medical Work up  Expected Discharge Plan and Services   Discharge Planning Services: CM Consult   Living arrangements for the past 2 months: Apartment                                       Social Determinants of Health (SDOH) Interventions SDOH Screenings   Housing: Unknown (08/01/2023)   Received from [facility]  Transportation Needs: No Transportation Needs (03/10/2023)  Tobacco Use: Medium Risk (12/11/2023)    Readmission Risk Interventions     No data to display

## 2024-01-04 NOTE — ED Notes (Signed)
 Pt given breakfast at this time.

## 2024-01-08 ENCOUNTER — Other Ambulatory Visit: Payer: Self-pay

## 2024-01-08 ENCOUNTER — Emergency Department
Admission: EM | Admit: 2024-01-08 | Discharge: 2024-01-08 | Disposition: A | Discharge status: Skilled Nursing Facility | Attending: Emergency Medicine | Admitting: Emergency Medicine

## 2024-01-08 DIAGNOSIS — J449 Chronic obstructive pulmonary disease, unspecified: Secondary | ICD-10-CM | POA: Insufficient documentation

## 2024-01-08 DIAGNOSIS — E119 Type 2 diabetes mellitus without complications: Secondary | ICD-10-CM | POA: Insufficient documentation

## 2024-01-08 DIAGNOSIS — I2510 Atherosclerotic heart disease of native coronary artery without angina pectoris: Secondary | ICD-10-CM | POA: Diagnosis not present

## 2024-01-08 DIAGNOSIS — F03911 Unspecified dementia, unspecified severity, with agitation: Secondary | ICD-10-CM | POA: Insufficient documentation

## 2024-01-08 DIAGNOSIS — R456 Violent behavior: Secondary | ICD-10-CM | POA: Insufficient documentation

## 2024-01-08 DIAGNOSIS — I10 Essential (primary) hypertension: Secondary | ICD-10-CM | POA: Diagnosis not present

## 2024-01-08 DIAGNOSIS — R451 Restlessness and agitation: Secondary | ICD-10-CM

## 2024-01-08 LAB — BASIC METABOLIC PANEL WITH GFR
Anion gap: 8 (ref 5–15)
BUN: 10 mg/dL (ref 8–23)
CO2: 26 mmol/L (ref 22–32)
Calcium: 9.6 mg/dL (ref 8.9–10.3)
Chloride: 107 mmol/L (ref 98–111)
Creatinine, Ser: 0.91 mg/dL (ref 0.44–1.00)
GFR, Estimated: >60 mL/min (ref >60)
Glucose, Bld: 106 mg/dL — ABNORMAL HIGH (ref 70–99)
Potassium: 3.5 mmol/L (ref 3.5–5.1)
Sodium: 141 mmol/L (ref 135–145)

## 2024-01-08 LAB — CBC WITH DIFFERENTIAL/PLATELET
Abs Immature Granulocytes: 0.01 10*3/uL (ref 0.00–0.07)
Basophils Absolute: 0.1 10*3/uL (ref 0.0–0.1)
Basophils Relative: 1 %
Eosinophils Absolute: 0.2 10*3/uL (ref 0.0–0.5)
Eosinophils Relative: 3 %
HCT: 39.9 % (ref 36.0–46.0)
Hemoglobin: 12.7 g/dL (ref 12.0–15.0)
Immature Granulocytes: 0 %
Lymphocytes Relative: 31 %
Lymphs Abs: 1.8 10*3/uL (ref 0.7–4.0)
MCH: 28.3 pg (ref 26.0–34.0)
MCHC: 31.8 g/dL (ref 30.0–36.0)
MCV: 89.1 fL (ref 80.0–100.0)
Monocytes Absolute: 0.9 10*3/uL (ref 0.1–1.0)
Monocytes Relative: 15 %
Neutro Abs: 2.9 10*3/uL (ref 1.7–7.7)
Neutrophils Relative %: 50 %
Platelets: 271 10*3/uL (ref 150–400)
RBC: 4.48 MIL/uL (ref 3.87–5.11)
RDW: 14.2 % (ref 11.5–15.5)
WBC: 5.7 10*3/uL (ref 4.0–10.5)
nRBC: 0 % (ref 0.0–0.2)

## 2024-01-08 LAB — URINALYSIS, ROUTINE W REFLEX MICROSCOPIC
Bilirubin Urine: NEGATIVE
Glucose, UA: NEGATIVE mg/dL
Hgb urine dipstick: NEGATIVE
Ketones, ur: NEGATIVE mg/dL
Nitrite: NEGATIVE
Protein, ur: NEGATIVE mg/dL
Specific Gravity, Urine: 1.023 (ref 1.005–1.030)
pH: 5 (ref 5.0–8.0)

## 2024-01-08 MED ORDER — QUETIAPINE FUMARATE 25 MG PO TABS: 25.0000 mg | ORAL_TABLET | Freq: Every day | ORAL | 1 refills | Status: AC

## 2024-01-08 MED ORDER — QUETIAPINE FUMARATE 50 MG PO TABS: 50.0000 mg | ORAL_TABLET | Freq: Two times a day (BID) | ORAL | 1 refills | Status: DC

## 2024-01-08 MED ORDER — QUETIAPINE FUMARATE 50 MG PO TABS: 50.0000 mg | ORAL_TABLET | Freq: Two times a day (BID) | ORAL | 1 refills | Status: AC

## 2024-01-08 MED ORDER — QUETIAPINE FUMARATE 25 MG PO TABS: 25.0000 mg | ORAL_TABLET | Freq: Every day | ORAL | 1 refills | Status: DC

## 2024-01-08 NOTE — ED Notes (Signed)
 PT RESIDES AT [facility]

## 2024-01-08 NOTE — Consult Note (Signed)
 [facility] Consult Note  Patient Name: Penny Hart MRN: [id number] DOB: 1946/04/01 DATE OF Consult: 01/08/2024  PRIMARY PSYCHIATRIC DIAGNOSES  1.  Dementia with Behavioral Disturbance 2.   3.    RECOMMENDATIONS  Recommendations: Medication recommendations: Increase Seroquel  to 50 mg po BID and 25 mg po Q3 PM Non-Medication/therapeutic recommendations: Discharge back to memory care unit Is inpatient psychiatric hospitalization recommended for this patient? No (Explain why): No imminent danger to self or others Is another care setting recommended for this patient? (examples may include Crisis Stabilization Unit, Residential/Recovery Treatment, ALF/SNF, Memory Care Unit)  Yes (Explain why): Return to memory care unit From a psychiatric perspective, is this patient appropriate for discharge to an outpatient setting/resource or other less restrictive environment for continued care?  Yes (Explain why): No imminent danger to self or others Follow-Up Telepsychiatry C/L services: We will sign off for now. Please re-consult our service if needed for any concerning changes in the patient's condition, discharge planning, or questions. Communication: Treatment team members (and family members if applicable) who were involved in treatment/care discussions and planning, and with whom we spoke or engaged with via secure text/chat, include the following: Dr. Demetrios Finders; Carolynne Citron, RN; Jullie Oiler   Thank you for involving us  in the care of this patient. If you have any additional questions or concerns, please call [phone] and ask for me or the provider on-call.  TELEPSYCHIATRY ATTESTATION & CONSENT  As the provider for this telehealth consult, I attest that I verified the patient's identity using two separate identifiers, introduced myself to the patient, provided my credentials, disclosed my location, and performed this encounter via a HIPAA-compliant, real-time, face-to-face, two-way, interactive  audio and video platform and with the full consent and agreement of the patient (or guardian as applicable.)  Patient physical location: [facility]. Telehealth provider physical location: home office in state of [place] .  Video start time: 1249 (Central Time) Video end time: 1259 (Central Time)  IDENTIFYING DATA  Penny Hart is a 78 y.o. year-old female for whom a psychiatric consultation has been ordered by the primary provider. The patient was identified using two separate identifiers.  CHIEF COMPLAINT/REASON FOR CONSULT  Increased agitation and verbal threats  HISTORY OF PRESENT ILLNESS (HPI)  The patient presented to the ED with c/o increased agitation and verbal threats to memory care staff.  Pt reportedly transferred to a memory care unit on 01/04/24 after several weeks in the ED due to dementia and inability to care for self.  Per chart, pt was wandering into other resident's rooms at the new facility and threatening staff.     Upon evaluation, pt was able to indicate she was at the hospital but appears to believe she has been there the whole time and not transferred to another facility.  Presented as oriented x 2 and reported she became upset because people were saying that she was doing things when they were actually doing them. Reported people have been coming in and out of her room and she admitted to making threats to have them go to jail for this.    Per nurse, pt was in their ED since 12/10/23 until 01/04/24 when she was transferred to a memory care unit due to inability to continue to care for herself at home.  Reported facility staff reported agitation with verbal threats toward staff when redirected not to go into other resident's rooms.  Reported she is very familiar with pt due to her lengthy stay in the ED prior to  being transferred to the memory care unit.  Reported pt appears to be at baseline based on her presentation while in ED as pt can get somewhat agitated and  verbally aggressive but is easily redirected.  Reported facility concerned about possible medical cause but that has been ruled out.  Reported SW is working with facility to determine if they will be willing to take the pt back at this point.  Discussed making some medication adjustments at this time to help with pt's tendency to become easily agitated.    See above for medication recommendations.   PAST PSYCHIATRIC HISTORY   Otherwise as per HPI above.  PAST MEDICAL HISTORY  Past Medical History:  Diagnosis Date   Arthritis    CHF (congestive heart failure) (HCC)    COPD (chronic obstructive pulmonary disease) (HCC)    Coronary artery disease    Diabetes mellitus without complication (HCC)    GERD (gastroesophageal reflux disease)    Glaucoma    Headache    Hiatal hernia    Hyperlipidemia    Hypertension    Neuropathy    Obesity    Peripheral neuropathy    Pseudophakia    Sleep apnea    Spinal stenosis    Spondylolisthesis of lumbar region    Thoracic aortic atherosclerosis (HCC)      HOME MEDICATIONS  PTA Medications  Medication Sig   QUEtiapine  (SEROQUEL ) 25 MG tablet Take 1 tablet (25 mg total) by mouth in the morning.   QUEtiapine  (SEROQUEL ) 50 MG tablet Take 1 tablet (50 mg total) by mouth at bedtime.   dorzolamide -timolol  (COSOPT ) 2-0.5 % ophthalmic solution Place 1 drop into the left eye 2 (two) times daily.   latanoprost  (XALATAN ) 0.005 % ophthalmic solution Place 1 drop into both eyes at bedtime.   lamoTRIgine  (LAMICTAL ) 25 MG tablet Take 2 tablets (50 mg total) by mouth 2 (two) times daily.   mirtazapine  (REMERON ) 15 MG tablet Take 1 tablet (15 mg total) by mouth at bedtime.   potassium chloride  (KLOR-CON  M) 10 MEQ tablet Take 1 tablet (10 mEq total) by mouth 2 (two) times daily.   pravastatin  (PRAVACHOL ) 10 MG tablet Take 1 tablet (10 mg total) by mouth every evening.   torsemide  (DEMADEX ) 20 MG tablet Take 1 tablet (20 mg total) by mouth daily.   aspirin  EC 81 MG  tablet Take 1 tablet (81 mg total) by mouth daily. Swallow whole.   isosorbide  mononitrate (IMDUR ) 30 MG 24 hr tablet Take 1 tablet (30 mg total) by mouth daily.   metoprolol  succinate (TOPROL -XL) 25 MG 24 hr tablet Take 1 tablet (25 mg total) by mouth daily.     ALLERGIES  Allergies  Allergen Reactions   Oxycodone -Acetaminophen  Rash    UNSPECIFIED REACTION     Tramadol Rash and Other (See Comments)    INTOLERANCE > Chest pain Other reaction(s): Other (See Comments) Chest pain    SOCIAL & SUBSTANCE USE HISTORY  Social History   Socioeconomic History   Marital status: Widowed    Spouse name: Not on file   Number of children: Not on file   Years of education: Not on file   Highest education level: Not on file  Occupational History   Not on file  Tobacco Use   Smoking status: Former   Smokeless tobacco: Never  Vaping Use   Vaping status: Never Used  Substance and Sexual Activity   Alcohol use: Yes    Alcohol/week: 1.0 standard drink of alcohol  Types: 1 Glasses of wine per week   Drug use: No   Sexual activity: Not Currently  Other Topics Concern   Not on file  Social History Narrative   Not on file   Social Drivers of Health   Financial Resource Strain: Not on file  Food Insecurity: Not on file  Transportation Needs: No Transportation Needs (03/10/2023)   PRAPARE - Administrator, Civil Service (Medical): No    Lack of Transportation (Non-Medical): No  Physical Activity: Not on file  Stress: Not on file  Social Connections: Not on file   Social History   Tobacco Use  Smoking Status Former  Smokeless Tobacco Never   Social History   Substance and Sexual Activity  Alcohol Use Yes   Alcohol/week: 1.0 standard drink of alcohol   Types: 1 Glasses of wine per week   Social History   Substance and Sexual Activity  Drug Use No    Additional pertinent information recently moved to memory care unit.  FAMILY HISTORY  Family History  Problem  Relation Age of Onset   Diabetes Mother    Family Psychiatric History (if known):  Unknown  MENTAL STATUS EXAM (MSE)  Mental Status Exam: General Appearance: Casual  Orientation:  Other:  person, place  Memory:  Immediate;   Fair Recent;   Poor Remote;   Fair  Concentration:  Concentration: Good and Attention Span: Good  Recall:  Fair  Attention  Good  Eye Contact:  Good  Speech:  Clear and Coherent and Normal Rate  Language:  Good  Volume:  Normal  Mood: Dysthymic  Affect:  Constricted  Thought Process:  Goal Directed  Thought Content:  Logical  Suicidal Thoughts:  No  Homicidal Thoughts:  No  Judgement:  Impaired  Insight:  Lacking  Psychomotor Activity:  Normal  Akathisia:  No  Fund of Knowledge:  Good    Assets:  Communication Skills Housing Social Support  Cognition:  Impaired,  Moderate  ADL's:  Intact  AIMS (if indicated):       VITALS  Blood pressure (!) 94/59, pulse 63, temperature 98.2 F (36.8 C), temperature source Oral, resp. rate 18, SpO2 94%.  LABS  Admission on 01/08/2024  Component Date Value Ref Range Status   Sodium 01/08/2024 141  135 - 145 mmol/L Final   Potassium 01/08/2024 3.5  3.5 - 5.1 mmol/L Final   Chloride 01/08/2024 107  98 - 111 mmol/L Final   CO2 01/08/2024 26  22 - 32 mmol/L Final   Glucose, Bld 01/08/2024 106 (H)  70 - 99 mg/dL Final   Glucose reference range applies only to samples taken after fasting for at least 8 hours.   BUN 01/08/2024 10  8 - 23 mg/dL Final   Creatinine, Ser 01/08/2024 0.91  0.44 - 1.00 mg/dL Final   Calcium 95/28/4132 9.6  8.9 - 10.3 mg/dL Final   GFR, Estimated 01/08/2024 >60  >60 mL/min Final   Comment: (NOTE) Calculated using the CKD-EPI Creatinine Equation (2021)    Anion gap 01/08/2024 8  5 - 15 Final   Performed at [facility], [address] [place], [place] [address]   WBC 01/08/2024 5.7  4.0 - 10.5 K/uL Final   RBC 01/08/2024 4.48  3.87 - 5.11 MIL/uL Final   Hemoglobin 01/08/2024  12.7  12.0 - 15.0 g/dL Final   HCT 27/25/3664 39.9  36.0 - 46.0 % Final   MCV 01/08/2024 89.1  80.0 - 100.0 fL Final  MCH 01/08/2024 28.3  26.0 - 34.0 pg Final   MCHC 01/08/2024 31.8  30.0 - 36.0 g/dL Final   RDW 16/04/9603 14.2  11.5 - 15.5 % Final   Platelets 01/08/2024 271  150 - 400 K/uL Final   nRBC 01/08/2024 0.0  0.0 - 0.2 % Final   Neutrophils Relative % 01/08/2024 50  % Final   Neutro Abs 01/08/2024 2.9  1.7 - 7.7 K/uL Final   Lymphocytes Relative 01/08/2024 31  % Final   Lymphs Abs 01/08/2024 1.8  0.7 - 4.0 K/uL Final   Monocytes Relative 01/08/2024 15  % Final   Monocytes Absolute 01/08/2024 0.9  0.1 - 1.0 K/uL Final   Eosinophils Relative 01/08/2024 3  % Final   Eosinophils Absolute 01/08/2024 0.2  0.0 - 0.5 K/uL Final   Basophils Relative 01/08/2024 1  % Final   Basophils Absolute 01/08/2024 0.1  0.0 - 0.1 K/uL Final   Immature Granulocytes 01/08/2024 0  % Final   Abs Immature Granulocytes 01/08/2024 0.01  0.00 - 0.07 K/uL Final   Performed at [facility], [address] [place], [place] [address]    PSYCHIATRIC REVIEW OF SYSTEMS (ROS)  ROS: Notable for the following relevant positive findings: Review of Systems  Constitutional: Negative.   HENT: Negative.    Eyes: Negative.   Respiratory: Negative.    Cardiovascular: Negative.   Gastrointestinal: Negative.   Genitourinary: Negative.   Musculoskeletal: Negative.   Skin: Negative.   Neurological: Negative.   Endo/Heme/Allergies: Negative.   Psychiatric/Behavioral:  Positive for memory loss (dementia with behavioral disturbance).     Additional findings:      Musculoskeletal: No abnormal movements observed      Gait & Station: Laying/Sitting      Pain Screening: Denies      Nutrition & Dental Concerns: If yes - consider referral to nutritional or dental specialist  RISK FORMULATION/ASSESSMENT  Is the patient experiencing any suicidal or homicidal ideations: No       Explain if yes:  Protective  factors considered for safety management: Social supports, family supports  Risk factors/concerns considered for safety management:  Physical illness/chronic pain Age over 11 Impulsivity Aggression Unmarried  Is there a Astronomer plan with the patient and treatment team to minimize risk factors and promote protective factors: Yes           Explain: Medication adjustment Is crisis care placement or psychiatric hospitalization recommended: No     Based on my current evaluation and risk assessment, patient is determined at this time to be at:  Low risk  *RISK ASSESSMENT Risk assessment is a dynamic process; it is possible that this patient's condition, and risk level, may change. This should be re-evaluated and managed over time as appropriate. Please re-consult psychiatric consult services if additional assistance is needed in terms of risk assessment and management. If your team decides to discharge this patient, please advise the patient how to best access emergency psychiatric services, or to call 911, if their condition worsens or they feel unsafe in any way.   Loel Ring, NP Telepsychiatry Consult Services

## 2024-01-08 NOTE — ED Provider Notes (Signed)
 [facility] Provider Note    Event Date/Time   First MD Initiated Contact with Patient 01/08/24 [phone]     (approximate)   History   Aggressive Behavior   HPI  Penny Hart is a 78 y.o. female with a history of CAD, COPD, type 2 diabetes, hypertension, diverticulosis, and dementia who presents with apparent aggressive behavior at her facility.  Per EMS, the patient was threatening staff and entering other patient's rooms.  Currently, the patient is calm and cooperative.  She denies any acute medical complaints other than stating that she feels cold in her room.  I reviewed the past medical records.  The patient was seen in the ED and evaluated by psychiatry on 5/18 after presenting from home due to agitation.  She was not recommended for inpatient psychiatric admission.  She boarded in the ED through 6/12 at which time she was discharged to an SNF.   Physical Exam   Triage Vital Signs: ED Triage Vitals  Encounter Vitals Group     BP      Girls Systolic BP Percentile      Girls Diastolic BP Percentile      Boys Systolic BP Percentile      Boys Diastolic BP Percentile      Pulse      Resp      Temp      Temp src      SpO2      Weight      Height      Head Circumference      Peak Flow      Pain Score      Pain Loc      Pain Education      Exclude from Growth Chart     Most recent vital signs: Vitals:   01/08/24 0720  BP: (!) 94/59  Pulse: 63  Resp: 18  Temp: 98.2 F (36.8 C)  SpO2: 94%    General:  Alert, oriented x 2, no distress.  CV:  Good peripheral perfusion.  Resp:  Normal effort.  Abd:  No distention.  Other:  Calm and cooperative.  Motor intact in all extremities.  Normal speech.   ED Results / Procedures / Treatments   Labs (all labs ordered are listed, but only abnormal results are displayed) Labs Reviewed  BASIC METABOLIC PANEL WITH GFR - Abnormal; Notable for the following components:      Result Value    Glucose, Bld 106 (*)    All other components within normal limits  CBC WITH DIFFERENTIAL/PLATELET  URINALYSIS, ROUTINE W REFLEX MICROSCOPIC     EKG   RADIOLOGY   PROCEDURES:  Critical Care performed: No  Procedures   MEDICATIONS ORDERED IN ED: Medications - No data to display   IMPRESSION / MDM / ASSESSMENT AND PLAN / ED COURSE  I reviewed the triage vital signs and the nursing notes.  78 year old female with PMH as noted above presents with an episode of aggressive behavior at her facility.  She is currently calm and cooperative and denies any acute complaints.  On exam she is borderline hypotensive.  Other vital signs are normal.  Neurologic exam is nonfocal.  Differential diagnosis includes, but is not limited to, dementia, UTI, metabolic disturbance.  We will obtain basic labs, urinalysis, psychiatry TTS consult, and reassess.  Patient's presentation is most consistent with severe exacerbation of chronic illness.  The patient is on the cardiac monitor to evaluate for evidence of arrhythmia  and/or significant heart rate changes.  The patient has been placed in psychiatric observation due to the need to provide a safe environment for the patient while obtaining psychiatric consultation and evaluation, as well as ongoing medical and medication management to treat the patient's condition.  The patient has not been placed under full IVC at this time.   ----------------------------------------- 4:16 PM on 01/08/2024 -----------------------------------------  BMP and CBC show no acute findings.  I consulted and discussed case with the telepsych provider who advises that there is no indication for inpatient psychiatric admission, the patient will be cleared for discharge.  Urinalysis is still pending.  I have signed the patient out to the oncoming ED physician Dr. Hendrick Locke.  FINAL CLINICAL IMPRESSION(S) / ED DIAGNOSES   Final diagnoses:  Agitation     Rx / DC Orders    ED Discharge Orders     None        Note:  This document was prepared using Dragon voice recognition software and may include unintentional dictation errors.    Lind Repine, MD 01/08/24 [phone]

## 2024-01-08 NOTE — ED Triage Notes (Signed)
 Pt to ED from [facility] with aggressive behavior per staff at facility. Staff states she was entering other resident's rooms and threatening staff. Hx dementia

## 2024-01-08 NOTE — ED Notes (Signed)
 Pt going back to [facility]

## 2024-01-08 NOTE — ED Notes (Signed)
 Recommendations: Medication recommendations: Increase Seroquel  to 50 mg po BID and 25 mg po Q3 PM Non-Medication/therapeutic recommendations: Discharge back to memory care unit

## 2024-01-08 NOTE — ED Notes (Signed)
 Pt from [facility]  Address: [address], [place], [place] [address] [place] point memory care unit

## 2024-01-16 NOTE — Progress Notes (Signed)
 Ref Provider: Lenon Layman Donath* PCP: Lenon Layman Tanda DOUGLAS, MD  Assessment and Plan:   In most patients, we give written parts of the assessment and plan to put the patient under Patient Instructions/After Visit Summary. So some parts are directed to the patient.  Dear Ms. Penny Hart, It was our pleasure to participate in your care. We have typed up a summary of what we discussed. 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 She resides in a memory care/skilled nursing facility. Recent medication adjustments have been made to address mood disturbances. No new symptoms aside from agitation. No recent falls. Appetite and voiding are adequate. Sleep disturbances persist, with reports of nocturnal wakefulness. Psychiatric involvement is planned for medication management and long-term care. - Increase Lamotrigine  to 75 mg by mouth two times a day (twice daily) as recommended on 01/16/2024  - Labs in 3 to 4 weeks: Lamotrigine , CBC, CMP - Urgent Referral to Psychiatry  - Repeat lamotrigine  level and lab work in 3-4 weeks post-dose adjustment. - Ensure facility can perform necessary lab work with provided orders. - Urgent psychiatry referral for medication and long-term care management. - Monitor sleep disturbances and consider additional interventions if needed.  Imbalance Uses cane. Refusing to use wheelchair. High falls risk. No interval falls since last visit.    Advance Care Planning  Discussed power of attorney, DNR, and will.  Advised son to go to hospital [facility] and ask for living will and POA paperwork.  DNR forms provided. Called daughter over the phone to discuss patient's care. Discussed and filled out DNR (Do Not  Resuscitate) form for Penny Hart during visit on 08/02/23.  In the event of cardiac and/or pulmonary arrest of the patient, efforts at cardiopulmonary resuscitation of the patient SHOULD NOT be initiated. This order does not affect other medically indicated comfort care.   I have documented the basis for this order (advancing dementia) and consent from Penny Hart (self) and Penny Hart. This order has been entered into patient's electronic health record as of 08/02/23.   Return in about 4 months (around 05/18/2024) for Memory, With Allyson Stallion, NP.  Interim History date 01/17/2024   Penny Hart is a 78 y.o. female here for treatment and evaluation of Advanced Mixed Dementia , accompanied by care giver.    Ms. Lippard last visit was on 08/02/2023  History of Present Illness Penny Hart is a 78 year old female who presents with agitation.  She has had multiple emergency room visits for agitation, with notable visits on Dec 11, 2023, and January 08, 2024. She is currently residing in a memory care facility/skilled nursing facility.  Her medications have recently changed in an attempt to manage mood disturbances and agitation. She has been described as 'very combative,' often physically aggressive. They have not increased Lamotrigine  yet.   There are reports of her being 'up all night,' although she denies any recent falls. Her appetite is reportedly good, and she is eating and voiding well.  No new symptoms aside from agitation.  Patient has had multiple hospital encounters since previous office visit for agitation. Per hospital encounter, the patient was seen in the ED and evaluated by psychiatry on 12/10/23 after presenting from home due to agitation. She was not recommended for inpatient psychiatric admission. She boarded in the ED  through 01/04/24 at which time she was discharged to an SNF. Per EMS, the patient was threatening staff and entering other patient's rooms. On 01/16/24,  patients Lamotrigine  was increased from 50 mg twice daily to 75 mg twice daily.   Patient states she has been doing okay and her appetite has been. She expressed that there has not been any changes in her balance and she denies having any falls. During observation, patient requires assistants to assist with walking, patient unsteady on feet. She is currently using a cane, per care giver patient refused a wheelchair.  Disease Summary: (Aggregate of information from previous visits)   Penny Hart is a right handed 78 y.o. female retired here for evaluation of Advanced Mixed Dementia  , referred by [employer]  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 Patient does not believe she has trouble with her memory. Patient is currently managing her own finances. She is not currently driving. She is managing her own medications but with mistakes - she has forgotten to take some doses. She cooks occasionally. Her loved ones have been bringing her meals. Son and daughter states medication management is an issue. Patient does not want anyone coming into her house to manage anything.  Daughter reports that her ex is coming to her mother's house and has been taking things from her mother's fridge. Reports her ex may have stolen her brother's insoles while at her mother's house. It is also possible her mother moved things and forgot. Daughter reports her mother won't listen to her when she says she should be careful.   Daughter does not reside with patient. Patient has two other sons. One, who was initial live-in caretaker for patient has moved to [place] . Daughter reports the patient has called police on this son who lived with patient as patient did not want her son to have company in her  home. Daughter states patient was accusing son, who now lives in [place], of stealing from her. Prior to increase in Seroquel , patient would be up through the night going through her clothes until 4 to 5am. Increased seroquel  has been helpful. Daughter is very tearful. Notes she takes patient to her appointments. Daughter is managing patient medications. Has a camera in the home. Denies safety concern with current son residing with patient. Current son is cooking, cleaning  But unable to manage medications. Notes she feels more comfortable with patient residing with this son.   Son residing with patient as June 2023. Cameras installed in house. Essentially 24/7 supervision.   Per son, he has seem changes recently especially with the mood and memory. She is sometimes forgetting family members, especially ones that she is not with every day. She is currently living with her son. She is able to groom herself and clothe herself. No problems with eating. He reports she is doing well other than the memory/ mood. He does report continued episodes of aggression and irritability especially when she cannot fall asleep. They are in the process of getting caretakers to help out. He is requesting a doctor's note for him to be POA.  \ Micky Index of independence in activities of daily living 01/28/2021: 6 [place] Instrumental Activities of Daily Living Scale 01/28/2021: 5   SLUMS 10/19/2021 - unable to complete   Discussed and filled out DNR (Do Not Resuscitate)  form for Penny Hart during visit on 08/02/23.   MRI Brain without contrast with NeuroQuant  04/11/2022 - Volumetric analysis of the brain was performed, with a fully detailed report in [facility]. Briefly, the comparison with age and gender matched reference reveals generalized cortical gray matter volume loss with relative preservation of white matter. Hippocampal volume loss is present in addition. No acute or subacute insult. Generalized brain volume loss with  mild temporal lobe predominance. Mild chronic small-vessel ischemic change of the cerebral hemispheric white matter, similar to last year's exam.     Medications: Donepezil  (discontinued for bradycardia, patient concern for polypharmacy) , Memantine (prescribed, unclear if had given trial), Mirtazapine , Seroquel , Lamotrigine    Post concussion with lasting symptoms of headache, poor vision, imbalance, nose bleeds, dizziness and cognitive issues with some migrainous symptoms, in a patient with history of cortical atrophy, low Vit B12 on injections: Patient states she had injury in late March 17 2018. [place], hit her head on a wall. Patient required stitches. Patient right temple was injured. Patient did not have any amnesia before or after fall. Patient did not lose consciousness, no seizure was observed. Patient continues to have issues with balance, poor vision and headaches. Patient has history of headaches. Patient has had nose bleeds as well. Patient mentions that she had an episode hit her let side head 05/26/2018. Patient states her cabinet door open, patient states her head felt funny, patient states she put her head down and hit her on the cabinet. Patient denies temporal artery tenderness.  CT head without contrast 04/05/2018: No mass or hemorrhage. Gray-white compartments normal. There are foci of arterial vascular calcification. There is mucosal thickening in several ethmoid air cells. Stable appearing orbits bilaterally.  MRI brain without contrast 06/25/2018: No acute or reversible finding. Mild small vessel change of the cerebral hemispheric white matter. NeuroQuant volumetric analysis of the brain, see details on [employer]. Generalized cortical gray matter volume loss.  Medications tried: Preventative: Magnesium  Rescue: baclofen   Imbalance - no new falls since last visit: Patient daughter states patient has had a few falls in the past few months,  including fall were patient sustained concussion. Patient states she currently ambulates with a cane. Sent Home Physical Therapy for balance therapy (not completed).    Hypertension: Following Cardiology   Hypovitaminosis - low Vit B12  Right knee pain and reported instability: Orthopedics referral for right knee instability (not completed)  Right-sided low back pain without radicular symptoms:  Xray Lumbar and Thoracic spine 01/17/2022: Identical report to xray lumbar spine. See result note. Will add should continue vascular risk factor control and discuss atherosclerosis changes noted in aorta with primary care provider at next visit. Mild right curvature of thoracic spine as expected, osteopenia, otherwise unremarkable T-spine evaluation. Post-surgical changes following L4-L5 fusion in lumbar spine + degenerative changes at L3/L4. Osteopenia here as well. Follow-up with primary care provider re: low back pain.  Physical Exam   Vitals Vitals:   01/17/24 0856  BP: 114/66  Pulse: 60  SpO2: 95%  Height: 167.6 cm (5' 6)  PainSc: 0-No pain        Body mass index is 29.05 kg/m.  General Exam: Agitated/frustrated, raises voice, repeats self, difficult to redirect  01/17/2022 - very pleasant/kind   Neurological Exam: Month, year, day correct  Months backward: missed April  Words that start with K: 5  Right eye ptosis - decreased vision  On palpation the  right knee is tender medially and enlarged compared to the left. Pain with ambulation.  Right-sided back pain, tenderness to palpation Thoracic levocurvature   08/17/2022:  Month - correct  Year - 2023 incorrect  Four kids - correct   Medications: Current Outpatient Medications on File Prior to Visit  Medication Sig Dispense Refill  . aspirin  81 MG EC tablet Take 1 tablet (81 mg total) by mouth once daily 90 tablet 3  . dorzolamide -timoloL  (COSOPT ) 22.3-6.8 mg/mL ophthalmic solution Place 1 drop into the left eye 2 (two)  times daily 10 mL 11  . isosorbide  mononitrate (IMDUR ) 30 MG ER tablet Take 1 tablet (30 mg total) by mouth once daily 90 tablet 3  . lamoTRIgine  (LAMICTAL ) 25 MG tablet Take 3 tablets (75 mg total) by mouth 2 (two) times daily 540 tablet 3  . latanoprost  (XALATAN ) 0.005 % ophthalmic solution 1 drop at bedtime    . metoprolol  SUCCinate (TOPROL -XL) 25 MG XL tablet Take 1 tablet (25 mg total) by mouth once daily 90 tablet 3  . mirtazapine  (REMERON ) 15 MG tablet Take 1 tablet (15 mg total) by mouth at bedtime 90 tablet 1  . potassium chloride  (KLOR-CON  M10) 10 mEq ER tablet Take 1 tablet (10 mEq total) by mouth 2 (two) times daily 180 tablet 3  . pravastatin  (PRAVACHOL ) 10 MG tablet Take 1 tablet (10 mg total) by mouth at bedtime 90 tablet 3  . QUEtiapine  (SEROQUEL ) 25 MG tablet Take 25 mg by mouth once daily At 3pm    . QUEtiapine  (SEROQUEL ) 50 MG tablet Take 50 mg by mouth 2 (two) times daily    . TORsemide  (DEMADEX ) 20 MG tablet Take 1 tablet (20 mg total) by mouth once daily 90 tablet 3  . cetirizine (ZYRTEC) 10 MG tablet Take 1 tablet by mouth once daily (Patient not taking: Reported on 01/17/2024) 30 tablet 11  . mirtazapine  (REMERON ) 7.5 MG tablet Take 1 tablet (7.5 mg total) by mouth at bedtime (Patient not taking: Reported on 01/17/2024) 90 tablet 3  . nitroGLYcerin (NITROSTAT) 0.4 MG SL tablet Place 1 tablet (0.4 mg total) under the tongue every 5 (five) minutes as needed for Chest pain May take up to 3 doses. (Patient not taking: Reported on 01/17/2024) 25 tablet 1  . QUEtiapine  150 mg Tab Take 1 tablet by mouth at bedtime (Patient not taking: Reported on 01/17/2024) 90 tablet 1   No current facility-administered medications on file prior to visit.   Past Medical History:  Past Medical History:  Diagnosis Date  . Bulging discs    Lumbar area  . Cervicalgia   . Chronic obstructive asthma, unspecified (CMS/HHS-HCC)   . Diabetes mellitus type 2, uncomplicated (CMS/HHS-HCC)   . Gastritis  05/06/14  . Glaucoma (increased eye pressure)   . Headache   . History of Barrett's esophagus 05/06/14  . Hypertension   . Osteoarthritis   . Pure hypercholesterolemia   . Reflux esophagitis 05/06/14  . Sleep apnea   . Vision abnormalities     Past Surgical History:  Past Surgical History:  Procedure Laterality Date  . HYSTERECTOMY  07/26/1967  . GLAUCOMA EYE SURGERY Right 10/31/2011   pciol/trab express  . GLAUCOMA EYE SURGERY Right 04/02/2012   Trab  . TRABECULECTOMY AB EXTERNO Right 04/02/2012   Procedure: TRABECULECTOMY AB EXTERNO;  Surgeon: Meribeth LELON Crete, MD;  [facility];  Service: Ophthalmology;  Laterality: Right;  . TRABECULECTOMY AB EXTERNO Left 06/25/2012   Procedure: TRABECULECTOMY AB EXTERNO Express  device;  Surgeon: Meribeth LELON Crete, MD;  [facility];  Service: Ophthalmology;  Laterality: Left;  . EXTRACTION CATARACT EXTRACAPSULAR W/INSERTION INTRAOCULAR PROSTHESIS Left 06/25/2012   Procedure: EXTRACTION CATARACT EXTRACAPSULAR WITH PHACO WITH INSERTION INTRAOCULAR PROSTHESIS;  Surgeon: Meribeth LELON Crete, MD;  [facility];  Service: Ophthalmology;  Laterality: Left;  . COLONOSCOPY  07/10/2012   repeat 07/11/2015  . COLONOSCOPY  07/10/2012  . EGD  01/22/2013   repeat 01/22/2014  . EGD  01/22/2013  . EGD  05/06/2014   repeat 2 years per mus  . REPAIR/REVISION OPERATIVE WOUND ANTERIOR SEGMENT Right 01/07/2019   Procedure: Right eye bleb revision;  Surgeon: Crete Meribeth LELON, MD;  [facility];  Service: Ophthalmology;  Laterality: Right;  . TRABECULECTOMY AB EXTERNO Left 05/27/2019   Procedure: TRABECULECTOMY Left eye with Dextenza ;  Surgeon: Crete Meribeth LELON, MD;  [facility];  Service: Ophthalmology;  Laterality: Left;  . COLONOSCOPY  06/01/2020   Tubular adenoma/PHx CP/Repeat 51yrs/CTL  . EGD  06/01/2020   Negative EGD biopsy/Repeat as needed/CTL  . INSERTION AQUEOUS SHUNT Left 03/22/2021   Procedure: LEFT  Glaucoma drainage device with Dextenza  Left eye;  Surgeon: Crete Meribeth LELON, MD;  Location: [facility];  Service: Ophthalmology;  Laterality: Left;  . INSJ RX ELUTING IMPLT PUNCTAL DILAT LAC CANAL EA  Left 03/22/2021   Procedure: LEFT Glaucoma drainage device with Dextenza  Left eye;  Surgeon: Crete Meribeth LELON, MD;  Location: [facility];  Service: Ophthalmology;  Laterality: Left;  . ENUCLEATION EYE W/IMPLANT & ATTACHMENT MUSCLES Right 03/24/2023   Procedure: ENUCLEATION OF EYE; WITH IMPLANT, MUSCLES ATTACHED TO IMPLANT;  Surgeon: Barrington Selinda Righter, MD;  [facility];  Service: Ophthalmology;  Laterality: Right;  . COLONOSCOPY    . JOINT REPLACEMENT     knee  . OOPHORECTOMY    . PELVIC LAPAROSCOPY     lysis of adhesions  . UPPER GASTROINTESTINAL ENDOSCOPY     Family History:  Family History  Problem Relation Name Age of Onset  . Heart disease Mother    . High blood pressure (Hypertension) Mother    . Glaucoma Mother    . Diabetes type II Mother    . Myocardial Infarction (Heart attack) Mother    . Cancer Father         brain  . Diabetes type II Sister    . High blood pressure (Hypertension) Sister    . Cancer Maternal Grandmother    . Heart disease Maternal Grandfather    . Anesthesia problems Neg Hx     Social History:  Social History   Socioeconomic History  . Marital status: Widowed  Tobacco Use  . Smoking status: Former    Current packs/day: 0.00    Average packs/day: 0.5 packs/day for 15.0 years (7.5 ttl pk-yrs)    Types: Cigarettes    Start date: 03/31/1959    Quit date: 03/30/1974    Years since quitting: 49.8  . Smokeless tobacco: Never  Vaping Use  . Vaping status: Never Used  Substance and Sexual Activity  . Alcohol use: No    Alcohol/week: 0.0 standard drinks of alcohol  . Drug use: No  . Sexual activity: Defer  Social History Narrative   Lives alone.   Social Drivers of Health   Transportation Needs: No Transportation Needs (03/10/2023)    Received from [facility] - Transportation   . Lack of Transportation (Medical): No   . Lack of  Transportation (Non-Medical): No  Housing Stability: Unknown (08/01/2023)   Housing Stability Vital Sign   . Homeless in the Last Year: No   Allergies:  Allergies  Allergen Reactions  . Oxycodone -Acetaminophen  Rash    UNSPECIFIED REACTION    . Tramadol Other (See Comments)    Chest pain   This note has been created using automated tools and reviewed for accuracy by KAITLIN CAFFARO.  Parts of this note were created with a combination of Dragon dictation and automated scribing services. Please note any typographical errors are unintentional. I apologize for any typographical errors that were not detected and corrected.   Return in about 4 months (around 05/18/2024) for Memory, With Allyson Stallion, NP. The patient will contact us  earlier if severity of neurologic symptoms increases or changes. This may or may not, depending on description of symptoms, necessitate an earlier appointment.    Payor: [facility] MEDICARE ADVANTAGE PLAN / Plan: [facility] HMO POS AARP MEDICARE ADVANTAGE / Product Type: HMO /    Attestation Statement:   I personally performed the service, non-incident to. (WP)   KAITLIN CAFFARO, PA   Kaitlin Caffaro, PA-C Board Certified by [facility] - Neurology  A [facility]

## 2024-01-20 ENCOUNTER — Emergency Department

## 2024-01-20 ENCOUNTER — Emergency Department
Admission: EM | Admit: 2024-01-20 | Discharge: 2024-01-21 | Disposition: A | Discharge status: Home / Self Care | Attending: Emergency Medicine | Admitting: Emergency Medicine

## 2024-01-20 ENCOUNTER — Other Ambulatory Visit: Payer: Self-pay

## 2024-01-20 ENCOUNTER — Encounter: Payer: Self-pay | Admitting: Emergency Medicine

## 2024-01-20 DIAGNOSIS — I110 Hypertensive heart disease with heart failure: Secondary | ICD-10-CM | POA: Diagnosis not present

## 2024-01-20 DIAGNOSIS — J449 Chronic obstructive pulmonary disease, unspecified: Secondary | ICD-10-CM | POA: Insufficient documentation

## 2024-01-20 DIAGNOSIS — R102 Pelvic and perineal pain: Secondary | ICD-10-CM | POA: Insufficient documentation

## 2024-01-20 DIAGNOSIS — R109 Unspecified abdominal pain: Secondary | ICD-10-CM | POA: Insufficient documentation

## 2024-01-20 DIAGNOSIS — I509 Heart failure, unspecified: Secondary | ICD-10-CM | POA: Insufficient documentation

## 2024-01-20 DIAGNOSIS — F03918 Unspecified dementia, unspecified severity, with other behavioral disturbance: Secondary | ICD-10-CM

## 2024-01-20 DIAGNOSIS — E119 Type 2 diabetes mellitus without complications: Secondary | ICD-10-CM | POA: Diagnosis not present

## 2024-01-20 DIAGNOSIS — F03911 Unspecified dementia, unspecified severity, with agitation: Secondary | ICD-10-CM | POA: Diagnosis not present

## 2024-01-20 DIAGNOSIS — Z96652 Presence of left artificial knee joint: Secondary | ICD-10-CM | POA: Diagnosis not present

## 2024-01-20 DIAGNOSIS — K5730 Diverticulosis of large intestine without perforation or abscess without bleeding: Secondary | ICD-10-CM | POA: Insufficient documentation

## 2024-01-20 DIAGNOSIS — R451 Restlessness and agitation: Secondary | ICD-10-CM | POA: Diagnosis present

## 2024-01-20 HISTORY — DX: Unspecified dementia, unspecified severity, without behavioral disturbance, psychotic disturbance, mood disturbance, and anxiety: F03.90

## 2024-01-20 LAB — COMPREHENSIVE METABOLIC PANEL WITH GFR
ALT: 19 U/L (ref 0–44)
AST: 26 U/L (ref 15–41)
Albumin: 3.6 g/dL (ref 3.5–5.0)
Alkaline Phosphatase: 74 U/L (ref 38–126)
Anion gap: 8 (ref 5–15)
BUN: 8 mg/dL (ref 8–23)
CO2: 25 mmol/L (ref 22–32)
Calcium: 9.2 mg/dL (ref 8.9–10.3)
Chloride: 105 mmol/L (ref 98–111)
Creatinine, Ser: 0.94 mg/dL (ref 0.44–1.00)
GFR, Estimated: >60 mL/min (ref >60)
Glucose, Bld: 108 mg/dL — ABNORMAL HIGH (ref 70–99)
Potassium: 3.6 mmol/L (ref 3.5–5.1)
Sodium: 138 mmol/L (ref 135–145)
Total Bilirubin: 0.6 mg/dL (ref 0.0–1.2)
Total Protein: 7.4 g/dL (ref 6.5–8.1)

## 2024-01-20 LAB — URINALYSIS, ROUTINE W REFLEX MICROSCOPIC
Bilirubin Urine: NEGATIVE
Glucose, UA: NEGATIVE mg/dL
Hgb urine dipstick: NEGATIVE
Ketones, ur: NEGATIVE mg/dL
Leukocytes,Ua: NEGATIVE
Nitrite: NEGATIVE
Protein, ur: NEGATIVE mg/dL
Specific Gravity, Urine: 1.008 (ref 1.005–1.030)
pH: 5 (ref 5.0–8.0)

## 2024-01-20 LAB — CBC
HCT: 38.9 % (ref 36.0–46.0)
Hemoglobin: 12.2 g/dL (ref 12.0–15.0)
MCH: 28.3 pg (ref 26.0–34.0)
MCHC: 31.4 g/dL (ref 30.0–36.0)
MCV: 90.3 fL (ref 80.0–100.0)
Platelets: 282 10*3/uL (ref 150–400)
RBC: 4.31 MIL/uL (ref 3.87–5.11)
RDW: 14.4 % (ref 11.5–15.5)
WBC: 5.6 10*3/uL (ref 4.0–10.5)
nRBC: 0 % (ref 0.0–0.2)

## 2024-01-20 MED ORDER — HALOPERIDOL LACTATE 5 MG/ML IJ SOLN
2.0000 mg | Freq: Once | INTRAMUSCULAR | Status: AC
  Administered 2024-01-20: 2 mg via INTRAMUSCULAR
  Filled 2024-01-20: qty 1

## 2024-01-20 MED ORDER — QUETIAPINE FUMARATE 25 MG PO TABS: 50.0000 mg | ORAL_TABLET | ORAL | Status: DC

## 2024-01-20 NOTE — ED Notes (Signed)
 Pt ambulating in the hallway. This tech was able to assist pt back in her rm. I assisted pt to make her bed and gave pt towels and wash clothes to fold. Pt sitting on the bed folding laundry.

## 2024-01-20 NOTE — ED Notes (Signed)
 Pt laying in bed with both side rails up at this time.

## 2024-01-20 NOTE — ED Notes (Signed)
 Pt is sitting in the wheelchair altered and non-combative at this time.  The pt was asked if she would like to be assisted into bed and she stated  I can walk and will get into bed when I'm ready   The pt's door was left open and posey alarm was placed on the stretcher.

## 2024-01-20 NOTE — Discharge Instructions (Signed)
 Your lab test and CT scan of the abdomen were all okay today.  Continue taking all of your medications as usual and follow-up with your doctor.

## 2024-01-20 NOTE — ED Provider Notes (Signed)
 [facility] Provider Note    Event Date/Time   First MD Initiated Contact with Patient 01/20/24 1524     (approximate)   History   Chief Complaint: Agitation  HPI  Penny Hart is a 78 y.o. female with a history of dementia, hypertension diabetes CHF COPD who was brought to the ED from her facility due to agitation, aggressive behavior towards staff at her facility.  To me the patient reports suprapubic pain that has been off and on.  Unable to state the time course.  No aggravating or alleviating factors.  Mild.  Reports eating and drinking okay, denies nausea        Past Medical History:  Diagnosis Date   Arthritis    CHF (congestive heart failure) (HCC)    COPD (chronic obstructive pulmonary disease) (HCC)    Coronary artery disease    Dementia (HCC)    Diabetes mellitus without complication (HCC)    GERD (gastroesophageal reflux disease)    Glaucoma    Headache    Hiatal hernia    Hyperlipidemia    Hypertension    Neuropathy    Obesity    Peripheral neuropathy    Pseudophakia    Sleep apnea    Spinal stenosis    Spondylolisthesis of lumbar region    Thoracic aortic atherosclerosis [facility])     Current Outpatient Rx   Order #: [id number] Class: Normal   Order #: [id number] Class: Normal   Order #: [id number] Class: Normal   Order #: [id number] Class: Normal   Order #: [id number] Class: Normal   Order #: [id number] Class: Normal   Order #: [id number] Class: Normal   Order #: [id number] Class: Normal   Order #: [id number] Class: Normal   Order #: [id number] Class: Normal   Order #: [id number] Class: Normal   Order #: [id number] Class: Normal    Past Surgical History:  Procedure Laterality Date   ABDOMINAL HYSTERECTOMY     BACK SURGERY  2019   [facility]   BREAST EXCISIONAL BIOPSY Left yrs ago    benign   CATARACT EXTRACTION W/ INTRAOCULAR LENS  IMPLANT, BILATERAL     COLONOSCOPY N/A 06/01/2020   Procedure: COLONOSCOPY;  Surgeon: Maryruth Ole DASEN, MD;  Location: [facility] ENDOSCOPY;  Service: Endoscopy;  Laterality: N/A;   COLONOSCOPY WITH PROPOFOL  N/A 05/30/2015   Procedure: COLONOSCOPY WITH PROPOFOL ;  Surgeon: Rogelia Copping, MD;  Location: [facility] ENDOSCOPY;  Service: Endoscopy;  Laterality: N/A;   ESOPHAGOGASTRODUODENOSCOPY N/A 06/01/2020   Procedure: ESOPHAGOGASTRODUODENOSCOPY (EGD);  Surgeon: Maryruth Ole DASEN, MD;  Location: [facility] ENDOSCOPY;  Service: Endoscopy;  Laterality: N/A;   ESOPHAGOGASTRODUODENOSCOPY (EGD) WITH PROPOFOL  N/A 05/30/2015   Procedure: ESOPHAGOGASTRODUODENOSCOPY (EGD) WITH PROPOFOL ;  Surgeon: Rogelia Copping, MD;  Location: [facility] ENDOSCOPY;  Service: Endoscopy;  Laterality: N/A;   EYE SURGERY     Bialteral for glaucoma   Eye surgery for glaucoma     JOINT REPLACEMENT     Left   LEFT HEART CATH AND CORONARY ANGIOGRAPHY Left 01/30/2020   Procedure: LEFT HEART CATH AND CORONARY ANGIOGRAPHY;  Surgeon: Florencio Cara BIRCH, MD;  Location: [facility] INVASIVE CV LAB;  Service: Cardiovascular;  Laterality: Left;   Left total knee replacement     OOPHORECTOMY     PELVIC LAPAROSCOPY     POSTERIOR LUMBAR FUSION  04/27/2017   TUBAL LIGATION      Physical Exam   Triage Vital Signs: ED Triage Vitals  Encounter Vitals Group     BP 01/20/24 1442 101/62     Girls Systolic BP  Percentile --      Girls Diastolic BP Percentile --      Boys Systolic BP Percentile --      Boys Diastolic BP Percentile --      Pulse Rate 01/20/24 1442 (!) 52     Resp 01/20/24 1442 16     Temp 01/20/24 1442 98.9 F (37.2 C)     Temp Source 01/20/24 1442 Oral     SpO2 01/20/24 1442 94 %     Weight 01/20/24 1443 170 lb (77.1 kg)     Height 01/20/24 1443 5' 4 (1.626 m)     Head Circumference --      Peak Flow --      Pain Score 01/20/24 1443 0     Pain Loc --      Pain Education --      Exclude from Growth Chart --     Most recent vital signs: Vitals:   01/20/24 1442  BP: 101/62  Pulse: (!) 52  Resp: 16  Temp: 98.9 F (37.2 C)  SpO2: 94%     General: Awake, no distress.  CV:  Good peripheral perfusion.  Regular rate rhythm Resp:  Normal effort.  Auscultation bilaterally Abd:  No distention.  Soft with diffuse lower abdominal tenderness Other:  Most oral mucosa.     ED Results / Procedures / Treatments   Labs (all labs ordered are listed, but only abnormal results are displayed) Labs Reviewed  COMPREHENSIVE METABOLIC PANEL WITH GFR - Abnormal; Notable for the following components:      Result Value   Glucose, Bld 108 (*)    All other components within normal limits  URINALYSIS, ROUTINE W REFLEX MICROSCOPIC - Abnormal; Notable for the following components:   Color, Urine YELLOW (*)    APPearance CLEAR (*)    All other components within normal limits  CBC     EKG    RADIOLOGY CT abdomen pelvis interpreted by me, appears unremarkable.  Radiology report reviewed   PROCEDURES:  Procedures   MEDICATIONS ORDERED IN ED: Medications  haloperidol  lactate (HALDOL ) injection 2 mg (has no administration in time range)     IMPRESSION / MDM / ASSESSMENT AND PLAN / ED COURSE  I reviewed the triage vital signs and the nursing notes.  DDx: UTI, AKI, electrolyte derangement, diverticulitis, appendicitis, constipation, dementia with behavioral disturbance  Patient's presentation is most consistent with acute presentation with potential threat to life or bodily function.  Patient brought to the ED due to agitation.  Currently calm.  Vital signs unremarkable.  She is nontoxic.  History and exam suggestive of UTI.  Serum labs unremarkable.  Will check UA, if negative will proceed with CT.   ----------------------------------------- 7:34 PM on 01/20/2024 ----------------------------------------- UA and CT unremarkable.  Reassuring workup.  Patient is ambulatory, she is becoming more agitated as the evening goes on.  She is confused about her location, upset that her family has not here visiting her and that she has  not seen her son in days.  She is stable for discharge back to her facility.  We missed her afternoon dose of oral Seroquel  25 mg.  Will give a small dose of intramuscular Haldol  to help calm her for now pending discharge back to facility.      FINAL CLINICAL IMPRESSION(S) / ED DIAGNOSES   Final diagnoses:  Dementia with behavioral disturbance (HCC)     Rx / DC Orders   ED Discharge Orders     None  Note:  This document was prepared using Dragon voice recognition software and may include unintentional dictation errors.   Viviann Pastor, MD 01/20/24 WINDELL

## 2024-01-20 NOTE — ED Notes (Addendum)
 Attempted to call Penny Hart [facility] facility  and pt's niece twice with no answer.

## 2024-01-20 NOTE — ED Triage Notes (Signed)
 Pt presents to ER accompanied by sister and niece. Per niece pt has been combative, aggressive towards staff at the facility and other residents. Facility called family requesting to take pt for evaluation

## 2024-01-20 NOTE — ED Notes (Signed)
 Pt started to unplug and hit the computer. The pt became very aggressive and needed to be assisted back to the bed via 2 person hold. The pt was verbally deescalated for 10 minutes  and is sitting in the room eating ice cream at this time.

## 2024-01-21 NOTE — ED Notes (Signed)
 Able to get in contact with facility.  Facility staff Mel CNA asked if it was possible to keep pt in the ED tonight due to aggressive behaviors the pt was exhibiting over the past few days. Facility staff Mel CNA states they do not have the staff or PRN medications In order to redirect pt the pt.   Spoke with MD Cyrena with regards to same. States the pt can be discharged in the morning.    Pt is noted to be sleeping with equal chest rise and fall. Pt has not been combative since earlier today.

## 2024-01-21 NOTE — ED Notes (Signed)
 Attempted call to facility.  No answer, no messages.

## 2024-01-21 NOTE — ED Notes (Signed)
 Meal given

## 2024-01-21 NOTE — ED Notes (Signed)
 Spoke with Mr. Penny Hart who stated that he will be here to pick up pt in about 25 minutes.

## 2024-01-21 NOTE — ED Provider Notes (Signed)
 Able to get into contact with [facility] and they are willing to take her back in the morning.  Cancel TOC consultation hold till the morning for discharge.   Cyrena Mylar, MD 01/21/24 [phone]

## 2024-01-21 NOTE — ED Notes (Signed)
 Called [facility] [phone] no answer

## 2024-01-21 NOTE — ED Notes (Signed)
 Attempted call to faciltiy. No answer. Mailbox not accepting messages.

## 2024-01-21 NOTE — ED Notes (Signed)
Pt ambulatory to bathroom with one person assist 

## 2024-01-21 NOTE — ED Notes (Signed)
 This RN spoke with Mel [phone] at the facility who directed the this Rn to Mr. Valli [phone] that will be coming to pick up the patient.

## 2024-01-21 NOTE — ED Notes (Signed)
 Pt is awake sitting up in bed at this time.

## 2024-01-27 ENCOUNTER — Emergency Department
Admission: EM | Admit: 2024-01-27 | Discharge: 2024-01-29 | Disposition: A | Discharge status: Home / Self Care | Attending: Emergency Medicine | Admitting: Emergency Medicine

## 2024-01-27 ENCOUNTER — Other Ambulatory Visit: Payer: Self-pay

## 2024-01-27 DIAGNOSIS — R451 Restlessness and agitation: Secondary | ICD-10-CM | POA: Diagnosis present

## 2024-01-27 DIAGNOSIS — F0390 Unspecified dementia without behavioral disturbance: Secondary | ICD-10-CM | POA: Diagnosis not present

## 2024-01-27 DIAGNOSIS — I110 Hypertensive heart disease with heart failure: Secondary | ICD-10-CM | POA: Insufficient documentation

## 2024-01-27 DIAGNOSIS — I509 Heart failure, unspecified: Secondary | ICD-10-CM | POA: Insufficient documentation

## 2024-01-27 DIAGNOSIS — Z87891 Personal history of nicotine dependence: Secondary | ICD-10-CM | POA: Diagnosis not present

## 2024-01-27 DIAGNOSIS — R4689 Other symptoms and signs involving appearance and behavior: Secondary | ICD-10-CM

## 2024-01-27 DIAGNOSIS — J449 Chronic obstructive pulmonary disease, unspecified: Secondary | ICD-10-CM | POA: Diagnosis not present

## 2024-01-27 DIAGNOSIS — R456 Violent behavior: Secondary | ICD-10-CM | POA: Diagnosis not present

## 2024-01-27 DIAGNOSIS — E119 Type 2 diabetes mellitus without complications: Secondary | ICD-10-CM | POA: Insufficient documentation

## 2024-01-27 DIAGNOSIS — F4325 Adjustment disorder with mixed disturbance of emotions and conduct: Secondary | ICD-10-CM | POA: Insufficient documentation

## 2024-01-27 DIAGNOSIS — Z79899 Other long term (current) drug therapy: Secondary | ICD-10-CM | POA: Insufficient documentation

## 2024-01-27 DIAGNOSIS — F4329 Adjustment disorder with other symptoms: Secondary | ICD-10-CM | POA: Diagnosis not present

## 2024-01-27 LAB — COMPREHENSIVE METABOLIC PANEL WITH GFR
ALT: 14 U/L (ref 0–44)
AST: 23 U/L (ref 15–41)
Albumin: 4 g/dL (ref 3.5–5.0)
Alkaline Phosphatase: 73 U/L (ref 38–126)
Anion gap: 10 (ref 5–15)
BUN: 12 mg/dL (ref 8–23)
CO2: 25 mmol/L (ref 22–32)
Calcium: 9.4 mg/dL (ref 8.9–10.3)
Chloride: 103 mmol/L (ref 98–111)
Creatinine, Ser: 1.08 mg/dL — ABNORMAL HIGH (ref 0.44–1.00)
GFR, Estimated: 53 mL/min — ABNORMAL LOW
Glucose, Bld: 104 mg/dL — ABNORMAL HIGH (ref 70–99)
Potassium: 3.6 mmol/L (ref 3.5–5.1)
Sodium: 138 mmol/L (ref 135–145)
Total Bilirubin: 0.6 mg/dL (ref 0.0–1.2)
Total Protein: 8.4 g/dL — ABNORMAL HIGH (ref 6.5–8.1)

## 2024-01-27 LAB — CBC
HCT: 43.3 % (ref 36.0–46.0)
Hemoglobin: 13.5 g/dL (ref 12.0–15.0)
MCH: 27.9 pg (ref 26.0–34.0)
MCHC: 31.2 g/dL (ref 30.0–36.0)
MCV: 89.5 fL (ref 80.0–100.0)
Platelets: 342 K/uL (ref 150–400)
RBC: 4.84 MIL/uL (ref 3.87–5.11)
RDW: 14.6 % (ref 11.5–15.5)
WBC: 6.1 K/uL (ref 4.0–10.5)
nRBC: 0 % (ref 0.0–0.2)

## 2024-01-27 LAB — URINE DRUG SCREEN, QUALITATIVE (ARMC ONLY)
Amphetamines, Ur Screen: NOT DETECTED
Barbiturates, Ur Screen: NOT DETECTED
Benzodiazepine, Ur Scrn: NOT DETECTED
Cannabinoid 50 Ng, Ur ~~LOC~~: NOT DETECTED
Cocaine Metabolite,Ur ~~LOC~~: NOT DETECTED
MDMA (Ecstasy)Ur Screen: NOT DETECTED
Methadone Scn, Ur: NOT DETECTED
Opiate, Ur Screen: NOT DETECTED
Phencyclidine (PCP) Ur S: NOT DETECTED
Tricyclic, Ur Screen: POSITIVE — AB

## 2024-01-27 LAB — ETHANOL: Alcohol, Ethyl (B): <15 mg/dL (ref <15)

## 2024-01-27 MED ORDER — MIRTAZAPINE 15 MG PO TABS
15.0000 mg | ORAL_TABLET | Freq: Every day | ORAL | Status: DC
  Administered 2024-01-27: 15 mg via ORAL
  Filled 2024-01-27: qty 1

## 2024-01-27 MED ORDER — PRAVASTATIN SODIUM 20 MG PO TABS
10.0000 mg | ORAL_TABLET | Freq: Every evening | ORAL | Status: DC
  Administered 2024-01-27 – 2024-01-28 (×2): 10 mg via ORAL
  Filled 2024-01-27 (×2): qty 1

## 2024-01-27 MED ORDER — TORSEMIDE 20 MG PO TABS
20.0000 mg | ORAL_TABLET | Freq: Every day | ORAL | Status: DC
  Administered 2024-01-28 – 2024-01-29 (×2): 20 mg via ORAL
  Filled 2024-01-27 (×2): qty 1

## 2024-01-27 MED ORDER — POTASSIUM CHLORIDE CRYS ER 20 MEQ PO TBCR
10.0000 meq | EXTENDED_RELEASE_TABLET | Freq: Two times a day (BID) | ORAL | Status: DC
  Administered 2024-01-27 – 2024-01-29 (×4): 10 meq via ORAL
  Filled 2024-01-27 (×4): qty 1

## 2024-01-27 MED ORDER — ASPIRIN 81 MG PO TBEC
81.0000 mg | DELAYED_RELEASE_TABLET | Freq: Every day | ORAL | Status: DC
  Administered 2024-01-28 – 2024-01-29 (×2): 81 mg via ORAL
  Filled 2024-01-27 (×2): qty 1

## 2024-01-27 MED ORDER — METOPROLOL SUCCINATE ER 25 MG PO TB24
25.0000 mg | ORAL_TABLET | Freq: Every day | ORAL | Status: DC
  Administered 2024-01-28: 25 mg via ORAL
  Filled 2024-01-27 (×2): qty 1

## 2024-01-27 MED ORDER — LATANOPROST 0.005 % OP SOLN
1.0000 [drp] | Freq: Every day | OPHTHALMIC | Status: DC
  Administered 2024-01-27 – 2024-01-28 (×2): 1 [drp] via OPHTHALMIC
  Filled 2024-01-27: qty 2.5

## 2024-01-27 MED ORDER — ISOSORBIDE MONONITRATE ER 60 MG PO TB24
30.0000 mg | ORAL_TABLET | Freq: Every day | ORAL | Status: DC
  Administered 2024-01-28: 30 mg via ORAL
  Filled 2024-01-27 (×2): qty 1

## 2024-01-27 MED ORDER — LAMOTRIGINE 25 MG PO TABS
50.0000 mg | ORAL_TABLET | Freq: Two times a day (BID) | ORAL | Status: DC
  Administered 2024-01-27 – 2024-01-29 (×4): 50 mg via ORAL
  Filled 2024-01-27 (×4): qty 2

## 2024-01-27 MED ORDER — QUETIAPINE FUMARATE 25 MG PO TABS
50.0000 mg | ORAL_TABLET | Freq: Two times a day (BID) | ORAL | Status: DC
  Administered 2024-01-27 – 2024-01-28 (×2): 50 mg via ORAL
  Filled 2024-01-27 (×2): qty 2

## 2024-01-27 NOTE — ED Notes (Signed)
 Requested night meds ordered by EDP Paduchowski.

## 2024-01-27 NOTE — BH Assessment (Signed)
 Comprehensive Clinical Assessment (CCA) Screening, Triage and Referral Note  01/27/2024 Penny Hart [id number] Recommendations for Services/Supports/Treatments: Disposition pending. Penny Hart is a 78 year old, English speaking, Black female. Pt presented to [facility] ED under IVC. Per triage note: Pt to ed from group home with [facility] for IVC. Pt has dementia and was being aggressive to the staff at her facility. Pt was just seen here on 6/28 for same. Pt is alert, confused at baseline. Pt is in no acute distress.  Pt was lying awake in bed upon this writer's arrival. Pt's speech was loud. Pt reported that she doesn't like her current setting with poor lighting due to her inability to see and was fixated on not being roomed alone while in the ED. Pt was visibly in distress and appeared remarkably anxious. Pt became tearful when describing her stressors. Pt was loosely aware of why she was in the ED. Pt admitted to being aggressive to the staff at her facility stating, "Penny Hart, cuz no one likes to put in a dark room all the time.". Pt did not appear to be responding to internal or external stimuli. Eye contact was fair. Pt's mood is irritable; affect is congruent. Pt denied current SI/HI and AV/H.  Chief Complaint:  Chief Complaint  Patient presents with   Psychiatric Evaluation   Visit Diagnosis: Dementia with Behavioral Disturbance   Patient Reported Information How did you hear about us ? -- (EMS)  What Is the Reason for Your Visit/Call Today? Patient reports she is not sure why she was brought to the ED. Patient presents with some confusion.  How Long Has This Been Causing You Problems? > than 6 months  What Do You Feel Would Help You the Most Today? Medication(s)   Have You Recently Had Any Thoughts About Hurting Yourself? No  Are You Planning to Commit Suicide/Harm Yourself At This time? No   Have you Recently Had Thoughts About Hurting Someone Sherral? No  Are You Planning to Harm Someone at This Time? No  Explanation: No data recorded  Have You Used Any Alcohol or Drugs in the Past 24 Hours? No  How Long Ago Did You Use Drugs or Alcohol? No data recorded What Did You Use and How Much? No data recorded  Do You Currently Have a Therapist/Psychiatrist? No  Name of Therapist/Psychiatrist: No data recorded  Have You Been Recently Discharged From Any Office Practice or Programs? No  Explanation of Discharge From Practice/Program: No data recorded   CCA Screening Triage Referral Assessment Type of Contact: Face-to-Face  Telemedicine Service Delivery:   Is this Initial or  Reassessment?   Date Telepsych consult ordered in [facility]:    Time Telepsych consult ordered in [facility]:    Location of Assessment: [facility] ED  Provider Location: [facility] ED    Collateral Involvement: Penny Hart, son   Does Patient Have a Automotive engineer Guardian? No data recorded Name and Contact of Legal Guardian: No data recorded If Minor and Not Living with Parent(s), Who has Custody? No data recorded Is CPS involved or ever been involved? Never  Is APS involved or ever been involved? Never   Patient Determined To Be At Risk for Harm To Self or Others Based on Review of Patient Reported Information or Presenting Complaint? No  Method: No Plan  Availability of Means: No access or NA  Intent: Vague intent or NA  Notification Required: No need or identified person  Additional Information for Danger to Others Potential: No data recorded Additional Comments for Danger to Others Potential: No data recorded Are There Guns or Other Weapons in Your Home? No  Types of Guns/Weapons: No data recorded Are These Weapons Safely Secured?                            No data recorded Who Could Verify You Are Able To Have These Secured: No data recorded Do You Have any Outstanding Charges, Pending Court Dates, Parole/Probation? No data recorded Contacted To Inform of Risk of Harm To Self or Others: No data recorded  Does Patient Present under Involuntary Commitment? No    [place] of Residence: [place]   Patient Currently Receiving the Following Services: Medication Management   Determination of Need: Emergent (2 hours)   Options For Referral: ED Visit; Medication Management   Disposition Recommendation per psychiatric provider: Pending psych consult  Tayjon Halladay R Mayan Kloepfer, LCAS

## 2024-01-27 NOTE — ED Triage Notes (Signed)
 Pt to ed from group home with [facility] for IVC. Pt has dementia and was being aggressive to the staff at her facility. Pt was just seen here on 6/28 for same. Pt is alert, confused at baseline. Pt is in no acute distress.

## 2024-01-27 NOTE — ED Provider Notes (Signed)
   [facility] Provider Note    Event Date/Time   First MD Initiated Contact with Patient 01/27/24 1849     (approximate)  History   Chief Complaint: Psychiatric Evaluation  HPI  Gabriana KEARAH GAYDEN is a 78 y.o. female with a past medical history of CHF, COPD, dementia, hypertension, hyperlipidemia, presents to the emergency department from her group facility for agitation/aggressive behavior.  According to report patient was sent from a group facility after an episode of agitation.  Here the patient is awake alert patient is confused however reportedly at baseline.  Physical Exam   Triage Vital Signs: ED Triage Vitals [01/27/24 1811]  Encounter Vitals Group     BP 94/82     Girls Systolic BP Percentile      Girls Diastolic BP Percentile      Boys Systolic BP Percentile      Boys Diastolic BP Percentile      Pulse Rate 65     Resp 16     Temp 98.6 F (37 C)     Temp Source Oral     SpO2 96 %     Weight      Height 5' 4 (1.626 m)     Head Circumference      Peak Flow      Pain Score 0     Pain Loc      Pain Education      Exclude from Growth Chart     Most recent vital signs: Vitals:   01/27/24 1811  BP: 94/82  Pulse: 65  Resp: 16  Temp: 98.6 F (37 C)  SpO2: 96%    General: Awake, no distress.  Denies any medical complaints. CV:  Good peripheral perfusion.  Regular rate and rhythm  Resp:  Normal effort.  Equal breath sounds bilaterally.  Abd:  No distention.  Soft, nontender.  No rebound or guarding.  ED Results / Procedures / Treatments   MEDICATIONS ORDERED IN ED: Medications - No data to display   IMPRESSION / MDM / ASSESSMENT AND PLAN / ED COURSE  I reviewed the triage vital signs and the nursing notes.  Patient's presentation is most consistent with acute presentation with potential threat to life or bodily function.  Patient presents to the emergency department under IVC for aggressive behavior at her group facility.  We  will check labs will have psychiatry TTS evaluate.  No medical complaints currently.  Will uphold the IVC until patient can be adequately evaluated by psychiatry.  FINAL CLINICAL IMPRESSION(S) / ED DIAGNOSES   Agitation Dementia  Note:  This document was prepared using Dragon voice recognition software and may include unintentional dictation errors.   Dorothyann Drivers, MD 01/27/24 1859

## 2024-01-27 NOTE — BH Assessment (Signed)
 Telepsych consult requested via phone call and secure chat.

## 2024-01-27 NOTE — ED Notes (Addendum)
 Pt dressed out:  Flower shoes Black socks Black pants Blue shirt Yellow metal necklace Black bonnet Yellow metal ring Yellow metal bracelet Plastic bracelet Pink underwear Black shirt Brown and black bra

## 2024-01-28 DIAGNOSIS — R419 Unspecified symptoms and signs involving cognitive functions and awareness: Secondary | ICD-10-CM | POA: Diagnosis not present

## 2024-01-28 MED ORDER — HALOPERIDOL LACTATE 5 MG/ML IJ SOLN
5.0000 mg | Freq: Once | INTRAMUSCULAR | Status: AC
  Administered 2024-01-28: 5 mg via INTRAMUSCULAR
  Filled 2024-01-28: qty 1

## 2024-01-28 MED ORDER — QUETIAPINE FUMARATE 25 MG PO TABS
75.0000 mg | ORAL_TABLET | Freq: Two times a day (BID) | ORAL | Status: DC
  Administered 2024-01-28 – 2024-01-29 (×2): 75 mg via ORAL
  Filled 2024-01-28 (×2): qty 3

## 2024-01-28 MED ORDER — MIRTAZAPINE 15 MG PO TABS
30.0000 mg | ORAL_TABLET | Freq: Every day | ORAL | Status: DC
  Administered 2024-01-28: 30 mg via ORAL
  Filled 2024-01-28: qty 2

## 2024-01-28 NOTE — ED Notes (Signed)
 Patient ambulated with two assist to restroom by this RN and Hadassah, [employer]

## 2024-01-28 NOTE — ED Notes (Signed)
 Pt moved to room 20 for tele consult.

## 2024-01-28 NOTE — ED Notes (Signed)
 Pt moved back to 24 hallway bed.

## 2024-01-28 NOTE — ED Notes (Signed)
 IVC/pending psych consult

## 2024-01-28 NOTE — ED Notes (Signed)
 Patient assisted to restroom with two staff members. Patient helped back to bed and sitting on side of bed eating lunch tray at this time

## 2024-01-28 NOTE — ED Notes (Signed)
 Pt given dinner tray and water. This tech assisted pt with feeding. Once finished tech assisted pt to bathroom and back to bed.

## 2024-01-28 NOTE — BH Assessment (Signed)
 Writer attempted to reach pt's legal guardian Nanetta Cable with [facility] DSS [phone]); however there was no answer. Writer called after hours on call social worker ([facility] [phone]) and requested a call back.

## 2024-01-28 NOTE — ED Notes (Signed)
 Assumed care of patient, pt resting in bed with eyes shut, respirations even and unlabored, no distress noted

## 2024-01-28 NOTE — ED Provider Notes (Signed)
 Patient quite agitated this morning. Getting up, trying to leave the department. Aggressive with staff. Given concern for patient and staff safety patient was given medication to help calm her down.   Floy Roberts, MD 01/28/24 0930

## 2024-01-28 NOTE — ED Provider Notes (Signed)
 Emergency Medicine Observation Re-evaluation Note  Loveah AURILLA COULIBALY is a 78 y.o. female, seen on rounds today.  Pt initially presented to the ED for complaints of Psychiatric Evaluation Currently, the patient is resting.  Physical Exam  BP (!) 100/56 (BP Location: Left Arm)   Pulse 61   Temp 98.9 F (37.2 C)   Resp 18   Ht 5' 4 (1.626 m)   SpO2 96%   BMI 29.18 kg/m  Physical Exam .Gen:  No acute distress Resp:  Breathing easily and comfortably, no accessory muscle usage Neuro:  Moving all four extremities, no gross focal neuro deficits Psych:  Resting currently, calm when awake   ED Course / MDM  EKG:   I have reviewed the labs performed to date as well as medications administered while in observation.  Recent changes in the last 24 hours include no acute events.  Plan  Current plan is for pending psych disposition.    Waymond Lorelle Cummins, MD 01/28/24 [phone]

## 2024-01-28 NOTE — ED Notes (Signed)
 Pt wanted to use the BR, this NT and nurse Amber assisted pt. Pt on the way back to her bed was verbally abusive and naming calling. Pt has also been loud and aggressive. Pt thinks that we are in her house.

## 2024-01-28 NOTE — ED Notes (Signed)
 This tech assisted pt to ambulate to restroom and back to bed.

## 2024-01-28 NOTE — ED Notes (Signed)
 This tech helped pt ambulate to restroom and back into bed.

## 2024-01-28 NOTE — BH Assessment (Signed)
 TTS communicated with Iris about Psych Consult, via phone call and secure chat.

## 2024-01-28 NOTE — ED Notes (Signed)
 Patient loud, name calling, and verbally aggressive. Oriented to name only. Patient yelling at staff and other patients to get out of her house. MD made aware. Orders placed, see MAR

## 2024-01-28 NOTE — ED Provider Notes (Addendum)
-----------------------------------------   6:45 PM on 01/28/2024 -----------------------------------------  The patient has been evaluated by the telepsychiatrist and cleared.  They do not recommend inpatient admission.  The consultant recommended increasing the patient's doses of quetiapine  to 75 mg BID and mirtazapine  to 30 mg, which I have done.  We will observe her overnight to make sure that her symptoms improve with this adjustment.      Jacolyn Pae, MD 01/28/24 [phone]

## 2024-01-28 NOTE — Consult Note (Signed)
 [facility] Consult Note  Patient Name: Penny Hart MRN: [id number] DOB: Oct 21, 1945 DATE OF Consult: 01/28/2024  PRIMARY PSYCHIATRIC DIAGNOSES  1.  Neurocognitive disorder with behavioral disturbance 2.  T2DM,  3.  Agitation  RECOMMENDATIONS  Recommendations: Medication recommendations: consider increasing her quetiapine  to 75 mg po twice daily to consolidate the mid-day dose into her existing BID doses.  Can consider 25 mg PO BID PRN for first-line anxiety/agitation as well; monitor Qtc via EKG (stop if >544ms).  Could also titrate her evening mirtazapine  to 30 mg for mood/anxiety.  Is inpatient psychiatric hospitalization recommended for this patient? No (Explain why): no immediate danger to self or others at this time  Follow-Up Telepsychiatry C/L services: We will continue to follow this patient with you until stabilized or discharged.  If you have any questions or concerns, please call our TeleCare Coordination service at  [phone] and ask for myself or the provider on-call.  Communication: Treatment team members (and family members if applicable) who were involved in treatment/care discussions and planning, and with whom we spoke or engaged with via secure text/chat, include the following: RN  Thank you for involving us  in the care of this patient. If you have any additional questions or concerns, please call [phone] and ask for me or the provider on-call.  TELEPSYCHIATRY ATTESTATION & CONSENT  As the provider for this telehealth consult, I attest that I verified the patient's identity using two separate identifiers, introduced myself to the patient, provided my credentials, disclosed my location, and performed this encounter via a HIPAA-compliant, real-time, face-to-face, two-way, interactive audio and video platform and with the full consent and agreement of the patient (or guardian as applicable.)  Patient physical location: ED at [facility]. Telehealth  provider physical location: home office in state of [place] .  Video start time: 330p CST (Central Time) Video end time: 340p CST (Central Time)  IDENTIFYING DATA  Penny Hart is a 78 y.o. year-old female for whom a psychiatric consultation has been ordered by the primary provider. The patient was identified using two separate identifiers.  CHIEF COMPLAINT/REASON FOR CONSULT   Dementia; agitation   HISTORY OF PRESENT ILLNESS (HPI)   The patient is a 78 year old woman with history of dementia; referred to ED from group home due to agitation; reportedly with agitation this AM in ED. Seen in ED late June for similar symptoms; quetiapine  titrated and patient discharged to [facility].  When seen, the patient is awake but disoriented; states the year as late 1980's and does not recall how she got to where she is, though she does know she's in hospital. She'll deny any significant concern - denying SI, HI, AVH or history of aggression - though will complain about feeling cold and reports her group home staff annoy her when they put her in cold rooms. I inquired about her medications but she just stated she was fine.  Per chart, she had a mid-day quetiapine  dose recommended. At this time, I don't think she is an immediate danger to herself or others; similarly I do not think she requires Psychiatric admission at this time, however I do think she'd benefit from med titration and I'd consider increasing her quetiapine  to 75 mg po twice daily to consolidate the mid-day dose into her existing BID doses. Can consider 25 mg PO BID PRN for first-line anxiety/agitation as well; monitor Qtc via EKG (stop if >537ms). Could also titrate her evening mirtazapine  to 30 mg for mood/anxiety.  SABRA  PAST PSYCHIATRIC  HISTORY   Seen in ED for similar symptoms late June; No known history of suicide attempts, Per chart placed in Memory Care Unit mid-2025  Otherwise as per HPI above.  PAST MEDICAL HISTORY  Past Medical  History:  Diagnosis Date   Arthritis    CHF (congestive heart failure) (HCC)    COPD (chronic obstructive pulmonary disease) (HCC)    Coronary artery disease    Dementia (HCC)    Diabetes mellitus without complication (HCC)    GERD (gastroesophageal reflux disease)    Glaucoma    Headache    Hiatal hernia    Hyperlipidemia    Hypertension    Neuropathy    Obesity    Peripheral neuropathy    Pseudophakia    Sleep apnea    Spinal stenosis    Spondylolisthesis of lumbar region    Thoracic aortic atherosclerosis [facility])      HOME MEDICATIONS  Facility Ordered Medications  Medication   aspirin  EC tablet 81 mg   isosorbide  mononitrate (IMDUR ) 24 hr tablet 30 mg   metoprolol  succinate (TOPROL -XL) 24 hr tablet 25 mg   pravastatin  (PRAVACHOL ) tablet 10 mg   mirtazapine  (REMERON ) tablet 15 mg   QUEtiapine  (SEROQUEL ) tablet 50 mg   lamoTRIgine  (LAMICTAL ) tablet 50 mg   potassium chloride  SA (KLOR-CON  M) CR tablet 10 mEq   torsemide  (DEMADEX ) tablet 20 mg   latanoprost  (XALATAN ) 0.005 % ophthalmic solution 1 drop   [COMPLETED] haloperidol  lactate (HALDOL ) injection 5 mg   PTA Medications  Medication Sig   latanoprost  (XALATAN ) 0.005 % ophthalmic solution Place 1 drop into both eyes at bedtime.   lamoTRIgine  (LAMICTAL ) 25 MG tablet Take 2 tablets (50 mg total) by mouth 2 (two) times daily.   mirtazapine  (REMERON ) 15 MG tablet Take 1 tablet (15 mg total) by mouth at bedtime.   potassium chloride  (KLOR-CON  M) 10 MEQ tablet Take 1 tablet (10 mEq total) by mouth 2 (two) times daily.   pravastatin  (PRAVACHOL ) 10 MG tablet Take 1 tablet (10 mg total) by mouth every evening.   torsemide  (DEMADEX ) 20 MG tablet Take 1 tablet (20 mg total) by mouth daily.   aspirin  EC 81 MG tablet Take 1 tablet (81 mg total) by mouth daily. Swallow whole.   isosorbide  mononitrate (IMDUR ) 30 MG 24 hr tablet Take 1 tablet (30 mg total) by mouth daily.   metoprolol  succinate (TOPROL -XL) 25 MG 24 hr tablet Take 1  tablet (25 mg total) by mouth daily.   QUEtiapine  (SEROQUEL ) 25 MG tablet Take 1 tablet (25 mg total) by mouth daily in the afternoon. 3 pm   QUEtiapine  (SEROQUEL ) 50 MG tablet Take 1 tablet (50 mg total) by mouth 2 (two) times daily.   potassium chloride  (KLOR-CON ) 10 MEQ tablet Take 10 mEq by mouth 2 (two) times daily.   Dorzolamide  HCl-Timolol  Mal PF 2-0.5 % SOLN Place 1 drop into the left eye 2 (two) times daily.     ALLERGIES  Allergies  Allergen Reactions   Oxycodone -Acetaminophen  Rash    UNSPECIFIED REACTION     Tramadol Rash and Other (See Comments)    INTOLERANCE > Chest pain Other reaction(s): Other (See Comments) Chest pain    SOCIAL & SUBSTANCE USE HISTORY  Social History   Socioeconomic History   Marital status: Widowed    Spouse name: Not on file   Number of children: Not on file   Years of education: Not on file   Highest education level: Not on file  Occupational  History   Not on file  Tobacco Use   Smoking status: Former   Smokeless tobacco: Never  Vaping Use   Vaping status: Never Used  Substance and Sexual Activity   Alcohol use: Yes    Alcohol/week: 1.0 standard drink of alcohol    Types: 1 Glasses of wine per week   Drug use: No   Sexual activity: Not Currently  Other Topics Concern   Not on file  Social History Narrative   Not on file   Social Drivers of Health   Financial Resource Strain: Not on file  Food Insecurity: Not on file  Transportation Needs: No Transportation Needs (03/10/2023)   PRAPARE - Administrator, Civil Service (Medical): No    Lack of Transportation (Non-Medical): No  Physical Activity: Not on file  Stress: Not on file  Social Connections: Not on file   Social History   Tobacco Use  Smoking Status Former  Smokeless Tobacco Never   Social History   Substance and Sexual Activity  Alcohol Use Yes   Alcohol/week: 1.0 standard drink of alcohol   Types: 1 Glasses of wine per week   Social History    Substance and Sexual Activity  Drug Use No    Additional pertinent information: at group home.  FAMILY HISTORY  Family History  Problem Relation Age of Onset   Diabetes Mother    Family Psychiatric History (if known):  no  MENTAL STATUS EXAM (MSE)  Mental Status Exam: General Appearance: hospital attire; resting in bed  Orientation:  disoriented to year  Memory:  impaired  Concentration:  fair  Recall:  impaired  Attention  fair  Eye Contact:  poor  Speech:  low volume  Language:  fluent English  Volume:  soft volume  Mood: fine - patient  Affect:  constricted  Thought Process:  tangential  Thought Content:  poverty of thought  Suicidal Thoughts:  denies  Homicidal Thoughts:  denies  Judgement:  limited  Insight:  limited  Psychomotor Activity:  no PMA at this time  Akathisia:  none evident  Fund of Knowledge:  limited    \  VITALS  Blood pressure 114/76, pulse 61, temperature 98.7 F (37.1 C), temperature source Oral, resp. rate 16, height 5' 4 (1.626 m), SpO2 96%.  LABS  Admission on 01/27/2024  Component Date Value Ref Range Status   Sodium 01/27/2024 138  135 - 145 mmol/L Final   Potassium 01/27/2024 3.6  3.5 - 5.1 mmol/L Final   Chloride 01/27/2024 103  98 - 111 mmol/L Final   CO2 01/27/2024 25  22 - 32 mmol/L Final   Glucose, Bld 01/27/2024 104 (H)  70 - 99 mg/dL Final   Glucose reference range applies only to samples taken after fasting for at least 8 hours.   BUN 01/27/2024 12  8 - 23 mg/dL Final   Creatinine, Ser 01/27/2024 1.08 (H)  0.44 - 1.00 mg/dL Final   Calcium 92/94/7974 9.4  8.9 - 10.3 mg/dL Final   Total Protein 92/94/7974 8.4 (H)  6.5 - 8.1 g/dL Final   Albumin 92/94/7974 4.0  3.5 - 5.0 g/dL Final   AST 92/94/7974 23  15 - 41 U/L Final   ALT 01/27/2024 14  0 - 44 U/L Final   Alkaline Phosphatase 01/27/2024 73  38 - 126 U/L Final   Total Bilirubin 01/27/2024 0.6  0.0 - 1.2 mg/dL Final   GFR, Estimated 01/27/2024 53 (L)  >60 mL/min  Final  Comment: (NOTE) Calculated using the CKD-EPI Creatinine Equation (2021)    Anion gap 01/27/2024 10  5 - 15 Final   Performed at [facility], [address] [place], [place] [address]   Alcohol, Ethyl (B) 01/27/2024 <15  <15 mg/dL Final   Comment: (NOTE) For medical purposes only. Performed at [facility], [address] [place], [place] [address]    WBC 01/27/2024 6.1  4.0 - 10.5 K/uL Final   RBC 01/27/2024 4.84  3.87 - 5.11 MIL/uL Final   Hemoglobin 01/27/2024 13.5  12.0 - 15.0 g/dL Final   HCT 92/94/7974 43.3  36.0 - 46.0 % Final   MCV 01/27/2024 89.5  80.0 - 100.0 fL Final   MCH 01/27/2024 27.9  26.0 - 34.0 pg Final   MCHC 01/27/2024 31.2  30.0 - 36.0 g/dL Final   RDW 92/94/7974 14.6  11.5 - 15.5 % Final   Platelets 01/27/2024 342  150 - 400 K/uL Final   nRBC 01/27/2024 0.0  0.0 - 0.2 % Final   Performed at [facility], [address] [place], [place] [address]   Tricyclic, Ur Screen 01/27/2024 POSITIVE (A)  NONE DETECTED Final   Amphetamines, Ur Screen 01/27/2024 NONE DETECTED  NONE DETECTED Final   MDMA (Ecstasy)Ur Screen 01/27/2024 NONE DETECTED  NONE DETECTED Final   Cocaine Metabolite,Ur [place] 01/27/2024 NONE DETECTED  NONE DETECTED Final   Opiate, Ur Screen 01/27/2024 NONE DETECTED  NONE DETECTED Final   Phencyclidine (PCP) Ur S 01/27/2024 NONE DETECTED  NONE DETECTED Final   Cannabinoid 50 Ng, Ur  01/27/2024 NONE DETECTED  NONE DETECTED Final   Barbiturates, Ur Screen 01/27/2024 NONE DETECTED  NONE DETECTED Final   Benzodiazepine, Ur Scrn 01/27/2024 NONE DETECTED  NONE DETECTED Final   Methadone Scn, Ur 01/27/2024 NONE DETECTED  NONE DETECTED Final   Comment: (NOTE) Tricyclics + metabolites, urine    Cutoff 1000 ng/mL Amphetamines + metabolites, urine  Cutoff 1000 ng/mL MDMA (Ecstasy), urine              Cutoff 500 ng/mL Cocaine Metabolite, urine          Cutoff 300 ng/mL Opiate + metabolites, urine        Cutoff 300  ng/mL Phencyclidine (PCP), urine         Cutoff 25 ng/mL Cannabinoid, urine                 Cutoff 50 ng/mL Barbiturates + metabolites, urine  Cutoff 200 ng/mL Benzodiazepine, urine              Cutoff 200 ng/mL Methadone, urine                   Cutoff 300 ng/mL  The urine drug screen provides only a preliminary, unconfirmed analytical test result and should not be used for non-medical purposes. Clinical consideration and professional judgment should be applied to any positive drug screen result due to possible interfering substances. A more specific alternate chemical method must be used in order to obtain a confirmed analytical result. Gas chromatography / mass spectrometry (GC/MS) is the preferred confirm                          atory method. Performed at [facility], [address] [place], [place] [address]     PSYCHIATRIC REVIEW OF SYSTEMS (ROS)  ROS: Notable for the following relevant positive findings: ROS - agitation; difficulty concentrating  Additional  findings:      Musculoskeletal: No abnormal movements observed      Gait & Station: Laying/Sitting      Pain Screening: Denies  RISK FORMULATION/ASSESSMENT  Is the patient experiencing any suicidal or homicidal ideations: No  Protective factors considered for safety management: secure environment  Risk factors/concerns considered for safety management:  Depression Physical illness/chronic pain Age over 31  Is there a safety management plan with the patient and treatment team to minimize risk factors and promote protective factors: Yes           Explain: med adjustment Is crisis care placement or psychiatric hospitalization recommended: No     Based on my current evaluation and risk assessment, patient is determined at this time to be at:  Low risk  *RISK ASSESSMENT Risk assessment is a dynamic process; it is possible that this patient's condition, and risk level, may change. This should be  re-evaluated and managed over time as appropriate. Please re-consult psychiatric consult services if additional assistance is needed in terms of risk assessment and management. If your team decides to discharge this patient, please advise the patient how to best access emergency psychiatric services, or to call 911, if their condition worsens or they feel unsafe in any way.   Nancyann LITTIE Alert, MD Telepsychiatry Consult Services

## 2024-01-29 DIAGNOSIS — F4329 Adjustment disorder with other symptoms: Secondary | ICD-10-CM | POA: Diagnosis not present

## 2024-01-29 NOTE — ED Notes (Signed)
 IVC PAPERS  RESCINDED  PER  DR  JADAPALLE  MD

## 2024-01-29 NOTE — ED Provider Notes (Signed)
 BP 114/68 (BP Location: Left Arm)   Pulse (!) 57   Temp 98.5 F (36.9 C) (Oral)   Resp 17   Ht 5' 4 (1.626 m)   SpO2 95%   BMI 29.18 kg/m    Patient remained calm with no acute events overnight.  Had been cleared by telepsychiatry yesterday, recommended increasing doses of quetiapine  to 75 mg twice daily and mirtazapine  30 mg, changes made.  Tentative plan for discharge back to her group facility later this morning assuming patient has no further aggressive behavior.     Clarine Ozell LABOR, MD 01/29/24 0600

## 2024-01-29 NOTE — ED Notes (Signed)
 This Clinical research associate spoke with Penny Hart at [facility](legal guardian) about pending discharge to [facility]

## 2024-01-29 NOTE — ED Provider Notes (Signed)
 No issues with her behavior today throughout my shift.  Again evaluated by psychiatry who is cleared the patient for discharge back to her group home.   Claudene Rover, MD 01/29/24 1539

## 2024-01-29 NOTE — ED Notes (Signed)
 [facility] here to pick up pt.  D/c inst to [facility] mr clemmons

## 2024-01-29 NOTE — Progress Notes (Signed)
   01/29/24 1245  Spiritual Encounters  Type of Visit Initial  Care provided to: Pt and family  Conversation partners present during encounter Nurse  Reason for visit Routine spiritual support  OnCall Visit No   Chaplain greeted patient and family as she was rounding on the Unit.    Rev. Rana M. Nicholaus, M.Div. Chaplain Resident [facility]
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
## 2024-01-29 NOTE — ED Notes (Signed)
 This Clinical research associate has called [facility] several times for patient's discharge.   No one answering the phone.  Charge nurse heather rn aware. Called again just now and no answer.

## 2024-01-29 NOTE — ED Notes (Signed)
 PT  VOL

## 2024-01-29 NOTE — ED Notes (Signed)
Ivc/ psych consult complete 

## 2024-01-29 NOTE — ED Notes (Signed)
Family visiting with pt

## 2024-02-14 ENCOUNTER — Encounter: Payer: Self-pay | Admitting: Emergency Medicine

## 2024-02-14 ENCOUNTER — Emergency Department
Admission: EM | Admit: 2024-02-14 | Discharge: 2024-02-14 | Disposition: A | Discharge status: Home / Self Care | Attending: Emergency Medicine | Admitting: Emergency Medicine

## 2024-02-14 ENCOUNTER — Emergency Department

## 2024-02-14 DIAGNOSIS — R451 Restlessness and agitation: Secondary | ICD-10-CM | POA: Diagnosis present

## 2024-02-14 DIAGNOSIS — F0390 Unspecified dementia without behavioral disturbance: Secondary | ICD-10-CM | POA: Insufficient documentation

## 2024-02-14 LAB — COMPREHENSIVE METABOLIC PANEL WITH GFR
ALT: 23 U/L (ref 0–44)
AST: 27 U/L (ref 15–41)
Albumin: 3.6 g/dL (ref 3.5–5.0)
Alkaline Phosphatase: 68 U/L (ref 38–126)
Anion gap: 12 (ref 5–15)
BUN: 14 mg/dL (ref 8–23)
CO2: 24 mmol/L (ref 22–32)
Calcium: 9.5 mg/dL (ref 8.9–10.3)
Chloride: 103 mmol/L (ref 98–111)
Creatinine, Ser: 1.09 mg/dL — ABNORMAL HIGH (ref 0.44–1.00)
GFR, Estimated: 53 mL/min — ABNORMAL LOW (ref >60)
Glucose, Bld: 101 mg/dL — ABNORMAL HIGH (ref 70–99)
Potassium: 3.8 mmol/L (ref 3.5–5.1)
Sodium: 139 mmol/L (ref 135–145)
Total Bilirubin: 0.6 mg/dL (ref 0.0–1.2)
Total Protein: 7.3 g/dL (ref 6.5–8.1)

## 2024-02-14 LAB — CBC
HCT: 42 % (ref 36.0–46.0)
Hemoglobin: 13.3 g/dL (ref 12.0–15.0)
MCH: 28.7 pg (ref 26.0–34.0)
MCHC: 31.7 g/dL (ref 30.0–36.0)
MCV: 90.5 fL (ref 80.0–100.0)
Platelets: 278 K/uL (ref 150–400)
RBC: 4.64 MIL/uL (ref 3.87–5.11)
RDW: 14.7 % (ref 11.5–15.5)
WBC: 7.1 K/uL (ref 4.0–10.5)
nRBC: 0 % (ref 0.0–0.2)

## 2024-02-14 LAB — ETHANOL: Alcohol, Ethyl (B): <15 mg/dL (ref <15)

## 2024-02-14 NOTE — ED Notes (Signed)
 Pt sleeping comfortably in ED stretcher. Pt ABCs intact. RR even and unlabored. Pt in NAD. Bed in lowest locked position. Call bell in reach.
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
## 2024-02-14 NOTE — Discharge Instructions (Addendum)
 You were seen in the emergency department following an episode of agitation.  You are calm and cooperative while in the emergency department.  Your vital signs were stable.  Your lab work was normal.  You had a CT scan of your head that did not show any abnormalities.  You had a chest x-ray that did not show any abnormalities.  It is importantly continue to follow-up as an outpatient with your primary care physician and return to the emergency department for any worsening symptoms.

## 2024-02-14 NOTE — ED Notes (Signed)
 Pt ABCs intact. RR even and unlabored. Pt in NAD. Bed in lowest locked position. Call bell in reach. Denies needs at this time.

## 2024-02-14 NOTE — ED Notes (Signed)
 [facility] called back to arrange transportation

## 2024-02-14 NOTE — ED Notes (Signed)
 Attempted to call [facility] x2. No answer.

## 2024-02-14 NOTE — ED Notes (Signed)
 Pt back from CT at this time

## 2024-02-14 NOTE — ED Notes (Signed)
 This RN attempted to call [facility], to give report on pt. No staff member answered the phone at this time. Will try again in a few minutes.

## 2024-02-18 ENCOUNTER — Other Ambulatory Visit: Payer: Self-pay

## 2024-02-18 ENCOUNTER — Emergency Department: Admission: EM | Admit: 2024-02-18 | Discharge: 2024-03-05 | Disposition: A | Discharge status: Skilled Nursing Facility

## 2024-02-18 DIAGNOSIS — I110 Hypertensive heart disease with heart failure: Secondary | ICD-10-CM | POA: Diagnosis not present

## 2024-02-18 DIAGNOSIS — J449 Chronic obstructive pulmonary disease, unspecified: Secondary | ICD-10-CM | POA: Insufficient documentation

## 2024-02-18 DIAGNOSIS — I2510 Atherosclerotic heart disease of native coronary artery without angina pectoris: Secondary | ICD-10-CM | POA: Diagnosis not present

## 2024-02-18 DIAGNOSIS — R45850 Homicidal ideations: Secondary | ICD-10-CM | POA: Diagnosis not present

## 2024-02-18 DIAGNOSIS — R456 Violent behavior: Secondary | ICD-10-CM | POA: Diagnosis not present

## 2024-02-18 DIAGNOSIS — I509 Heart failure, unspecified: Secondary | ICD-10-CM | POA: Insufficient documentation

## 2024-02-18 DIAGNOSIS — E119 Type 2 diabetes mellitus without complications: Secondary | ICD-10-CM | POA: Insufficient documentation

## 2024-02-18 DIAGNOSIS — R451 Restlessness and agitation: Secondary | ICD-10-CM | POA: Diagnosis present

## 2024-02-18 DIAGNOSIS — F03918 Unspecified dementia, unspecified severity, with other behavioral disturbance: Secondary | ICD-10-CM | POA: Diagnosis not present

## 2024-02-18 DIAGNOSIS — F0390 Unspecified dementia without behavioral disturbance: Secondary | ICD-10-CM | POA: Insufficient documentation

## 2024-02-18 LAB — CBC
HCT: 40.1 % (ref 36.0–46.0)
Hemoglobin: 13.1 g/dL (ref 12.0–15.0)
MCH: 28.7 pg (ref 26.0–34.0)
MCHC: 32.7 g/dL (ref 30.0–36.0)
MCV: 87.9 fL (ref 80.0–100.0)
Platelets: 284 K/uL (ref 150–400)
RBC: 4.56 MIL/uL (ref 3.87–5.11)
RDW: 14.9 % (ref 11.5–15.5)
WBC: 5.1 K/uL (ref 4.0–10.5)
nRBC: 0 % (ref 0.0–0.2)

## 2024-02-18 LAB — URINE DRUG SCREEN, QUALITATIVE (ARMC ONLY)
Amphetamines, Ur Screen: NOT DETECTED
Barbiturates, Ur Screen: NOT DETECTED
Benzodiazepine, Ur Scrn: NOT DETECTED
Cannabinoid 50 Ng, Ur ~~LOC~~: NOT DETECTED
Cocaine Metabolite,Ur ~~LOC~~: NOT DETECTED
MDMA (Ecstasy)Ur Screen: NOT DETECTED
Methadone Scn, Ur: NOT DETECTED
Opiate, Ur Screen: NOT DETECTED
Phencyclidine (PCP) Ur S: NOT DETECTED
Tricyclic, Ur Screen: POSITIVE — AB

## 2024-02-18 LAB — COMPREHENSIVE METABOLIC PANEL WITH GFR
ALT: 19 U/L (ref 0–44)
AST: 24 U/L (ref 15–41)
Albumin: 3.7 g/dL (ref 3.5–5.0)
Alkaline Phosphatase: 73 U/L (ref 38–126)
Anion gap: 11 (ref 5–15)
BUN: 14 mg/dL (ref 8–23)
CO2: 24 mmol/L (ref 22–32)
Calcium: 9.5 mg/dL (ref 8.9–10.3)
Chloride: 105 mmol/L (ref 98–111)
Creatinine, Ser: 0.89 mg/dL (ref 0.44–1.00)
GFR, Estimated: >60 mL/min (ref >60)
Glucose, Bld: 113 mg/dL — ABNORMAL HIGH (ref 70–99)
Potassium: 3.7 mmol/L (ref 3.5–5.1)
Sodium: 140 mmol/L (ref 135–145)
Total Bilirubin: 0.6 mg/dL (ref 0.0–1.2)
Total Protein: 7.7 g/dL (ref 6.5–8.1)

## 2024-02-18 LAB — URINALYSIS, COMPLETE (UACMP) WITH MICROSCOPIC
Bacteria, UA: NONE SEEN
Bilirubin Urine: NEGATIVE
Glucose, UA: NEGATIVE mg/dL
Hgb urine dipstick: NEGATIVE
Ketones, ur: NEGATIVE mg/dL
Leukocytes,Ua: NEGATIVE
Nitrite: NEGATIVE
Protein, ur: NEGATIVE mg/dL
Specific Gravity, Urine: 1.004 — ABNORMAL LOW (ref 1.005–1.030)
pH: 5 (ref 5.0–8.0)

## 2024-02-18 LAB — ETHANOL: Alcohol, Ethyl (B): <15 mg/dL (ref <15)

## 2024-02-18 MED ORDER — LAMOTRIGINE 25 MG PO TABS
50.0000 mg | ORAL_TABLET | Freq: Two times a day (BID) | ORAL | Status: DC
  Administered 2024-02-18 – 2024-02-27 (×18): 50 mg via ORAL
  Filled 2024-02-18 (×18): qty 2

## 2024-02-18 MED ORDER — MIRTAZAPINE 15 MG PO TABS
30.0000 mg | ORAL_TABLET | Freq: Every day | ORAL | Status: DC
  Administered 2024-02-18 – 2024-03-04 (×17): 30 mg via ORAL
  Filled 2024-02-18 (×17): qty 2

## 2024-02-18 MED ORDER — TORSEMIDE 20 MG PO TABS
20.0000 mg | ORAL_TABLET | Freq: Every day | ORAL | Status: DC
  Administered 2024-02-18 – 2024-03-05 (×19): 20 mg via ORAL
  Filled 2024-02-18 (×17): qty 1

## 2024-02-18 MED ORDER — LATANOPROST 0.005 % OP SOLN
1.0000 [drp] | Freq: Every day | OPHTHALMIC | Status: DC
  Administered 2024-02-18 – 2024-03-04 (×17): 1 [drp] via OPHTHALMIC
  Filled 2024-02-18 (×2): qty 2.5

## 2024-02-18 MED ORDER — METOPROLOL SUCCINATE ER 25 MG PO TB24
25.0000 mg | ORAL_TABLET | Freq: Every day | ORAL | Status: DC
  Administered 2024-02-18 – 2024-03-04 (×14): 25 mg via ORAL
  Filled 2024-02-18 (×17): qty 1

## 2024-02-18 MED ORDER — DORZOLAMIDE HCL-TIMOLOL MAL 2-0.5 % OP SOLN
1.0000 [drp] | Freq: Two times a day (BID) | OPHTHALMIC | Status: DC
  Administered 2024-02-18 – 2024-03-05 (×34): 1 [drp] via OPHTHALMIC
  Filled 2024-02-18: qty 10

## 2024-02-18 MED ORDER — ISOSORBIDE MONONITRATE ER 60 MG PO TB24
30.0000 mg | ORAL_TABLET | Freq: Every day | ORAL | Status: DC
  Administered 2024-02-18 – 2024-03-05 (×17): 30 mg via ORAL
  Filled 2024-02-18 (×14): qty 1

## 2024-02-18 MED ORDER — POTASSIUM CHLORIDE CRYS ER 20 MEQ PO TBCR
10.0000 meq | EXTENDED_RELEASE_TABLET | Freq: Two times a day (BID) | ORAL | Status: DC
  Administered 2024-02-18 – 2024-02-21 (×8): 10 meq via ORAL
  Filled 2024-02-18 (×8): qty 1

## 2024-02-18 MED ORDER — MIRTAZAPINE 15 MG PO TABS: 15.0000 mg | ORAL_TABLET | Freq: Every day | ORAL | Status: DC

## 2024-02-18 MED ORDER — PRAVASTATIN SODIUM 20 MG PO TABS
10.0000 mg | ORAL_TABLET | Freq: Every evening | ORAL | Status: DC
  Administered 2024-02-18 – 2024-03-04 (×17): 10 mg via ORAL
  Filled 2024-02-18 (×16): qty 1
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
## 2024-02-18 NOTE — ED Notes (Signed)
 Pt ambulatory to BR in the room

## 2024-02-18 NOTE — ED Notes (Signed)
 Patient ambulatory to toilet w/ standby assist.

## 2024-02-18 NOTE — ED Notes (Signed)
Pt ambulated to the bathroom at this time.
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
## 2024-02-18 NOTE — ED Notes (Signed)
 Ambulated pt to the bathroom at this time. Gait steady. Stand by assistance provided.

## 2024-02-18 NOTE — ED Notes (Signed)
 Waste discarded.
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
## 2024-02-18 NOTE — ED Notes (Signed)
Dinner tray received.

## 2024-02-19 DIAGNOSIS — F03918 Unspecified dementia, unspecified severity, with other behavioral disturbance: Secondary | ICD-10-CM

## 2024-02-19 NOTE — ED Notes (Signed)
 INVOLUNTARY and continues to await TOC reassessment and plan

## 2024-02-19 NOTE — ED Notes (Signed)
 IVC/Consult Completed/Rec:Overnight observation with D/C this Am

## 2024-02-19 NOTE — ED Notes (Addendum)
 Fall risk bundle is currently in place.

## 2024-02-19 NOTE — ED Notes (Signed)
 Bed Alarm went off pt was attempting to get out of the bed to use the bathroom. Assisted pt to the toilet and stayed in the room until she was finished. Pt is back in the back with the bed alarm on.

## 2024-02-19 NOTE — Consult Note (Signed)
 [facility] Psychiatric Consult Initial  Patient Name: .Penny Hart  MRN: [id number]  DOB: 1946/06/02  Consult Order details:  Orders (From admission, onward)     Start     Ordered   02/18/24 1445  IP CONSULT TO PSYCHIATRY       Ordering Provider: Nicholaus Rolland BRAVO, MD  Provider:  (Not yet assigned)  Question Answer Comment  Place call to: [phone]   Reason for Consult Admit   Diagnosis/Clinical Info for Consult: Geriatric psych, SI/HI, likely worsening dementia      02/18/24 1444   02/18/24 1220  CONSULT TO CALL ACT TEAM       Ordering Provider: Nicholaus Rolland BRAVO, MD  Provider:  (Not yet assigned)  Question:  Reason for Consult?  Answer:  Psych consult   02/18/24 1220             Mode of Visit: In person    Psychiatry Consult Evaluation  Service Date: February 19, 2024 LOS:  LOS: 0 days  Chief Complaint aggressive behaviors  Primary Psychiatric Diagnoses  dementia   Assessment  Penny Hart is a 78 y.o. female admitted: Presented to the ED   Penny Hart is a 78 y.o. female  CAD, COPD, type 2 diabetes, hypertension, diverticulosis, and dementia who presents with apparent aggressive behavior at her facility.  She relies on an IVC initiated by police.  Per report, patient was experiencing delusions of grandour stating that she owned the entire skilled nursing facility and when this was disagreed with threatened to kill other residents and herself.  She cannot be verbally redirected and she was subsequently sent here.    02/19/2024: Patient is noted to be calm and cooperative with no behavioral disturbance in the ED.  She is not displaying any unsafe behaviors and denies SI/HI/plan and is not responding to internal stimuli.  No indication of inpatient psychiatric admission or IVC at this time.  Patient is psychiatrically cleared for discharge.   Diagnoses:  Active Hospital problems: Active Problems:   * No active hospital problems. *    Plan   ## Psychiatric  Medication Recommendations:  Continue current medications from nursing home  ## Medical Decision Making Capacity: Patient has a guardian and has thus been adjudicated incompetent; please involve patients guardian in medical decision making  ## Further Work-up:   -- most recent EKG on 02/16/2024 had QtC of 422 -- Pertinent labwork reviewed earlier this admission includes: CBC, CMP   ## Disposition:--No psychiatric contraindication for discharge  ## Behavioral / Environmental: -Utilize compassion and acknowledge the patient's experiences while setting clear and realistic expectations for care.    ## Safety and Observation Level:  - Based on my clinical evaluation, I estimate the patient to be at low risk of self harm in the current setting. - At this time, we recommend  routine. This decision is based on my review of the chart including patient's history and current presentation, interview of the patient, mental status examination, and consideration of suicide risk including evaluating suicidal ideation, plan, intent, suicidal or self-harm behaviors, risk factors, and protective factors. This judgment is based on our ability to directly address suicide risk, implement suicide prevention strategies, and develop a safety plan while the patient is in the clinical setting. Please contact our team if there is a concern that risk level has changed.  CSSR Risk Category:C-SSRS RISK CATEGORY: No Risk  Suicide Risk Assessment: Patient has following modifiable risk factors for suicide: There is no  risk of suicide Patient has following non-modifiable or demographic risk factors for suicide: no identified Patient has the following protective factors against suicide: Supportive family  Thank you for this consult request. Recommendations have been communicated to the primary team.  We will recommend overnight observation at this time.   Allyn Foil, MD       History of Present Illness  Relevant  Aspects of Hospital ED   02/19/2024: Patient is noted to be resting in bed and is calm and cooperative.  She was oriented to self.  She was unable to answer any other orientation questions including the location, the reason for the visit and her current living situations.  She denies feeling depressed or anxious and denies any auditory/visual hallucinations.  She denies suicidal/homicidal ideation/plan.  She remains confused and reports that she is still able to cook for herself and clean her home.  She reports living at home with her family but per chart patient is coming from her facility.   Psychiatric and Social History  Psychiatric History:  Information collected from patient and chart  Prev Dx/Sx: denies Current Psych Provider: denies Home Meds (current): See chart Previous Med Trials: denies Therapy: denies  Prior Psych Hospitalization: denies  Prior Self Harm: denies Prior Violence: denies  Family Psych History: denies Family Hx suicide: denies  Social History:   Educational Hx: unknown Occupational Hx: retired Armed forces operational officer Hx: unknown Living Situation: SNF Spiritual Hx: unknown Access to weapons/lethal means: none   Substance History Alcohol: unknown  Type of alcohol unknown Last Drink unknown Number of drinks per day unknown History of alcohol withdrawal seizures unknown History of DT's unknown Tobacco: unknown Illicit drugs: unknown Prescription drug abuse: unknown Rehab hx: unknown  Exam Findings  Physical Exam: I agree with the initial exam Vital Signs:  Temp:  [98 F (36.7 C)-98.1 F (36.7 C)] 98 F (36.7 C) (07/28 2122) Pulse Rate:  [55-57] 57 (07/28 2122) Resp:  [15-16] 15 (07/28 2122) BP: (115-117)/(61-68) 117/68 (07/28 2122) SpO2:  [94 %-99 %] 94 % (07/28 2122) Blood pressure 117/68, pulse (!) 57, temperature 98 F (36.7 C), temperature source Oral, resp. rate 15, height 5' 4 (1.626 m), weight 77.1 kg, SpO2 94%. Body mass index is 29.18  kg/m.    Mental Status Exam: General Appearance: Neat  Orientation:  Other:  A&Ox1  Memory:  Immediate;   Poor Recent;   Poor Remote;   Poor  Concentration:  Concentration: Poor and Attention Span: Poor  Recall:  Poor  Attention  Fair  Eye Contact:  Good  Speech:  Normal Rate  Language:  Good  Volume:  Normal  Mood: euthmyic  Affect:  Restricted  Thought Process:  Coherent  Thought Content:  Negative  Suicidal Thoughts:  No  Homicidal Thoughts:  No  Judgement:  Impaired  Insight:  impaired  Psychomotor Activity:  Normal  Akathisia:  No  Fund of Knowledge:  Poor      Assets:  Communication Skills  Cognition:  Impaired,  Moderate  ADL's:  Impaired  AIMS (if indicated):        Other History   These have been pulled in through the EMR, reviewed, and updated if appropriate.  Family History:  The patient's family history includes Diabetes in her mother.  Medical History: Past Medical History:  Diagnosis Date   Arthritis    CHF (congestive heart failure) (HCC)    COPD (chronic obstructive pulmonary disease) (HCC)    Coronary artery disease    Dementia (HCC)  Diabetes mellitus without complication (HCC)    GERD (gastroesophageal reflux disease)    Glaucoma    Headache    Hiatal hernia    Hyperlipidemia    Hypertension    Neuropathy    Obesity    Peripheral neuropathy    Pseudophakia    Sleep apnea    Spinal stenosis    Spondylolisthesis of lumbar region    Thoracic aortic atherosclerosis [facility])     Surgical History: Past Surgical History:  Procedure Laterality Date   ABDOMINAL HYSTERECTOMY     BACK SURGERY  2019   [facility] [place]   BREAST EXCISIONAL BIOPSY Left yrs ago    benign   CATARACT EXTRACTION W/ INTRAOCULAR LENS  IMPLANT, BILATERAL     COLONOSCOPY N/A 06/01/2020   Procedure: COLONOSCOPY;  Surgeon: Maryruth Ole DASEN, MD;  Location: [facility] ENDOSCOPY;  Service: Endoscopy;  Laterality: N/A;   COLONOSCOPY WITH PROPOFOL  N/A 05/30/2015    Procedure: COLONOSCOPY WITH PROPOFOL ;  Surgeon: Rogelia Copping, MD;  Location: [facility] ENDOSCOPY;  Service: Endoscopy;  Laterality: N/A;   ESOPHAGOGASTRODUODENOSCOPY N/A 06/01/2020   Procedure: ESOPHAGOGASTRODUODENOSCOPY (EGD);  Surgeon: Maryruth Ole DASEN, MD;  Location: [facility] ENDOSCOPY;  Service: Endoscopy;  Laterality: N/A;   ESOPHAGOGASTRODUODENOSCOPY (EGD) WITH PROPOFOL  N/A 05/30/2015   Procedure: ESOPHAGOGASTRODUODENOSCOPY (EGD) WITH PROPOFOL ;  Surgeon: Rogelia Copping, MD;  Location: [facility] ENDOSCOPY;  Service: Endoscopy;  Laterality: N/A;   EYE SURGERY     Bialteral for glaucoma   Eye surgery for glaucoma     JOINT REPLACEMENT     Left   LEFT HEART CATH AND CORONARY ANGIOGRAPHY Left 01/30/2020   Procedure: LEFT HEART CATH AND CORONARY ANGIOGRAPHY;  Surgeon: Florencio Cara BIRCH, MD;  Location: [facility] INVASIVE CV LAB;  Service: Cardiovascular;  Laterality: Left;   Left total knee replacement     OOPHORECTOMY     PELVIC LAPAROSCOPY     POSTERIOR LUMBAR FUSION  04/27/2017   TUBAL LIGATION       Medications:   Current Facility-Administered Medications:    dorzolamide -timolol  (COSOPT ) 2-0.5 % ophthalmic solution 1 drop, 1 drop, Left Eye, BID, Nicholaus Maize E, MD, 1 drop at 02/19/24 2113   isosorbide  mononitrate (IMDUR ) 24 hr tablet 30 mg, 30 mg, Oral, Daily, Nicholaus Maize E, MD, 30 mg at 02/19/24 1137   lamoTRIgine  (LAMICTAL ) tablet 50 mg, 50 mg, Oral, BID, Nicholaus Maize E, MD, 50 mg at 02/19/24 2112   latanoprost  (XALATAN ) 0.005 % ophthalmic solution 1 drop, 1 drop, Both Eyes, QHS, Davis, Hillary E, MD, 1 drop at 02/19/24 2222   metoprolol  succinate (TOPROL -XL) 24 hr tablet 25 mg, 25 mg, Oral, Daily, Nicholaus Maize E, MD, 25 mg at 02/19/24 1137   mirtazapine  (REMERON ) tablet 30 mg, 30 mg, Oral, QHS, Nicholaus Maize E, MD, 30 mg at 02/19/24 2112   potassium chloride  SA (KLOR-CON  M) CR tablet 10 mEq, 10 mEq, Oral, BID, Nicholaus Maize E, MD, 10 mEq at 02/19/24 2113   pravastatin  (PRAVACHOL ) tablet 10  mg, 10 mg, Oral, QPM, Nicholaus Maize E, MD, 10 mg at 02/19/24 1707   torsemide  (DEMADEX ) tablet 20 mg, 20 mg, Oral, Daily, Nicholaus Maize E, MD, 20 mg at 02/19/24 1137  Current Outpatient Medications:    aspirin  EC 81 MG tablet, Take 1 tablet (81 mg total) by mouth daily. Swallow whole., Disp: 30 tablet, Rfl: 2   Dorzolamide  HCl-Timolol  Mal PF 2-0.5 % SOLN, Place 1 drop into the left eye 2 (two) times daily., Disp: , Rfl:    isosorbide  mononitrate (  IMDUR ) 30 MG 24 hr tablet, Take 1 tablet (30 mg total) by mouth daily., Disp: 30 tablet, Rfl: 2   lamoTRIgine  (LAMICTAL ) 25 MG tablet, Take 2 tablets (50 mg total) by mouth 2 (two) times daily. (Patient taking differently: Take 75 mg by mouth 2 (two) times daily.), Disp: 120 tablet, Rfl: 2   latanoprost  (XALATAN ) 0.005 % ophthalmic solution, Place 1 drop into both eyes at bedtime., Disp: 1.5 mL, Rfl: 11   LORazepam  (ATIVAN ) 0.5 MG tablet, Take 0.5 mg by mouth every 6 (six) hours as needed for anxiety., Disp: , Rfl:    metoprolol  succinate (TOPROL -XL) 25 MG 24 hr tablet, Take 1 tablet (25 mg total) by mouth daily., Disp: 30 tablet, Rfl: 2   mirtazapine  (REMERON ) 15 MG tablet, Take 1 tablet (15 mg total) by mouth at bedtime. (Patient taking differently: Take 30 mg by mouth at bedtime.), Disp: 30 tablet, Rfl: 0   potassium chloride  (KLOR-CON  M) 10 MEQ tablet, Take 1 tablet (10 mEq total) by mouth 2 (two) times daily., Disp: 60 tablet, Rfl: 2   pravastatin  (PRAVACHOL ) 10 MG tablet, Take 1 tablet (10 mg total) by mouth every evening., Disp: 30 tablet, Rfl: 2   QUEtiapine  (SEROQUEL ) 25 MG tablet, Take 1 tablet (25 mg total) by mouth daily in the afternoon. 3 pm, Disp: 30 tablet, Rfl: 1   QUEtiapine  (SEROQUEL ) 50 MG tablet, Take 1 tablet (50 mg total) by mouth 2 (two) times daily., Disp: 60 tablet, Rfl: 1   torsemide  (DEMADEX ) 20 MG tablet, Take 1 tablet (20 mg total) by mouth daily., Disp: 30 tablet, Rfl: 2   potassium chloride  (KLOR-CON ) 10 MEQ tablet, Take 10  mEq by mouth 2 (two) times daily., Disp: , Rfl:   Allergies: Allergies  Allergen Reactions   Oxycodone -Acetaminophen  Rash    UNSPECIFIED REACTION     Tramadol Rash and Other (See Comments)    INTOLERANCE > Chest pain Other reaction(s): Other (See Comments) Chest pain    Allyn Foil, MD

## 2024-02-19 NOTE — ED Notes (Signed)
 Assumed care of pt. Report received from previous RN. Pt alert and oriented. Pt RR even and unlabored. Pt denies pain at this time. Pt calm and cooperative. Pt denies any needs at this time.

## 2024-02-19 NOTE — TOC CM/SW Note (Signed)
..  Transition of Care [facility]) - Inpatient Brief Assessment   Patient Details  Name: Penny Hart MRN: [id number] Date of Birth: 1946/05/24  Transition of [facility]) CM/SW Contact:    [name], LCSW Phone Number: 02/19/2024, 12:03 PM   Clinical Narrative:  SW met with DSS APS- [name] regarding pt.  [name] stated that pt is currently residing at  [facility] (ALF) however facility is not able to provide the care that pt needs.  Pt has dementia and behaviors that facility staff is not trained to handed.  ALF IVC pt.  DSS is pt guardian.  Pt also is having troubling seeing.  [name] would like pt placed in SNF.  SW expressed to  APS that pt medicaid will need to be changed to [facility].  SW asked if APS wants pt to return to facility?  [name] will update SW when she receives update from [employer].  SW expressed to APS that pt will need to see eye doctor and that maybe Doll therapy will be appropriate for pt.  APS stated that pt believes that she was pregnant and that someone took her baby.  Transition of Care Asessment:

## 2024-02-19 NOTE — ED Notes (Signed)
 Pt ambulatory to restroom at this time with 1 person assist.

## 2024-02-19 NOTE — ED Notes (Signed)
 [facility] spoke to [employer] Location manager) at [employer].  [facility] inquired if Penny Hart would be able to return to facility once psych cleared for discharge.  Penny Hart stated yes, Penny Hart can return to [facility] once discharged.   Penny Hart reported that the police states Penny Hart is a threat to other residents at this location.  Penny Hart resides on the [address] memory care unit.  Due to Penny Hart's history with displaying aggressive behaviors toward residents, Penny Hart believes Penny Hart needs a higher level of care.  Penny Hart states Penny Hart is unpredictable with the level of aggression ( ex: hitting residents with cane, spitting on residents, etc) and this creates an unsafe environment for other residents on the unit.  Penny Hart will be working with DSS to come up with a plan to find a future placement for Penny Hart.  [place], [facility] [phone]

## 2024-02-19 NOTE — ED Notes (Signed)
 Fall risk bundle is currently in place.

## 2024-02-19 NOTE — ED Provider Notes (Signed)
 [place] DSS representative stop by the ER and advises that they are now the patient's guardian.  TOC team coordinating for disposition options   Dicky Anes, MD 02/19/24 1257

## 2024-02-19 NOTE — Evaluation (Signed)
 Occupational Therapy Evaluation Patient Details Name: Penny Hart MRN: [id number] DOB: [birth year] Today's Date: 02/19/2024   History of Present Illness   Pt is a 78 y.o. female with PMH that includes: CAD, COPD, type 2 diabetes, hypertension, diverticulosis, and dementia who presents with apparent aggressive behavior at her facility.  She relies on an IVC initiated by police.  Per report, patient was experiencing delusions of grandour stating that she owned the entire skilled nursing facility and when this was disagreed with threatened to kill other residents and herself.  MD assessment inlcudes homicidal ideation, dementia, and agitation.     Clinical Impressions Pt seen for OT evaluation. Pt is known to this author from previous admissions. Pt pleasantly confused, wanting to know where her baby is, and is cooperative throughout session. Pt completes bed mobility with supervision for safety, functional mobility to / from bathroom in ED room with +1 HHA, requires verbal cues with occasional hand over hand to compensate for visual deficits to locate items such as grab bars, toilet paper and hand soap. MIN A for transfer to lower commode, and CGA for dynamic standing balance to wash hands. Pt requires up to MIN A for UB/LB dressing, able to don socks using seated figure four and doff pants over hips in standing with no UE support. Pt would benefit from skilled OT services to address noted impairments and functional limitations (see below for any additional details) in order to maximize safety and independence while minimizing falls risk and caregiver burden. Anticipate the need for follow up OT services upon acute hospital DC, pt will require 24/7 assist due to vision and cognitive deficits.      If plan is discharge home, recommend the following:   A little help with walking and/or transfers;A little help with bathing/dressing/bathroom;Assistance with feeding;Assistance with  cooking/housework;Direct supervision/assist for medications management;Direct supervision/assist for financial management;Assist for transportation;Help with stairs or ramp for entrance;Supervision due to cognitive status     Functional Status Assessment   Patient has had a recent decline in their functional status and demonstrates the ability to make significant improvements in function in a reasonable and predictable amount of time.     Equipment Recommendations   None recommended by OT      Precautions/Restrictions   Precautions Precautions: Fall Restrictions Weight Bearing Restrictions Per Provider Order: No     Mobility Bed Mobility Overal bed mobility: Independent             General bed mobility comments: easily exits/enters hospital gurney    Transfers Overall transfer level: Needs assistance Equipment used: None Transfers: Sit to/from Stand Sit to Stand: Contact guard assist, Supervision                  Balance Overall balance assessment: Needs assistance Sitting-balance support: No upper extremity supported, Feet supported       Standing balance support: No upper extremity supported Standing balance-Leahy Scale: Fair Standing balance comment: CGA for dynamic balance tasks for safety                           ADL either performed or assessed with clinical judgement   ADL Overall ADL's : Needs assistance/impaired     Grooming: Wash/dry hands;Wash/dry face;Standing;Minimal assistance;Contact guard assist;Cueing for sequencing Grooming Details (indicate cue type and reason): sink level, verbal cues to locate items due to poor vision, CGA for standing balance, no LOB     Lower Body Bathing: Sit  to/from stand;Minimal assistance Lower Body Bathing Details (indicate cue type and reason): cues for safety Upper Body Dressing : Minimal assistance;Sitting Upper Body Dressing Details (indicate cue type and reason): minA to orient shirt  2/2 vision deficits Lower Body Dressing: Maximal assistance;Sit to/from stand Lower Body Dressing Details (indicate cue type and reason): minA to orient pants and thread feet through, doffs/dons socks supervision using seated figure four Toilet Transfer: Minimal assistance;Ambulation;Grab bars Toilet Transfer Details (indicate cue type and reason): t/f standard commode in room with +1 HHA, cues and hand over hand to locate grab bar Toileting- Clothing Manipulation and Hygiene: Minimal assistance;Sit to/from stand       Functional mobility during ADLs: Contact guard assist;Cueing for sequencing       Vision Baseline Vision/History: 3 Glaucoma (keeps R eye closed, very poor functional vision) Ability to See in Adequate Light: 3 Highly impaired Patient Visual Report: No change from baseline Additional Comments: pt generally able to distinguish large objects (people, commode) in well-lit room, but often inconsistently            Pertinent Vitals/Pain Pain Assessment Pain Assessment: No/denies pain     Extremity/Trunk Assessment Upper Extremity Assessment Upper Extremity Assessment: Overall WFL for tasks assessed   Lower Extremity Assessment Lower Extremity Assessment: Overall WFL for tasks assessed       Communication Communication Communication: No apparent difficulties   Cognition Arousal: Alert Behavior During Therapy: WFL for tasks assessed/performed Cognition: History of cognitive impairments             OT - Cognition Comments: pt pleasant and cooperative, very grateful to have assist with ADLs and clean clothes                 Following commands: Impaired Following commands impaired: Follows one step commands with increased time     Cueing  General Comments   Cueing Techniques: Verbal cues;Tactile cues;Visual cues              Home Living Family/patient expects to be discharged to:: Assisted living                              Home Equipment: [place] - single point   Additional Comments: from [facility] memory care unit      Prior Functioning/Environment Prior Level of Function : Patient poor historian/Family not available             Mobility Comments: unreliable historian, pt used RW last admission ADLs Comments: 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; anticipate pt required physical + cognitive assist for ADLs 2/2 vision deficits and dementia    OT Problem List: Decreased strength;Impaired balance (sitting and/or standing);Impaired vision/perception;Decreased coordination;Decreased cognition;Decreased safety awareness;Decreased knowledge of use of DME or AE;Decreased knowledge of precautions   OT Treatment/Interventions: Self-care/ADL training;Therapeutic exercise;Energy conservation;DME and/or AE instruction;Therapeutic activities;Cognitive remediation/compensation;Visual/perceptual remediation/compensation;Patient/family education;Balance training      OT Goals(Current goals can be found in the care plan section)   Acute Rehab OT Goals OT Goal Formulation: Patient unable to participate in goal setting Time For Goal Achievement: 03/04/24 Potential to Achieve Goals: Fair   OT Frequency:  Min 2X/week    Co-evaluation              AM-PAC OT 6 Clicks Daily Activity     Outcome Measure Help from another person eating meals?: A Little Help from another person taking care of personal grooming?: A Little Help from  another person toileting, which includes using toliet, bedpan, or urinal?: A Little Help from another person bathing (including washing, rinsing, drying)?: A Little Help from another person to put on and taking off regular upper body clothing?: A Little Help from another person to put on and taking off regular lower body clothing?: A Little 6 Click Score: 18   End of Session Nurse Communication: Mobility status  Activity Tolerance: Patient tolerated treatment  well Patient left: in bed;with bed alarm set (no call bell in room (pt in direct line to nursing station with door open))  OT Visit Diagnosis: Low vision, both eyes (H54.2);Unsteadiness on feet (R26.81);Other abnormalities of gait and mobility (R26.89);Other symptoms and signs involving cognitive function                Time: 8478-8458 OT Time Calculation (min): 20 min Charges:  OT General Charges $OT Visit: 1 Visit OT Evaluation $OT Eval Low Complexity: 1 Low OT Treatments $Self Care/Home Management : 8-22 mins Elianie Hubers L. Ovila Lepage, OTR/L  02/19/24, 5:09 PM

## 2024-02-19 NOTE — ED Notes (Addendum)
 Pt breakfast provided at bedside

## 2024-02-19 NOTE — Evaluation (Signed)
 Physical Therapy Evaluation Patient Details Name: Penny Hart MRN: [id number] DOB: 1946/06/10 Today's Date: 02/19/2024  History of Present Illness  Pt is a 78 y.o. female with PMH that includes: CAD, COPD, type 2 diabetes, hypertension, diverticulosis, and dementia who presents with apparent aggressive behavior at her facility.  She relies on an IVC initiated by police.  Per report, patient was experiencing delusions of grandour stating that she owned the entire skilled nursing facility and when this was disagreed with threatened to kill other residents and herself.  MD assessment inlcudes homicidal ideation, dementia, and agitation.   Clinical Impression  Pt was pleasantly confused but motivated to participate during the session and put forth good effort throughout. Pt demonstrated good speed, effort, and control with bed mobility tasks and transfers.  Pt was able to amb without an AD 125' with no adverse symptoms or overt LOB with occasional HHA provided to help navigate crowded ER area.  Pt stated that she feels like she is at her functional baseline.  Will keep pt on PT caseload while in acute care to prevent functional decline but do not anticipate the need for skilled PT services at discharge.  Pt will benefit from 24/7 supervision, however, secondary to cognitive deficits.           If plan is discharge home, recommend the following: Supervision due to cognitive status;Assist for transportation   Can travel by private vehicle        Equipment Recommendations None recommended by PT  Recommendations for Other Services       Functional Status Assessment Patient has not had a recent decline in their functional status     Precautions / Restrictions Precautions Precautions: Fall Restrictions Weight Bearing Restrictions Per Provider Order: No      Mobility  Bed Mobility Overal bed mobility: Independent             General bed mobility comments: Good speed and effort  with bed mobility tasks    Transfers Overall transfer level: Needs assistance Equipment used: None Transfers: Sit to/from Stand Sit to Stand: Supervision           General transfer comment: Good eccentric and concentric control and stability    Ambulation/Gait Ambulation/Gait assistance: Supervision Gait Distance (Feet): 125 Feet Assistive device: 1 person hand held assist Gait Pattern/deviations: Step-through pattern, Decreased step length - right, Decreased step length - left Gait velocity: decreased     General Gait Details: Mildly reduced cadence and bilateral step length but steady without overt LOB; HHA utilized occasionally more to guide patient through crowded ER area than for support  Stairs            Wheelchair Mobility     Tilt Bed    Modified Rankin (Stroke Patients Only)       Balance Overall balance assessment: No apparent balance deficits (not formally assessed)                                           Pertinent Vitals/Pain Pain Assessment Pain Assessment: No/denies pain    Home Living Family/patient expects to be discharged to:: Assisted living                 Home Equipment: [place] - single point Additional Comments: Pt is an unreliable historian with history and PLOF obtained from chart review from both this admission and recent  prior admission; per chart review this admission pt arrives from [facility]    Prior Function Prior Level of Function : Patient poor historian/Family not available             Mobility Comments: Pt reports Ind amb without an AD with no recent falls, unreliable historian ADLs Comments: Ind with ADLs     Extremity/Trunk Assessment   Upper Extremity Assessment Upper Extremity Assessment: Overall WFL for tasks assessed    Lower Extremity Assessment Lower Extremity Assessment: Overall WFL for tasks assessed       Communication   Communication Communication: No  apparent difficulties    Cognition Arousal: Alert Behavior During Therapy: WFL for tasks assessed/performed   PT - Cognitive impairments: No family/caregiver present to determine baseline, Orientation                       PT - Cognition Comments: Pt alert to self only Following commands: Impaired Following commands impaired: Follows one step commands with increased time     Cueing Cueing Techniques: Verbal cues, Tactile cues, Visual cues     General Comments      Exercises Total Joint Exercises Ankle Circles/Pumps: AROM, Strengthening, Both, 10 reps Towel Squeeze: Strengthening, Both, 10 reps Hip ABduction/ADduction: Strengthening, Both, 10 reps Long Arc Quad: Strengthening, Both, 10 reps Knee Flexion: Strengthening, Both, 10 reps Marching in Standing: Strengthening, Both, 10 reps, Seated   Assessment/Plan    PT Assessment Patient needs continued PT services  PT Problem List Decreased activity tolerance       PT Treatment Interventions Gait training;Functional mobility training;Therapeutic activities;Therapeutic exercise;Balance training;DME instruction;Patient/family education    PT Goals (Current goals can be found in the Care Plan section)  Acute Rehab PT Goals Patient Stated Goal: To return home PT Goal Formulation: With patient Time For Goal Achievement: 03/03/24 Potential to Achieve Goals: Good    Frequency Min 1X/week     Co-evaluation               AM-PAC PT 6 Clicks Mobility  Outcome Measure Help needed turning from your back to your side while in a flat bed without using bedrails?: None Help needed moving from lying on your back to sitting on the side of a flat bed without using bedrails?: None Help needed moving to and from a bed to a chair (including a wheelchair)?: A Little Help needed standing up from a chair using your arms (e.g., wheelchair or bedside chair)?: A Little Help needed to walk in hospital room?: A Little Help  needed climbing 3-5 steps with a railing? : A Little 6 Click Score: 20    End of Session Equipment Utilized During Treatment: Gait belt Activity Tolerance: Patient tolerated treatment well Patient left: in bed;with call bell/phone within reach;with bed alarm set Nurse Communication: Mobility status PT Visit Diagnosis: Difficulty in walking, not elsewhere classified (R26.2)    Time: 8545-8490 PT Time Calculation (min) (ACUTE ONLY): 15 min   Charges:   PT Evaluation $PT Eval Low Complexity: 1 Low   PT General Charges $$ ACUTE PT VISIT: 1 Visit       D. Glendia Bertin PT, DPT 02/19/24, 3:49 PM

## 2024-02-20 LAB — LAMOTRIGINE LEVEL: Lamotrigine Lvl: 5 ug/mL (ref 2.0–20.0)

## 2024-02-20 NOTE — ED Provider Notes (Addendum)
-----------------------------------------   8:24 AM on 02/20/2024 -----------------------------------------   Blood pressure 117/68, pulse (!) 57, temperature 98 F (36.7 C), temperature source Oral, resp. rate 15, height 1.626 m (5' 4), weight 77.1 kg, SpO2 94%.  The patient is calm and cooperative at this time.  The patient was cleared by the psychiatry team yesterday although her IVC status is a bit unclear to me since she was cleared by psychiatry but the IVC is still in place.  A message has been sent to Dr. Donnelly to clarify the situation  Awaiting disposition plan from [facility] team.   Gordan Huxley, MD 02/20/24 TRACIE    Gordan Huxley, MD 02/20/24 [phone]

## 2024-02-20 NOTE — ED Notes (Signed)
VOLUNTARY awaiting TOC disposition 

## 2024-02-20 NOTE — ED Notes (Signed)
 Pt ambulated to restroom independently with minimal standby assistance.

## 2024-02-20 NOTE — ED Notes (Signed)
Pt was provided breakfast tray

## 2024-02-20 NOTE — ED Notes (Signed)
 ivc/psych cleared & recommended for overnight observation.

## 2024-02-20 NOTE — ED Notes (Signed)
Pt given orange juice and water. 

## 2024-02-20 NOTE — ED Notes (Signed)
 Pt lunch tray and beverage left at bedside.

## 2024-02-20 NOTE — ED Notes (Signed)
 VOL patient rescinded by Dr. Jadapalle [phone]

## 2024-02-20 NOTE — ED Notes (Signed)
 Pt given dinner tray and beverage

## 2024-02-21 MED ORDER — POTASSIUM CHLORIDE 20 MEQ PO PACK
20.0000 meq | PACK | Freq: Every day | ORAL | Status: DC
  Administered 2024-02-22 – 2024-03-05 (×15): 20 meq via ORAL
  Filled 2024-02-21 (×13): qty 1

## 2024-02-21 NOTE — ED Provider Notes (Signed)
-----------------------------------------   5:59 AM on 02/21/2024 -----------------------------------------   Blood pressure 133/84, pulse 65, temperature 97.9 F (36.6 C), temperature source Oral, resp. rate 16, height 1.626 m (5' 4), weight 77.1 kg, SpO2 95%.  The patient is calm and cooperative at this time.  There have been no acute events since the last update.  Awaiting disposition plan from [facility] team.   Gordan Huxley, MD 02/21/24 [phone]

## 2024-02-21 NOTE — TOC Progression Note (Signed)
 Transition of Care [facility]) - Progression Note    Patient Details  Name: Penny Hart MRN: [id number] Date of Birth: May 30, 1946  Transition of Care [facility]) CM/SW Contact  Lauraine JAYSON Carpen, LCSW Phone Number: 02/21/2024, 12:33 PM  Clinical Narrative:  CSW spoke to legal guardian. She is requesting LTC SNF placement due to ALF's inability to meet her needs. CSW has sent out referral.   Expected Discharge Plan and Services                                               Social Drivers of Health (SDOH) Interventions SDOH Screenings   Housing: Unknown (08/01/2023)   Received from [facility]  Transportation Needs: No Transportation Needs (03/10/2023)  Tobacco Use: Medium Risk (02/18/2024)    Readmission Risk Interventions     No data to display

## 2024-02-21 NOTE — ED Notes (Signed)
 Pt given dinner tray and beverage

## 2024-02-21 NOTE — ED Notes (Signed)
 Pt had a hard time swallowing potasium pill. ED provider made aware and RN asked to have pt's potasium changed to the powder packets instead. See new orders.

## 2024-02-21 NOTE — NC FL2 (Addendum)
 [place]  MEDICAID FL2 LEVEL OF CARE FORM     IDENTIFICATION  Patient Name: Penny Hart Birthdate: [birth year] Sex: female Admission Date (Current Location): 02/18/2024  [place] and [place] Number:  Chiropodist and Address:  [facility], [address], [place], [place] [address]      Provider Number: [id number]  Attending Physician Name and Address:  Nilsa Dade, MD  Relative Name and Phone Number:       Current Level of Care: Hospital Recommended Level of Care: Skilled Nursing Facility Prior Approval Number:    Date Approved/Denied:   PASRR Number: [id number]. Last name in [place] is [place]. Waiting on DSS to send copy of her ID with last name as Lesches.  Discharge Plan: SNF    Current Diagnoses: Patient Active Problem List   Diagnosis Date Noted   Agitation 12/21/2023   Dementia with behavioral disturbance (HCC) 02/07/2023   Surgery, elective    Chronic diastolic congestive heart failure (HCC)    Chronic obstructive pulmonary disease (HCC)    Diabetes mellitus type 2 in nonobese (HCC)    Benign essential HTN    Post-operative pain    Hyponatremia    Leukocytosis    Spondylolisthesis of lumbar region 04/27/2017   Blood in stool    Hiatal hernia    Benign neoplasm of cecum    Benign neoplasm of transverse colon    Diverticulosis of large intestine without diverticulitis    Rectal bleeding 05/29/2015   Non-intractable cyclical vomiting with nausea     Orientation RESPIRATION BLADDER Height & Weight     Self  Normal Continent Weight: 170 lb (77.1 kg) Height:  5' 4 (162.6 cm)  BEHAVIORAL SYMPTOMS/MOOD NEUROLOGICAL BOWEL NUTRITION STATUS   (Calm, Cooperative)  (Dementia) Continent Diet  AMBULATORY STATUS COMMUNICATION OF NEEDS Skin   Supervision Verbally Normal                       Personal Care Assistance Level of Assistance  Bathing, Feeding, Dressing Bathing Assistance: Maximum assistance Feeding assistance:  Limited assistance Dressing Assistance: Maximum assistance     Functional Limitations Info  Sight, Hearing, Speech Sight Info: Adequate Hearing Info: Adequate Speech Info: Adequate    SPECIAL CARE FACTORS FREQUENCY                       Contractures Contractures Info: Not present    Additional Factors Info  Code Status, Allergies Code Status Info: Full code Allergies Info: Oxycodone -acetaminophen , Tramadol           Current Medications (02/21/2024):  This is the current hospital active medication list Current Facility-Administered Medications  Medication Dose Route Frequency Provider Last Rate Last Admin   dorzolamide -timolol  (COSOPT ) 2-0.5 % ophthalmic solution 1 drop  1 drop Left Eye BID Nicholaus Maize E, MD   1 drop at 02/21/24 1038   isosorbide  mononitrate (IMDUR ) 24 hr tablet 30 mg  30 mg Oral Daily Nicholaus Maize E, MD   30 mg at 02/21/24 1039   lamoTRIgine  (LAMICTAL ) tablet 50 mg  50 mg Oral BID Davis, Hillary E, MD   50 mg at 02/21/24 1038   latanoprost  (XALATAN ) 0.005 % ophthalmic solution 1 drop  1 drop Both Eyes QHS Nicholaus Maize E, MD   1 drop at 02/20/24 2138   metoprolol  succinate (TOPROL -XL) 24 hr tablet 25 mg  25 mg Oral Daily Davis, Hillary E, MD   25 mg at 02/19/24  1137   mirtazapine  (REMERON ) tablet 30 mg  30 mg Oral QHS Davis, Hillary E, MD   30 mg at 02/20/24 2139   potassium chloride  SA (KLOR-CON  M) CR tablet 10 mEq  10 mEq Oral BID Nicholaus Rolland BRAVO, MD   10 mEq at 02/21/24 1039   pravastatin  (PRAVACHOL ) tablet 10 mg  10 mg Oral QPM Nicholaus Rolland BRAVO, MD   10 mg at 02/20/24 1850   torsemide  (DEMADEX ) tablet 20 mg  20 mg Oral Daily Nicholaus Rolland E, MD   20 mg at 02/21/24 1038   Current Outpatient Medications  Medication Sig Dispense Refill   aspirin  EC 81 MG tablet Take 1 tablet (81 mg total) by mouth daily. Swallow whole. 30 tablet 2   Dorzolamide  HCl-Timolol  Mal PF 2-0.5 % SOLN Place 1 drop into the left eye 2 (two) times daily.     isosorbide   mononitrate (IMDUR ) 30 MG 24 hr tablet Take 1 tablet (30 mg total) by mouth daily. 30 tablet 2   lamoTRIgine  (LAMICTAL ) 25 MG tablet Take 2 tablets (50 mg total) by mouth 2 (two) times daily. (Patient taking differently: Take 75 mg by mouth 2 (two) times daily.) 120 tablet 2   latanoprost  (XALATAN ) 0.005 % ophthalmic solution Place 1 drop into both eyes at bedtime. 1.5 mL 11   LORazepam  (ATIVAN ) 0.5 MG tablet Take 0.5 mg by mouth every 6 (six) hours as needed for anxiety.     metoprolol  succinate (TOPROL -XL) 25 MG 24 hr tablet Take 1 tablet (25 mg total) by mouth daily. 30 tablet 2   mirtazapine  (REMERON ) 15 MG tablet Take 1 tablet (15 mg total) by mouth at bedtime. (Patient taking differently: Take 30 mg by mouth at bedtime.) 30 tablet 0   potassium chloride  (KLOR-CON  M) 10 MEQ tablet Take 1 tablet (10 mEq total) by mouth 2 (two) times daily. 60 tablet 2   pravastatin  (PRAVACHOL ) 10 MG tablet Take 1 tablet (10 mg total) by mouth every evening. 30 tablet 2   QUEtiapine  (SEROQUEL ) 25 MG tablet Take 1 tablet (25 mg total) by mouth daily in the afternoon. 3 pm 30 tablet 1   QUEtiapine  (SEROQUEL ) 50 MG tablet Take 1 tablet (50 mg total) by mouth 2 (two) times daily. 60 tablet 1   torsemide  (DEMADEX ) 20 MG tablet Take 1 tablet (20 mg total) by mouth daily. 30 tablet 2   potassium chloride  (KLOR-CON ) 10 MEQ tablet Take 10 mEq by mouth 2 (two) times daily.       Discharge Medications: Please see discharge summary for a list of discharge medications.  Relevant Imaging Results:  Relevant Lab Results:   Additional Information SS#: [id number]. DSS is her legal guardian. Was previously in an ALF but they can no longer meet her needs. Looking for LTC SNF. DSS has started LTC Medicaid application. Does not need rehab per PT.  Lauraine JAYSON Carpen, LCSW

## 2024-02-21 NOTE — Progress Notes (Signed)
 Occupational Therapy Treatment Patient Details Name: Penny Hart MRN: [id number] DOB: 20-Aug-1945 Today's Date: 02/21/2024   History of present illness Pt is a 78 y.o. female with PMH that includes: CAD, COPD, type 2 diabetes, hypertension, diverticulosis, and dementia who presents with apparent aggressive behavior at her facility.  She relies on an IVC initiated by police.  Per report, patient was experiencing delusions of grandour stating that she owned the entire skilled nursing facility and when this was disagreed with threatened to kill other residents and herself.  MD assessment inlcudes homicidal ideation, dementia, and agitation.   OT comments  Pt seen for OT tx, pleasant and cooperative throughout session. Pt is able to complete functional STS transfers with supervision - CGA for safety, stands at sink for oral care and completes functional mobility 200 ft in hallway with +1 HHA. Pt with LOB during direction change, requiring MAX A to intervene to prevent fall. BSC placed over standard commode to assist with independence in toilet transfers. Pt is able to complete clothing management and hygiene with verbal cues but no physical assist. Pt benefits from max verbal cues to locate task items due to visual deficits. Discharge recommendation remains appropriate, OT will continue to follow acutely.       If plan is discharge home, recommend the following:  A little help with walking and/or transfers;A little help with bathing/dressing/bathroom;Assistance with feeding;Assistance with cooking/housework;Direct supervision/assist for medications management;Direct supervision/assist for financial management;Assist for transportation;Help with stairs or ramp for entrance;Supervision due to cognitive status   Equipment Recommendations  None recommended by OT       Precautions / Restrictions Precautions Precautions: Fall Restrictions Weight Bearing Restrictions Per Provider Order: No        Mobility Bed Mobility Overal bed mobility: Independent             General bed mobility comments: easily exits/enters hospital gurney    Transfers Overall transfer level: Needs assistance Equipment used: None Transfers: Sit to/from Stand Sit to Stand: Contact guard assist, Supervision                 Balance Overall balance assessment: Needs assistance Sitting-balance support: No upper extremity supported, Feet supported Sitting balance-Leahy Scale: Good     Standing balance support: No upper extremity supported Standing balance-Leahy Scale: Fair Standing balance comment: CGA for dynamic balance tasks for safety                           ADL either performed or assessed with clinical judgement   ADL Overall ADL's : Needs assistance/impaired     Grooming: Wash/dry hands;Oral care;Standing;Cueing for sequencing;Cueing for safety Grooming Details (indicate cue type and reason): sink level. requires verbal cues for pt to locate task items (toothbrush, toothpaste, paper towels and soap). pt with decreased STM; pt completing oral care at sink with OT providing orientation to item location and pt requiring same cuing second attempt to wash hands after toileting                 Toilet Transfer: Ambulation;Grab bars;Contact guard assist Toilet Transfer Details (indicate cue type and reason): t/f standard commode, pt able to control descent and rise with use of grab bars. BSC placed over standard ED commode for future use Toileting- Clothing Manipulation and Hygiene: Set up;Cueing for sequencing;Sit to/from stand;Contact guard assist Toileting - Clothing Manipulation Details (indicate cue type and reason): vc for location of toilet paper, pt manages clothing and  hygeine with CGA for standing balance     Functional mobility during ADLs: Contact guard assist;Maximal assistance;Cueing for sequencing;Cueing for safety General ADL Comments: functional mobility  200 ft in hallway with +1 HHA, pt with 1 LOB during directional change while turning requiring MAX A to correct     Communication Communication Communication: No apparent difficulties   Cognition Arousal: Alert Behavior During Therapy: WFL for tasks assessed/performed Cognition: History of cognitive impairments             OT - Cognition Comments: pt pleasant and cooperative                 Following commands: Impaired Following commands impaired: Follows one step commands with increased time      Cueing   Cueing Techniques: Verbal cues, Tactile cues             Pertinent Vitals/ Pain       Pain Assessment Pain Assessment: No/denies pain   Frequency  Min 2X/week        Progress Toward Goals  OT Goals(current goals can now be found in the care plan section)  Progress towards OT goals: Progressing toward goals  Acute Rehab OT Goals OT Goal Formulation: Patient unable to participate in goal setting Time For Goal Achievement: 03/04/24 Potential to Achieve Goals: Fair ADL Goals Pt Will Perform Grooming: with supervision;standing Pt Will Perform Upper Body Dressing: with supervision;sitting Pt Will Perform Lower Body Dressing: with supervision;sitting/lateral leans;sit to/from stand Pt Will Transfer to Toilet: with supervision;ambulating Pt Will Perform Toileting - Clothing Manipulation and hygiene: with supervision;sitting/lateral leans;sit to/from stand  Plan      AM-PAC OT 6 Clicks Daily Activity     Outcome Measure   Help from another person eating meals?: A Little Help from another person taking care of personal grooming?: A Little Help from another person toileting, which includes using toliet, bedpan, or urinal?: A Little Help from another person bathing (including washing, rinsing, drying)?: A Little Help from another person to put on and taking off regular upper body clothing?: A Little Help from another person to put on and taking off  regular lower body clothing?: A Little 6 Click Score: 18    End of Session Equipment Utilized During Treatment: Gait belt  OT Visit Diagnosis: Low vision, both eyes (H54.2);Unsteadiness on feet (R26.81);Other abnormalities of gait and mobility (R26.89);Other symptoms and signs involving cognitive function   Activity Tolerance Patient tolerated treatment well   Patient Left in bed;with bed alarm set;Other (comment) (no call bell in room, door left open and pt in direct line of sight to multiple nsg stations)   Nurse Communication Mobility status        Time: 8841-8776 OT Time Calculation (min): 25 min  Charges: OT General Charges $OT Visit: 1 Visit OT Treatments $Self Care/Home Management : 8-22 mins $Therapeutic Activity: 8-22 mins  Lamoine Magallon L. Rether Rison, OTR/L  02/21/24, 12:49 PM

## 2024-02-21 NOTE — ED Notes (Signed)
 Breakfast tray given to pt

## 2024-02-22 NOTE — TOC CM/SW Note (Deleted)
..  Transition of Care [facility]) - Inpatient Brief Assessment   Patient Details  Name: Penny Hart MRN: [id number] Date of Birth: 11-24-1945  Transition of Care [facility]) CM/SW Contact:    [name], LCSW Phone Number: 02/22/2024, 10:22 AM   Clinical Narrative:  Transition of Care Asessment:

## 2024-02-22 NOTE — ED Notes (Signed)
Lunch tray was provided to pt.

## 2024-02-22 NOTE — ED Notes (Signed)
 Breakfast tray was provided at bedside.

## 2024-02-22 NOTE — ED Notes (Signed)
 Pt up adjusting bed linens,  pt assisted with blanket placement and back in to the bed, NAD and resting comfortably., bed alarm reactivated

## 2024-02-22 NOTE — ED Provider Notes (Signed)
-----------------------------------------   7:01 AM on 02/22/2024 -----------------------------------------   Blood pressure 123/70, pulse (!) 51, temperature 98 F (36.7 C), resp. rate 16, height 5' 4 (1.626 m), weight 77.1 kg, SpO2 94%.  The patient is calm and cooperative at this time.  There have been no acute events since the last update.  Awaiting disposition plan from case management/social work.     Alexina Niccoli, Josette SAILOR, DO 02/22/24 [phone]

## 2024-02-22 NOTE — Progress Notes (Signed)
 Occupational Therapy Treatment Patient Details Name: Penny Hart MRN: [id number] DOB: Jul 18, 1946 Today's Date: 02/22/2024   History of present illness Pt is a 78 y.o. female with PMH that includes: CAD, COPD, type 2 diabetes, hypertension, diverticulosis, and dementia who presents with apparent aggressive behavior at her facility.  She relies on an IVC initiated by police.  Per report, patient was experiencing delusions of grandour stating that she owned the entire skilled nursing facility and when this was disagreed with threatened to kill other residents and herself.  MD assessment inlcudes homicidal ideation, dementia, and agitation.   OT comments  Pt making good progress towards goals. Pleasant and cooperative throughout session, appropriate and follows all directions with increased time as needed. Pt completes full ADL routine including UB/LB bathing + dressing from STS, no AD. Pt benefits from verbal cues for both safety and sequencing due to vision deficits. Pt able to bathe UB with setup/supervision, doffs pants over hips standing and sits to doff over feet with mod vcs. Pt able to demonstrate carryover of cues as she dons LB clothing items (underwear, pants and socks) from STS with min vcs. Pt applies lotion to LB per preference. Functional mobility in hallway using RW (offered use of AD due to pt report of knee discomfort) 250+ ft, no LOB but mod verbal and tactile cues for environmental negotiation. Pt progressing towards goals, discharge recommendation appropriate.       If plan is discharge home, recommend the following:  A little help with walking and/or transfers;A little help with bathing/dressing/bathroom;Assistance with feeding;Assistance with cooking/housework;Direct supervision/assist for medications management;Direct supervision/assist for financial management;Assist for transportation;Help with stairs or ramp for entrance;Supervision due to cognitive status   Equipment  Recommendations  None recommended by OT       Precautions / Restrictions Precautions Precautions: Fall Restrictions Weight Bearing Restrictions Per Provider Order: No       Mobility Bed Mobility Overal bed mobility: Independent             General bed mobility comments: pt found standing in room next to gurney    Transfers Overall transfer level: Needs assistance Equipment used: None Transfers: Sit to/from Stand Sit to Stand: Supervision           General transfer comment: good control, performs 10+ STS during LB dressing without LOB     Balance Overall balance assessment: Needs assistance Sitting-balance support: No upper extremity supported, Feet supported Sitting balance-Leahy Scale: Good     Standing balance support: No upper extremity supported Standing balance-Leahy Scale: Fair Standing balance comment: CGA for dynamic balance tasks for safety                           ADL either performed or assessed with clinical judgement   ADL Overall ADL's : Needs assistance/impaired     Grooming: Sitting;Wash/dry hands;Wash/dry face;Supervision/safety;Applying deodorant Grooming Details (indicate cue type and reason): sitting on bench Upper Body Bathing: Supervision/ safety;Sitting   Lower Body Bathing: Supervison/ safety;Sit to/from stand Lower Body Bathing Details (indicate cue type and reason): cues for safety to sit to perform seated figure four to wash feet/BLE (pt initiating washing feet standing on one leg) Upper Body Dressing : Supervision/safety;Sitting;Cueing for sequencing Upper Body Dressing Details (indicate cue type and reason): cues to orient shirt due to vision deficits Lower Body Dressing: Sit to/from stand;Cueing for sequencing;Supervision/safety;Cueing for safety Lower Body Dressing Details (indicate cue type and reason): cues to orient pants, cues  for safety to perform seated to don underwear, pants and socks seated, standing to  pull pants over hips             Functional mobility during ADLs: Supervision/safety;Contact guard assist;Rolling walker (2 wheels);Cueing for sequencing;Cueing for safety General ADL Comments: Functional mobility in hallway using RW (pt reporting knee discomfort, offered use of RW). UB/LB bathing and dressing     Communication Communication Communication: No apparent difficulties   Cognition Arousal: Alert Behavior During Therapy: WFL for tasks assessed/performed Cognition: History of cognitive impairments             OT - Cognition Comments: pt pleasant and cooperative. very grateful to have clean clothes and a full bath, pt verbalizing her thanks throughout session                 Following commands: Impaired Following commands impaired: Follows one step commands with increased time      Cueing   Cueing Techniques: Verbal cues, Tactile cues        General Comments Provided pt with bed linen change. Recliner setup in room, pt declines to sit in chair at this time    Pertinent Vitals/ Pain       Pain Assessment Pain Assessment: No/denies pain   Frequency  Min 2X/week        Progress Toward Goals  OT Goals(current goals can now be found in the care plan section)  Progress towards OT goals: Progressing toward goals  Acute Rehab OT Goals OT Goal Formulation: Patient unable to participate in goal setting Time For Goal Achievement: 03/04/24 Potential to Achieve Goals: Fair ADL Goals Pt Will Perform Grooming: with supervision;standing Pt Will Perform Upper Body Dressing: with supervision;sitting Pt Will Perform Lower Body Dressing: with supervision;sitting/lateral leans;sit to/from stand Pt Will Transfer to Toilet: with supervision;ambulating Pt Will Perform Toileting - Clothing Manipulation and hygiene: with supervision;sitting/lateral leans;sit to/from stand  Plan         AM-PAC OT 6 Clicks Daily Activity     Outcome Measure   Help from  another person eating meals?: A Little Help from another person taking care of personal grooming?: A Little Help from another person toileting, which includes using toliet, bedpan, or urinal?: A Little Help from another person bathing (including washing, rinsing, drying)?: A Little Help from another person to put on and taking off regular upper body clothing?: A Little Help from another person to put on and taking off regular lower body clothing?: A Little 6 Click Score: 18    End of Session Equipment Utilized During Treatment: Gait belt;Rolling walker (2 wheels)  OT Visit Diagnosis: Low vision, both eyes (H54.2);Unsteadiness on feet (R26.81);Other abnormalities of gait and mobility (R26.89);Other symptoms and signs involving cognitive function   Activity Tolerance Patient tolerated treatment well   Patient Left in bed;with bed alarm set;Other (comment) (no call bell in room, door left open and pt in direct line of sight to multiple nsg stations)   Nurse Communication Mobility status        Time: 8964-8886 OT Time Calculation (min): 38 min  Charges: OT General Charges $OT Visit: 1 Visit OT Treatments $Self Care/Home Management : 23-37 mins $Therapeutic Activity: 8-22 mins  Syanna Remmert L. Almadelia Looman, OTR/L  02/22/24, 11:44 AM

## 2024-02-22 NOTE — ED Notes (Signed)
 Pt assisted to the bathroom and back to bed, reassured pt she was in the hospital and family was aware that she is here. Bed alarm reactivated

## 2024-02-22 NOTE — ED Notes (Signed)
 Vitals where completed.

## 2024-02-22 NOTE — ED Notes (Signed)
 Pt consumed 100% of breakfast tray, trash discarded appropriately.

## 2024-02-22 NOTE — ED Notes (Signed)
 TOC

## 2024-02-22 NOTE — ED Notes (Signed)
 Fall precautions in place for Pt. This RN placed fall band, fall grip socks, bed alarm and fall sign.

## 2024-02-22 NOTE — ED Provider Notes (Signed)
   [facility] Observation Note   ----------------------------------------- 10:06 PM on 02/22/2024 -----------------------------------------  Penny Hart is a 78 y.o. female currently boarding in the Emergency Department.  No acute events since last update.  Recent Vitals   Most recent vital signs: Vitals:   02/22/24 0911 02/22/24 0947  BP: 113/71 113/71  Pulse: 68 68  Resp: 18   Temp: 97.8 F (36.6 C)   SpO2: 94%     ED Results / Procedures / Treatments   Labs (all labs ordered are listed, but only abnormal results are displayed) Labs Reviewed  COMPREHENSIVE METABOLIC PANEL WITH GFR - Abnormal; Notable for the following components:      Result Value   Glucose, Bld 113 (*)    All other components within normal limits  URINE DRUG SCREEN, QUALITATIVE ([facility] ONLY) - Abnormal; Notable for the following components:   Tricyclic, Ur Screen POSITIVE (*)    All other components within normal limits  URINALYSIS, COMPLETE (UACMP) WITH MICROSCOPIC - Abnormal; Notable for the following components:   Color, Urine COLORLESS (*)    APPearance CLEAR (*)    Specific Gravity, Urine 1.004 (*)    All other components within normal limits  ETHANOL  CBC  LAMOTRIGINE  LEVEL    MEDICATIONS ORDERED IN ED: Medications  isosorbide  mononitrate (IMDUR ) 24 hr tablet 30 mg (30 mg Oral Given 02/22/24 0947)  latanoprost  (XALATAN ) 0.005 % ophthalmic solution 1 drop (1 drop Both Eyes Given 02/22/24 2112)  lamoTRIgine  (LAMICTAL ) tablet 50 mg (50 mg Oral Given 02/22/24 2112)  dorzolamide -timolol  (COSOPT ) 2-0.5 % ophthalmic solution 1 drop (1 drop Left Eye Given 02/22/24 2112)  metoprolol  succinate (TOPROL -XL) 24 hr tablet 25 mg (25 mg Oral Given 02/22/24 0947)  pravastatin  (PRAVACHOL ) tablet 10 mg (10 mg Oral Given 02/22/24 1808)  torsemide  (DEMADEX ) tablet 20 mg (20 mg Oral Given 02/22/24 0947)  mirtazapine  (REMERON ) tablet 30 mg (30 mg Oral Given 02/22/24 2112)  potassium  chloride (KLOR-CON ) packet 20 mEq (20 mEq Oral Given 02/22/24 0947)     ED Plan   Currently awaiting placement into an appropriate living facility.  Social work is working with the patient to help achieve this.      Dorothyann Drivers, MD 02/22/24 2206

## 2024-02-23 MED ORDER — HALOPERIDOL LACTATE 5 MG/ML IJ SOLN
2.0000 mg | Freq: Once | INTRAMUSCULAR | Status: AC
  Administered 2024-02-23: 2 mg via INTRAMUSCULAR
  Filled 2024-02-23: qty 1

## 2024-02-23 NOTE — TOC Progression Note (Signed)
 Transition of Care [facility]) - Progression Note    Patient Details  Name: Penny Hart MRN: [id number] Date of Birth: [birth year]  [facility] CM/SW Contact  Lauraine JAYSON Carpen, LCSW Phone Number: 02/23/2024, 2:41 PM  Clinical Narrative:  Patient's PASARR has last name, Ramsey. Guardian sent CSW most recent driver's license with last name, Rothlisberger. CSW faxed to  Must to correct for PASARR.   Expected Discharge Plan and Services                                               Social Drivers of Health (SDOH) Interventions SDOH Screenings   Housing: Unknown (08/01/2023)   Received from [facility]  Transportation Needs: No Transportation Needs (03/10/2023)  Tobacco Use: Medium Risk (02/18/2024)    Readmission Risk Interventions     No data to display

## 2024-02-23 NOTE — ED Notes (Signed)
 Pt wont remain in the bed.. She wants to walk around the hallway and has to be redirected back to bed

## 2024-02-23 NOTE — ED Notes (Signed)
 Pt out of bed, being verbally abusive, combative, uncooperative, and agitated. ED provider notified and medication requested.

## 2024-02-23 NOTE — ED Notes (Signed)
 Pt sitting in recliner at this time. Continues to be verbally abusive and agitated.

## 2024-02-23 NOTE — ED Notes (Signed)
 Assisted pt ambulate to the toilet in room at this time.

## 2024-02-23 NOTE — ED Provider Notes (Addendum)
 Emergency Medicine Observation Re-evaluation Note  Penny Hart is a 78 y.o. female, seen on rounds today.  Pt initially presented to the ED for complaints of Agitation  Currently, the patient is is no acute distress. Denies any concerns at this time.  Physical Exam  Blood pressure 116/75, pulse 69, temperature 98.5 F (36.9 C), temperature source Oral, resp. rate 16, height 5' 4 (1.626 m), weight 77.1 kg, SpO2 99%.  Physical Exam: General: No apparent distress Pulm: Normal WOB Neuro: Moving all extremities Psych: Resting comfortably     ED Course / MDM   Clinical Course as of 02/23/24 0809  Sun Feb 18, 2024  1443 Urinalysis, Complete w Microscopic -Urine, Clean Catch(!) Not consistent with UTI.  I will medically clear the patient [HD]    Clinical Course User Index [HD] Nicholaus Rolland BRAVO, MD    I have reviewed the labs performed to date as well as medications administered while in observation.  Recent changes in the last 24 hours include: No acute events overnight.  Patient was cleared by psychiatry and recommended for discharge.  Unable to meet patient's need at her long-term care facility currently working for placement with social work.  10:11 AM patient agitated and verbally abusive, uncooperative, attempted to verbally de-escalate.  Unable to verbally de-escalate, will give a dose of IM Haldol .  Plan   Current plan: Patient awaiting social work placement Patient is under full IVC at this time.    Suzanne Kirsch, MD 02/23/24 9186    Suzanne Kirsch, MD 02/23/24 1011

## 2024-02-23 NOTE — ED Notes (Signed)
 Fall Bundle in place

## 2024-02-23 NOTE — ED Notes (Signed)
 Pt is back in bed and being calm/cooperative at this time

## 2024-02-24 MED ORDER — ASPIRIN 81 MG PO TBEC
81.0000 mg | DELAYED_RELEASE_TABLET | Freq: Every day | ORAL | Status: DC
  Administered 2024-02-24 – 2024-03-05 (×13): 81 mg via ORAL
  Filled 2024-02-24 (×11): qty 1

## 2024-02-24 NOTE — ED Notes (Signed)
Ambulated pt to the bathroom at this time

## 2024-02-24 NOTE — ED Notes (Signed)
 Pt is resting. Equal chest rise and fall noted with movement of her feet under the blanket

## 2024-02-24 NOTE — ED Notes (Signed)
 Bed alarm and falls bracelet verified on, pt education on use of call bell reinforced.

## 2024-02-24 NOTE — ED Provider Notes (Signed)
 Emergency Medicine Observation Re-evaluation Note  Penny Hart is a 78 y.o. female, seen on rounds today.  Pt initially presented to the ED for complaints of Agitation  Currently, the patient is calm, no acute complaints.  Physical Exam  Blood pressure 113/65, pulse (!) 53, temperature 98 F (36.7 C), temperature source Oral, resp. rate 18, height 5' 4 (1.626 m), weight 77.1 kg, SpO2 98%. Physical Exam General: NAD Lungs: CTAB Psych: not agitated  ED Course / MDM  EKG:    I have reviewed the labs performed to date as well as medications administered while in observation.  Recent changes in the last 24 hours include no acute events overnight.    Plan  Current plan is for TOC placement.   Viviann Pastor, MD 02/24/24 2133

## 2024-02-24 NOTE — ED Notes (Signed)
 Pt in bed eating dinner, family at bedside.

## 2024-02-24 NOTE — Progress Notes (Signed)
 Physical Therapy Treatment Patient Details Name: Penny Hart MRN: [id number] DOB: 12-Mar-1946 Today's Date: 02/24/2024   History of Present Illness Pt is a 78 y.o. female with PMH that includes: CAD, COPD, type 2 diabetes, hypertension, diverticulosis, and dementia who presents with apparent aggressive behavior at her facility.  She relies on an IVC initiated by police.  Per report, patient was experiencing delusions of grandour stating that she owned the entire skilled nursing facility and when this was disagreed with threatened to kill other residents and herself.  MD assessment inlcudes homicidal ideation, dementia, and agitation.    PT Comments  Pt was supine in bed with sister and niece at bedside. Pt is alert but remains disoriented. With encouragement, pt was agreeable to OOB activity. She demonstrated safe abilities to exit bed, stand to RW and tolerate ambulation around ED unit. Did attempt some gait without AD and with HHA +1. Pt much safer with use of RW. Acute PT will continue to follow per current POC. DC recs are appropriate since pt is at/close to baseline.  Will consider Dcing from acute OPT services next session.    If plan is discharge home, recommend the following: Supervision due to cognitive status;Assist for transportation     Equipment Recommendations  None recommended by PT       Precautions / Restrictions Precautions Precautions: Fall Restrictions Weight Bearing Restrictions Per Provider Order: No     Mobility  Bed Mobility Overal bed mobility: Modified Independent  General bed mobility comments: pt exited bed getting off the foot of bed versus side of bed. No physical assist required    Transfers Overall transfer level: Needs assistance Equipment used: Rolling walker (2 wheels), None Transfers: Sit to/from Stand Sit to Stand: Supervision, Contact guard assist     Ambulation/Gait Ambulation/Gait assistance: Contact guard assist, Supervision Gait  Distance (Feet): 200 Feet Assistive device: Rolling walker (2 wheels), 1 person hand held assist, None Gait Pattern/deviations: Step-through pattern Gait velocity: decreased  General Gait Details: pt demonstartes safer gait abilities with sue of RW. does occasionally need assistance with navigation due to visdion impairments. recommend continue use of AD    Balance Overall balance assessment: Needs assistance Sitting-balance support: No upper extremity supported, Feet supported Sitting balance-Leahy Scale: Good     Standing balance support: No upper extremity supported Standing balance-Leahy Scale: Fair      Musician Communication: Impaired;Other (comment) Factors Affecting Communication: Other (comment) (visually impaired)  Cognition Arousal: Alert Behavior During Therapy: WFL for tasks assessed/performed   PT - Cognitive impairments: History of cognitive impairments    PT - Cognition Comments: Pt is alert but disoriented x 3. does follow commands and was cooperative + pleasant throughout Following commands: Intact Following commands impaired: Follows one step commands with increased time, Follows multi-step commands inconsistently    Cueing Cueing Techniques: Verbal cues, Tactile cues, Visual cues         Pertinent Vitals/Pain Pain Assessment Pain Assessment: No/denies pain     PT Goals (current goals can now be found in the care plan section) Acute Rehab PT Goals Patient Stated Goal: none stated. Supportive sister in neice in room Progress towards PT goals: Progressing toward goals    Frequency    Min 1X/week       AM-PAC PT 6 Clicks Mobility   Outcome Measure  Help needed turning from your back to your side while in a flat bed without using bedrails?: None Help needed moving from lying on your back to  sitting on the side of a flat bed without using bedrails?: None Help needed moving to and from a bed to a chair (including a  wheelchair)?: A Little Help needed standing up from a chair using your arms (e.g., wheelchair or bedside chair)?: A Little Help needed to walk in hospital room?: A Little Help needed climbing 3-5 steps with a railing? : A Little 6 Click Score: 20    End of Session   Activity Tolerance: Patient tolerated treatment well Patient left: in bed;with call bell/phone within reach;with bed alarm set;with family/visitor present Nurse Communication: Mobility status PT Visit Diagnosis: Difficulty in walking, not elsewhere classified (R26.2)     Time: 8389-8379 PT Time Calculation (min) (ACUTE ONLY): 10 min  Charges:    $Gait Training: 8-22 mins PT General Charges $$ ACUTE PT VISIT: 1 Visit                     Rankin Essex PTA 02/24/24, 4:39 PM

## 2024-02-24 NOTE — ED Notes (Signed)
 Pt with pt at this time.

## 2024-02-24 NOTE — ED Notes (Signed)
 Pt assisted to toilet and back to bed at this time. Pt states she feels constipated.

## 2024-02-25 NOTE — ED Notes (Signed)
 Pt toilet is working again.

## 2024-02-25 NOTE — ED Provider Notes (Signed)
 Emergency Medicine Observation Re-evaluation Note  Penny Hart is a 78 y.o. female, seen on rounds today.  No issues overnight  Physical Exam  Vital signs stable  ED Course / MDM    Plan  Patient is pending TOC placement    Arlander Charleston, MD 02/25/24 0150

## 2024-02-25 NOTE — ED Notes (Signed)
 Pt was assisted to the bathroom without any incident. Upon flushing the toilet it was noticed that the toilet was clogged. A ticket  was made and the toilet was taped and the pt was made aware that she could not use same.

## 2024-02-25 NOTE — TOC Progression Note (Signed)
 Transition of Care [facility]) - Progression Note    Patient Details  Name: AMAHIA MADONIA MRN: [id number] Date of Birth: 19-Mar-1946  [facility] CM/SW Contact  [place] L Christionna [place], [place] Phone Number: 02/25/2024, 9:40 AM  Clinical Narrative:     Bed offers still pending for LTC.                     Expected Discharge Plan and Services                                               Social Drivers of Health (SDOH) Interventions SDOH Screenings   Housing: Unknown (08/01/2023)   Received from [facility]  Transportation Needs: No Transportation Needs (03/10/2023)  Tobacco Use: Medium Risk (02/18/2024)    Readmission Risk Interventions     No data to display

## 2024-02-25 NOTE — ED Notes (Signed)
 Breakfast tray disposed of by this RN

## 2024-02-25 NOTE — ED Notes (Signed)
 Pts daughter at bedside

## 2024-02-26 NOTE — ED Notes (Signed)
 Assume care of patient, pt resting in bed in room, alert and confused, no distress noted

## 2024-02-26 NOTE — ED Notes (Signed)
vol/toc placement.. 

## 2024-02-26 NOTE — ED Notes (Signed)
 Pt up walking in rom. Fall socks and bracelet in place.

## 2024-02-26 NOTE — ED Notes (Signed)
 Pt given a dinner tray

## 2024-02-26 NOTE — ED Notes (Signed)
 Pt ambulatory to bathroom in room.

## 2024-02-26 NOTE — [place] FL2 (Signed)
 [place]  MEDICAID FL2 LEVEL OF CARE FORM     IDENTIFICATION  Patient Name: Penny Hart Birthdate: 1945-12-12 Sex: female Admission Date (Current Location): 02/18/2024  [place] and [place] Number:  Chiropodist and Address:  [facility], [address], [place], [place] [address]      Provider Number: [id number]  Attending Physician Name and Address:  No att. providers found  Relative Name and Phone Number:  Bird,[name] (Legal Guardian)  [phone] (Mobile)    Current Level of Care: Hospital Recommended Level of Care: Family Care Home, Assisted Living Facility, Memory Care Prior Approval Number:    Date Approved/Denied:   PASRR Number: [id number]. Last name is Elfers MUST is [place].  Waiting on DSS to send copy of her ID and last name as Gillin  Discharge Plan: Other (Comment) (ALF, Family Care Home, Alternative Living Facitliy, Memory Care)    Current Diagnoses: Patient Active Problem List   Diagnosis Date Noted   Agitation 12/21/2023   Dementia with behavioral disturbance (HCC) 02/07/2023   Surgery, elective    Chronic diastolic congestive heart failure (HCC)    Chronic obstructive pulmonary disease (HCC)    Diabetes mellitus type 2 in nonobese (HCC)    Benign essential HTN    Post-operative pain    Hyponatremia    Leukocytosis    Spondylolisthesis of lumbar region 04/27/2017   Blood in stool    Hiatal hernia    Benign neoplasm of cecum    Benign neoplasm of transverse colon    Diverticulosis of large intestine without diverticulitis    Rectal bleeding 05/29/2015   Non-intractable cyclical vomiting with nausea     Orientation RESPIRATION BLADDER Height & Weight     Self  Normal Continent Weight: 170 lb (77.1 kg) Height:  5' 4 (162.6 cm)  BEHAVIORAL SYMPTOMS/MOOD NEUROLOGICAL BOWEL NUTRITION STATUS   (Calm, Cooperative)  (Dementia) Continent Diet  AMBULATORY STATUS COMMUNICATION OF NEEDS Skin   Supervision  Verbally Normal                       Personal Care Assistance Level of Assistance  Bathing, Feeding, Dressing Bathing Assistance: Maximum assistance Feeding assistance: Limited assistance Dressing Assistance: Maximum assistance     Functional Limitations Info  Sight, Hearing Sight Info: Adequate Hearing Info: Adequate Speech Info: Adequate    SPECIAL CARE FACTORS FREQUENCY                       Contractures Contractures Info: Not present    Additional Factors Info  Code Status, Allergies Code Status Info: Full Code Allergies Info: Oxycodone -acetaminophen , Tramadol           Current Medications (02/26/2024):  This is the current hospital active medication list Current Facility-Administered Medications  Medication Dose Route Frequency Provider Last Rate Last Admin   aspirin  EC tablet 81 mg  81 mg Oral Daily Bradler, Evan K, MD   81 mg at 02/26/24 1032   dorzolamide -timolol  (COSOPT ) 2-0.5 % ophthalmic solution 1 drop  1 drop Left Eye BID Nicholaus Maize E, MD   1 drop at 02/26/24 1032   isosorbide  mononitrate (IMDUR ) 24 hr tablet 30 mg  30 mg Oral Daily Nicholaus Maize E, MD   30 mg at 02/26/24 1035   lamoTRIgine  (LAMICTAL ) tablet 50 mg  50 mg Oral BID Nicholaus Maize BRAVO, MD   50 mg at 02/26/24 1035   latanoprost  (XALATAN ) 0.005 % ophthalmic solution 1  drop  1 drop Both Eyes QHS Nicholaus Maize E, MD   1 drop at 02/26/24 0036   metoprolol  succinate (TOPROL -XL) 24 hr tablet 25 mg  25 mg Oral Daily Nicholaus Maize E, MD   25 mg at 02/26/24 1036   mirtazapine  (REMERON ) tablet 30 mg  30 mg Oral QHS Nicholaus Maize E, MD   30 mg at 02/26/24 0040   potassium chloride  (KLOR-CON ) packet 20 mEq  20 mEq Oral Daily Claudene Rover, MD   20 mEq at 02/26/24 1038   pravastatin  (PRAVACHOL ) tablet 10 mg  10 mg Oral QPM Nicholaus Maize BRAVO, MD   10 mg at 02/25/24 1832   torsemide  (DEMADEX ) tablet 20 mg  20 mg Oral Daily Nicholaus Maize E, MD   20 mg at 02/26/24 1039   Current Outpatient  Medications  Medication Sig Dispense Refill   aspirin  EC 81 MG tablet Take 1 tablet (81 mg total) by mouth daily. Swallow whole. 30 tablet 2   Dorzolamide  HCl-Timolol  Mal PF 2-0.5 % SOLN Place 1 drop into the left eye 2 (two) times daily.     isosorbide  mononitrate (IMDUR ) 30 MG 24 hr tablet Take 1 tablet (30 mg total) by mouth daily. 30 tablet 2   lamoTRIgine  (LAMICTAL ) 25 MG tablet Take 2 tablets (50 mg total) by mouth 2 (two) times daily. (Patient taking differently: Take 75 mg by mouth 2 (two) times daily.) 120 tablet 2   latanoprost  (XALATAN ) 0.005 % ophthalmic solution Place 1 drop into both eyes at bedtime. 1.5 mL 11   LORazepam  (ATIVAN ) 0.5 MG tablet Take 0.5 mg by mouth every 6 (six) hours as needed for anxiety.     metoprolol  succinate (TOPROL -XL) 25 MG 24 hr tablet Take 1 tablet (25 mg total) by mouth daily. 30 tablet 2   mirtazapine  (REMERON ) 15 MG tablet Take 1 tablet (15 mg total) by mouth at bedtime. (Patient taking differently: Take 30 mg by mouth at bedtime.) 30 tablet 0   potassium chloride  (KLOR-CON  M) 10 MEQ tablet Take 1 tablet (10 mEq total) by mouth 2 (two) times daily. 60 tablet 2   pravastatin  (PRAVACHOL ) 10 MG tablet Take 1 tablet (10 mg total) by mouth every evening. 30 tablet 2   QUEtiapine  (SEROQUEL ) 25 MG tablet Take 1 tablet (25 mg total) by mouth daily in the afternoon. 3 pm 30 tablet 1   QUEtiapine  (SEROQUEL ) 50 MG tablet Take 1 tablet (50 mg total) by mouth 2 (two) times daily. 60 tablet 1   torsemide  (DEMADEX ) 20 MG tablet Take 1 tablet (20 mg total) by mouth daily. 30 tablet 2   potassium chloride  (KLOR-CON ) 10 MEQ tablet Take 10 mEq by mouth 2 (two) times daily.       Discharge Medications: Please see discharge summary for a list of discharge medications.  Relevant Imaging Results:  Relevant Lab Results:   Additional Information SS#: [id number]. DSS is her legal guardian. Was previously in an ALF but they can no longer meet her needs. Looking for LTC  SNF. DSS has started LTC Medicaid application. Does not need rehab per PT.  [name], LCSW

## 2024-02-26 NOTE — Progress Notes (Signed)
 Physical Therapy Treatment Patient Details Name: Penny Hart MRN: [id number] DOB: 1945/10/07 Today's Date: 02/26/2024   History of Present Illness Pt is a 78 y.o. female with PMH that includes: CAD, COPD, type 2 diabetes, hypertension, diverticulosis, and dementia who presents with apparent aggressive behavior at her facility.  She relies on an IVC initiated by police.  Per report, patient was experiencing delusions of grandour stating that she owned the entire skilled nursing facility and when this was disagreed with threatened to kill other residents and herself.  MD assessment inlcudes homicidal ideation, dementia, and agitation.    PT Comments  Pt tolerated treatment well today and is grossly independent - supervision for mobility secondary to baseline visual impairment. Due to visual impairment she requires minimal cues for obstacle negotiation in an unfamiliar environment; overhead lights turned on to improve vision on the L. She has made adequate progress towards goals that is appropriate for DC from acute PT services. Pt appropriate for continued OOB mobility with nursing and mobility specialist with RW for maintenance of independence with mobility. PT to sign off. Please re-consult as appropriate.   If plan is discharge home, recommend the following: Supervision due to cognitive status;Assist for transportation   Can travel by private vehicle      Yes  Equipment Recommendations  None recommended by PT       Precautions / Restrictions Precautions Precautions: Fall Restrictions Weight Bearing Restrictions Per Provider Order: No     Mobility  Bed Mobility               General bed mobility comments: mod I to exit bed to the L, use of BUE for support    Transfers     Transfers: Sit to/from Stand Sit to Stand: Supervision           General transfer comment: IND to stand from EOB without AD. Supervision for safety to sit in recliner with RW due to visual  impairments, requiring cues for guidance of proper positioning. Demonstrates good eccentric lowering with proper hand placement.    Ambulation/Gait Ambulation/Gait assistance: Supervision Gait Distance (Feet): 120 Feet           General Gait Details: Amb in room at supervision level for safety with RW. Minimal cueing for obstacle negotiation secondary to visual impairments and to roll RW on the ground. Demonstrates early reciprocal gait with decreased step length/foot clearance bilaterally.    Balance Overall balance assessment: Needs assistance Sitting-balance support: No upper extremity supported, Feet supported Sitting balance-Leahy Scale: Good     Standing balance support: During functional activity, Bilateral upper extremity supported Standing balance-Leahy Scale: Fair Standing balance comment: secondary to visual impairments, UE support on RW, able to balance without UE on RW                            Communication Communication Communication: Impaired;Other (comment) Factors Affecting Communication: Other (comment) (visually impaired)  Cognition Arousal: Alert Behavior During Therapy: WFL for tasks assessed/performed   PT - Cognitive impairments: History of cognitive impairments                       PT - Cognition Comments: Pt is alert but disoriented, follows commands and is cooperative and pleasant Following commands: Intact Following commands impaired: Follows one step commands with increased time, Follows multi-step commands inconsistently    Cueing Cueing Techniques: Verbal cues, Tactile cues, Visual cues  Exercises  Other Exercises Other Exercises: Pt educated re: PT role/POC, safety with use of AD, call for help, OOB to chair for meals and toileting.    General Comments General comments (skin integrity, edema, etc.): lunch tray set up in recliner with chair alarm on      Pertinent Vitals/Pain Pain Assessment Breathing:  normal Negative Vocalization: none Facial Expression: smiling or inexpressive Body Language: relaxed Consolability: no need to console PAINAD Score: 0     PT Goals (current goals can now be found in the care plan section) Progress towards PT goals: Goals met/education completed, patient discharged from PT    Frequency     (DC from acute PT services)       AM-PAC PT 6 Clicks Mobility   Outcome Measure  Help needed turning from your back to your side while in a flat bed without using bedrails?: None Help needed moving from lying on your back to sitting on the side of a flat bed without using bedrails?: None Help needed moving to and from a bed to a chair (including a wheelchair)?: A Little Help needed standing up from a chair using your arms (e.g., wheelchair or bedside chair)?: A Little Help needed to walk in hospital room?: A Little Help needed climbing 3-5 steps with a railing? : A Little 6 Click Score: 20    End of Session   Activity Tolerance: Patient tolerated treatment well Patient left: with call bell/phone within reach;in chair;with chair alarm set Nurse Communication: Mobility status PT Visit Diagnosis: Difficulty in walking, not elsewhere classified (R26.2)     Time: 8772-8759 PT Time Calculation (min) (ACUTE ONLY): 13 min  Charges:    $Therapeutic Activity: 8-22 mins PT General Charges $$ ACUTE PT VISIT: 1 Visit                      Penny Hart, PT, DPT 1:01 PM,02/26/24 Physical Therapist - [facility]

## 2024-02-26 NOTE — ED Notes (Signed)
 Pt ambulatory to recliner and room, and given breakfast tray.

## 2024-02-26 NOTE — ED Notes (Signed)
Pt was provided with a lunch tray.

## 2024-02-26 NOTE — TOC CM/SW Note (Signed)
..  Transition of Care [facility]) - Inpatient Brief Assessment   Patient Details  Name: Penny Hart MRN: [id number] Date of Birth: 06/21/1946  Transition of Care [facility]) CM/SW Contact:    [name], LCSW Phone Number: 02/26/2024, 6:40 PM   Clinical Narrative:  SW spoke with [name] (DSS).  [name] stated that she is trying to place pt at [facility] [place] [facility] under Mr. Rosana.  Updated FL2 is needed.  [name] expressed that she is contacting medicaid to get pt medicaid changed to LTC.  SW asked [name] to cc SW on email to Medicaid since [facility] is needed to change Medicaid to LTC and SW will then forward FL2 to SW and Medicaid.  [name] expressed that she will do so when she returns to the office.   Transition of Care Asessment:

## 2024-02-26 NOTE — TOC CM/SW Note (Addendum)
..  Transition of Care [facility]) - Inpatient Brief Assessment   Patient Details  Name: Penny Hart MRN: [id number] Date of Birth: 1945-10-29  Transition of [facility]) CM/SW Contact:    [name], LCSW Phone Number: 02/26/2024, 12:02 PM   Clinical Narrative:  SW contacted [name] at DSS at [phone].  SW not able to leave message, mailbox full  12:03pm- Supervisor, Francine,secured email to [employer] staff [employer], supervisor, Jessica Lent) along with [facility] Staff ([place] and [place]) to schedule a weekly meeting regarding pt and placement.   Transition of Care Asessment:

## 2024-02-26 NOTE — ED Provider Notes (Signed)
-----------------------------------------   5:14 AM on 02/26/2024 -----------------------------------------   Blood pressure (!) 145/69, pulse (!) 58, temperature 98 F (36.7 C), resp. rate 15, height 5' 4 (1.626 m), weight 77.1 kg, SpO2 99%.  The patient is calm and cooperative at this time.  There have been no acute events since the last update.  Awaiting disposition plan from case management/social work.    Oaklee Sunga, Josette SAILOR, DO 02/26/24 [phone]

## 2024-02-27 DIAGNOSIS — F0390 Unspecified dementia without behavioral disturbance: Secondary | ICD-10-CM | POA: Diagnosis not present

## 2024-02-27 LAB — BASIC METABOLIC PANEL WITH GFR
Anion gap: 10 (ref 5–15)
BUN: 14 mg/dL (ref 8–23)
CO2: 25 mmol/L (ref 22–32)
Calcium: 9.4 mg/dL (ref 8.9–10.3)
Chloride: 103 mmol/L (ref 98–111)
Creatinine, Ser: 0.91 mg/dL (ref 0.44–1.00)
GFR, Estimated: >60 mL/min (ref >60)
Glucose, Bld: 99 mg/dL (ref 70–99)
Potassium: 3.6 mmol/L (ref 3.5–5.1)
Sodium: 138 mmol/L (ref 135–145)

## 2024-02-27 MED ORDER — HALOPERIDOL LACTATE 5 MG/ML IJ SOLN
2.0000 mg | Freq: Once | INTRAMUSCULAR | Status: AC
  Administered 2024-02-27: 2 mg via INTRAMUSCULAR
  Filled 2024-02-27: qty 1

## 2024-02-27 MED ORDER — QUETIAPINE FUMARATE 25 MG PO TABS
25.0000 mg | ORAL_TABLET | Freq: Every day | ORAL | Status: DC
  Administered 2024-02-28 – 2024-03-05 (×9): 25 mg via ORAL
  Filled 2024-02-27 (×6): qty 1

## 2024-02-27 MED ORDER — LAMOTRIGINE 25 MG PO TABS
75.0000 mg | ORAL_TABLET | Freq: Two times a day (BID) | ORAL | Status: DC
  Administered 2024-02-27 – 2024-03-05 (×16): 75 mg via ORAL
  Filled 2024-02-27 (×12): qty 3

## 2024-02-27 MED ORDER — QUETIAPINE FUMARATE 25 MG PO TABS
50.0000 mg | ORAL_TABLET | Freq: Two times a day (BID) | ORAL | Status: DC
  Administered 2024-02-28 – 2024-03-05 (×16): 50 mg via ORAL
  Filled 2024-02-27 (×12): qty 2

## 2024-02-27 NOTE — ED Notes (Signed)
 Pt up to turn off TV. TV turned off.

## 2024-02-27 NOTE — ED Provider Notes (Signed)
 Restarted Seroquel , will hold until tomorrow given recent Haldol    Arlander Charleston, MD 02/27/24 1009

## 2024-02-27 NOTE — ED Notes (Signed)
 Pt up. Responded to call bell. Pt assisted back to bed.

## 2024-02-27 NOTE — ED Notes (Signed)
 Pt in hallway to address pt snoring loudly. Pt redirected to room and assisted back into bed. TV turned on to provide calming music and drown out hall noise. Bed fall alarm turned on and door cracked.

## 2024-02-27 NOTE — ED Provider Notes (Signed)
 Emergency Medicine Observation Re-evaluation Note  Penny Hart is a 78 y.o. female, seen on rounds today.  Pt initially presented to the ED for complaints of Agitation Currently, the patient is: Sleeping I did not awake  Physical Exam  BP 109/78 (BP Location: Right Arm)   Pulse 77   Temp 98 F (36.7 C) (Oral)   Resp 16   Ht 5' 4 (1.626 m)   Wt 77.1 kg   SpO2 100%   BMI 29.18 kg/m  Physical Exam General: sleeping Cardiac: RR Lungs: CTAB Psych: Did not assess  ED Course / MDM  EKG:EKG Interpretation Date/Time:  Sunday February 18 2024 13:27:25 EDT Ventricular Rate:  52 PR Interval:  178 QRS Duration:  78 QT Interval:  454 QTC Calculation: 422 R Axis:   24  Text Interpretation: Sinus bradycardia T wave abnormality, consider inferior ischemia T wave abnormality, consider anterior ischemia Abnormal ECG When compared with ECG of 14-Feb-2024 02:16, PREVIOUS ECG IS PRESENT Confirmed by UNCONFIRMED, DOCTOR ([id number]), editor Katheryn Males ([id number]) on 02/19/2024 1:29:05 PM  I have reviewed the labs performed to date as well as medications administered while in observation.  Recent changes in the last 24 hours include patient required a dose of Haldol  this morning and consequently Seroquel  was held with plans to reinitiate later  Plan  Current plan is for: Penny Nicholaus Rolland FORBES, MD 02/27/24 1500

## 2024-02-27 NOTE — ED Notes (Signed)
 Pt breakfast provided at bedside

## 2024-02-27 NOTE — Consult Note (Signed)
 [facility] Psychiatric Consult Initial  Patient Name: .Penny Hart  MRN: [id number]  DOB: 01-01-1946  Consult Order details:  Orders (From admission, onward)     Start     Ordered   02/18/24 1445  IP CONSULT TO PSYCHIATRY       Ordering Provider: Nicholaus Rolland BRAVO, MD  Provider:  (Not yet assigned)  Question Answer Comment  Place call to: [phone]   Reason for Consult Admit   Diagnosis/Clinical Info for Consult: Geriatric psych, SI/HI, likely worsening dementia      02/18/24 1444   02/18/24 1220  CONSULT TO CALL ACT TEAM       Ordering Provider: Nicholaus Rolland BRAVO, MD  Provider:  (Not yet assigned)  Question:  Reason for Consult?  Answer:  Psych consult   02/18/24 1220             Mode of Visit: In person    Psychiatry Consult Evaluation  Service Date: February 27, 2024 LOS:  LOS: 0 days  Chief Complaint aggressive behaviors  Primary Psychiatric Diagnoses  dementia   Assessment  Penny Hart is a 78 y.o. female admitted: Presented to the ED   Penny Hart is a 78 y.o. female  CAD, COPD, type 2 diabetes, hypertension, diverticulosis, and dementia who presents with apparent aggressive behavior at her facility.  She relies on an IVC initiated by police.  Per report, patient was experiencing delusions of grandour stating that she owned the entire skilled nursing facility and when this was disagreed with threatened to kill other residents and herself.  She cannot be verbally redirected and she was subsequently sent here.    02/27/2024: Patient is noted to be calm and cooperative.  She is unable to answer the orientation questions.  She is unable to acknowledge the current situation and the reason why she is still in the emergency room.  Increase the Lamictal  dosing and will monitor the dosage requirement for Seroquel  moving forward.  Patient's agitation is in the context of dementia and possible delirium intermittently in the context of sensory deprivation being in the  emergency room as she is in a closed room with no windows no clock, unable to walk around.  TOC is working on appropriate placement of the patient   Diagnoses:  Active Hospital problems: Active Problems:   * No active hospital problems. *    Plan   ## Psychiatric Medication Recommendations: Her agitation is in the context of dementia and the current environment of being stationed in a close-up room with no windows, no time for clock in the room. Continue Seroquel  50 mg twice daily and 25 mg daily.  Will monitor for orthostatic hypotension.  Increase Lamictal  to 75 mg twice daily for further mood stabilization.  ## Medical Decision Making Capacity: Patient has a guardian and has thus been adjudicated incompetent; please involve patients guardian in medical decision making  ## Further Work-up:   -- most recent EKG on 02/16/2024 had QtC of 422 -- Pertinent labwork reviewed earlier this admission includes: CBC, CMP   ## Disposition:--No psychiatric contraindication for discharge  ## Behavioral / Environmental: -Utilize compassion and acknowledge the patient's experiences while setting clear and realistic expectations for care.    ## Safety and Observation Level:  - Based on my clinical evaluation, I estimate the patient to be at low risk of self harm in the current setting. - At this time, we recommend  routine. This decision is based on my review of the  chart including patient's history and current presentation, interview of the patient, mental status examination, and consideration of suicide risk including evaluating suicidal ideation, plan, intent, suicidal or self-harm behaviors, risk factors, and protective factors. This judgment is based on our ability to directly address suicide risk, implement suicide prevention strategies, and develop a safety plan while the patient is in the clinical setting. Please contact our team if there is a concern that risk level has changed.  CSSR Risk  Category:C-SSRS RISK CATEGORY: No Risk  Suicide Risk Assessment: Patient has following modifiable risk factors for suicide: There is no risk of suicide Patient has following non-modifiable or demographic risk factors for suicide: no identified Patient has the following protective factors against suicide: Supportive family  Thank you for this consult request. Recommendations have been communicated to the primary team.  We will follow-up as needed basis at this time.   Allyn Foil, MD       History of Present Illness  Relevant Aspects of Hospital ED   02/27/24: Patient is noted to be resting.  Patient responded to verbal commands and participated in the interview.  She is noted to be picking on the blanket and trying to get off the stretcher confused.  She is unable to answer any of the orientation notions regarding to the current location or date or president but is able to tell her date of birth but is unable to come up with her age depending on her date of birth.  She denies SI/HI/plan and denies hallucinations.  Per chart patient received Haldol  as needed for confusion and agitation.   Psychiatric and Social History  Psychiatric History:  Information collected from patient and chart  Prev Dx/Sx: denies Current Psych Provider: denies Home Meds (current): See chart Previous Med Trials: denies Therapy: denies  Prior Psych Hospitalization: denies  Prior Self Harm: denies Prior Violence: denies  Family Psych History: denies Family Hx suicide: denies  Social History:   Educational Hx: unknown Occupational Hx: retired Armed forces operational officer Hx: unknown Living Situation: SNF Spiritual Hx: unknown Access to weapons/lethal means: none   Substance History Alcohol: unknown  Type of alcohol unknown Last Drink unknown Number of drinks per day unknown History of alcohol withdrawal seizures unknown History of DT's unknown Tobacco: unknown Illicit drugs: unknown Prescription drug abuse:  unknown Rehab hx: unknown  Exam Findings  Physical Exam: I agree with the initial exam Vital Signs:  Temp:  [98 F (36.7 C)] 98 F (36.7 C) (08/04 2205) Pulse Rate:  [77] 77 (08/04 2205) Resp:  [16] 16 (08/04 2205) BP: (109)/(78) 109/78 (08/04 2205) SpO2:  [100 %] 100 % (08/04 2205) Blood pressure 109/78, pulse 77, temperature 98 F (36.7 C), temperature source Oral, resp. rate 16, height 5' 4 (1.626 m), weight 77.1 kg, SpO2 100%. Body mass index is 29.18 kg/m.    Mental Status Exam: General Appearance: Neat  Orientation:  Other:  A&Ox1  Memory:  Immediate;   Poor Recent;   Poor Remote;   Poor  Concentration:  Concentration: Poor and Attention Span: Poor  Recall:  Poor  Attention  Fair  Eye Contact:  Good  Speech:  Normal Rate  Language:  Good  Volume:  Normal  Mood: fine  Affect:  Restricted  Thought Process:  Coherent  Thought Content:  Negative  Suicidal Thoughts:  No  Homicidal Thoughts:  No  Judgement:  Impaired  Insight:  impaired  Psychomotor Activity:  Normal  Akathisia:  No  Fund of Knowledge:  Poor  Assets:  Communication Skills  Cognition:  Impaired,  Moderate  ADL's:  Impaired  AIMS (if indicated):        Other History   These have been pulled in through the EMR, reviewed, and updated if appropriate.  Family History:  The patient's family history includes Diabetes in her mother.  Medical History: Past Medical History:  Diagnosis Date   Arthritis    CHF (congestive heart failure) (HCC)    COPD (chronic obstructive pulmonary disease) (HCC)    Coronary artery disease    Dementia (HCC)    Diabetes mellitus without complication (HCC)    GERD (gastroesophageal reflux disease)    Glaucoma    Headache    Hiatal hernia    Hyperlipidemia    Hypertension    Neuropathy    Obesity    Peripheral neuropathy    Pseudophakia    Sleep apnea    Spinal stenosis    Spondylolisthesis of lumbar region    Thoracic aortic atherosclerosis [facility])      Surgical History: Past Surgical History:  Procedure Laterality Date   ABDOMINAL HYSTERECTOMY     BACK SURGERY  2019   [facility]   BREAST EXCISIONAL BIOPSY Left yrs ago    benign   CATARACT EXTRACTION W/ INTRAOCULAR LENS  IMPLANT, BILATERAL     COLONOSCOPY N/A 06/01/2020   Procedure: COLONOSCOPY;  Surgeon: Maryruth Ole DASEN, MD;  Location: [facility] ENDOSCOPY;  Service: Endoscopy;  Laterality: N/A;   COLONOSCOPY WITH PROPOFOL  N/A 05/30/2015   Procedure: COLONOSCOPY WITH PROPOFOL ;  Surgeon: Rogelia Copping, MD;  Location: [facility] ENDOSCOPY;  Service: Endoscopy;  Laterality: N/A;   ESOPHAGOGASTRODUODENOSCOPY N/A 06/01/2020   Procedure: ESOPHAGOGASTRODUODENOSCOPY (EGD);  Surgeon: Maryruth Ole DASEN, MD;  Location: [facility] ENDOSCOPY;  Service: Endoscopy;  Laterality: N/A;   ESOPHAGOGASTRODUODENOSCOPY (EGD) WITH PROPOFOL  N/A 05/30/2015   Procedure: ESOPHAGOGASTRODUODENOSCOPY (EGD) WITH PROPOFOL ;  Surgeon: Rogelia Copping, MD;  Location: [facility] ENDOSCOPY;  Service: Endoscopy;  Laterality: N/A;   EYE SURGERY     Bialteral for glaucoma   Eye surgery for glaucoma     JOINT REPLACEMENT     Left   LEFT HEART CATH AND CORONARY ANGIOGRAPHY Left 01/30/2020   Procedure: LEFT HEART CATH AND CORONARY ANGIOGRAPHY;  Surgeon: Florencio Cara BIRCH, MD;  Location: [facility] INVASIVE CV LAB;  Service: Cardiovascular;  Laterality: Left;   Left total knee replacement     OOPHORECTOMY     PELVIC LAPAROSCOPY     POSTERIOR LUMBAR FUSION  04/27/2017   TUBAL LIGATION       Medications:   Current Facility-Administered Medications:    aspirin  EC tablet 81 mg, 81 mg, Oral, Daily, Bradler, Evan K, MD, 81 mg at 02/27/24 1024   dorzolamide -timolol  (COSOPT ) 2-0.5 % ophthalmic solution 1 drop, 1 drop, Left Eye, BID, Nicholaus Maize E, MD, 1 drop at 02/27/24 1024   isosorbide  mononitrate (IMDUR ) 24 hr tablet 30 mg, 30 mg, Oral, Daily, Nicholaus Maize E, MD, 30 mg at 02/27/24 1024   lamoTRIgine  (LAMICTAL ) tablet 75 mg, 75 mg, Oral, BID,  Garreth Burnsworth, MD   latanoprost  (XALATAN ) 0.005 % ophthalmic solution 1 drop, 1 drop, Both Eyes, QHS, Davis, Hillary E, MD, 1 drop at 02/26/24 2122   metoprolol  succinate (TOPROL -XL) 24 hr tablet 25 mg, 25 mg, Oral, Daily, Nicholaus Maize E, MD, 25 mg at 02/27/24 1024   mirtazapine  (REMERON ) tablet 30 mg, 30 mg, Oral, QHS, Nicholaus Maize E, MD, 30 mg at 02/26/24 2113   potassium chloride  (KLOR-CON ) packet 20 mEq,  20 mEq, Oral, Daily, Claudene Rover, MD, 20 mEq at 02/27/24 1024   pravastatin  (PRAVACHOL ) tablet 10 mg, 10 mg, Oral, QPM, Nicholaus Maize E, MD, 10 mg at 02/27/24 1809   [START ON 02/28/2024] QUEtiapine  (SEROQUEL ) tablet 25 mg, 25 mg, Oral, Q1500, Kinner, Robert, MD   NOREEN ON 02/28/2024] QUEtiapine  (SEROQUEL ) tablet 50 mg, 50 mg, Oral, BID, Kinner, Robert, MD   torsemide  (DEMADEX ) tablet 20 mg, 20 mg, Oral, Daily, Nicholaus Maize E, MD, 20 mg at 02/27/24 1024  Current Outpatient Medications:    aspirin  EC 81 MG tablet, Take 1 tablet (81 mg total) by mouth daily. Swallow whole., Disp: 30 tablet, Rfl: 2   Dorzolamide  HCl-Timolol  Mal PF 2-0.5 % SOLN, Place 1 drop into the left eye 2 (two) times daily., Disp: , Rfl:    isosorbide  mononitrate (IMDUR ) 30 MG 24 hr tablet, Take 1 tablet (30 mg total) by mouth daily., Disp: 30 tablet, Rfl: 2   lamoTRIgine  (LAMICTAL ) 25 MG tablet, Take 2 tablets (50 mg total) by mouth 2 (two) times daily. (Patient taking differently: Take 75 mg by mouth 2 (two) times daily.), Disp: 120 tablet, Rfl: 2   latanoprost  (XALATAN ) 0.005 % ophthalmic solution, Place 1 drop into both eyes at bedtime., Disp: 1.5 mL, Rfl: 11   LORazepam  (ATIVAN ) 0.5 MG tablet, Take 0.5 mg by mouth every 6 (six) hours as needed for anxiety., Disp: , Rfl:    metoprolol  succinate (TOPROL -XL) 25 MG 24 hr tablet, Take 1 tablet (25 mg total) by mouth daily., Disp: 30 tablet, Rfl: 2   mirtazapine  (REMERON ) 15 MG tablet, Take 1 tablet (15 mg total) by mouth at bedtime. (Patient taking differently: Take 30  mg by mouth at bedtime.), Disp: 30 tablet, Rfl: 0   potassium chloride  (KLOR-CON  M) 10 MEQ tablet, Take 1 tablet (10 mEq total) by mouth 2 (two) times daily., Disp: 60 tablet, Rfl: 2   pravastatin  (PRAVACHOL ) 10 MG tablet, Take 1 tablet (10 mg total) by mouth every evening., Disp: 30 tablet, Rfl: 2   QUEtiapine  (SEROQUEL ) 25 MG tablet, Take 1 tablet (25 mg total) by mouth daily in the afternoon. 3 pm, Disp: 30 tablet, Rfl: 1   QUEtiapine  (SEROQUEL ) 50 MG tablet, Take 1 tablet (50 mg total) by mouth 2 (two) times daily., Disp: 60 tablet, Rfl: 1   torsemide  (DEMADEX ) 20 MG tablet, Take 1 tablet (20 mg total) by mouth daily., Disp: 30 tablet, Rfl: 2   potassium chloride  (KLOR-CON ) 10 MEQ tablet, Take 10 mEq by mouth 2 (two) times daily., Disp: , Rfl:   Allergies: Allergies  Allergen Reactions   Oxycodone -Acetaminophen  Rash    UNSPECIFIED REACTION     Tramadol Rash and Other (See Comments)    INTOLERANCE > Chest pain Other reaction(s): Other (See Comments) Chest pain    Allyn Foil, MD

## 2024-02-27 NOTE — ED Notes (Signed)
Patient is sleeping at this time.

## 2024-02-27 NOTE — Progress Notes (Signed)
 OT Cancellation Note  Patient Details Name: ARLETT GOOLD MRN: [id number] DOB: 1946-07-04   Cancelled Treatment:    Reason Eval/Treat Not Completed: Fatigue/lethargy limiting ability to participate. Chart reviewed. On arrival NT reports pt did not sleep well and recently received haldol , will hold and re-attempt as pt able to participate.   Elston Slot, M.S. OTR/L  02/27/24, 10:59 AM  ascom [phone]

## 2024-02-27 NOTE — ED Notes (Signed)
 Pt out of be and walking towards nursing station despite sitters efforts to redirect her back to room. Pt yelling and refusing to cooperate with staff. Pt brought back to room by EDT Robyne Search, and RN Granby. Pt extremely agitated, cursing at staff and refusing to sit down in bed or chair. MD Ray notified of pt's agitation.

## 2024-02-27 NOTE — ED Provider Notes (Signed)
 Patient increasingly agitated, unable to be verbally redirected. Ordered for 2mg  IM Haldol .  Reviewed patient's MAR, note that she does not have any antipsychotic medications ordered.  Consult note from 7/7 recommending Seroquel  75 BID, also notes consideration of 20 mg PO BID. do think patient requires IM medication given her inability to currently cooperate, but likely needs reinitiation of her antipsychotic.    Levander Slate, MD 02/27/24 [phone]

## 2024-02-27 NOTE — ED Notes (Signed)
 Pt's daughter at bedside for a visit.

## 2024-02-27 NOTE — ED Notes (Addendum)
 Responded to fall alarm. Pt out of bed again. Restless and walking around room. Pt has not slept at all throughout night shift but remains pleasant and redirectable.

## 2024-02-27 NOTE — ED Notes (Addendum)
 Pt in doorway to address noise in hallway. Pt pleasant and redirected to bed. Denies need for toileting.

## 2024-02-27 NOTE — ED Notes (Signed)
 Pt Lunch provided at bedside

## 2024-02-28 NOTE — ED Notes (Addendum)
 Pt ambulated to the restroom with standby assistance. Pt given a modified bed bath at this time. Pt states she isnt hungry at this time, lunch meal tray placed at bedside.

## 2024-02-28 NOTE — ED Provider Notes (Signed)
 Emergency Medicine Observation Re-evaluation Note  Penny Hart is a 78 y.o. female, seen on rounds today.  Pt initially presented to the ED for complaints of Agitation  Currently, the patient is resting in bed. No reported issues from nursing team.   Physical Exam  BP 112/63 (BP Location: Right Arm)   Pulse (!) 47   Temp 98.2 F (36.8 C) (Oral)   Resp 17   Ht 5' 4 (1.626 m)   Wt 77.1 kg   SpO2 98%   BMI 29.18 kg/m  Physical Exam General: Resting in bed  ED Course / MDM   BMP performed yesterday without significant derangement.  Patient reevaluated by psychiatry, ordered for Seroquel  50 mg twice daily and 25 mg daily and Lamictal  increased to 75 mg twice daily.  Remains cleared for discharge per psychiatry.  Plan  Current plan is for dispo per social work.    Levander Slate, MD 02/28/24 [phone]

## 2024-02-28 NOTE — TOC CM/SW Note (Signed)
.  Transition of Care [facility]) - Inpatient Brief Assessment   Patient Details  Name: Penny Hart MRN: [id number] Date of Birth: 1946-01-16  Transition of Care [facility]) CM/SW Contact:    [name], LCSW Phone Number: 02/28/2024, 6:37 PM   Clinical Narrative: SW emailed DSS ([place] and [place]) regarding potential placement to [address]  SW also securely emailed fl2 to DSS ([place] and [place])  and Medicaid LTC staff for [facility] L[facility] application process.    Transition of Care Asessment:

## 2024-02-28 NOTE — ED Notes (Signed)
 Dinner meal tray given to pt at this time, pt repositioned in the bed so that she could eat more comfortably.

## 2024-02-28 NOTE — Progress Notes (Signed)
 OT Cancellation Note  Patient Details Name: Penny Hart MRN: [id number] DOB: 03-21-1946   Cancelled Treatment:    Reason Eval/Treat Not Completed: Fatigue/lethargy limiting ability to participate. Chart reviewed, on arrival pt sleeping, awakes to voice and reports fatigue + feeling too cold to get OOB. Will hold and re-attempt as available.   Elston Slot, M.S. OTR/L  02/28/24, 2:52 PM  ascom [phone]

## 2024-02-29 NOTE — ED Notes (Signed)
Pt ambulatory to toilet

## 2024-02-29 NOTE — ED Notes (Addendum)
 Pt resting at this time.

## 2024-02-29 NOTE — ED Notes (Signed)
 VOL  TOC  PLACEMENT

## 2024-02-29 NOTE — ED Notes (Signed)
 Pt given a breakfast tray

## 2024-02-29 NOTE — ED Notes (Signed)
VOL TOC placement 

## 2024-02-29 NOTE — Progress Notes (Signed)
 Eval for intoxication as cause for agitation

## 2024-02-29 NOTE — ED Notes (Signed)
 Staff took patient to the bathroom.

## 2024-02-29 NOTE — Progress Notes (Signed)
 Occupational Therapy Treatment Patient Details Name: Penny Hart MRN: [id number] DOB: June 06, 1946 Today's Date: 02/29/2024   History of present illness Pt is a 78 y.o. female with PMH that includes: CAD, COPD, type 2 diabetes, hypertension, diverticulosis, and dementia who presents with apparent aggressive behavior at her facility.  She relies on an IVC initiated by police.  Per report, patient was experiencing delusions of grandour stating that she owned the entire skilled nursing facility and when this was disagreed with threatened to kill other residents and herself.  MD assessment inlcudes homicidal ideation, dementia, and agitation.   OT comments  Pt seen for OT treatment on this date. Upon arrival to room resting in bed, easily wakes to voices, agreeable to tx. Pt completes bed mobility with MODI, pt amb to toilet with RW initially quickly moves RW aside and using +1 HHA. Pt voided and completed pericare with lateral leans MINA for cues due to visual impairment. Pt returned to bed with +1HHA after completing sink level pericare with heavy cues for locating objects. Pt making good progress toward goals, will continue to follow POC. Discharge recommendation remains appropriate.        If plan is discharge home, recommend the following:  A little help with walking and/or transfers;A little help with bathing/dressing/bathroom;Assistance with feeding;Assistance with cooking/housework;Direct supervision/assist for medications management;Direct supervision/assist for financial management;Assist for transportation;Help with stairs or ramp for entrance;Supervision due to cognitive status   Equipment Recommendations  None recommended by OT    Recommendations for Other Services      Precautions / Restrictions Precautions Precautions: Fall Recall of Precautions/Restrictions: Impaired Restrictions Weight Bearing Restrictions Per Provider Order: No       Mobility Bed Mobility Overal bed  mobility: Modified Independent             General bed mobility comments: Completes with no physical assistance, gets into bed knees first    Transfers Overall transfer level: Needs assistance Equipment used: Rolling walker (2 wheels), None Transfers: Sit to/from Stand Sit to Stand: Supervision           General transfer comment: STS with supervision from ED stretcher, CGA STS from elevated toilet seat     Balance Overall balance assessment: Needs assistance Sitting-balance support: No upper extremity supported, Feet supported Sitting balance-Leahy Scale: Good Sitting balance - Comments: Steady reaching outside BOS during LB dressing   Standing balance support: During functional activity, Bilateral upper extremity supported Standing balance-Leahy Scale: Fair Standing balance comment: secondary to visual impairments, UE support on RW, able to balance without UE on RW, pt often leaves RW behind                           ADL either performed or assessed with clinical judgement   ADL Overall ADL's : Needs assistance/impaired Eating/Feeding: Set up;Bed level   Grooming: Wash/dry face;Wash/dry hands;Standing               Lower Body Dressing: Supervision/safety;Bed level Lower Body Dressing Details (indicate cue type and reason): doffing socks at bed level Toilet Transfer: Ambulation;Grab bars;Contact guard assist   Toileting- Clothing Manipulation and Hygiene: Contact guard assist;Sitting/lateral lean Toileting - Clothing Manipulation Details (indicate cue type and reason): Verbal/tactile cues required for task completion     Functional mobility during ADLs: Supervision/safety;Rolling walker (2 wheels);Contact guard assist General ADL Comments: sink level grooming and toileting with multimodal cues due to visual impairments     Communication Communication Communication:  Impaired;Other (comment) Factors Affecting Communication: Other (comment)    Cognition Arousal: Alert Behavior During Therapy: WFL for tasks assessed/performed Cognition: History of cognitive impairments             OT - Cognition Comments: Pt aware she is somewhere with nurses, calm and cooperative throughout session                 Following commands: Intact Following commands impaired: Follows one step commands with increased time, Follows multi-step commands inconsistently      Cueing   Cueing Techniques: Verbal cues, Tactile cues, Visual cues  Exercises Exercises: Other exercises Other Exercises Other Exercises: Edu: Safe ADL completion, DME management, cues throughout for visual impairment    Shoulder Instructions       General Comments Pt eager to return to bed post toileting and grooming at the sink    Pertinent Vitals/ Pain       Pain Assessment Pain Assessment: PAINAD Breathing: normal Negative Vocalization: none Facial Expression: smiling or inexpressive Body Language: relaxed Consolability: no need to console PAINAD Score: 0  Home Living Family/patient expects to be discharged to:: Assisted living                             Home Equipment: [place] - single point   Additional Comments: from [place] [facility] memory care unit                    Frequency  Min 2X/week        Progress Toward Goals  OT Goals(current goals can now be found in the care plan section)  Progress towards OT goals: Progressing toward goals  Acute Rehab OT Goals OT Goal Formulation: Patient unable to participate in goal setting Time For Goal Achievement: 03/04/24 Potential to Achieve Goals: Fair ADL Goals Pt Will Perform Grooming: with supervision;standing Pt Will Perform Upper Body Dressing: with supervision;sitting Pt Will Perform Lower Body Dressing: with supervision;sitting/lateral leans;sit to/from stand Pt Will Transfer to Toilet: with supervision;ambulating Pt Will Perform Toileting - Clothing Manipulation and  hygiene: with supervision;sitting/lateral leans;sit to/from stand   AM-PAC OT 6 Clicks Daily Activity     Outcome Measure   Help from another person eating meals?: A Little Help from another person taking care of personal grooming?: A Little Help from another person toileting, which includes using toliet, bedpan, or urinal?: A Little Help from another person bathing (including washing, rinsing, drying)?: A Little Help from another person to put on and taking off regular upper body clothing?: A Little Help from another person to put on and taking off regular lower body clothing?: A Little 6 Click Score: 18    End of Session Equipment Utilized During Treatment: Gait belt;Rolling walker (2 wheels)  OT Visit Diagnosis: Low vision, both eyes (H54.2);Unsteadiness on feet (R26.81);Other abnormalities of gait and mobility (R26.89);Other symptoms and signs involving cognitive function   Activity Tolerance Patient tolerated treatment well   Patient Left in bed;with call bell/phone within reach;with bed alarm set   Nurse Communication Mobility status        Time: 1441-1455 OT Time Calculation (min): 14 min  Charges: OT General Charges $OT Visit: 1 Visit OT Treatments $Self Care/Home Management : 8-22 mins  Larraine Colas M.S. OTR/L  02/29/24, 4:07 PM

## 2024-02-29 NOTE — ED Provider Notes (Signed)
 Emergency Medicine Observation Re-evaluation Note  Penny Hart is a 78 y.o. female, seen on rounds today.  Pt initially presented to the ED for complaints of Agitation  Currently, the patient is is no acute distress. Denies any concerns at this time.  Physical Exam  Blood pressure 122/76, pulse 61, temperature 98 F (36.7 C), temperature source Oral, resp. rate 18, height 5' 4 (1.626 m), weight 77.1 kg, SpO2 94%.  Physical Exam: General: No apparent distress Pulm: Normal WOB Neuro: Moving all extremities Psych: Resting comfortably     ED Course / MDM   I have reviewed the labs performed to date as well as medications administered while in observation.  Recent changes in the last 24 hours include: No acute events overnight.  Cleared by psychiatry.  Plan   Current plan: Patient awaiting placement, social work is helping to assist. Patient is not under full IVC at this time.    Suzanne Kirsch, MD 02/29/24 [phone]

## 2024-03-01 NOTE — ED Provider Notes (Signed)
-----------------------------------------   9:58 AM on 03/01/2024 -----------------------------------------   Blood pressure 117/79, pulse (!) 58, temperature 97.6 F (36.4 C), temperature source Oral, resp. rate 18, height 5' 4 (1.626 m), weight 77.1 kg, SpO2 95%.  The patient is calm and cooperative at this time.  There have been no acute events since the last update.  Awaiting disposition plan from case management/social work.    Refoel Palladino, Josette SAILOR, DO 03/01/24 [phone]

## 2024-03-01 NOTE — ED Notes (Signed)
 Pt out in hallway yelling. Pt was able to be redirected back to room.

## 2024-03-01 NOTE — ED Notes (Signed)
 This RN to bedside to introduce self to pt. Pt is currently asleep.

## 2024-03-01 NOTE — ED Notes (Signed)
 Walked into pts room, urine all over the floor. [place] EDT and this tech cleaned the urine off the floor and the pt. We then dressed the pt and got her set up for dinner.

## 2024-03-01 NOTE — ED Notes (Signed)
 VOL TOc

## 2024-03-02 NOTE — ED Notes (Signed)
 Pt breakfast Provided at bedside

## 2024-03-02 NOTE — ED Notes (Signed)
 Assisted pt to the toilet. Patient is back in bed. All rails up and call light at reach.

## 2024-03-02 NOTE — ED Notes (Signed)
Pt given ice cream per her request.   

## 2024-03-02 NOTE — ED Notes (Signed)
 Pt given graham crackers and Sprite per her request.

## 2024-03-03 NOTE — ED Notes (Signed)
 Pt given a lunch tray

## 2024-03-03 NOTE — ED Provider Notes (Signed)
 Emergency Medicine Observation Re-evaluation Note  Penny Hart is a 78 y.o. female, seen on rounds today.  Pt initially presented to the ED for complaints of Agitation Currently, the patient is resting.  Physical Exam  BP 117/77 (BP Location: Right Arm)   Pulse 60   Temp 98 F (36.7 C) (Oral)   Resp 18   Ht 5' 4 (1.626 m)   Wt 77.1 kg   SpO2 100%   BMI 29.18 kg/m  Physical Exam   ED Course / MDM    I have reviewed the labs performed to date as well as medications administered while in observation.  Recent changes in the last 24 hours include no acute events.  Plan  Current plan is for SW dispo.    Waymond Lorelle Cummins, MD 03/03/24 [phone]

## 2024-03-03 NOTE — ED Notes (Signed)
 Pt ambulated to in room toilet and back to bed independently, gait steady.  Pt given graham crackers and Sprite per her request.

## 2024-03-03 NOTE — ED Notes (Signed)
 Breakfast tray given.

## 2024-03-03 NOTE — ED Notes (Signed)
 Pt given a dinner tray

## 2024-03-03 NOTE — ED Notes (Signed)
 Dinner tray given

## 2024-03-04 NOTE — ED Notes (Signed)
 This tech woke patient to ask her if she needed to potty because she hasn't been in a couple hours due to pt sleeping. Pt woke up and went to potty and washed her hands then this tech helped her back into bed and fixed her covers.

## 2024-03-04 NOTE — ED Notes (Signed)
 Pt assisted to bedside commode

## 2024-03-04 NOTE — ED Notes (Signed)
 VOL/TOC Placement

## 2024-03-04 NOTE — Progress Notes (Signed)
 Occupational Therapy Re-evaluation  Patient Details Name: Penny Hart MRN: [id number] DOB: [birth year] Today's Date: 03/04/2024   History of present illness Pt is a 78 y.o. female with PMH that includes: CAD, COPD, type 2 diabetes, hypertension, diverticulosis, and dementia who presents with apparent aggressive behavior at her facility.  She relies on an IVC initiated by police.  Per report, patient was experiencing delusions of grandour stating that she owned the entire skilled nursing facility and when this was disagreed with threatened to kill other residents and herself.  MD assessment inlcudes homicidal ideation, dementia, and agitation.   OT comments  Chart reviewed to date, pt greeted in bed with daughter present, agreeable to OT tx session. Pt is oriented to self only, performs one step directions with increased cueing/time. Pt does require CGA for STS and amb with no AD. Toileting completed with supervision, Grooming standing at sink level with CGA. Pt is making progress towards goals, discharge recommendation remains appropriate. OT will follow.       If plan is discharge home, recommend the following:  A little help with walking and/or transfers;A little help with bathing/dressing/bathroom;Assistance with feeding;Assistance with cooking/housework;Direct supervision/assist for medications management;Direct supervision/assist for financial management;Assist for transportation;Help with stairs or ramp for entrance;Supervision due to cognitive status   Equipment Recommendations  None recommended by OT    Recommendations for Other Services      Precautions / Restrictions Precautions Precautions: Fall Recall of Precautions/Restrictions: Impaired       Mobility Bed Mobility Overal bed mobility: Modified Independent                  Transfers Overall transfer level: Needs assistance Equipment used: Rolling walker (2 wheels), None Transfers: Sit to/from Stand Sit to  Stand: Contact guard assist                 Balance Overall balance assessment: Needs assistance Sitting-balance support: No upper extremity supported, Feet supported Sitting balance-Leahy Scale: Good     Standing balance support: During functional activity, Bilateral upper extremity supported Standing balance-Leahy Scale: Fair                             ADL either performed or assessed with clinical judgement   ADL Overall ADL's : Needs assistance/impaired     Grooming: Wash/dry face;Wash/dry hands;Standing;Contact guard assist                   Toilet Transfer: Ambulation;Grab bars;Contact guard assist   Toileting- Clothing Manipulation and Hygiene: Contact guard assist;Sitting/lateral lean       Functional mobility during ADLs: Contact guard assist (amb approx 58' with no AD) General ADL Comments: frequent multi modal cueing throughout for cognition and visual impairments    Extremity/Trunk Assessment              Vision       Perception     Praxis     Communication Communication Communication: No apparent difficulties   Cognition Arousal: Alert Behavior During Therapy: WFL for tasks assessed/performed Cognition: History of cognitive impairments, Cognition impaired       Memory impairment (select all impairments): Short-term memory, Working [employer] fast streamer, Non-declarative long-term memory, Geneticist, molecular long-term memory Attention impairment (select first level of impairment): Focused attention Executive functioning impairment (select all impairments): Initiation, Sequencing, Reasoning, Problem solving OT - Cognition Comments: pt asking for her baby, does not know where her baby is  Following commands: Intact Following commands impaired: Follows one step commands with increased time, Follows multi-step commands inconsistently      Cueing   Cueing Techniques: Verbal cues, Tactile cues, Visual cues  Exercises Other  Exercises Other Exercises: edu re role of OT, role of rehab    Shoulder Instructions       General Comments      Pertinent Vitals/ Pain       Pain Assessment Pain Assessment: PAINAD Breathing: normal Negative Vocalization: none Facial Expression: smiling or inexpressive Body Language: relaxed Consolability: no need to console PAINAD Score: 0 Pain Intervention(s): Monitored during session  Home Living                                          Prior Functioning/Environment              Frequency  Min 2X/week        Progress Toward Goals  OT Goals(current goals can now be found in the care plan section)  Progress towards OT goals: Progressing toward goals  Acute Rehab OT Goals Time For Goal Achievement: 04/01/24  Plan      Co-evaluation                 AM-PAC OT 6 Clicks Daily Activity     Outcome Measure   Help from another person eating meals?: A Little Help from another person taking care of personal grooming?: A Little Help from another person toileting, which includes using toliet, bedpan, or urinal?: A Little Help from another person bathing (including washing, rinsing, drying)?: A Little Help from another person to put on and taking off regular upper body clothing?: A Little Help from another person to put on and taking off regular lower body clothing?: A Little 6 Click Score: 18    End of Session Equipment Utilized During Treatment: Rolling walker (2 wheels)  OT Visit Diagnosis: Low vision, both eyes (H54.2);Unsteadiness on feet (R26.81);Other abnormalities of gait and mobility (R26.89);Other symptoms and signs involving cognitive function   Activity Tolerance Patient tolerated treatment well   Patient Left in chair;with call bell/phone within reach;with chair alarm set   Nurse Communication Mobility status        Time: 9070-9056 OT Time Calculation (min): 14 min  Charges: OT General Charges $OT Visit: 1  Visit OT Evaluation $OT Re-eval: 1 Re-eval OT Treatments $Self Care/Home Management : 8-22 mins  Therisa Sheffield, OTD OTR/L  03/04/24, 12:36 PM

## 2024-03-04 NOTE — ED Notes (Signed)
 75% of meal tray consume. Daughter brought pt breakfast prior to meal tray being provided.

## 2024-03-04 NOTE — ED Notes (Signed)
 Breakfast tray provided. Pt sitting in recliner from PT/OT. Chair alarm in place. Call bell within reach

## 2024-03-05 DIAGNOSIS — F03918 Unspecified dementia, unspecified severity, with other behavioral disturbance: Secondary | ICD-10-CM | POA: Diagnosis not present

## 2024-03-05 MED ORDER — LAMOTRIGINE 100 MG PO TABS: 100.0000 mg | ORAL_TABLET | Freq: Two times a day (BID) | ORAL | Status: DC

## 2024-03-05 NOTE — TOC Progression Note (Addendum)
 Transition of Care [facility]) - Progression Note    Patient Details  Name: LATRENDA IRANI MRN: [id number] Date of Birth: 1945/12/19  Transition of Care [facility] CM/SW Contact  [place] L Arianni Gallego, [place] Phone Number: 03/05/2024, 8:40 AM  Clinical Narrative:      Bed offer received from [facility]. [facility] notified and asked how they'd like to proceed. Bed offer was accepted. Medical team notified.   CSW spoke with Delmer Chang, who advised that she was contacting the facility to start the paperwork.   Patient may be transferred by [facility] today to [facility].     11:06am: CSW met with Massie in the ED. Massie advised that patient was asleep during visit but confirmed that she can discharge to [facility] today. [facility] is completing paperwork in real time for placement.    12:52pm: Call received from [facility]. Facility is ready to accept patient today. Medical team notified and transport will be scheduled.             Expected Discharge Plan and Services                                               Social Drivers of Health (SDOH) Interventions SDOH Screenings   Housing: Unknown (08/01/2023)   Received from [facility]  Transportation Needs: No Transportation Needs (03/10/2023)  Tobacco Use: Medium Risk (02/18/2024)    Readmission Risk Interventions     No data to display

## 2024-03-05 NOTE — ED Notes (Signed)
 Report called to Harlene RN at [facility]

## 2024-03-05 NOTE — ED Notes (Signed)
 Pt in room eating, assisted with setup of tray

## 2024-03-05 NOTE — ED Notes (Signed)
 Patient lying in bed - calm and cooperative. Emotional support and reassurance given to patient after concern of people outside her door. Patient resting in bed and denies needs at this time.

## 2024-03-05 NOTE — ED Provider Notes (Signed)
 Emergency Medicine Observation Re-evaluation Note   BP 96/66   Pulse 62   Temp 98.3 F (36.8 C) (Oral)   Resp 14   Ht 5' 4 (1.626 m)   Wt 77.1 kg   SpO2 97%   BMI 29.18 kg/m    ED Course / MDM   No reported events during my shift at the time of this note.   Pt is awaiting dispo from consultants   Ginnie Shams MD    Shams Ginnie, MD 03/05/24 [phone]

## 2024-03-05 NOTE — Consult Note (Signed)
 [facility] Consult Follow up  Patient Name: .Penny Hart  MRN: [id number]  DOB: [birth year]  Consult Order details:  Orders (From admission, onward)     Start     Ordered   02/18/24 1445  IP CONSULT TO PSYCHIATRY       Ordering Provider: Nicholaus Rolland BRAVO, MD  Provider:  (Not yet assigned)  Question Answer Comment  Place call to: [phone]   Reason for Consult Admit   Diagnosis/Clinical Info for Consult: Geriatric psych, SI/HI, likely worsening dementia      02/18/24 1444   02/18/24 1220  CONSULT TO CALL ACT TEAM       Ordering Provider: Nicholaus Rolland BRAVO, MD  Provider:  (Not yet assigned)  Question:  Reason for Consult?  Answer:  Psych consult   02/18/24 1220             Mode of Visit: In person    Psychiatry Consult Evaluation  Service Date: March 05, 2024 LOS:  LOS: 0 days  Chief Complaint aggressive behaviors  Primary Psychiatric Diagnoses  dementia   Assessment  Penny Hart is a 78 y.o. female admitted: Presented to the ED   Penny Hart is a 77 y.o. female  CAD, COPD, type 2 diabetes, hypertension, diverticulosis, and dementia who presents with apparent aggressive behavior at her facility.  She relies on an IVC initiated by police.  Per report, patient was experiencing delusions of grandour stating that she owned the entire skilled nursing facility and when this was disagreed with threatened to kill other residents and herself.  She cannot be verbally redirected and she was subsequently sent here.    03/05/24: Assessment patient is noted to be awake, alert oriented to self only.  She is calm and cooperative.  She denies SI/HI/plan and is not responding to any internal stimuli.  Will continue the current management   Diagnoses:  Active Hospital problems: Active Problems:   * No active hospital problems. *    Plan   ## Psychiatric Medication Recommendations: Her agitation is in the context of dementia and the current environment of  being stationed in a close-up room with no windows, no time for clock in the room. Continue Seroquel  50 mg twice daily and 25 mg daily.  Will monitor for orthostatic hypotension.  Increase Lamictal  to 75 mg twice daily for further mood stabilization.  ## Medical Decision Making Capacity: Patient has a guardian and has thus been adjudicated incompetent; please involve patients guardian in medical decision making  ## Further Work-up:   -- most recent EKG on 02/16/2024 had QtC of 422 -- Pertinent labwork reviewed earlier this admission includes: CBC, CMP   ## Disposition:--No psychiatric contraindication for discharge  ## Behavioral / Environmental: -Utilize compassion and acknowledge the patient's experiences while setting clear and realistic expectations for care.    ## Safety and Observation Level:  - Based on my clinical evaluation, I estimate the patient to be at low risk of self harm in the current setting. - At this time, we recommend  routine. This decision is based on my review of the chart including patient's history and current presentation, interview of the patient, mental status examination, and consideration of suicide risk including evaluating suicidal ideation, plan, intent, suicidal or self-harm behaviors, risk factors, and protective factors. This judgment is based on our ability to directly address suicide risk, implement suicide prevention strategies, and develop a safety plan while the patient is in the clinical setting. Please contact  our team if there is a concern that risk level has changed.  CSSR Risk Category:C-SSRS RISK CATEGORY: No Risk  Suicide Risk Assessment: Patient has following modifiable risk factors for suicide: There is no risk of suicide Patient has following non-modifiable or demographic risk factors for suicide: no identified Patient has the following protective factors against suicide: Supportive family  Thank you for this consult request.  Recommendations have been communicated to the primary team.  We will follow-up as needed basis at this time.   [name], MD       History of Present Illness  Relevant Aspects of Hospital ED   03/05/2024: On interview patient is noted to be awake, alert and oriented to self.  She is very pleasant and engaged in the interview.  She is able to acknowledge she is in a hospital but is unable to come up with the name of the hospital or the president.  She is unable to identify the reason for her ED stay.  She denies SI/HI/plan and denies any hallucinations.  Per nursing staff patient is taking her medications with no problems.   Psychiatric and Social History  Psychiatric History:  Information collected from patient and chart  Prev Dx/Sx: denies Current Psych Provider: denies Home Meds (current): See chart Previous Med Trials: denies Therapy: denies  Prior Psych Hospitalization: denies  Prior Self Harm: denies Prior Violence: denies  Family Psych History: denies Family Hx suicide: denies  Social History:   Educational Hx: unknown Occupational Hx: retired Armed forces operational officer Hx: unknown Living Situation: SNF Spiritual Hx: unknown Access to weapons/lethal means: none   Substance History Alcohol: unknown  Type of alcohol unknown Last Drink unknown Number of drinks per day unknown History of alcohol withdrawal seizures unknown History of DT's unknown Tobacco: unknown Illicit drugs: unknown Prescription drug abuse: unknown Rehab hx: unknown  Exam Findings  Physical Exam: I agree with the initial exam Vital Signs:  Temp:  [98.3 F (36.8 C)] 98.3 F (36.8 C) (08/11 2118) Pulse Rate:  [49-62] 49 (08/12 0908) Resp:  [14-20] 14 (08/12 0337) BP: (96-103)/(66-71) 103/71 (08/12 0906) SpO2:  [97 %-98 %] 98 % (08/12 0908) Blood pressure 103/71, pulse (!) 49, temperature 98.3 F (36.8 C), temperature source Oral, resp. rate 14, height 5' 4 (1.626 m), weight 77.1 kg, SpO2 98%. Body mass  index is 29.18 kg/m.    Mental Status Exam: General Appearance: Neat  Orientation:  Other:  A&Ox1  Memory:  Immediate;   Poor Recent;   Poor Remote;   Poor  Concentration:  Concentration: Poor and Attention Span: Poor  Recall:  Poor  Attention  Fair  Eye Contact:  Good  Speech:  Normal Rate  Language:  Good  Volume:  Normal  Mood: fine  Affect:  Restricted  Thought Process:  Coherent  Thought Content:  Negative  Suicidal Thoughts:  No  Homicidal Thoughts:  No  Judgement:  Impaired  Insight:  impaired  Psychomotor Activity:  Normal  Akathisia:  No  Fund of Knowledge:  Poor      Assets:  Communication Skills  Cognition:  Impaired,  Moderate  ADL's:  Impaired  AIMS (if indicated):        Other History   These have been pulled in through the EMR, reviewed, and updated if appropriate.  Family History:  The patient's family history includes Diabetes in her mother.  Medical History: Past Medical History:  Diagnosis Date   Arthritis    CHF (congestive heart failure) (HCC)  COPD (chronic obstructive pulmonary disease) (HCC)    Coronary artery disease    Dementia (HCC)    Diabetes mellitus without complication (HCC)    GERD (gastroesophageal reflux disease)    Glaucoma    Headache    Hiatal hernia    Hyperlipidemia    Hypertension    Neuropathy    Obesity    Peripheral neuropathy    Pseudophakia    Hart apnea    Spinal stenosis    Spondylolisthesis of lumbar region    Thoracic aortic atherosclerosis [facility])     Surgical History: Past Surgical History:  Procedure Laterality Date   ABDOMINAL HYSTERECTOMY     BACK SURGERY  2019   [facility] [place]   BREAST EXCISIONAL BIOPSY Left yrs ago    benign   CATARACT EXTRACTION W/ INTRAOCULAR LENS  IMPLANT, BILATERAL     COLONOSCOPY N/A 06/01/2020   Procedure: COLONOSCOPY;  Surgeon: Maryruth Ole DASEN, MD;  Location: [facility] ENDOSCOPY;  Service: Endoscopy;  Laterality: N/A;   COLONOSCOPY WITH PROPOFOL  N/A 05/30/2015    Procedure: COLONOSCOPY WITH PROPOFOL ;  Surgeon: Rogelia Copping, MD;  Location: [facility] ENDOSCOPY;  Service: Endoscopy;  Laterality: N/A;   ESOPHAGOGASTRODUODENOSCOPY N/A 06/01/2020   Procedure: ESOPHAGOGASTRODUODENOSCOPY (EGD);  Surgeon: Maryruth Ole DASEN, MD;  Location: [facility] ENDOSCOPY;  Service: Endoscopy;  Laterality: N/A;   ESOPHAGOGASTRODUODENOSCOPY (EGD) WITH PROPOFOL  N/A 05/30/2015   Procedure: ESOPHAGOGASTRODUODENOSCOPY (EGD) WITH PROPOFOL ;  Surgeon: Rogelia Copping, MD;  Location: [facility] ENDOSCOPY;  Service: Endoscopy;  Laterality: N/A;   EYE SURGERY     Bialteral for glaucoma   Eye surgery for glaucoma     JOINT REPLACEMENT     Left   LEFT HEART CATH AND CORONARY ANGIOGRAPHY Left 01/30/2020   Procedure: LEFT HEART CATH AND CORONARY ANGIOGRAPHY;  Surgeon: Florencio Cara BIRCH, MD;  Location: [facility] INVASIVE CV LAB;  Service: Cardiovascular;  Laterality: Left;   Left total knee replacement     OOPHORECTOMY     PELVIC LAPAROSCOPY     POSTERIOR LUMBAR FUSION  04/27/2017   TUBAL LIGATION       Medications:   Current Facility-Administered Medications:    aspirin  EC tablet 81 mg, 81 mg, Oral, Daily, Bradler, Evan K, MD, 81 mg at 03/05/24 0906   dorzolamide -timolol  (COSOPT ) 2-0.5 % ophthalmic solution 1 drop, 1 drop, Left Eye, BID, Nicholaus Maize E, MD, 1 drop at 03/05/24 9092   isosorbide  mononitrate (IMDUR ) 24 hr tablet 30 mg, 30 mg, Oral, Daily, Nicholaus Maize E, MD, 30 mg at 03/05/24 9093   lamoTRIgine  (LAMICTAL ) tablet 100 mg, 100 mg, Oral, BID, Shamar Engelmann, MD   latanoprost  (XALATAN ) 0.005 % ophthalmic solution 1 drop, 1 drop, Both Eyes, QHS, Nicholaus Maize E, MD, 1 drop at 03/04/24 2116   metoprolol  succinate (TOPROL -XL) 24 hr tablet 25 mg, 25 mg, Oral, Daily, Nicholaus Maize E, MD, 25 mg at 03/04/24 0944   mirtazapine  (REMERON ) tablet 30 mg, 30 mg, Oral, QHS, Nicholaus Maize E, MD, 30 mg at 03/04/24 2115   potassium chloride  (KLOR-CON ) packet 20 mEq, 20 mEq, Oral, Daily, Claudene Rover, MD,  20 mEq at 03/05/24 9093   pravastatin  (PRAVACHOL ) tablet 10 mg, 10 mg, Oral, QPM, Nicholaus Maize E, MD, 10 mg at 03/04/24 1831   QUEtiapine  (SEROQUEL ) tablet 25 mg, 25 mg, Oral, Q1500, Kinner, Robert, MD, 25 mg at 03/04/24 1831   QUEtiapine  (SEROQUEL ) tablet 50 mg, 50 mg, Oral, BID, Kinner, Robert, MD, 50 mg at 03/05/24 0907   torsemide  (DEMADEX ) tablet 20 mg, 20  mg, Oral, Daily, Nicholaus Maize E, MD, 20 mg at 03/05/24 9093  Current Outpatient Medications:    aspirin  EC 81 MG tablet, Take 1 tablet (81 mg total) by mouth daily. Swallow whole., Disp: 30 tablet, Rfl: 2   Dorzolamide  HCl-Timolol  Mal PF 2-0.5 % SOLN, Place 1 drop into the left eye 2 (two) times daily., Disp: , Rfl:    isosorbide  mononitrate (IMDUR ) 30 MG 24 hr tablet, Take 1 tablet (30 mg total) by mouth daily., Disp: 30 tablet, Rfl: 2   lamoTRIgine  (LAMICTAL ) 25 MG tablet, Take 2 tablets (50 mg total) by mouth 2 (two) times daily. (Patient taking differently: Take 75 mg by mouth 2 (two) times daily.), Disp: 120 tablet, Rfl: 2   latanoprost  (XALATAN ) 0.005 % ophthalmic solution, Place 1 drop into both eyes at bedtime., Disp: 1.5 mL, Rfl: 11   LORazepam  (ATIVAN ) 0.5 MG tablet, Take 0.5 mg by mouth every 6 (six) hours as needed for anxiety., Disp: , Rfl:    metoprolol  succinate (TOPROL -XL) 25 MG 24 hr tablet, Take 1 tablet (25 mg total) by mouth daily., Disp: 30 tablet, Rfl: 2   mirtazapine  (REMERON ) 15 MG tablet, Take 1 tablet (15 mg total) by mouth at bedtime. (Patient taking differently: Take 30 mg by mouth at bedtime.), Disp: 30 tablet, Rfl: 0   potassium chloride  (KLOR-CON  M) 10 MEQ tablet, Take 1 tablet (10 mEq total) by mouth 2 (two) times daily., Disp: 60 tablet, Rfl: 2   pravastatin  (PRAVACHOL ) 10 MG tablet, Take 1 tablet (10 mg total) by mouth every evening., Disp: 30 tablet, Rfl: 2   QUEtiapine  (SEROQUEL ) 25 MG tablet, Take 1 tablet (25 mg total) by mouth daily in the afternoon. 3 pm, Disp: 30 tablet, Rfl: 1   QUEtiapine   (SEROQUEL ) 50 MG tablet, Take 1 tablet (50 mg total) by mouth 2 (two) times daily., Disp: 60 tablet, Rfl: 1   torsemide  (DEMADEX ) 20 MG tablet, Take 1 tablet (20 mg total) by mouth daily., Disp: 30 tablet, Rfl: 2   potassium chloride  (KLOR-CON ) 10 MEQ tablet, Take 10 mEq by mouth 2 (two) times daily., Disp: , Rfl:   Allergies: Allergies  Allergen Reactions   Oxycodone -Acetaminophen  Rash    UNSPECIFIED REACTION     Tramadol Rash and Other (See Comments)    INTOLERANCE > Chest pain Other reaction(s): Other (See Comments) Chest pain    Allyn Foil, MD

## 2024-03-05 NOTE — ED Notes (Signed)
 FALL RISK BUNDLE IN PLACE

## 2024-03-05 NOTE — ED Notes (Signed)
 [facility] arrived to transport

## 2024-03-05 NOTE — ED Provider Notes (Signed)
 Emergency Medicine Observation Re-evaluation Note  Penny Hart is a 78 y.o. female, seen on rounds today.  Pt initially presented to the ED for complaints of Agitation  Currently, the patient is is no acute distress. Denies any concerns at this time.  Physical Exam  Blood pressure 103/71, pulse (!) 49, temperature 98.3 F (36.8 C), temperature source Oral, resp. rate 14, height 5' 4 (1.626 m), weight 77.1 kg, SpO2 98%.  Physical Exam: General: No apparent distress Pulm: Normal WOB Neuro: Moving all extremities Psych: Resting comfortably     ED Course / MDM   Clinical Course as of 03/05/24 1002  Sun Feb 18, 2024  1443 Urinalysis, Complete w Microscopic -Urine, Clean Catch(!) Not consistent with UTI.  I will medically clear the patient [HD]    Clinical Course User Index [HD] Nicholaus Rolland BRAVO, MD    I have reviewed the labs performed to date as well as medications administered while in observation.  Recent changes in the last 24 hours include: No acute events overnight.  Plan   Current plan: Patient awaiting placement  Reported that patient was accepted to [facility].  Will discharge the patient.  Asked if the patient need any prescriptions and they stated that they would fill from her discharge information.    Suzanne Kirsch, MD 03/05/24 1313

## 2024-03-05 NOTE — ED Notes (Signed)
 Pt ambulated with assistance to the toilet. This tech and [place] EDT assisted Pt to recliner and set her up for breakfast. Pt socks were also changed. Chair alarm is turned on.

## 2024-03-05 NOTE — ED Notes (Signed)
 [facility]  called  for  transport  to  [facility]

## 2024-03-07 ENCOUNTER — Other Ambulatory Visit: Payer: Self-pay

## 2024-03-07 ENCOUNTER — Encounter: Payer: Self-pay | Admitting: Emergency Medicine

## 2024-03-07 ENCOUNTER — Emergency Department
Admission: EM | Admit: 2024-03-07 | Discharge: 2024-03-07 | Disposition: A | Discharge status: Skilled Nursing Facility | Attending: Emergency Medicine | Admitting: Emergency Medicine

## 2024-03-07 DIAGNOSIS — Z96652 Presence of left artificial knee joint: Secondary | ICD-10-CM | POA: Diagnosis not present

## 2024-03-07 DIAGNOSIS — E119 Type 2 diabetes mellitus without complications: Secondary | ICD-10-CM | POA: Diagnosis not present

## 2024-03-07 DIAGNOSIS — F03918 Unspecified dementia, unspecified severity, with other behavioral disturbance: Secondary | ICD-10-CM | POA: Insufficient documentation

## 2024-03-07 DIAGNOSIS — I509 Heart failure, unspecified: Secondary | ICD-10-CM | POA: Insufficient documentation

## 2024-03-07 DIAGNOSIS — R4182 Altered mental status, unspecified: Secondary | ICD-10-CM | POA: Diagnosis present

## 2024-03-07 DIAGNOSIS — I2510 Atherosclerotic heart disease of native coronary artery without angina pectoris: Secondary | ICD-10-CM | POA: Insufficient documentation

## 2024-03-07 DIAGNOSIS — J449 Chronic obstructive pulmonary disease, unspecified: Secondary | ICD-10-CM | POA: Diagnosis not present

## 2024-03-07 DIAGNOSIS — I10 Essential (primary) hypertension: Secondary | ICD-10-CM | POA: Insufficient documentation

## 2024-03-07 LAB — URINE DRUG SCREEN, QUALITATIVE (ARMC ONLY)
Amphetamines, Ur Screen: NOT DETECTED
Barbiturates, Ur Screen: NOT DETECTED
Benzodiazepine, Ur Scrn: NOT DETECTED
Cannabinoid 50 Ng, Ur ~~LOC~~: NOT DETECTED
Cocaine Metabolite,Ur ~~LOC~~: NOT DETECTED
MDMA (Ecstasy)Ur Screen: NOT DETECTED
Methadone Scn, Ur: NOT DETECTED
Opiate, Ur Screen: NOT DETECTED
Phencyclidine (PCP) Ur S: NOT DETECTED
Tricyclic, Ur Screen: POSITIVE — AB

## 2024-03-07 LAB — URINALYSIS, ROUTINE W REFLEX MICROSCOPIC
Bilirubin Urine: NEGATIVE
Glucose, UA: NEGATIVE mg/dL
Hgb urine dipstick: NEGATIVE
Ketones, ur: NEGATIVE mg/dL
Leukocytes,Ua: NEGATIVE
Nitrite: NEGATIVE
Protein, ur: NEGATIVE mg/dL
Specific Gravity, Urine: 1.017 (ref 1.005–1.030)
pH: 6 (ref 5.0–8.0)

## 2024-03-07 LAB — CBC WITH DIFFERENTIAL/PLATELET
Abs Immature Granulocytes: 0.01 K/uL (ref 0.00–0.07)
Basophils Absolute: 0.1 K/uL (ref 0.0–0.1)
Basophils Relative: 1 %
Eosinophils Absolute: 0.1 K/uL (ref 0.0–0.5)
Eosinophils Relative: 2 %
HCT: 42.1 % (ref 36.0–46.0)
Hemoglobin: 13.3 g/dL (ref 12.0–15.0)
Immature Granulocytes: 0 %
Lymphocytes Relative: 34 %
Lymphs Abs: 2.5 K/uL (ref 0.7–4.0)
MCH: 28.1 pg (ref 26.0–34.0)
MCHC: 31.6 g/dL (ref 30.0–36.0)
MCV: 88.8 fL (ref 80.0–100.0)
Monocytes Absolute: 0.9 K/uL (ref 0.1–1.0)
Monocytes Relative: 13 %
Neutro Abs: 3.7 K/uL (ref 1.7–7.7)
Neutrophils Relative %: 50 %
Platelets: 255 K/uL (ref 150–400)
RBC: 4.74 MIL/uL (ref 3.87–5.11)
RDW: 14.8 % (ref 11.5–15.5)
WBC: 7.2 K/uL (ref 4.0–10.5)
nRBC: 0 % (ref 0.0–0.2)

## 2024-03-07 LAB — COMPREHENSIVE METABOLIC PANEL WITH GFR
ALT: 31 U/L (ref 0–44)
AST: 33 U/L (ref 15–41)
Albumin: 3.7 g/dL (ref 3.5–5.0)
Alkaline Phosphatase: 65 U/L (ref 38–126)
Anion gap: 12 (ref 5–15)
BUN: 15 mg/dL (ref 8–23)
CO2: 25 mmol/L (ref 22–32)
Calcium: 9.7 mg/dL (ref 8.9–10.3)
Chloride: 102 mmol/L (ref 98–111)
Creatinine, Ser: 1.06 mg/dL — ABNORMAL HIGH (ref 0.44–1.00)
GFR, Estimated: 54 mL/min — ABNORMAL LOW (ref >60)
Glucose, Bld: 113 mg/dL — ABNORMAL HIGH (ref 70–99)
Potassium: 3.9 mmol/L (ref 3.5–5.1)
Sodium: 139 mmol/L (ref 135–145)
Total Bilirubin: 0.7 mg/dL (ref 0.0–1.2)
Total Protein: 7.7 g/dL (ref 6.5–8.1)

## 2024-03-07 LAB — ETHANOL: Alcohol, Ethyl (B): <15 mg/dL (ref <15)

## 2024-03-07 NOTE — ED Triage Notes (Addendum)
 Patient BIB [facility] from [facility] after being there for 1 day. Staff reports that patient has been aggressive, and threatening staff and they want her evaluated.  Patient upset upon arrival believing that EMS kidnapped her.  Patient cooperative with staff here.

## 2024-03-07 NOTE — ED Notes (Signed)
 IVC prior to arrival/Psych Consult Ordered/Legal paper work completed scanned & E-filed into [employer]

## 2024-03-07 NOTE — ED Notes (Addendum)
 Pt breakfast provided at bedside

## 2024-03-07 NOTE — ED Notes (Signed)
 [facility] arrived for pt transport back to [facility]

## 2024-03-07 NOTE — ED Notes (Signed)
 Rescinded IVC by Dr. [place]

## 2024-03-07 NOTE — ED Notes (Signed)
 [facility] spoke to the [facility] of [employer] [phone]).  Eli confirmed patient can return to facility via EMS.  Patient's RN [place]) was updated.  [place], [facility] [phone]

## 2024-03-07 NOTE — ED Notes (Signed)
 Patient has removed all monitoring cords, attempted to put them back on, patient became agitated.  Will get vital signs PRN at this time.

## 2024-03-07 NOTE — Discharge Instructions (Addendum)
 Your testing in the ER was overall reassuring.  You have been cleared medically and by our psychiatric team.  Follow-up with your primary care doctor for further evaluation.  Return to the ER for new or worsening symptoms.

## 2024-03-07 NOTE — ED Provider Notes (Signed)
 Notified by RN that patient had been seen and cleared for discharge by psychiatry.  Behaviors thought to be at patient's baseline.  Facility has been contacted and is comfortable with discharge.  I did note that patient's IVC has not yet been rescinded.  Dr. Layne with psychiatry was consulted who reports that she will rescind the patient's IVC.  Plan for discharge following this.   Levander Slate, MD 03/07/24 [phone]

## 2024-03-07 NOTE — ED Notes (Signed)
 Pt resting in bed. No distress. Will continue to monitor.

## 2024-03-07 NOTE — Consult Note (Signed)
 [facility] Psychiatric Consult Initial  Patient Name: .Penny Hart  MRN: [id number]  DOB: 09/15/1945  Consult Order details:  Orders (From admission, onward)     Start     Ordered   03/07/24 0408  CONSULT TO CALL ACT TEAM       Ordering Provider: Gordan Huxley, MD  Provider:  (Not yet assigned)  Question:  Reason for Consult?  Answer:  Psych consult   03/07/24 0407   03/07/24 0407  IP CONSULT TO PSYCHIATRY       Ordering Provider: Gordan Huxley, MD  Provider:  (Not yet assigned)  Question Answer Comment  Consult Timeframe URGENT - requires response within 12 hours   URGENT timeframe requires provider to provider communication, has the provider to provider communication been completed Yes   Reason for Consult? aggressive behavior   Contact phone number where the requesting provider can be reached [phone]      03/07/24 0407             Mode of Visit: Tele-visit Virtual Statement:TELE PSYCHIATRY ATTESTATION & CONSENT As the provider for this telehealth consult, I attest that I verified the patient's identity using two separate identifiers, introduced myself to the patient, provided my credentials, disclosed my location, and performed this encounter via a HIPAA-compliant, real-time, face-to-face, two-way, interactive audio and video platform and with the full consent and agreement of the patient (or guardian as applicable.) Patient physical location: [facility]. Telehealth provider physical location: home office in state of [place] .   Video start time:   Video end time:      Psychiatry Consult Evaluation  Service Date: March 07, 2024 LOS:  LOS: 0 days  Chief Complaint Aggressive Behavior  Primary Psychiatric Diagnoses  Dementia  Assessment  Penny Hart is a 78 y.o. female admitted: Presented to the ED for 03/07/2024  1:08 AM for for evaluation of aggressive behavior in the context of advanced dementia. She carries the psychiatric diagnosis of  major neurocognitive disorder has a past medical history significant for dementia.  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   Current outpatient psychotropic medications are unknown, and historically her response to such medications is unclear. She was likely compliant with prescribed medications prior to admission as patient medication is managed by the facility. On initial examination, patient is alert but disoriented, with scattered thought process and no acute psychiatric distress beyond behavioral dysregulation reported by staff.  Diagnoses:  Active Hospital problems: Active Problems:   * No active hospital problems. *    Plan   ## Psychiatric Medication Recommendations:  none  ## Medical Decision Making Capacity: Patient has a guardian and has thus been adjudicated incompetent; please involve patients guardian in medical decision making   ## Disposition:-- There are no psychiatric contraindications to discharge at this time  ## Behavioral / Environmental: - No specific recommendations at this time.     ## Safety and Observation Level:  - Based on my clinical evaluation, I estimate the patient to be at no risk of self harm in the current setting. - At this time, we recommend  routine. This decision is based on my review of the chart including patient's history and current presentation, interview of the patient, mental status examination, and consideration of suicide risk including evaluating suicidal ideation, plan,  intent, suicidal or self-harm behaviors, risk factors, and protective factors. This judgment is based on our ability to directly address suicide risk, implement suicide prevention strategies, and develop a safety plan while the patient is  in the clinical setting. Please contact our team if there is a concern that risk level has changed.  CSSR Risk Category:C-SSRS RISK CATEGORY: No Risk  Suicide Risk Assessment: Patient has following modifiable risk factors for suicide: triggering events, which we are addressing by education. Patient has following non-modifiable or demographic risk factors for suicide: none Patient has the following protective factors against suicide: Supportive family  Thank you for this consult request. Recommendations have been communicated to the primary team.  We will not recommend inpatient at this time. Patient does not meet admission criteria.  Cariann Kinnamon, NP       History of Present Illness  Relevant Aspects of Hospital ED Course:  Admitted on 03/07/2024 under IVC for aggressive behavior.  Patient Report:  Penny Hart. Penny Hart is a 78 year old female with a history of dementia who was brought in by EMS from her facility under involuntary commitment (IVC) for aggressive behavior. Collateral history from facility staff indicates that she has been verbally and physically aggressive toward staff and other residents throughout the day. Patient denies these behaviors and also does not recall events from earlier today. She was recently placed at the facility 1-2 days ago. She is unable to provide reliable history due to cognitive impairment and scattered thought processes. She denies suicidal ideation (SI), homicidal ideation (HI), and auditory/visual hallucinations (AVH).  Psych ROS:  Depression: no Anxiety:  no Mania (lifetime and current): no Psychosis: (lifetime and current): no   Review of Systems  Constitutional: Negative.   HENT: Negative.    Eyes: Negative.   Respiratory: Negative.    Cardiovascular: Negative.   Gastrointestinal: Negative.   Genitourinary: Negative.   Musculoskeletal: Negative.   Skin: Negative.   Neurological: Negative.      Psychiatric and Social History   Psychiatric History:  Information collected from Patient History  Prev Dx/Sx: dementia Current Psych Provider: unknown Home Meds (current): unknown Previous Med Trials: unknown Therapy: unknown  Prior Psych Hospitalization: unknown  Prior Self Harm: unknown Prior Violence: unknown  Family Psych History: unknown Family Hx suicide: unknown  Social History:  Developmental Hx: unknown Educational Hx: unknown Occupational Hx: unknown Legal Hx: unknown Living Situation: Facility Spiritual Hx: unknown Access to weapons/lethal means: no   Substance History Alcohol: unable to access  Type of alcohol unable to access  Last Drink unable to access  Number of drinks per day unable to access  History of alcohol withdrawal seizures unable to access  History of DT's unable to access  Tobacco: unable to access  Illicit drugs: unable to access  Prescription drug abuse: unable to access  Rehab hx: unable to access   Exam Findings   Vital Signs:  Temp:  [98.3 F (36.8 C)] 98.3 F (36.8 C) (08/14 0115) Pulse Rate:  [58-71] 71 (08/14 0300) Resp:  [11-20] 11 (08/14 0300) BP: (93-123)/(56-84) 121/72 (08/14 0300) SpO2:  [95 %-98 %] 98 % (08/14 0300) Weight:  [77 kg] 77 kg (08/14 0115) Blood pressure 121/72, pulse 71, temperature 98.3 F (36.8 C), resp. rate 11, weight 77 kg, SpO2 98%. Body mass index is 29.14 kg/m.  Physical Exam HENT:     Head: Normocephalic.     Nose: Nose normal.     Mouth/Throat:     Pharynx: Oropharynx is clear.  Eyes:     Extraocular Movements: Extraocular movements intact.  Pulmonary:     Effort: Pulmonary effort is normal.  Musculoskeletal:     Cervical back: Normal range of motion.  Skin:    General: Skin is dry.  Neurological:     Mental Status: She is alert.         Other History   These have been pulled in through the EMR, reviewed, and updated if appropriate.  Family History:  The patient's family history includes Diabetes in her  mother.  Medical History: Past Medical History:  Diagnosis Date   Arthritis    CHF (congestive heart failure) (HCC)    COPD (chronic obstructive pulmonary disease) (HCC)    Coronary artery disease    Dementia (HCC)    Diabetes mellitus without complication (HCC)    GERD (gastroesophageal reflux disease)    Glaucoma    Headache    Hiatal hernia    Hyperlipidemia    Hypertension    Neuropathy    Obesity    Peripheral neuropathy    Pseudophakia    Sleep apnea    Spinal stenosis    Spondylolisthesis of lumbar region    Thoracic aortic atherosclerosis [facility])     Surgical History: Past Surgical History:  Procedure Laterality Date   ABDOMINAL HYSTERECTOMY     BACK SURGERY  2019   [facility] [place]   BREAST EXCISIONAL BIOPSY Left yrs ago    benign   CATARACT EXTRACTION W/ INTRAOCULAR LENS  IMPLANT, BILATERAL     COLONOSCOPY N/A 06/01/2020   Procedure: COLONOSCOPY;  Surgeon: Maryruth Ole DASEN, MD;  Location: [facility] ENDOSCOPY;  Service: Endoscopy;  Laterality: N/A;   COLONOSCOPY WITH PROPOFOL  N/A 05/30/2015   Procedure: COLONOSCOPY WITH PROPOFOL ;  Surgeon: Rogelia Copping, MD;  Location: [facility] ENDOSCOPY;  Service: Endoscopy;  Laterality: N/A;   ESOPHAGOGASTRODUODENOSCOPY N/A 06/01/2020   Procedure: ESOPHAGOGASTRODUODENOSCOPY (EGD);  Surgeon: Maryruth Ole DASEN, MD;  Location: [facility] ENDOSCOPY;  Service: Endoscopy;  Laterality: N/A;   ESOPHAGOGASTRODUODENOSCOPY (EGD) WITH PROPOFOL  N/A 05/30/2015   Procedure: ESOPHAGOGASTRODUODENOSCOPY (EGD) WITH PROPOFOL ;  Surgeon: Rogelia Copping, MD;  Location: [facility] ENDOSCOPY;  Service: Endoscopy;  Laterality: N/A;   EYE SURGERY     Bialteral for glaucoma   Eye surgery for glaucoma     JOINT REPLACEMENT     Left   LEFT HEART CATH AND CORONARY ANGIOGRAPHY Left 01/30/2020   Procedure: LEFT HEART CATH AND CORONARY ANGIOGRAPHY;  Surgeon: Florencio Cara BIRCH, MD;  Location: [facility] INVASIVE CV LAB;  Service: Cardiovascular;  Laterality: Left;   Left total knee  replacement     OOPHORECTOMY     PELVIC LAPAROSCOPY     POSTERIOR LUMBAR FUSION  04/27/2017   TUBAL LIGATION       Medications:  No current facility-administered medications for this encounter.  Current Outpatient Medications:    aspirin  EC 81 MG tablet, Take 1 tablet (81 mg total) by mouth daily. Swallow whole., Disp: 30 tablet, Rfl: 2   Dorzolamide  HCl-Timolol  Mal PF 2-0.5 % SOLN, Place 1 drop into the left eye 2 (two) times daily., Disp: , Rfl:    isosorbide  mononitrate (IMDUR ) 30 MG 24 hr tablet, Take 1 tablet (30 mg total) by mouth daily., Disp: 30 tablet, Rfl: 2   lamoTRIgine  (LAMICTAL ) 25 MG tablet, Take 2 tablets (50 mg total) by mouth 2 (two) times daily. (Patient taking differently: Take 75 mg by mouth 2 (two) times daily.), Disp: 120 tablet, Rfl: 2   latanoprost  (XALATAN ) 0.005 %  ophthalmic solution, Place 1 drop into both eyes at bedtime., Disp: 1.5 mL, Rfl: 11   LORazepam  (ATIVAN ) 0.5 MG tablet, Take 0.5 mg by mouth every 6 (six) hours as needed for anxiety., Disp: , Rfl:    metoprolol  succinate (TOPROL -XL) 25 MG 24 hr tablet, Take 1 tablet (25 mg total) by mouth daily., Disp: 30 tablet, Rfl: 2   mirtazapine  (REMERON ) 15 MG tablet, Take 1 tablet (15 mg total) by mouth at bedtime. (Patient taking differently: Take 30 mg by mouth at bedtime.), Disp: 30 tablet, Rfl: 0   potassium chloride  (KLOR-CON  M) 10 MEQ tablet, Take 1 tablet (10 mEq total) by mouth 2 (two) times daily., Disp: 60 tablet, Rfl: 2   potassium chloride  (KLOR-CON ) 10 MEQ tablet, Take 10 mEq by mouth 2 (two) times daily., Disp: , Rfl:    pravastatin  (PRAVACHOL ) 10 MG tablet, Take 1 tablet (10 mg total) by mouth every evening., Disp: 30 tablet, Rfl: 2   QUEtiapine  (SEROQUEL ) 25 MG tablet, Take 1 tablet (25 mg total) by mouth daily in the afternoon. 3 pm, Disp: 30 tablet, Rfl: 1   QUEtiapine  (SEROQUEL ) 50 MG tablet, Take 1 tablet (50 mg total) by mouth 2 (two) times daily., Disp: 60 tablet, Rfl: 1   torsemide  (DEMADEX )  20 MG tablet, Take 1 tablet (20 mg total) by mouth daily., Disp: 30 tablet, Rfl: 2  Allergies: Allergies  Allergen Reactions   Oxycodone -Acetaminophen  Rash    UNSPECIFIED REACTION     Tramadol Rash and Other (See Comments)    INTOLERANCE > Chest pain Other reaction(s): Other (See Comments) Chest pain    Christinea Brizuela, NP

## 2024-03-07 NOTE — ED Notes (Addendum)
 Legal Guardian: Nanetta Cable with [employer] [phone] called and no voice mail could be left due to it being full.   After hours on-call social worker Lauraine called and provided update on pts arrival to ED and reason facility sent her out.

## 2024-03-07 NOTE — ED Notes (Signed)
 Called [facility] for transport to [facility]  spoke to [facility]

## 2024-03-07 NOTE — ED Provider Notes (Signed)
 [facility] Provider Note    Event Date/Time   First MD Initiated Contact with Patient 03/07/24 0113     (approximate)   History   Altered Mental Status   HPI Penny Hart is a 78 y.o. female who comes by EMS from her facility for aggressive behavior.  History is very limited but apparently she has been verbally and physically abusive and aggressive with staff and other residents at the facility all day today.  She denies all of this but also does not remember anything that has happened.  She was just placed at the facility 1 to 2 days ago.  She takes a while to be redirected but denies pain and shortness of breath.  She is very upset because she feels that the paramedics woke her up and dragged her out of her bed and brought her here against her will.  However, her behavior was significant enough that the law enforcement officers on the scene took out involuntary commitment papers against her.     Physical Exam   Triage Vital Signs: ED Triage Vitals [03/07/24 0115]  Encounter Vitals Group     BP 121/84     Girls Systolic BP Percentile      Girls Diastolic BP Percentile      Boys Systolic BP Percentile      Boys Diastolic BP Percentile      Pulse Rate 64     Resp 20     Temp 98.3 F (36.8 C)     Temp src      SpO2 98 %     Weight 77 kg (169 lb 12.1 oz)     Height      Head Circumference      Peak Flow      Pain Score 0     Pain Loc      Pain Education      Exclude from Growth Chart     Most recent vital signs: Vitals:   03/07/24 0230 03/07/24 0300  BP: 123/77 121/72  Pulse: 71 71  Resp: 16 11  Temp:    SpO2: 97% 98%    General: Awake, alert, very bad but redirectable. CV:  Good peripheral perfusion.  Regular rate and rhythm. Resp:  Normal effort. Speaking easily and comfortably, no accessory muscle usage nor intercostal retractions.  Lungs are clear to auscultation bilaterally. Abd:  No distention.  No tenderness to palpation of  the abdomen. Other:  Patient is clearly confused but apparently this is her baseline.  She is very angry at being brought into the emergency department but clearly is confabulating and it is unclear what exactly she thinks is happening.  She is not expressing an overt desire to harm herself or anyone else but she is very upset and requires frequent verbal de-escalation.   ED Results / Procedures / Treatments   Labs (all labs ordered are listed, but only abnormal results are displayed) Labs Reviewed  COMPREHENSIVE METABOLIC PANEL WITH GFR - Abnormal; Notable for the following components:      Result Value   Glucose, Bld 113 (*)    Creatinine, Ser 1.06 (*)    GFR, Estimated 54 (*)    All other components within normal limits  URINE DRUG SCREEN, QUALITATIVE ([facility] ONLY) - Abnormal; Notable for the following components:   Tricyclic, Ur Screen POSITIVE (*)    All other components within normal limits  URINALYSIS, ROUTINE W REFLEX MICROSCOPIC - Abnormal; Notable for the  following components:   Color, Urine YELLOW (*)    APPearance CLEAR (*)    All other components within normal limits  ETHANOL  CBC WITH DIFFERENTIAL/PLATELET     EKG  ED ECG REPORT I, Darleene Dome, the attending physician, personally viewed and interpreted this ECG.  Date: 03/07/2024 EKG Time: 1:27 AM Rate: 59 Rhythm: Sinus, generally unremarkable QRS Axis: normal Intervals: Prolonged QTc interval at 528 ms ST/T Wave abnormalities: normal Narrative Interpretation: no evidence of acute ischemia      PROCEDURES:  Critical Care performed: No  Procedures    IMPRESSION / MDM / ASSESSMENT AND PLAN / ED COURSE  I reviewed the triage vital signs and the nursing notes.                              Differential diagnosis includes, but is not limited to, dementia, nonspecific agitation, delirium, acute infectious process, behavioral disturbance  Patient's presentation is most consistent with acute  presentation with potential threat to life or bodily function.  Labs/studies ordered: As per protocol, I ordered the following labs as part of the patient's medical and psychiatric evaluation:  CBC, CMP, ethanol level, urine drug screen.  Interventions/Medications given:  Medications - No data to display  (Note:  hospital course my include additional interventions and/or labs/studies not listed above.)   Stable vitals, patient seems to be at her baseline based on her other recent visits to the emergency department.  However her behavior is such that it apparently cannot be managed at her facility.  She is under involuntary commitment from law enforcement for her behavior at the facility and I will uphold that for now.  There is no indication of an emergent or acute medical condition.  Labs reassuring, no evidence of UTI.  The patient has been placed in psychiatric observation due to the need to provide a safe environment for the patient while obtaining psychiatric consultation and evaluation, as well as ongoing medical and medication management to treat the patient's condition.  The patient has been placed under full IVC at this time.       FINAL CLINICAL IMPRESSION(S) / ED DIAGNOSES   Final diagnoses:  Aggressive behavior due to dementia [facility])     Rx / DC Orders   ED Discharge Orders     None        Note:  This document was prepared using Dragon voice recognition software and may include unintentional dictation errors.   Dome Darleene, MD 03/07/24 [phone]

## 2024-03-07 NOTE — ED Notes (Signed)
 Patient pulling monitoring cords off and playing with them.  Patient calm at this time and cooperative with staff.

## 2024-03-07 NOTE — ED Notes (Signed)
 Patient remains confused, asking for [name].  Patient redirected to room and TV turned on per request.  Patient lying in bed, quiet, watching TV.
